# Patient Record
Sex: Female | Born: 1960 | Race: Black or African American | Hispanic: No | Marital: Single | Smoking: Former smoker
Health system: Southern US, Community
[De-identification: ages and names within clinical notes are randomized; demographics above are authoritative.]

## PROBLEM LIST (undated history)

## (undated) DIAGNOSIS — N1330 Unspecified hydronephrosis: Secondary | ICD-10-CM

## (undated) DIAGNOSIS — T1490XA Injury, unspecified, initial encounter: Secondary | ICD-10-CM

## (undated) DIAGNOSIS — I10 Essential (primary) hypertension: Secondary | ICD-10-CM

## (undated) DIAGNOSIS — N182 Chronic kidney disease, stage 2 (mild): Secondary | ICD-10-CM

## (undated) DIAGNOSIS — N179 Acute kidney failure, unspecified: Secondary | ICD-10-CM

## (undated) DIAGNOSIS — E785 Hyperlipidemia, unspecified: Secondary | ICD-10-CM

## (undated) DIAGNOSIS — E119 Type 2 diabetes mellitus without complications: Secondary | ICD-10-CM

## (undated) DIAGNOSIS — S81801A Unspecified open wound, right lower leg, initial encounter: Secondary | ICD-10-CM

## (undated) DIAGNOSIS — N183 Chronic kidney disease, stage 3 unspecified: Secondary | ICD-10-CM

## (undated) HISTORY — DX: Acute kidney failure, unspecified: N17.9

## (undated) HISTORY — DX: Type 2 diabetes mellitus without complications: E11.9

## (undated) HISTORY — DX: Essential (primary) hypertension: I10

## (undated) HISTORY — DX: Chronic kidney disease, stage 2 (mild): N18.2

## (undated) HISTORY — DX: Hyperlipidemia, unspecified: E78.5

## (undated) HISTORY — PX: TUBAL LIGATION: SHX77

---

## 1992-06-01 DIAGNOSIS — E1110 Type 2 diabetes mellitus with ketoacidosis without coma: Secondary | ICD-10-CM | POA: Insufficient documentation

## 1997-06-25 ENCOUNTER — Emergency Department (HOSPITAL_COMMUNITY): Admission: EM | Admit: 1997-06-25 | Discharge: 1997-06-25 | Payer: Self-pay | Admitting: Emergency Medicine

## 1997-07-25 ENCOUNTER — Encounter: Admission: RE | Admit: 1997-07-25 | Discharge: 1997-07-25 | Payer: Self-pay | Admitting: Hematology and Oncology

## 1997-07-27 ENCOUNTER — Encounter: Admission: RE | Admit: 1997-07-27 | Discharge: 1997-07-27 | Payer: Self-pay | Admitting: Obstetrics

## 1997-07-27 ENCOUNTER — Other Ambulatory Visit: Admission: RE | Admit: 1997-07-27 | Discharge: 1997-07-27 | Payer: Self-pay | Admitting: Obstetrics

## 1997-09-25 ENCOUNTER — Encounter: Admission: RE | Admit: 1997-09-25 | Discharge: 1997-09-25 | Payer: Self-pay | Admitting: Internal Medicine

## 1997-10-17 ENCOUNTER — Encounter: Admission: RE | Admit: 1997-10-17 | Discharge: 1997-10-17 | Payer: Self-pay | Admitting: Hematology and Oncology

## 1997-10-30 ENCOUNTER — Encounter: Admission: RE | Admit: 1997-10-30 | Discharge: 1997-10-30 | Payer: Self-pay | Admitting: Internal Medicine

## 1997-11-08 ENCOUNTER — Encounter: Admission: RE | Admit: 1997-11-08 | Discharge: 1997-11-08 | Payer: Self-pay | Admitting: Internal Medicine

## 1997-12-08 ENCOUNTER — Encounter: Admission: RE | Admit: 1997-12-08 | Discharge: 1997-12-08 | Payer: Self-pay | Admitting: Internal Medicine

## 1997-12-08 ENCOUNTER — Encounter: Payer: Self-pay | Admitting: Internal Medicine

## 1998-01-17 ENCOUNTER — Ambulatory Visit (HOSPITAL_COMMUNITY): Admission: RE | Admit: 1998-01-17 | Discharge: 1998-01-17 | Payer: Self-pay | Admitting: Internal Medicine

## 1998-01-17 ENCOUNTER — Encounter: Admission: RE | Admit: 1998-01-17 | Discharge: 1998-01-17 | Payer: Self-pay | Admitting: Internal Medicine

## 1998-02-14 ENCOUNTER — Encounter: Admission: RE | Admit: 1998-02-14 | Discharge: 1998-02-14 | Payer: Self-pay | Admitting: *Deleted

## 1998-03-02 ENCOUNTER — Encounter: Admission: RE | Admit: 1998-03-02 | Discharge: 1998-03-02 | Payer: Self-pay | Admitting: Internal Medicine

## 1998-04-02 ENCOUNTER — Encounter: Admission: RE | Admit: 1998-04-02 | Discharge: 1998-04-02 | Payer: Self-pay | Admitting: Internal Medicine

## 1998-05-04 ENCOUNTER — Encounter: Admission: RE | Admit: 1998-05-04 | Discharge: 1998-05-04 | Payer: Self-pay | Admitting: Internal Medicine

## 1998-06-21 ENCOUNTER — Encounter: Admission: RE | Admit: 1998-06-21 | Discharge: 1998-06-21 | Payer: Self-pay | Admitting: Hematology and Oncology

## 1998-07-26 ENCOUNTER — Other Ambulatory Visit: Admission: RE | Admit: 1998-07-26 | Discharge: 1998-07-26 | Payer: Self-pay | Admitting: *Deleted

## 1998-07-26 ENCOUNTER — Encounter: Admission: RE | Admit: 1998-07-26 | Discharge: 1998-07-26 | Payer: Self-pay | Admitting: Internal Medicine

## 1998-09-13 ENCOUNTER — Encounter: Admission: RE | Admit: 1998-09-13 | Discharge: 1998-09-13 | Payer: Self-pay | Admitting: Hematology and Oncology

## 1998-10-19 ENCOUNTER — Encounter: Admission: RE | Admit: 1998-10-19 | Discharge: 1998-10-19 | Payer: Self-pay | Admitting: Internal Medicine

## 1998-11-13 ENCOUNTER — Encounter: Admission: RE | Admit: 1998-11-13 | Discharge: 1998-11-13 | Payer: Self-pay | Admitting: Hematology and Oncology

## 1998-11-14 ENCOUNTER — Ambulatory Visit (HOSPITAL_COMMUNITY): Admission: RE | Admit: 1998-11-14 | Discharge: 1998-11-14 | Payer: Self-pay | Admitting: *Deleted

## 1998-12-18 ENCOUNTER — Encounter: Admission: RE | Admit: 1998-12-18 | Discharge: 1998-12-18 | Payer: Self-pay | Admitting: Hematology and Oncology

## 1998-12-21 ENCOUNTER — Encounter: Admission: RE | Admit: 1998-12-21 | Discharge: 1998-12-21 | Payer: Self-pay | Admitting: Internal Medicine

## 1999-02-26 ENCOUNTER — Encounter: Admission: RE | Admit: 1999-02-26 | Discharge: 1999-02-26 | Payer: Self-pay | Admitting: Internal Medicine

## 1999-04-01 ENCOUNTER — Encounter: Admission: RE | Admit: 1999-04-01 | Discharge: 1999-04-01 | Payer: Self-pay | Admitting: Internal Medicine

## 1999-04-19 ENCOUNTER — Encounter: Admission: RE | Admit: 1999-04-19 | Discharge: 1999-04-19 | Payer: Self-pay | Admitting: Internal Medicine

## 1999-05-26 ENCOUNTER — Emergency Department (HOSPITAL_COMMUNITY): Admission: EM | Admit: 1999-05-26 | Discharge: 1999-05-26 | Payer: Self-pay | Admitting: Emergency Medicine

## 1999-06-18 ENCOUNTER — Encounter: Admission: RE | Admit: 1999-06-18 | Discharge: 1999-06-18 | Payer: Self-pay | Admitting: Internal Medicine

## 1999-08-01 ENCOUNTER — Encounter: Admission: RE | Admit: 1999-08-01 | Discharge: 1999-08-01 | Payer: Self-pay | Admitting: Obstetrics

## 1999-11-14 ENCOUNTER — Encounter: Admission: RE | Admit: 1999-11-14 | Discharge: 1999-11-14 | Payer: Self-pay | Admitting: Hematology and Oncology

## 1999-11-20 ENCOUNTER — Ambulatory Visit (HOSPITAL_COMMUNITY): Admission: RE | Admit: 1999-11-20 | Discharge: 1999-11-20 | Payer: Self-pay | Admitting: Obstetrics

## 2000-01-23 ENCOUNTER — Encounter: Admission: RE | Admit: 2000-01-23 | Discharge: 2000-01-23 | Payer: Self-pay | Admitting: Hematology and Oncology

## 2000-01-23 ENCOUNTER — Ambulatory Visit (HOSPITAL_COMMUNITY): Admission: RE | Admit: 2000-01-23 | Discharge: 2000-01-23 | Payer: Self-pay | Admitting: Hematology and Oncology

## 2000-01-27 ENCOUNTER — Encounter: Admission: RE | Admit: 2000-01-27 | Discharge: 2000-01-27 | Payer: Self-pay | Admitting: Hematology and Oncology

## 2000-02-20 ENCOUNTER — Encounter: Admission: RE | Admit: 2000-02-20 | Discharge: 2000-02-20 | Payer: Self-pay | Admitting: Hematology and Oncology

## 2000-04-30 ENCOUNTER — Encounter: Admission: RE | Admit: 2000-04-30 | Discharge: 2000-04-30 | Payer: Self-pay

## 2000-05-06 ENCOUNTER — Encounter: Admission: RE | Admit: 2000-05-06 | Discharge: 2000-08-04 | Payer: Self-pay | Admitting: Internal Medicine

## 2000-05-29 ENCOUNTER — Encounter: Admission: RE | Admit: 2000-05-29 | Discharge: 2000-05-29 | Payer: Self-pay

## 2000-06-11 ENCOUNTER — Encounter: Admission: RE | Admit: 2000-06-11 | Discharge: 2000-06-11 | Payer: Self-pay | Admitting: Obstetrics

## 2000-06-23 ENCOUNTER — Emergency Department (HOSPITAL_COMMUNITY): Admission: EM | Admit: 2000-06-23 | Discharge: 2000-06-23 | Payer: Self-pay | Admitting: Emergency Medicine

## 2000-07-07 ENCOUNTER — Encounter: Admission: RE | Admit: 2000-07-07 | Discharge: 2000-07-07 | Payer: Self-pay | Admitting: Hematology and Oncology

## 2000-07-30 ENCOUNTER — Encounter: Admission: RE | Admit: 2000-07-30 | Discharge: 2000-07-30 | Payer: Self-pay | Admitting: Hematology and Oncology

## 2000-08-27 ENCOUNTER — Encounter: Admission: RE | Admit: 2000-08-27 | Discharge: 2000-08-27 | Payer: Self-pay | Admitting: Internal Medicine

## 2000-09-21 ENCOUNTER — Encounter: Admission: RE | Admit: 2000-09-21 | Discharge: 2000-09-21 | Payer: Self-pay | Admitting: Internal Medicine

## 2000-10-21 ENCOUNTER — Encounter: Admission: RE | Admit: 2000-10-21 | Discharge: 2000-10-21 | Payer: Self-pay | Admitting: Internal Medicine

## 2001-01-01 ENCOUNTER — Encounter: Admission: RE | Admit: 2001-01-01 | Discharge: 2001-01-01 | Payer: Self-pay | Admitting: Internal Medicine

## 2001-01-19 ENCOUNTER — Ambulatory Visit (HOSPITAL_COMMUNITY): Admission: RE | Admit: 2001-01-19 | Discharge: 2001-01-19 | Payer: Self-pay | Admitting: Internal Medicine

## 2001-04-12 ENCOUNTER — Encounter: Admission: RE | Admit: 2001-04-12 | Discharge: 2001-04-12 | Payer: Self-pay

## 2001-07-12 ENCOUNTER — Encounter: Admission: RE | Admit: 2001-07-12 | Discharge: 2001-07-12 | Payer: Self-pay | Admitting: Internal Medicine

## 2001-07-27 ENCOUNTER — Encounter: Admission: RE | Admit: 2001-07-27 | Discharge: 2001-10-25 | Payer: Self-pay | Admitting: Internal Medicine

## 2001-10-04 ENCOUNTER — Encounter: Admission: RE | Admit: 2001-10-04 | Discharge: 2001-10-04 | Payer: Self-pay | Admitting: Internal Medicine

## 2002-01-03 ENCOUNTER — Encounter: Admission: RE | Admit: 2002-01-03 | Discharge: 2002-01-03 | Payer: Self-pay | Admitting: Internal Medicine

## 2002-01-18 ENCOUNTER — Ambulatory Visit (HOSPITAL_COMMUNITY): Admission: RE | Admit: 2002-01-18 | Discharge: 2002-01-18 | Payer: Self-pay | Admitting: Internal Medicine

## 2002-01-18 ENCOUNTER — Encounter: Admission: RE | Admit: 2002-01-18 | Discharge: 2002-01-18 | Payer: Self-pay | Admitting: Internal Medicine

## 2002-03-14 ENCOUNTER — Encounter: Admission: RE | Admit: 2002-03-14 | Discharge: 2002-03-14 | Payer: Self-pay | Admitting: Internal Medicine

## 2002-04-25 ENCOUNTER — Other Ambulatory Visit: Admission: RE | Admit: 2002-04-25 | Discharge: 2002-04-25 | Payer: Self-pay | Admitting: Internal Medicine

## 2002-04-25 ENCOUNTER — Encounter: Admission: RE | Admit: 2002-04-25 | Discharge: 2002-04-25 | Payer: Self-pay | Admitting: Internal Medicine

## 2002-08-09 ENCOUNTER — Encounter: Admission: RE | Admit: 2002-08-09 | Discharge: 2002-08-09 | Payer: Self-pay | Admitting: Internal Medicine

## 2002-10-04 ENCOUNTER — Encounter: Admission: RE | Admit: 2002-10-04 | Discharge: 2002-10-04 | Payer: Self-pay | Admitting: Internal Medicine

## 2002-12-06 ENCOUNTER — Encounter: Admission: RE | Admit: 2002-12-06 | Discharge: 2002-12-06 | Payer: Self-pay | Admitting: Internal Medicine

## 2003-02-21 ENCOUNTER — Ambulatory Visit (HOSPITAL_COMMUNITY): Admission: RE | Admit: 2003-02-21 | Discharge: 2003-02-21 | Payer: Self-pay | Admitting: Internal Medicine

## 2003-04-03 ENCOUNTER — Encounter: Admission: RE | Admit: 2003-04-03 | Discharge: 2003-04-03 | Payer: Self-pay | Admitting: Internal Medicine

## 2003-06-20 ENCOUNTER — Encounter: Admission: RE | Admit: 2003-06-20 | Discharge: 2003-06-20 | Payer: Self-pay | Admitting: Internal Medicine

## 2003-07-03 ENCOUNTER — Encounter: Admission: RE | Admit: 2003-07-03 | Discharge: 2003-07-03 | Payer: Self-pay | Admitting: Internal Medicine

## 2003-10-02 ENCOUNTER — Encounter: Admission: RE | Admit: 2003-10-02 | Discharge: 2003-10-02 | Payer: Self-pay | Admitting: Internal Medicine

## 2004-02-16 ENCOUNTER — Ambulatory Visit: Payer: Self-pay

## 2004-02-27 ENCOUNTER — Ambulatory Visit (HOSPITAL_COMMUNITY): Admission: RE | Admit: 2004-02-27 | Discharge: 2004-02-27 | Payer: Self-pay | Admitting: Internal Medicine

## 2004-05-06 ENCOUNTER — Ambulatory Visit: Payer: Self-pay | Admitting: Internal Medicine

## 2004-05-21 ENCOUNTER — Ambulatory Visit: Payer: Self-pay | Admitting: Internal Medicine

## 2004-06-04 ENCOUNTER — Ambulatory Visit: Payer: Self-pay | Admitting: Internal Medicine

## 2004-07-16 ENCOUNTER — Ambulatory Visit: Payer: Self-pay | Admitting: Internal Medicine

## 2004-08-30 ENCOUNTER — Ambulatory Visit: Payer: Self-pay | Admitting: Internal Medicine

## 2004-10-31 ENCOUNTER — Ambulatory Visit: Payer: Self-pay | Admitting: Internal Medicine

## 2004-11-14 ENCOUNTER — Ambulatory Visit (HOSPITAL_COMMUNITY): Admission: RE | Admit: 2004-11-14 | Discharge: 2004-11-14 | Payer: Self-pay | Admitting: Internal Medicine

## 2004-12-19 ENCOUNTER — Ambulatory Visit: Payer: Self-pay | Admitting: Hospitalist

## 2004-12-31 ENCOUNTER — Ambulatory Visit: Payer: Self-pay | Admitting: Cardiology

## 2005-01-17 ENCOUNTER — Ambulatory Visit (HOSPITAL_COMMUNITY): Admission: RE | Admit: 2005-01-17 | Discharge: 2005-01-17 | Payer: Self-pay | Admitting: Cardiology

## 2005-01-17 ENCOUNTER — Ambulatory Visit: Payer: Self-pay | Admitting: Cardiology

## 2005-01-30 ENCOUNTER — Ambulatory Visit: Payer: Self-pay | Admitting: Internal Medicine

## 2005-02-13 ENCOUNTER — Ambulatory Visit: Payer: Self-pay | Admitting: Internal Medicine

## 2005-04-01 ENCOUNTER — Ambulatory Visit (HOSPITAL_COMMUNITY): Admission: RE | Admit: 2005-04-01 | Discharge: 2005-04-01 | Payer: Self-pay | Admitting: Internal Medicine

## 2005-06-05 ENCOUNTER — Ambulatory Visit: Payer: Self-pay | Admitting: Internal Medicine

## 2005-09-01 ENCOUNTER — Ambulatory Visit: Payer: Self-pay | Admitting: Internal Medicine

## 2005-09-03 LAB — CONVERTED CEMR LAB

## 2005-11-11 ENCOUNTER — Ambulatory Visit: Payer: Self-pay | Admitting: Hospitalist

## 2005-11-21 ENCOUNTER — Ambulatory Visit: Payer: Self-pay | Admitting: Hospitalist

## 2005-12-02 ENCOUNTER — Ambulatory Visit: Payer: Self-pay | Admitting: Hospitalist

## 2005-12-18 ENCOUNTER — Ambulatory Visit: Payer: Self-pay | Admitting: Internal Medicine

## 2006-01-06 ENCOUNTER — Ambulatory Visit: Payer: Self-pay | Admitting: Internal Medicine

## 2006-01-13 ENCOUNTER — Encounter (INDEPENDENT_AMBULATORY_CARE_PROVIDER_SITE_OTHER): Payer: Self-pay | Admitting: Internal Medicine

## 2006-01-13 ENCOUNTER — Ambulatory Visit: Payer: Self-pay | Admitting: Internal Medicine

## 2006-01-13 LAB — CONVERTED CEMR LAB
BUN: 12 mg/dL (ref 6–23)
CO2: 23 meq/L (ref 19–32)
Calcium: 9.2 mg/dL (ref 8.4–10.5)
Chloride: 104 meq/L (ref 96–112)
Creatinine, Ser: 0.82 mg/dL (ref 0.40–1.20)
Glucose, Bld: 81 mg/dL (ref 70–99)
Potassium: 3.8 meq/L (ref 3.5–5.3)
Sodium: 141 meq/L (ref 135–145)

## 2006-02-23 ENCOUNTER — Ambulatory Visit: Payer: Self-pay | Admitting: Internal Medicine

## 2006-04-07 ENCOUNTER — Ambulatory Visit (HOSPITAL_COMMUNITY): Admission: RE | Admit: 2006-04-07 | Discharge: 2006-04-07 | Payer: Self-pay | Admitting: Internal Medicine

## 2006-04-21 ENCOUNTER — Emergency Department (HOSPITAL_COMMUNITY): Admission: EM | Admit: 2006-04-21 | Discharge: 2006-04-21 | Payer: Self-pay | Admitting: Emergency Medicine

## 2006-06-02 ENCOUNTER — Encounter (INDEPENDENT_AMBULATORY_CARE_PROVIDER_SITE_OTHER): Payer: Self-pay | Admitting: Internal Medicine

## 2006-06-02 ENCOUNTER — Ambulatory Visit: Payer: Self-pay | Admitting: Internal Medicine

## 2006-06-02 DIAGNOSIS — E785 Hyperlipidemia, unspecified: Secondary | ICD-10-CM

## 2006-06-02 DIAGNOSIS — E119 Type 2 diabetes mellitus without complications: Secondary | ICD-10-CM

## 2006-06-02 DIAGNOSIS — I1A0 Resistant hypertension: Secondary | ICD-10-CM | POA: Insufficient documentation

## 2006-06-02 DIAGNOSIS — I10 Essential (primary) hypertension: Secondary | ICD-10-CM | POA: Insufficient documentation

## 2006-06-02 DIAGNOSIS — E669 Obesity, unspecified: Secondary | ICD-10-CM | POA: Insufficient documentation

## 2006-06-02 HISTORY — DX: Hyperlipidemia, unspecified: E78.5

## 2006-06-02 HISTORY — DX: Type 2 diabetes mellitus without complications: E11.9

## 2006-06-02 HISTORY — DX: Essential (primary) hypertension: I10

## 2006-06-02 LAB — CONVERTED CEMR LAB
BUN: 14 mg/dL (ref 6–23)
Blood Glucose, Fingerstick: 139
CO2: 22 meq/L (ref 19–32)
Calcium: 9.3 mg/dL (ref 8.4–10.5)
Chloride: 104 meq/L (ref 96–112)
Creatinine, Ser: 0.79 mg/dL (ref 0.40–1.20)
Glucose, Bld: 103 mg/dL — ABNORMAL HIGH (ref 70–99)
Hgb A1c MFr Bld: 7.5 %
Potassium: 4.3 meq/L (ref 3.5–5.3)
Sodium: 139 meq/L (ref 135–145)

## 2006-06-16 ENCOUNTER — Ambulatory Visit: Payer: Self-pay | Admitting: *Deleted

## 2006-07-01 ENCOUNTER — Encounter (INDEPENDENT_AMBULATORY_CARE_PROVIDER_SITE_OTHER): Payer: Self-pay | Admitting: Internal Medicine

## 2006-07-01 ENCOUNTER — Ambulatory Visit: Payer: Self-pay | Admitting: Internal Medicine

## 2006-07-03 ENCOUNTER — Telehealth (INDEPENDENT_AMBULATORY_CARE_PROVIDER_SITE_OTHER): Payer: Self-pay | Admitting: *Deleted

## 2006-07-07 ENCOUNTER — Ambulatory Visit: Payer: Self-pay | Admitting: Hospitalist

## 2006-07-15 ENCOUNTER — Telehealth (INDEPENDENT_AMBULATORY_CARE_PROVIDER_SITE_OTHER): Payer: Self-pay | Admitting: Pharmacy Technician

## 2006-07-23 ENCOUNTER — Encounter (INDEPENDENT_AMBULATORY_CARE_PROVIDER_SITE_OTHER): Payer: Self-pay | Admitting: Internal Medicine

## 2006-08-07 ENCOUNTER — Telehealth (INDEPENDENT_AMBULATORY_CARE_PROVIDER_SITE_OTHER): Payer: Self-pay | Admitting: Hospitalist

## 2006-08-12 ENCOUNTER — Ambulatory Visit: Payer: Self-pay | Admitting: Internal Medicine

## 2006-08-12 ENCOUNTER — Telehealth (INDEPENDENT_AMBULATORY_CARE_PROVIDER_SITE_OTHER): Payer: Self-pay | Admitting: Pharmacy Technician

## 2006-08-12 ENCOUNTER — Encounter (INDEPENDENT_AMBULATORY_CARE_PROVIDER_SITE_OTHER): Payer: Self-pay | Admitting: Internal Medicine

## 2006-08-12 LAB — CONVERTED CEMR LAB: Blood Glucose, Fingerstick: 197

## 2006-09-29 ENCOUNTER — Ambulatory Visit: Payer: Self-pay | Admitting: *Deleted

## 2006-09-29 ENCOUNTER — Encounter (INDEPENDENT_AMBULATORY_CARE_PROVIDER_SITE_OTHER): Payer: Self-pay | Admitting: Internal Medicine

## 2006-09-29 LAB — CONVERTED CEMR LAB
Blood Glucose, AC Bkfst: 79 mg/dL
Hgb A1c MFr Bld: 9.1 %

## 2006-09-30 LAB — CONVERTED CEMR LAB
ALT: 16 units/L (ref 0–35)
AST: 17 units/L (ref 0–37)
Albumin: 4.3 g/dL (ref 3.5–5.2)
Alkaline Phosphatase: 91 units/L (ref 39–117)
BUN: 14 mg/dL (ref 6–23)
CO2: 23 meq/L (ref 19–32)
Calcium: 9.5 mg/dL (ref 8.4–10.5)
Chloride: 106 meq/L (ref 96–112)
Cholesterol: 138 mg/dL (ref 0–200)
Cortisol, Plasma: 22.1 ug/dL
Creatinine, Ser: 0.82 mg/dL (ref 0.40–1.20)
Glucose, Bld: 70 mg/dL (ref 70–99)
HDL: 49 mg/dL (ref >39)
LDL Cholesterol: 71 mg/dL (ref 0–99)
Potassium: 4.4 meq/L (ref 3.5–5.3)
Sodium: 141 meq/L (ref 135–145)
Total Bilirubin: 0.5 mg/dL (ref 0.3–1.2)
Total CHOL/HDL Ratio: 2.8
Total Protein: 7.1 g/dL (ref 6.0–8.3)
Triglycerides: 92 mg/dL (ref <150)
VLDL: 18 mg/dL (ref 0–40)

## 2006-10-20 ENCOUNTER — Telehealth (INDEPENDENT_AMBULATORY_CARE_PROVIDER_SITE_OTHER): Payer: Self-pay | Admitting: Pharmacy Technician

## 2006-10-29 ENCOUNTER — Telehealth (INDEPENDENT_AMBULATORY_CARE_PROVIDER_SITE_OTHER): Payer: Self-pay | Admitting: Pharmacy Technician

## 2006-11-03 ENCOUNTER — Telehealth (INDEPENDENT_AMBULATORY_CARE_PROVIDER_SITE_OTHER): Payer: Self-pay | Admitting: *Deleted

## 2006-11-03 ENCOUNTER — Ambulatory Visit: Payer: Self-pay | Admitting: Internal Medicine

## 2006-11-03 LAB — CONVERTED CEMR LAB: Blood Glucose, Home Monitor: 3 mg/dL

## 2006-11-23 ENCOUNTER — Telehealth (INDEPENDENT_AMBULATORY_CARE_PROVIDER_SITE_OTHER): Payer: Self-pay | Admitting: Pharmacy Technician

## 2006-12-17 ENCOUNTER — Ambulatory Visit: Payer: Self-pay | Admitting: Internal Medicine

## 2006-12-17 ENCOUNTER — Telehealth (INDEPENDENT_AMBULATORY_CARE_PROVIDER_SITE_OTHER): Payer: Self-pay | Admitting: Hospitalist

## 2006-12-17 ENCOUNTER — Encounter (INDEPENDENT_AMBULATORY_CARE_PROVIDER_SITE_OTHER): Payer: Self-pay | Admitting: Internal Medicine

## 2006-12-17 LAB — CONVERTED CEMR LAB
Blood Glucose, AC Bkfst: 96 mg/dL
Hgb A1c MFr Bld: 8.8 %

## 2006-12-18 LAB — CONVERTED CEMR LAB: Cortisol, Plasma: 24.4 ug/dL

## 2006-12-22 ENCOUNTER — Ambulatory Visit: Payer: Self-pay | Admitting: Hospitalist

## 2006-12-28 ENCOUNTER — Telehealth (INDEPENDENT_AMBULATORY_CARE_PROVIDER_SITE_OTHER): Payer: Self-pay | Admitting: Internal Medicine

## 2007-01-19 ENCOUNTER — Ambulatory Visit: Payer: Self-pay | Admitting: Internal Medicine

## 2007-02-17 ENCOUNTER — Telehealth (INDEPENDENT_AMBULATORY_CARE_PROVIDER_SITE_OTHER): Payer: Self-pay | Admitting: Internal Medicine

## 2007-03-23 ENCOUNTER — Ambulatory Visit: Payer: Self-pay | Admitting: Infectious Disease

## 2007-03-23 ENCOUNTER — Encounter (INDEPENDENT_AMBULATORY_CARE_PROVIDER_SITE_OTHER): Payer: Self-pay | Admitting: Internal Medicine

## 2007-03-23 LAB — CONVERTED CEMR LAB
Blood Glucose, Fingerstick: 168
Hgb A1c MFr Bld: 8.1 %

## 2007-03-25 ENCOUNTER — Telehealth (INDEPENDENT_AMBULATORY_CARE_PROVIDER_SITE_OTHER): Payer: Self-pay | Admitting: *Deleted

## 2007-04-12 ENCOUNTER — Telehealth (INDEPENDENT_AMBULATORY_CARE_PROVIDER_SITE_OTHER): Payer: Self-pay | Admitting: Internal Medicine

## 2007-04-13 ENCOUNTER — Ambulatory Visit (HOSPITAL_COMMUNITY): Admission: RE | Admit: 2007-04-13 | Discharge: 2007-04-13 | Payer: Self-pay | Admitting: Internal Medicine

## 2007-06-01 ENCOUNTER — Encounter (INDEPENDENT_AMBULATORY_CARE_PROVIDER_SITE_OTHER): Payer: Self-pay | Admitting: Internal Medicine

## 2007-06-03 ENCOUNTER — Telehealth (INDEPENDENT_AMBULATORY_CARE_PROVIDER_SITE_OTHER): Payer: Self-pay | Admitting: Internal Medicine

## 2007-06-30 ENCOUNTER — Telehealth (INDEPENDENT_AMBULATORY_CARE_PROVIDER_SITE_OTHER): Payer: Self-pay | Admitting: Internal Medicine

## 2007-07-28 ENCOUNTER — Telehealth (INDEPENDENT_AMBULATORY_CARE_PROVIDER_SITE_OTHER): Payer: Self-pay | Admitting: *Deleted

## 2007-08-03 ENCOUNTER — Ambulatory Visit: Payer: Self-pay | Admitting: Internal Medicine

## 2007-08-03 ENCOUNTER — Encounter (INDEPENDENT_AMBULATORY_CARE_PROVIDER_SITE_OTHER): Payer: Self-pay | Admitting: Internal Medicine

## 2007-08-03 LAB — CONVERTED CEMR LAB: Hgb A1c MFr Bld: 8.8 %

## 2007-09-13 ENCOUNTER — Telehealth (INDEPENDENT_AMBULATORY_CARE_PROVIDER_SITE_OTHER): Payer: Self-pay | Admitting: *Deleted

## 2007-09-21 ENCOUNTER — Encounter (INDEPENDENT_AMBULATORY_CARE_PROVIDER_SITE_OTHER): Payer: Self-pay | Admitting: Internal Medicine

## 2007-10-22 ENCOUNTER — Telehealth (INDEPENDENT_AMBULATORY_CARE_PROVIDER_SITE_OTHER): Payer: Self-pay | Admitting: Internal Medicine

## 2007-10-26 ENCOUNTER — Telehealth (INDEPENDENT_AMBULATORY_CARE_PROVIDER_SITE_OTHER): Payer: Self-pay | Admitting: Pharmacy Technician

## 2007-11-05 ENCOUNTER — Ambulatory Visit: Payer: Self-pay | Admitting: Internal Medicine

## 2007-11-05 ENCOUNTER — Encounter (INDEPENDENT_AMBULATORY_CARE_PROVIDER_SITE_OTHER): Payer: Self-pay | Admitting: Internal Medicine

## 2007-11-05 LAB — CONVERTED CEMR LAB
ALT: 14 units/L (ref 0–35)
AST: 15 units/L (ref 0–37)
Albumin: 3.9 g/dL (ref 3.5–5.2)
Alkaline Phosphatase: 75 units/L (ref 39–117)
BUN: 20 mg/dL (ref 6–23)
Basophils Absolute: 0 10*3/uL (ref 0.0–0.1)
Basophils Relative: 1 % (ref 0–1)
Blood Glucose, Fingerstick: 176
CO2: 22 meq/L (ref 19–32)
Calcium: 9 mg/dL (ref 8.4–10.5)
Chloride: 102 meq/L (ref 96–112)
Cholesterol: 127 mg/dL (ref 0–200)
Creatinine, Ser: 0.92 mg/dL (ref 0.40–1.20)
Eosinophils Absolute: 0.5 10*3/uL (ref 0.0–0.7)
Eosinophils Relative: 6 % — ABNORMAL HIGH (ref 0–5)
Glucose, Bld: 162 mg/dL — ABNORMAL HIGH (ref 70–99)
HCT: 37.5 % (ref 36.0–46.0)
HDL: 42 mg/dL (ref >39)
Hemoglobin: 12.8 g/dL (ref 12.0–15.0)
Hgb A1c MFr Bld: 10.2 %
LDL Cholesterol: 61 mg/dL (ref 0–99)
Lymphocytes Relative: 26 % (ref 12–46)
Lymphs Abs: 2.3 10*3/uL (ref 0.7–4.0)
MCHC: 34.1 g/dL (ref 30.0–36.0)
MCV: 87.4 fL (ref 78.0–100.0)
Monocytes Absolute: 0.6 10*3/uL (ref 0.1–1.0)
Monocytes Relative: 7 % (ref 3–12)
Neutro Abs: 5.4 10*3/uL (ref 1.7–7.7)
Neutrophils Relative %: 61 % (ref 43–77)
Platelets: 336 10*3/uL (ref 150–400)
Potassium: 4.3 meq/L (ref 3.5–5.3)
RBC: 4.29 M/uL (ref 3.87–5.11)
RDW: 13.2 % (ref 11.5–15.5)
Sodium: 138 meq/L (ref 135–145)
TSH: 1.077 microintl units/mL (ref 0.350–4.50)
Total Bilirubin: 0.6 mg/dL (ref 0.3–1.2)
Total CHOL/HDL Ratio: 3
Total Protein: 6.3 g/dL (ref 6.0–8.3)
Triglycerides: 120 mg/dL (ref <150)
VLDL: 24 mg/dL (ref 0–40)
WBC: 8.8 10*3/uL (ref 4.0–10.5)

## 2007-12-15 ENCOUNTER — Telehealth (INDEPENDENT_AMBULATORY_CARE_PROVIDER_SITE_OTHER): Payer: Self-pay | Admitting: *Deleted

## 2007-12-22 ENCOUNTER — Encounter (INDEPENDENT_AMBULATORY_CARE_PROVIDER_SITE_OTHER): Payer: Self-pay | Admitting: Internal Medicine

## 2007-12-22 ENCOUNTER — Ambulatory Visit: Payer: Self-pay | Admitting: Infectious Disease

## 2007-12-22 DIAGNOSIS — N926 Irregular menstruation, unspecified: Secondary | ICD-10-CM | POA: Insufficient documentation

## 2007-12-22 LAB — CONVERTED CEMR LAB
Beta hcg, urine, semiquantitative: NEGATIVE
Blood Glucose, Fingerstick: 136
Blood Glucose, Home Monitor: 2 mg/dL
Hgb A1c MFr Bld: 9.1 %

## 2007-12-23 LAB — CONVERTED CEMR LAB
ALT: 19 units/L (ref 0–35)
AST: 24 units/L (ref 0–37)
Albumin: 4 g/dL (ref 3.5–5.2)
Alkaline Phosphatase: 73 units/L (ref 39–117)
BUN: 12 mg/dL (ref 6–23)
Basophils Absolute: 0 10*3/uL (ref 0.0–0.1)
Basophils Relative: 0 % (ref 0–1)
CO2: 23 meq/L (ref 19–32)
Calcium: 9.5 mg/dL (ref 8.4–10.5)
Chloride: 103 meq/L (ref 96–112)
Cortisol, Plasma: 18.1 ug/dL
Creatinine, Ser: 0.78 mg/dL (ref 0.40–1.20)
Eosinophils Absolute: 0.6 10*3/uL (ref 0.0–0.7)
Eosinophils Relative: 7 % — ABNORMAL HIGH (ref 0–5)
FSH: 7.3 milliintl units/mL
Ferritin: 21 ng/mL (ref 10–291)
Glucose, Bld: 141 mg/dL — ABNORMAL HIGH (ref 70–99)
HCT: 40.3 % (ref 36.0–46.0)
Hemoglobin: 13.5 g/dL (ref 12.0–15.0)
LH: 3.2 milliintl units/mL
Lymphocytes Relative: 23 % (ref 12–46)
Lymphs Abs: 2.1 10*3/uL (ref 0.7–4.0)
MCHC: 33.5 g/dL (ref 30.0–36.0)
MCV: 89 fL (ref 78.0–100.0)
Monocytes Absolute: 0.6 10*3/uL (ref 0.1–1.0)
Monocytes Relative: 7 % (ref 3–12)
Neutro Abs: 5.8 10*3/uL (ref 1.7–7.7)
Neutrophils Relative %: 64 % (ref 43–77)
Nitrite: POSITIVE — AB
Platelets: 302 10*3/uL (ref 150–400)
Potassium: 4.1 meq/L (ref 3.5–5.3)
Protein, ur: 30 mg/dL — AB
RBC: 4.53 M/uL (ref 3.87–5.11)
RDW: 13.2 % (ref 11.5–15.5)
Sodium: 138 meq/L (ref 135–145)
Specific Gravity, Urine: 1.025 (ref 1.005–1.03)
TSH: 1.015 microintl units/mL (ref 0.350–4.50)
Total Bilirubin: 0.6 mg/dL (ref 0.3–1.2)
Total Protein: 6.5 g/dL (ref 6.0–8.3)
Urine Glucose: NEGATIVE mg/dL
Urobilinogen, UA: 1 (ref 0.0–1.0)
WBC: 9.1 10*3/uL (ref 4.0–10.5)
pH: 6 (ref 5.0–8.0)

## 2007-12-29 ENCOUNTER — Telehealth (INDEPENDENT_AMBULATORY_CARE_PROVIDER_SITE_OTHER): Payer: Self-pay | Admitting: Internal Medicine

## 2008-01-03 ENCOUNTER — Ambulatory Visit (HOSPITAL_COMMUNITY): Admission: RE | Admit: 2008-01-03 | Discharge: 2008-01-03 | Payer: Self-pay | Admitting: Internal Medicine

## 2008-01-05 ENCOUNTER — Ambulatory Visit: Payer: Self-pay | Admitting: Internal Medicine

## 2008-01-05 ENCOUNTER — Encounter (INDEPENDENT_AMBULATORY_CARE_PROVIDER_SITE_OTHER): Payer: Self-pay | Admitting: Internal Medicine

## 2008-01-05 ENCOUNTER — Telehealth (INDEPENDENT_AMBULATORY_CARE_PROVIDER_SITE_OTHER): Payer: Self-pay | Admitting: Internal Medicine

## 2008-01-05 LAB — CONVERTED CEMR LAB: Hgb A1c MFr Bld: 8.9 %

## 2008-01-07 LAB — CONVERTED CEMR LAB
Amphetamine Screen, Ur: NEGATIVE
Barbiturate Quant, Ur: NEGATIVE
Benzodiazepines.: NEGATIVE
Bilirubin Urine: NEGATIVE
Cocaine Metabolites: NEGATIVE
Creatinine, Urine: 75.3 mg/dL
Creatinine,U: 75.9 mg/dL
Hemoglobin, Urine: NEGATIVE
Marijuana Metabolite: NEGATIVE
Methadone: NEGATIVE
Microalb Creat Ratio: 203.2 mg/g — ABNORMAL HIGH (ref 0.0–30.0)
Microalb, Ur: 15.3 mg/dL — ABNORMAL HIGH (ref 0.00–1.89)
Nitrite: NEGATIVE
Opiates: NEGATIVE
Phencyclidine (PCP): NEGATIVE
Propoxyphene: NEGATIVE
Protein, ur: NEGATIVE mg/dL
Specific Gravity, Urine: 1.024 (ref 1.005–1.03)
Urine Glucose: >1000 mg/dL — AB
Urobilinogen, UA: 1 (ref 0.0–1.0)
pH: 6.5 (ref 5.0–8.0)

## 2008-01-14 ENCOUNTER — Telehealth (INDEPENDENT_AMBULATORY_CARE_PROVIDER_SITE_OTHER): Payer: Self-pay | Admitting: *Deleted

## 2008-01-19 ENCOUNTER — Ambulatory Visit: Payer: Self-pay | Admitting: Vascular Surgery

## 2008-01-19 ENCOUNTER — Encounter (INDEPENDENT_AMBULATORY_CARE_PROVIDER_SITE_OTHER): Payer: Self-pay | Admitting: Internal Medicine

## 2008-02-02 LAB — CONVERTED CEMR LAB
Aldosterone, Serum: 6
PRA: 1.5

## 2008-02-07 ENCOUNTER — Encounter (INDEPENDENT_AMBULATORY_CARE_PROVIDER_SITE_OTHER): Payer: Self-pay | Admitting: Internal Medicine

## 2008-02-08 ENCOUNTER — Encounter (INDEPENDENT_AMBULATORY_CARE_PROVIDER_SITE_OTHER): Payer: Self-pay | Admitting: Internal Medicine

## 2008-02-08 ENCOUNTER — Ambulatory Visit: Payer: Self-pay | Admitting: Internal Medicine

## 2008-03-02 ENCOUNTER — Telehealth (INDEPENDENT_AMBULATORY_CARE_PROVIDER_SITE_OTHER): Payer: Self-pay | Admitting: Pharmacy Technician

## 2008-03-15 ENCOUNTER — Telehealth (INDEPENDENT_AMBULATORY_CARE_PROVIDER_SITE_OTHER): Payer: Self-pay | Admitting: Pharmacy Technician

## 2008-04-04 ENCOUNTER — Encounter (INDEPENDENT_AMBULATORY_CARE_PROVIDER_SITE_OTHER): Payer: Self-pay | Admitting: Internal Medicine

## 2008-04-04 ENCOUNTER — Ambulatory Visit: Payer: Self-pay | Admitting: Infectious Disease

## 2008-04-04 LAB — CONVERTED CEMR LAB
Blood Glucose, AC Bkfst: 119 mg/dL
Hgb A1c MFr Bld: 8.6 %

## 2008-04-18 ENCOUNTER — Ambulatory Visit (HOSPITAL_COMMUNITY): Admission: RE | Admit: 2008-04-18 | Discharge: 2008-04-18 | Payer: Self-pay | Admitting: Internal Medicine

## 2008-05-02 ENCOUNTER — Encounter (INDEPENDENT_AMBULATORY_CARE_PROVIDER_SITE_OTHER): Payer: Self-pay | Admitting: Internal Medicine

## 2008-05-02 ENCOUNTER — Ambulatory Visit: Payer: Self-pay | Admitting: Internal Medicine

## 2008-05-02 ENCOUNTER — Telehealth (INDEPENDENT_AMBULATORY_CARE_PROVIDER_SITE_OTHER): Payer: Self-pay | Admitting: Internal Medicine

## 2008-05-02 ENCOUNTER — Telehealth (INDEPENDENT_AMBULATORY_CARE_PROVIDER_SITE_OTHER): Payer: Self-pay | Admitting: *Deleted

## 2008-05-02 LAB — CONVERTED CEMR LAB

## 2008-05-16 ENCOUNTER — Encounter (INDEPENDENT_AMBULATORY_CARE_PROVIDER_SITE_OTHER): Payer: Self-pay | Admitting: Internal Medicine

## 2008-05-16 ENCOUNTER — Ambulatory Visit: Payer: Self-pay | Admitting: Internal Medicine

## 2008-05-17 LAB — CONVERTED CEMR LAB
Candida species: NEGATIVE
Gardnerella vaginalis: POSITIVE — AB
Trichomonal Vaginitis: NEGATIVE

## 2008-05-29 ENCOUNTER — Telehealth (INDEPENDENT_AMBULATORY_CARE_PROVIDER_SITE_OTHER): Payer: Self-pay | Admitting: *Deleted

## 2008-05-29 ENCOUNTER — Telehealth (INDEPENDENT_AMBULATORY_CARE_PROVIDER_SITE_OTHER): Payer: Self-pay | Admitting: Pharmacy Technician

## 2008-06-06 ENCOUNTER — Encounter (INDEPENDENT_AMBULATORY_CARE_PROVIDER_SITE_OTHER): Payer: Self-pay | Admitting: Internal Medicine

## 2008-06-08 ENCOUNTER — Telehealth (INDEPENDENT_AMBULATORY_CARE_PROVIDER_SITE_OTHER): Payer: Self-pay | Admitting: *Deleted

## 2008-06-21 ENCOUNTER — Telehealth (INDEPENDENT_AMBULATORY_CARE_PROVIDER_SITE_OTHER): Payer: Self-pay | Admitting: Internal Medicine

## 2008-07-10 ENCOUNTER — Telehealth (INDEPENDENT_AMBULATORY_CARE_PROVIDER_SITE_OTHER): Payer: Self-pay | Admitting: Internal Medicine

## 2008-07-20 ENCOUNTER — Ambulatory Visit: Payer: Self-pay | Admitting: Internal Medicine

## 2008-07-20 ENCOUNTER — Encounter (INDEPENDENT_AMBULATORY_CARE_PROVIDER_SITE_OTHER): Payer: Self-pay | Admitting: Internal Medicine

## 2008-07-20 LAB — CONVERTED CEMR LAB
ALT: 24 units/L (ref 0–35)
AST: 23 units/L (ref 0–37)
Albumin: 4 g/dL (ref 3.5–5.2)
Alkaline Phosphatase: 65 units/L (ref 39–117)
BUN: 10 mg/dL (ref 6–23)
Blood Glucose, AC Bkfst: 110 mg/dL
Blood Glucose, Fingerstick: 106
CO2: 21 meq/L (ref 19–32)
Calcium: 9 mg/dL (ref 8.4–10.5)
Chloride: 106 meq/L (ref 96–112)
Cholesterol: 120 mg/dL (ref 0–200)
Creatinine, Ser: 0.87 mg/dL (ref 0.40–1.20)
GFR calc Af Amer: >60 mL/min (ref >60)
GFR calc non Af Amer: >60 mL/min (ref >60)
Glucose, Bld: 79 mg/dL (ref 70–99)
HCT: 37.7 % (ref 36.0–46.0)
HDL: 60 mg/dL (ref >39)
Hemoglobin: 12.6 g/dL (ref 12.0–15.0)
Hgb A1c MFr Bld: 7.9 %
LDL Cholesterol: 51 mg/dL (ref 0–99)
MCHC: 33.4 g/dL (ref 30.0–36.0)
MCV: 89.8 fL (ref 78.0–100.0)
Platelets: 257 10*3/uL (ref 150–400)
Potassium: 4.5 meq/L (ref 3.5–5.3)
RBC: 4.2 M/uL (ref 3.87–5.11)
RDW: 13.5 % (ref 11.5–15.5)
Sodium: 140 meq/L (ref 135–145)
Total Bilirubin: 0.4 mg/dL (ref 0.3–1.2)
Total CHOL/HDL Ratio: 2
Total Protein: 6.7 g/dL (ref 6.0–8.3)
Triglycerides: 47 mg/dL (ref <150)
VLDL: 9 mg/dL (ref 0–40)
WBC: 6.5 10*3/uL (ref 4.0–10.5)

## 2008-08-18 ENCOUNTER — Telehealth (INDEPENDENT_AMBULATORY_CARE_PROVIDER_SITE_OTHER): Payer: Self-pay | Admitting: *Deleted

## 2008-09-19 ENCOUNTER — Telehealth (INDEPENDENT_AMBULATORY_CARE_PROVIDER_SITE_OTHER): Payer: Self-pay | Admitting: Internal Medicine

## 2008-09-25 ENCOUNTER — Telehealth (INDEPENDENT_AMBULATORY_CARE_PROVIDER_SITE_OTHER): Payer: Self-pay | Admitting: *Deleted

## 2008-09-26 ENCOUNTER — Telehealth (INDEPENDENT_AMBULATORY_CARE_PROVIDER_SITE_OTHER): Payer: Self-pay | Admitting: Internal Medicine

## 2008-11-01 ENCOUNTER — Telehealth (INDEPENDENT_AMBULATORY_CARE_PROVIDER_SITE_OTHER): Payer: Self-pay | Admitting: Internal Medicine

## 2008-12-06 ENCOUNTER — Ambulatory Visit: Payer: Self-pay | Admitting: Internal Medicine

## 2008-12-06 ENCOUNTER — Encounter (INDEPENDENT_AMBULATORY_CARE_PROVIDER_SITE_OTHER): Payer: Self-pay | Admitting: Internal Medicine

## 2008-12-06 LAB — CONVERTED CEMR LAB
Blood Glucose, Fingerstick: 128
Hgb A1c MFr Bld: 8.1 %

## 2009-01-02 ENCOUNTER — Telehealth (INDEPENDENT_AMBULATORY_CARE_PROVIDER_SITE_OTHER): Payer: Self-pay | Admitting: *Deleted

## 2009-01-15 ENCOUNTER — Telehealth (INDEPENDENT_AMBULATORY_CARE_PROVIDER_SITE_OTHER): Payer: Self-pay | Admitting: *Deleted

## 2009-01-30 ENCOUNTER — Encounter (INDEPENDENT_AMBULATORY_CARE_PROVIDER_SITE_OTHER): Payer: Self-pay | Admitting: Internal Medicine

## 2009-02-06 ENCOUNTER — Telehealth (INDEPENDENT_AMBULATORY_CARE_PROVIDER_SITE_OTHER): Payer: Self-pay | Admitting: *Deleted

## 2009-02-16 ENCOUNTER — Telehealth (INDEPENDENT_AMBULATORY_CARE_PROVIDER_SITE_OTHER): Payer: Self-pay | Admitting: Internal Medicine

## 2009-03-06 ENCOUNTER — Ambulatory Visit: Payer: Self-pay | Admitting: Internal Medicine

## 2009-03-06 ENCOUNTER — Encounter (INDEPENDENT_AMBULATORY_CARE_PROVIDER_SITE_OTHER): Payer: Self-pay | Admitting: Internal Medicine

## 2009-03-06 LAB — CONVERTED CEMR LAB
Blood Glucose, AC Bkfst: 93 mg/dL
Hgb A1c MFr Bld: 8.1 %

## 2009-03-08 LAB — CONVERTED CEMR LAB: Microalb Creat Ratio: 65.1 mg/g — ABNORMAL HIGH (ref 0.0–30.0)

## 2009-04-03 ENCOUNTER — Telehealth: Payer: Self-pay | Admitting: *Deleted

## 2009-04-05 ENCOUNTER — Telehealth (INDEPENDENT_AMBULATORY_CARE_PROVIDER_SITE_OTHER): Payer: Self-pay | Admitting: Internal Medicine

## 2009-04-24 ENCOUNTER — Ambulatory Visit (HOSPITAL_COMMUNITY): Admission: RE | Admit: 2009-04-24 | Discharge: 2009-04-24 | Payer: Self-pay | Admitting: Internal Medicine

## 2009-04-24 LAB — HM MAMMOGRAPHY: HM Mammogram: NEGATIVE

## 2009-06-11 ENCOUNTER — Ambulatory Visit: Payer: Self-pay | Admitting: Infectious Diseases

## 2009-07-03 ENCOUNTER — Ambulatory Visit: Payer: Self-pay | Admitting: Internal Medicine

## 2009-07-10 ENCOUNTER — Ambulatory Visit: Payer: Self-pay | Admitting: Internal Medicine

## 2009-08-14 ENCOUNTER — Ambulatory Visit: Payer: Self-pay | Admitting: Internal Medicine

## 2009-08-14 LAB — CONVERTED CEMR LAB
ALT: 17 units/L (ref 0–35)
Albumin: 4 g/dL (ref 3.5–5.2)
CO2: 20 meq/L (ref 19–32)
Chloride: 105 meq/L (ref 96–112)
Cholesterol: 116 mg/dL (ref 0–200)
Glucose, Bld: 115 mg/dL — ABNORMAL HIGH (ref 70–99)
Potassium: 4.5 meq/L (ref 3.5–5.3)
Sodium: 139 meq/L (ref 135–145)
Total Protein: 6.2 g/dL (ref 6.0–8.3)
Triglycerides: 107 mg/dL (ref <150)

## 2009-08-28 ENCOUNTER — Ambulatory Visit: Payer: Self-pay | Admitting: Internal Medicine

## 2009-09-04 ENCOUNTER — Ambulatory Visit: Payer: Self-pay | Admitting: Obstetrics and Gynecology

## 2009-09-04 ENCOUNTER — Inpatient Hospital Stay (HOSPITAL_COMMUNITY): Admission: AD | Admit: 2009-09-04 | Discharge: 2009-09-04 | Payer: Self-pay | Admitting: Obstetrics & Gynecology

## 2009-09-18 ENCOUNTER — Encounter: Payer: Self-pay | Admitting: Internal Medicine

## 2009-10-04 ENCOUNTER — Other Ambulatory Visit: Admission: RE | Admit: 2009-10-04 | Discharge: 2009-10-04 | Payer: Self-pay | Admitting: Obstetrics and Gynecology

## 2009-10-04 ENCOUNTER — Ambulatory Visit: Payer: Self-pay | Admitting: Obstetrics and Gynecology

## 2009-10-08 ENCOUNTER — Telehealth: Payer: Self-pay | Admitting: Internal Medicine

## 2009-11-02 ENCOUNTER — Ambulatory Visit: Payer: Self-pay | Admitting: Obstetrics & Gynecology

## 2009-11-12 ENCOUNTER — Telehealth: Payer: Self-pay | Admitting: Internal Medicine

## 2009-11-14 ENCOUNTER — Telehealth (INDEPENDENT_AMBULATORY_CARE_PROVIDER_SITE_OTHER): Payer: Self-pay | Admitting: *Deleted

## 2009-11-29 ENCOUNTER — Telehealth: Payer: Self-pay | Admitting: Internal Medicine

## 2009-12-03 ENCOUNTER — Ambulatory Visit: Payer: Self-pay | Admitting: Internal Medicine

## 2009-12-03 LAB — CONVERTED CEMR LAB
Blood Glucose, Fingerstick: 150
Hgb A1c MFr Bld: 7.6 %

## 2009-12-03 LAB — HM PAP SMEAR

## 2009-12-03 LAB — HM DIABETES FOOT EXAM

## 2009-12-05 LAB — CONVERTED CEMR LAB: Pap Smear: NEGATIVE

## 2010-02-05 ENCOUNTER — Ambulatory Visit: Payer: Self-pay | Admitting: Internal Medicine

## 2010-02-05 DIAGNOSIS — K921 Melena: Secondary | ICD-10-CM | POA: Insufficient documentation

## 2010-02-05 LAB — CONVERTED CEMR LAB
Basophils Absolute: 0 10*3/uL (ref 0.0–0.1)
Blood Glucose, AC Bkfst: 129 mg/dL
Calcium: 9.6 mg/dL (ref 8.4–10.5)
Eosinophils Absolute: 0.5 10*3/uL (ref 0.0–0.7)
Eosinophils Relative: 4 % (ref 0–5)
Glucose, Bld: 137 mg/dL — ABNORMAL HIGH (ref 70–99)
Lymphocytes Relative: 21 % (ref 12–46)
Lymphs Abs: 2.2 10*3/uL (ref 0.7–4.0)
MCV: 89.2 fL (ref >=78.0)
Neutrophils Relative %: 68 % (ref 43–77)
Platelets: 300 10*3/uL (ref 150–400)
Potassium: 4.3 meq/L (ref 3.5–5.3)
RDW: 13.2 % (ref 11.5–15.5)
Sodium: 138 meq/L (ref 135–145)
WBC: 10.3 10*3/uL (ref 4.0–10.5)

## 2010-02-12 ENCOUNTER — Ambulatory Visit: Payer: Self-pay | Admitting: Internal Medicine

## 2010-02-12 ENCOUNTER — Encounter: Payer: Self-pay | Admitting: Internal Medicine

## 2010-03-12 ENCOUNTER — Telehealth (INDEPENDENT_AMBULATORY_CARE_PROVIDER_SITE_OTHER): Payer: Self-pay | Admitting: *Deleted

## 2010-03-14 ENCOUNTER — Telehealth: Payer: Self-pay | Admitting: *Deleted

## 2010-03-18 ENCOUNTER — Ambulatory Visit: Payer: Self-pay | Admitting: Internal Medicine

## 2010-04-04 ENCOUNTER — Other Ambulatory Visit (HOSPITAL_COMMUNITY): Payer: Self-pay | Admitting: Internal Medicine

## 2010-04-04 DIAGNOSIS — Z1231 Encounter for screening mammogram for malignant neoplasm of breast: Secondary | ICD-10-CM

## 2010-04-04 DIAGNOSIS — Z0000 Encounter for general adult medical examination without abnormal findings: Secondary | ICD-10-CM

## 2010-04-18 NOTE — Assessment & Plan Note (Signed)
Summary: CHECKUP/SB.   Vital Signs:  Patient profile:   50 year old female Height:      68 inches (172.72 cm) Weight:      260.03 pounds (118.20 kg) BMI:     39.68 Temp:     97.4 degrees F (36.33 degrees C) oral Pulse rate:   85 / minute BP sitting:   183 / 102  (right arm)  Vitals Entered By: Angelina Ok RN (June 11, 2009 9:59 AM) Is Patient Diabetic? Yes Did you bring your meter with you today? Yes Pain Assessment Patient in pain? no      Nutritional Status BMI of > 30 = obese CBG Result 273  Have you ever been in a relationship where you felt threatened, hurt or afraid?No   Does patient need assistance? Functional Status Self care Ambulation Normal Comments Check up.  Needs refills.   Primary Care Provider:  Valetta Close MD   History of Present Illness: 40 yof with pmh outlined in this chart here for DM check up.  Reports no complaints.    1) blood pressure  Has her home cuff.  Most readings are 140-160, a couple lower readings. reading in clinic is significantly higher, but she says that it is always high in the clinic.  No HA, vision change, chest pain, shortness of breath or edema.  2) DM: has been having some lower levels.  Mainly in the mornings, sometimes in the evening if she has missed lunch.  Lows usually just in the 70's occasionally in the 50's.  Depression History:      The patient denies a depressed mood most of the day and a diminished interest in her usual daily activities.         Preventive Screening-Counseling & Management  Alcohol-Tobacco     Alcohol drinks/day: 0     Smoking Status: quit     Year Quit: 1984     Passive Smoke Exposure: no  Current Medications (verified): 1)  Novolin 70/30 70-30 % Susp (Insulin Isophane & Regular) .... Please Take 30 Units in The Morning With Breakfast, and 20 Units At Night With Dinner. It Needs To Be Taken With Food 2)  Glucophage 1000 Mg Tabs (Metformin Hcl) .... Take 1 Tablet By Mouth Two Times A  Day 3)  Lipitor 20 Mg  Tabs (Atorvastatin Calcium) .... Take 1 Tablet By Mouth Once A Day 4)  Lisinopril-Hydrochlorothiazide 20-12.5 Mg Tabs (Lisinopril-Hydrochlorothiazide) .... Take 2 Tabs By Mouth Once Daily 5)  Hydralazine Hcl 25 Mg Tabs (Hydralazine Hcl) .... Take 1 Tablet By Mouth Two Times A Day 6)  Aspirin 81 Mg Tbec (Aspirin) .... Take 1 Tablet By Mouth Once A Day 7)  Labetalol Hcl 200 Mg  Tabs (Labetalol Hcl) .... Take 2 Tabs By Mouth Two Times A Day 8)  Freestyle Lite   Strp (Glucose Blood) .... To Test Blood Sugar Twice Daily 9)  Freestyle Lancets   Misc (Lancets) .... To Test Blood Sugar Twice Daily 10)  Catapres 0.3 Mg Tabs (Clonidine Hcl) .... Take 1 Tablet By Mouth Two Times A Day 11)  Centrum  Tabs (Multiple Vitamins-Minerals)  Allergies (verified): No Known Drug Allergies  Review of Systems       per hpi  Physical Exam  General:  alert and overweight-appearing.  alert.   Head:  normocephalic and atraumatic.   Eyes:  vision grossly intact, pupils equal, pupils round, and pupils reactive to light.   Mouth:  good dentition and pharynx pink and  moist.   Lungs:  normal respiratory effort and normal breath sounds.   Heart:  normal rate, regular rhythm, no murmur, and no gallop.   Abdomen:  soft, non-tender, and normal bowel sounds.   Extremities:  no edema Neurologic:  alert & oriented X3, cranial nerves II-XII intact, strength normal in all extremities, and sensation intact to light touch.     Impression & Recommendations:  Problem # 1:  HYPERTENSION (ICD-401.9) She has readings at home mostly 140-150 on her home meter.  In [facility]  is very high, has always had white coat HTN.  On recheck with lights dimmed, quiet room is still elevated with SBP of >150.  I will increase her hydralazine today to see if this helps get her to goal of <140.  Her updated medication list for this problem includes:    Lisinopril-hydrochlorothiazide 20-12.5 Mg Tabs  (Lisinopril-hydrochlorothiazide) .Marland Kitchen... Take 2 tabs by mouth once daily    Hydralazine Hcl 50 Mg Tabs (Hydralazine hcl) .Marland Kitchen... Take one tablet two times a day for blood pressure.    Labetalol Hcl 200 Mg Tabs (Labetalol hcl) .Marland Kitchen... Take 2 tabs by mouth two times a day    Catapres 0.3 Mg Tabs (Clonidine hcl) .Marland Kitchen... Take 1 tablet by mouth two times a day  BP today: 183/102 Prior BP: 158/94 (03/06/2009)  Labs Reviewed: K+: 4.5 (07/20/2008) Creat: : 0.87 (07/20/2008)   Chol: 120 (07/20/2008)   HDL: 60 (07/20/2008)   LDL: 51 (07/20/2008)   TG: 47 (07/20/2008)  Problem # 2:  DIABETES MELLITUS, TYPE II (ICD-250.00) eye exam tomorrow already scheduled. does not have neuropathy or high risk feet. A1c still 8.0, but cbg's on meter down load look much better than that.   Can not think of a time that she may be spiking. Last visit with DR was 3 months ago... will schedule another appointment. Maybe DR can help [place] figure out why a1C is not going down. I reassured her that readings in the 70's are normal, but below this is worrysome.   Her updated medication list for this problem includes:    Novolin 70/30 70-30 % Susp (Insulin isophane & regular) .Marland Kitchen... Please take 30 units in the morning with breakfast, and 20 units at night with dinner. it needs to be taken with food    Glucophage 1000 Mg Tabs (Metformin hcl) .Marland Kitchen... Take 1 tablet by mouth two times a day    Lisinopril-hydrochlorothiazide 20-12.5 Mg Tabs (Lisinopril-hydrochlorothiazide) .Marland Kitchen... Take 2 tabs by mouth once daily    Aspirin 81 Mg Tbec (Aspirin) .Marland Kitchen... Take 1 tablet by mouth once a day  Orders: T- Capillary Blood Glucose ([address]) T-Hgb A1C (in-house) ([id number]) Ophthalmology Referral (Ophthalmology)  Labs Reviewed: Creat: 0.87 (07/20/2008)     Last Eye Exam: No diabetic retinopathy.   Visual acuity OD (best corrected):     20/20 Visual acuity OS (best corrected):     20/20 Intraocular pressure OD:     12 Intraocular pressure OS:     12   (06/06/2008) Reviewed HgBA1c results: 8.0 (06/11/2009)  8.1 (03/06/2009)  Problem # 3:  HYPERLIPIDEMIA (ICD-272.4) great at last check.  not fasting today, will check next time.  Her updated medication list for this problem includes:    Lipitor 20 Mg Tabs (Atorvastatin calcium) .Marland Kitchen... Take 1 tablet by mouth once a day  Labs Reviewed: SGOT: 23 (07/20/2008)   SGPT: 24 (07/20/2008)   HDL:60 (07/20/2008), 42 (11/05/2007)  LDL:51 (07/20/2008), 61 (11/05/2007)  Chol:120 (07/20/2008), 127 (11/05/2007)  Trig:47 (07/20/2008),  120 (11/05/2007)  Problem # 4:  Preventive Health Care (ICD-V70.0) went to the dentist, will go back in 6 mo has had mam and pap. goes to eye doc tomorrow. no fam hx of colon ca will get colon screen at 52.  Complete Medication List: 1)  Novolin 70/30 70-30 % Susp (Insulin isophane & regular) .... Please take 30 units in the morning with breakfast, and 20 units at night with dinner. it needs to be taken with food 2)  Glucophage 1000 Mg Tabs (Metformin hcl) .... Take 1 tablet by mouth two times a day 3)  Lipitor 20 Mg Tabs (Atorvastatin calcium) .... Take 1 tablet by mouth once a day 4)  Lisinopril-hydrochlorothiazide 20-12.5 Mg Tabs (Lisinopril-hydrochlorothiazide) .... Take 2 tabs by mouth once daily 5)  Hydralazine Hcl 50 Mg Tabs (Hydralazine hcl) .... Take one tablet two times a day for blood pressure. 6)  Aspirin 81 Mg Tbec (Aspirin) .... Take 1 tablet by mouth once a day 7)  Labetalol Hcl 200 Mg Tabs (Labetalol hcl) .... Take 2 tabs by mouth two times a day 8)  Freestyle Lite Strp (Glucose blood) .... To test blood sugar twice daily 9)  Freestyle Lancets Misc (Lancets) .... To test blood sugar twice daily 10)  Catapres 0.3 Mg Tabs (Clonidine hcl) .... Take 1 tablet by mouth two times a day 11)  Centrum Tabs (Multiple vitamins-minerals)  Patient Instructions: 1)  Please schedule a follow-up appointment in 2 weeks. 2)  Please schedule an appointment with Jamison Neighbor. Prescriptions: HYDRALAZINE HCL 50 MG TABS (HYDRALAZINE HCL) Take one tablet two times a day for blood pressure.  #64 x 1   Entered and Authorized by:   Elby Showers MD   Signed by:   Elby Showers MD on 06/11/2009   Method used:   Electronically to        [facility] (retail)       [address]       [place], [place]  [address]       Ph: [phone]       Fax: [phone]   RxID:   [id number]    Vital Signs:  Patient profile:   50 year old female Height:      68 inches (172.72 cm) Weight:      260.03 pounds (118.20 kg) BMI:     39.68 Temp:     97.4 degrees F (36.33 degrees C) oral Pulse rate:   85 / minute BP sitting:   183 / 102  (right arm)  Vitals Entered By: Angelina Ok RN (June 11, 2009 9:59 AM)   Prevention & Chronic Care Immunizations   Influenza vaccine: Fluvax Non-MCR  (12/06/2008)    Tetanus booster: Not documented    Pneumococcal vaccine: Not documented  Other Screening   Pap smear: NEGATIVE FOR INTRAEPITHELIAL LESIONS OR MALIGNANCY.  (05/16/2008)   Pap smear due: 12/07/2011    Mammogram: ASSESSMENT: Negative - BI-RADS 1^MM DIGITAL SCREENING  (04/24/2009)   Mammogram due: 04/18/2009   Smoking status: quit  (06/11/2009)  Diabetes Mellitus   HgbA1C: 8.0  (06/11/2009)   Hemoglobin A1C due: 07/03/2008    Eye exam: No diabetic retinopathy.   Visual acuity OD (best corrected):     20/20 Visual acuity OS (best corrected):     20/20 Intraocular pressure OD:     12 Intraocular pressure OS:     12   (06/06/2008)   Diabetic eye exam  action/deferral: Ophthalmology referral  (06/11/2009)   Eye exam due: 06/2009    Foot exam: yes  (03/06/2009)   High risk foot: Not documented   Foot care education: Not documented    Urine microalbumin/creatinine ratio: 65.1  (03/06/2009)    Diabetes flowsheet reviewed?: Yes   Progress toward A1C goal: Unchanged  Lipids   Total Cholesterol: 120  (07/20/2008)   LDL:  51  (07/20/2008)   LDL Direct: Not documented   HDL: 60  (07/20/2008)   Triglycerides: 47  (07/20/2008)    SGOT (AST): 23  (07/20/2008)   SGPT (ALT): 24  (07/20/2008)   Alkaline phosphatase: 65  (07/20/2008)   Total bilirubin: 0.4  (07/20/2008)    Lipid flowsheet reviewed?: Yes   Progress toward LDL goal: Unchanged  Hypertension   Last Blood Pressure: 183 / 102  (06/11/2009)   Serum creatinine: 0.87  (07/20/2008)   Serum potassium 4.5  (07/20/2008)    Hypertension flowsheet reviewed?: Yes   Progress toward BP goal: Deteriorated  Self-Management Support :    Patient will work on the following items until the next clinic visit to reach self-care goals:     Medications and monitoring: take my medicines every day, check my blood pressure, bring all of my medications to every visit, examine my feet every day  (06/11/2009)     Eating: drink diet soda or water instead of juice or soda, eat more vegetables, eat foods that are low in salt, eat baked foods instead of fried foods, eat fruit for snacks and desserts, limit or avoid alcohol  (06/11/2009)     Activity: take a 30 minute walk every day, take the stairs instead of the elevator, park at the far end of the parking lot  (06/11/2009)    Diabetes self-management support: Copy of home glucose meter record, CBG self-monitoring log, Written self-care plan  (06/11/2009)   Diabetes care plan printed   Last diabetes self-management training by diabetes educator: 05/02/2008   Last medical nutrition therapy: 12/22/2007    Hypertension self-management support: BP self-monitoring log, Written self-care plan, Education handout  (06/11/2009)   Hypertension self-care plan printed.   Hypertension education handout printed    Lipid self-management support: Lipid monitoring log, Written self-care plan  (06/11/2009)   Lipid self-care plan printed.   Nursing Instructions: Refer for screening diabetic eye exam (see order)      Laboratory  Results   Blood Tests   Date/Time Received: June 11, 2009 10:23 AM Date/Time Reported: Alric Quan  June 11, 2009 10:23 AM   HGBA1C: 8.0%   (Normal Range: Non-Diabetic - 3-6%   Control Diabetic - 6-8%) CBG Random:: 273mg /dL

## 2010-04-18 NOTE — Assessment & Plan Note (Signed)
Summary: DM TRAINING/VS  Is Patient Diabetic? Yes Did you bring your meter with you today? No Comments patient reports her CBg was 68 this am, did not test today in office MEDICAL NUTRITION THERAPY  Assessment:Patient reports requent episodes of hypoglycemia treated with juice often. Desires better cotrolled blood sugars. Discussed options to achiee this including carb counting focusing on consistency, addiing Novolog correction to current doses and possible being able to decrease current doses, basal bolus insulin therapy. Patient chose to focus on carb consistency. She currently eats three meals nad three snacks fairly high on fat.  Wt:259# Medications:Noolog Mix 70/30 30 units before breakfast and 20 units before dinner, metformin Blood sugars:reports CBGs are 59-76 fasting in am and 150s when she comes home from work 24 hour recall: 8 am- 8 oz juice nad 1 pack plain oatmeal, snack-10 am-11 am wheat and cheese crackers, lunch-1-2 pm -salad and captains wafers or grilled chicken samdwich for lunch, 5 pm snack- fruit and or juice if low or/and peanutbutter crackers, dinner 8 pm- starch, vegetables, broiled meat, pm snack- fruit, peanutbutter crackers Exercise:work Labs:a1c- 8.1%  Diagnosis:  NI 5.8.4 inconsisten carb intake as related to fluctuating blood sugars as evidenced by her report. NB 1.1 Food and nutrtionrelated knowledge deficit as related to not understadnign importance of insulin and carbohydrate relationship and matching to cntrol blood sugars as eidenced by her taking in carbs at difrertn times form when she checks CBGs and take insulin.  Intervention:  1- education about carb counting- focusing on consistency 2- meal plan provided=    2-3 serings carb for breakfast,          1-2 serings carb for am snack,        2 serings carbs for lunch,        1 sering carb for pm snack,          2-3 serings carbs for dinner       1-2 serings carb for hs snack 3-Education on how to  record carbs and amounts of ffods and label reading for carb 4- patient to call if she has hypoglycemia  Monitoring: CBGs and food records and patient abiltiy to use information to self management her diabetes Evaluation: A1C  Follow-up: 2- 4 weeks    Allergies: No Known Drug Allergies   Complete Medication List: 1)  Novolin 70/30 70-30 % Susp (Insulin isophane & regular) .... Please take 30 units in the morning with breakfast, and 20 units at night with dinner. it needs to be taken with food 2)  Glucophage 1000 Mg Tabs (Metformin hcl) .... Take 1 tablet by mouth two times a day 3)  Lipitor 20 Mg Tabs (Atorvastatin calcium) .... Take 1 tablet by mouth once a day 4)  Lisinopril-hydrochlorothiazide 20-12.5 Mg Tabs (Lisinopril-hydrochlorothiazide) .... Take 2 tabs by mouth once daily 5)  Hydralazine Hcl 50 Mg Tabs (Hydralazine hcl) .... Take one tablet two times a day for blood pressure. 6)  Aspirin 81 Mg Tbec (Aspirin) .... Take 1 tablet by mouth once a day 7)  Labetalol Hcl 200 Mg Tabs (Labetalol hcl) .... Take 2 tabs by mouth two times a day 8)  Freestyle Lite Strp (Glucose blood) .... To test blood sugar twice daily 9)  Freestyle Lancets Misc (Lancets) .... To test blood sugar twice daily 10)  Catapres 0.3 Mg Tabs (Clonidine hcl) .... Take 1 tablet by mouth two times a day 11)  Centrum Tabs (Multiple vitamins-minerals)  Other Orders: MNT/Reassessment and Intervention, Individual, 15 Minutes (  [id number]) 

## 2010-04-18 NOTE — Progress Notes (Signed)
Summary: med change/gp  Phone Note Refill Request Message from:  Fax from Pharmacy on April 05, 2009 10:02 AM  Refills Requested: Medication #1:  LISINOPRIL-HYDROCHLOROTHIAZIDE 20-12.5 MG TABS Take 2 tabs by mouth once daily [facility] pharmacy can provide Accuretic 20/12.5mg  daily at no charge if you would consider changing ACE-I combo.   Method Requested: Fax to Local Pharmacy Initial call taken by: Chinita Pester RN,  April 05, 2009 10:02 AM  Follow-up for Phone Call        lets stick with lisinopril/hctz because they will run out of accuretic next month and then ask me to change it back.  it is working fine as is.  thank you.     Appended Document: med change/gp [facility] pharmacy made awared.

## 2010-04-18 NOTE — Progress Notes (Signed)
Summary: Refill/gh  Phone Note Refill Request   Refills Requested: Medication #1:  QUINAPRIL-HYDROCHLOROTHIAZIDE 20-12.5 MG TABS Take 2 tablet by mouth once a day   Dosage confirmed as above?Dosage Confirmed   Brand Name Necessary? No   Supply Requested: 1 year Pharmacy can provide Accuretic 20/12.5 mg 2 daily at no charge if you would consider a change in Ace-I combo.   Method Requested: Fax to Local Pharmacy Initial call taken by: Angelina Ok RN,  November 29, 2009 3:45 PM  Follow-up for Phone Call        Refill approved- I faxed the the above mentioned medication prescription to the [facility]  Thanks Follow-up by: Lars Mage MD,  December 01, 2009 11:23 AM    New/Updated Medications: QUINAPRIL-HYDROCHLOROTHIAZIDE 20-12.5 MG TABS (QUINAPRIL-HYDROCHLOROTHIAZIDE) Take 2 tablet by mouth once a day Prescriptions: QUINAPRIL-HYDROCHLOROTHIAZIDE 20-12.5 MG TABS (QUINAPRIL-HYDROCHLOROTHIAZIDE) Take 2 tablet by mouth once a day  #60 x 11   Entered and Authorized by:   Lars Mage MD   Signed by:   Lars Mage MD on 12/01/2009   Method used:   Faxed to ...       [facility] (retail)       [address]       [place], [place]  [address]       Ph: [phone]       Fax: [phone]   RxID:   [id number]

## 2010-04-18 NOTE — Progress Notes (Signed)
Summary: refill  Phone Note Call from Patient   Reason for Call: Refill Medication Details for Reason: norvase and insulin Summary of Call: Phone call from Ashley Long that she needs to have her medications called in for Insulin and Norvase. I will send this to the refill nurse. Initial call taken by: Filomena Jungling NT II,  March 12, 2010 10:52 AM  Follow-up for Phone Call        rx completed electronically. Follow-up by: Lars Mage MD,  March 13, 2010 11:24 AM    Prescriptions: AMLODIPINE BESYLATE 10 MG TABS (AMLODIPINE BESYLATE) at bedtime  #30 x 11   Entered and Authorized by:   Lars Mage MD   Signed by:   Lars Mage MD on 03/13/2010   Method used:   Electronically to        [facility] (retail)       [address]       [place], [place]  [address]       Ph: [phone]       Fax: [phone]   RxID:   [id number] NOVOLIN 70/30 70-30 % SUSP (INSULIN ISOPHANE & REGULAR) Please take 30 units in the morning with breakfast, and 20 units at night with dinner. It needs to be taken with food  #1 vial x 11   Entered and Authorized by:   Lars Mage MD   Signed by:   Lars Mage MD on 03/13/2010   Method used:   [facility]       [address]       [place], [place]  [address]       Ph: [phone]       Fax: [phone]   RxID:   [id number]

## 2010-04-18 NOTE — Assessment & Plan Note (Signed)
Summary: DM TEACHING/CH   Vital Signs:  Patient profile:   50 year old female Weight:      266.2 pounds BMI:     40.62  Allergies: No Known Drug Allergies   Complete Medication List: 1)  Novolin 70/30 70-30 % Susp (Insulin isophane & regular) .... Please take 30 units in the morning with breakfast, and 20 units at night with dinner. it needs to be taken with food 2)  Glucophage 1000 Mg Tabs (Metformin hcl) .... Take 1 tablet by mouth two times a day 3)  Lipitor 20 Mg Tabs (Atorvastatin calcium) .... Take 1 tablet by mouth once a day 4)  Quinapril-hydrochlorothiazide 20-12.5 Mg Tabs (Quinapril-hydrochlorothiazide) .... Take 2 tablet by mouth once a day 5)  Aspirin 81 Mg Tbec (Aspirin) .... Take 1 tablet by mouth once a day 6)  Labetalol Hcl 200 Mg Tabs (Labetalol hcl) .... Take 2 tabs by mouth two times a day 7)  Freestyle Lite Strp (Glucose blood) .... To test blood sugar twice daily 8)  Freestyle Lancets Misc (Lancets) .... To test blood sugar twice daily 9)  Catapres 0.3 Mg Tabs (Clonidine hcl) .... Take 1 tablet by mouth two times a day 10)  Centrum Tabs (Multiple vitamins-minerals) 11)  Amlodipine Besylate 10 Mg Tabs (Amlodipine besylate) .... At bedtime  Other Orders: MNT/Reassessment and Intervention, Individual, 15 Minutes [phone])  Patient Instructions: 1)  try to keep carbs consistent at breakfast and lunch 2)  Breakfasts meals goal is 45-50 grams carb 3)  plain oatmeal=19 grams carb & banana=25 = 44 grams Total 4)  maple & brown sugar oatmeal= 33 & 1 cup milk=12= 45 grams total 5)  1 c Frosted flakes = 36 grams carb & 1 cup milk = 12 = 47 grams total 6)  1 cup cheerios= 20 grams carb & 1 cup milk= 12 & 1/2 banana= 12 = total 44 grams total  7)  Banana= 25 & Nabs=23 grams = total 48 grams 8)  Change to Humalog Mix 75/25 will problably help lower after meal sugars. 9)  TRy to get 2-3 days back to back where you check 4 times a day- before breakfast 10)  before lunch 11)   before supper 12)  before bed 13)  Please take your medicines as prescribed. 14)  Please schedule a follow-up appointmentwith PCP(Dr.Garg) in 1 month. 15)  Please schedule a follow up appointment with Jamison Neighbor in the next week. 16)  Please call the clinic if you notiice frank blood or your symptoms get worse.  MEDICAL NUTRITION THERAPY Start time:9:15 AM End time:10:00 AM Assessment: Patient and physicina desire lower A1C, blood sugars. Patient can obtain lilly insulkins through patient assistacne program that she is currently enrolled in.  UE:[id number], patient not concerned Medications:Humulin 70/3- 30 units at 8 AM & 20 units @ 8 PM Blood sugars: one nocturnal low in past month, a few lows midday from goin g long stretches of time wiht little to no carb intake 24 hour recall: cereal & milk- sometimes wiht banana, oatmeal or banana and nabs, lunch varies from light snack, to nothing to small meal Exercise:patient reports being active but no formal activity plan or routine Labs: A1C 8.5%  Diagnosis:  NI-5.8.4 -Inconsistent carb intake as related to high A1C as evidenced by patient report and variable blood sugars.  Intervention:  1- See patient instructions 2- Education on carb consistency, review of label reading 3- Coordination of care with physician regarding insulin types/regimen  Monitoring: verbalizes knowledge  of carb consistent meals, insulin types Evaluation: A1C, post prandial blood sugars, more consistent blood sugars  Follow-up: 1 month with blood sugar profile if not going to change to new insulin, if changing to analog insulin- then patient to follow up after she collects 2-3 days blood sugar profile on new insulin regimen  Orders Added: 1)  MNT/Reassessment and Intervention, Individual, 15 Minutes [[id number]]

## 2010-04-18 NOTE — Progress Notes (Signed)
Summary: refill/gg  Phone Note Refill Request  on April 05, 2009 4:11 PM  Refills Requested: Medication #1:  NOVOLIN 70/30 70-30 % SUSP Please take 30 units in the morning with breakfast   Last Refilled: 03/19/2009  Method Requested: Fax to Local Pharmacy Initial call taken by: Merrie Roof RN,  April 05, 2009 4:12 PM  Follow-up for Phone Call        signed. please call in. thank you.    Prescriptions: NOVOLIN 70/30 70-30 % SUSP (INSULIN ISOPHANE & REGULAR) Please take 30 units in the morning with breakfast, and 20 units at night with dinner. It needs to be taken with food  #4 vials x 6   Entered and Authorized by:   Elby Showers MD   Signed by:   Elby Showers MD on 04/06/2009   Method used:   Telephoned to ...       [facility] (retail)       [address]       [place], [place]  [address]       Ph: [phone]       Fax: [phone]   RxID:   [id number]

## 2010-04-18 NOTE — Progress Notes (Signed)
Summary: new med Rx's/gp  Phone Note Refill Request Message from:  Patient on October 08, 2009 12:29 PM  Refills Requested: Medication #1:  NOVOLIN 70/30 70-30 % SUSP Please take 30 units in the morning with breakfast   Dosage confirmed as above?Dosage Confirmed   Brand Name Necessary? No   Supply Requested: 1 year  Medication #2:  LIPITOR 20 MG  TABS Take 1 tablet by mouth once a day   Dosage confirmed as above?Dosage Confirmed   Brand Name Necessary? No   Supply Requested: 1 year  Medication #3:  GLUCOPHAGE 1000 MG TABS Take 1 tablet by mouth two times a day   Dosage confirmed as above?Dosage Confirmed   Brand Name Necessary? No   Supply Requested: 1 year  Medication #4:  LISINOPRIL-HYDROCHLOROTHIAZIDE 20-12.5 MG TABS Take 2 tabs by mouth once daily   Dosage confirmed as above?Dosage Confirmed   Brand Name Necessary? No   Supply Requested: 1 year   MEDICATION #5  Catapres 0.3mg  - Take 1 tablet by mouth 2 times a day  Pt. needs new Rxs. fax to [facility] pharmacy  [phone].   Method Requested: Fax to Local Pharmacy Initial call taken by: Chinita Pester RN,  October 08, 2009 12:29 PM  Follow-up for Phone Call        Refill approved-nurse to complete Follow-up by: Lars Mage MD,  October 09, 2009 3:47 PM    Prescriptions: CATAPRES 0.3 MG TABS (CLONIDINE HCL) Take 1 tablet by mouth two times a day  #62 x 11   Entered and Authorized by:   Lars Mage MD   Signed by:   Lars Mage MD on 10/09/2009   Method used:   Electronically to        [facility] (retail)       [address]       [place], [place]  [address]       Ph: [phone]       Fax: [phone]   RxID:   [id number] LISINOPRIL-HYDROCHLOROTHIAZIDE 20-12.5 MG TABS (LISINOPRIL-HYDROCHLOROTHIAZIDE) Take 2 tabs by mouth once daily  #180 x 3   Entered and Authorized by:   Lars Mage MD   Signed by:   Lars Mage MD on 10/09/2009   Method used:   Electronically to        [facility] (retail)       [address]       [place], [place]  [address]       Ph: [phone]       Fax: [phone]   RxID:   [id number] LIPITOR 20 MG  TABS (ATORVASTATIN CALCIUM) Take 1 tablet by mouth once a day  #90 x 4   Entered and Authorized by:   Lars Mage MD   Signed by:   Lars Mage MD on 10/09/2009   Method used:   Electronically to        [facility] (retail)       [address]       [place], [place]  [address]       Ph: [phone]       Fax: [phone]   RxID:   [id number] GLUCOPHAGE 1000 MG TABS (METFORMIN HCL) Take 1 tablet by mouth two times a day  #180 x 3   Entered and  Authorized by:   Lars Mage MD   Signed by:   Lars Mage MD on 10/09/2009   Method used:   Electronically to        [facility] (retail)       [address]       [place], [place]  [address]       Ph: [phone]       Fax: [phone]   RxID:   [id number] NOVOLIN 70/30 70-30 % SUSP (INSULIN ISOPHANE & REGULAR) Please take 30 units in the morning with breakfast, and 20 units at night with dinner. It needs to be taken with food  #1 vial x 11   Entered and Authorized by:   Lars Mage MD   Signed by:   Lars Mage MD on 10/09/2009   Method used:   Electronically to        [facility] (retail)       [address]       [place], [place]  [address]       Ph: [phone]       Fax: [phone]   RxID:   [id number] CATAPRES 0.3 MG TABS (CLONIDINE HCL) Take 1 tablet by mouth two times a day  #62 x 11   Entered and Authorized by:   Lars Mage MD   Signed by:   Lars Mage MD on 10/09/2009   Method used:   Telephoned to ...       [facility] (retail)             , [place]         Ph: [phone]       Fax: [phone]   RxID:   [id number] LISINOPRIL-HYDROCHLOROTHIAZIDE 20-12.5 MG TABS (LISINOPRIL-HYDROCHLOROTHIAZIDE) Take 2 tabs by mouth once daily  #180 x  3   Entered and Authorized by:   Lars Mage MD   Signed by:   Lars Mage MD on 10/09/2009   Method used:   Telephoned to ...       [facility] (retail)             , [place]         Ph: [phone]       Fax: [phone]   RxID:   [id number] LIPITOR 20 MG  TABS (ATORVASTATIN CALCIUM) Take 1 tablet by mouth once a day  #90 x 4   Entered and Authorized by:   Lars Mage MD   Signed by:   Lars Mage MD on 10/09/2009   Method used:   Telephoned to ...       [facility] (retail)             , [place]         Ph: [phone]       Fax: [phone]   RxID:   [id number] GLUCOPHAGE 1000 MG TABS (METFORMIN HCL) Take 1 tablet by mouth two times a day  #180 x 3   Entered and Authorized by:   Lars Mage MD   Signed by:   Lars Mage MD on 10/09/2009   Method used:   Telephoned to ...       [facility] (retail)             , [place]         Ph: [phone]       Fax: [phone]   RxID:  [id number] NOVOLIN 70/30 70-30 % SUSP (INSULIN ISOPHANE & REGULAR) Please take 30 units in the morning with breakfast, and 20 units at night with dinner. It needs to be taken with food  #1 vial x 11   Entered and Authorized by:   Lars Mage MD   Signed by:   Lars Mage MD on 10/09/2009   Method used:   Telephoned to ...       [facility] (retail)             , [place]         Ph: [phone]       Fax: [phone]   RxID:   [id number]   Appended Document: new med Rx's/gp meds faxed to [facility]

## 2010-04-18 NOTE — Assessment & Plan Note (Signed)
Summary: FU VISIT/DS   Vital Signs:  Patient profile:   50 year old female Height:      68 inches (172.72 cm) Weight:      262.9 pounds (119.50 kg) BMI:     40.12 Temp:     97.9 degrees F (36.61 degrees C) oral Pulse rate:   87 / minute BP sitting:   144 / 90  (right arm) Cuff size:   large  Vitals Entered By: Cynda Familia Duncan Dull) (December 03, 2009 2:53 PM) CC: routine f/u Is Patient Diabetic? Yes Did you bring your meter with you today? Yes Pain Assessment Patient in pain? no      Nutritional Status BMI of > 30 = obese CBG Result 150  Have you ever been in a relationship where you felt threatened, hurt or afraid?No   Does patient need assistance? Functional Status Self care Ambulation Normal   Primary Care Provider:  Lars Mage MD  CC:  routine f/u.  History of Present Illness: Ashley Long is a 50 year old Female with PMH/problems as outlined in the EMR, who presents to the [facility] for follow up on her blood pressure. She has had high blood pressure in the clinic always, she has a home monitor which she has brought to the clinic today. I repeated her BP manually 144/90, and her electronic monitor also gave the reading of 163/98 10 minutes ago.   She will need a flu shot, pneumovax shot and tetanus shot today. She also needs a pap smear today.  She needs refills and denies any new complaints today.   Preventive Screening-Counseling & Management  Alcohol-Tobacco     Alcohol drinks/day: 0     Smoking Status: quit     Year Quit: 1984     Passive Smoke Exposure: no  Problems Prior to Update: 1)  Irregular Menses  (ICD-626.4) 2)  Obesity  (ICD-278.00) 3)  Hypertension  (ICD-401.9) 4)  Hyperlipidemia  (ICD-272.4) 5)  Diabetes Mellitus, Type II  (ICD-250.00) 6)  Cellulitis and Abscess of Unspecified Digit  (ICD-681.9) 7)  Boils, Recurrent  (ICD-680.9) 8)  Preventive Health Care  (ICD-V70.0)  Medications Prior to Update: 1)  Novolin 70/30 70-30 % Susp (Insulin  Isophane & Regular) .... Please Take 30 Units in The Morning With Breakfast, and 20 Units At Night With Dinner. It Needs To Be Taken With Food 2)  Glucophage 1000 Mg Tabs (Metformin Hcl) .... Take 1 Tablet By Mouth Two Times A Day 3)  Lipitor 20 Mg  Tabs (Atorvastatin Calcium) .... Take 1 Tablet By Mouth Once A Day 4)  Quinapril-Hydrochlorothiazide 20-12.5 Mg Tabs (Quinapril-Hydrochlorothiazide) .... Take 2 Tablet By Mouth Once A Day 5)  Hydralazine Hcl 50 Mg Tabs (Hydralazine Hcl) .... Take One Tablet Two Times A Day For Blood Pressure. 6)  Aspirin 81 Mg Tbec (Aspirin) .... Take 1 Tablet By Mouth Once A Day 7)  Labetalol Hcl 200 Mg  Tabs (Labetalol Hcl) .... Take 2 Tabs By Mouth Two Times A Day 8)  Freestyle Lite   Strp (Glucose Blood) .... To Test Blood Sugar Twice Daily 9)  Freestyle Lancets   Misc (Lancets) .... To Test Blood Sugar Twice Daily 10)  Catapres 0.3 Mg Tabs (Clonidine Hcl) .... Take 1 Tablet By Mouth Two Times A Day 11)  Centrum  Tabs (Multiple Vitamins-Minerals)  Current Medications (verified): 1)  Novolin 70/30 70-30 % Susp (Insulin Isophane & Regular) .... Please Take 30 Units in The Morning With Breakfast, and 20 Units At  Night With [employer]. It Needs To Be Taken With Food 2)  Glucophage 1000 Mg Tabs (Metformin Hcl) .... Take 1 Tablet By Mouth Two Times A Day 3)  Lipitor 20 Mg  Tabs (Atorvastatin Calcium) .... Take 1 Tablet By Mouth Once A Day 4)  Quinapril-Hydrochlorothiazide 20-12.5 Mg Tabs (Quinapril-Hydrochlorothiazide) .... Take 2 Tablet By Mouth Once A Day 5)  Aspirin 81 Mg Tbec (Aspirin) .... Take 1 Tablet By Mouth Once A Day 6)  Labetalol Hcl 200 Mg  Tabs (Labetalol Hcl) .... Take 2 Tabs By Mouth Two Times A Day 7)  Freestyle Lite   Strp (Glucose Blood) .... To Test Blood Sugar Twice Daily 8)  Freestyle Lancets   Misc (Lancets) .... To Test Blood Sugar Twice Daily 9)  Catapres 0.3 Mg Tabs (Clonidine Hcl) .... Take 1 Tablet By Mouth Two Times A Day 10)  Centrum  Tabs  (Multiple Vitamins-Minerals) 11)  Amlodipine Besylate 10 Mg Tabs (Amlodipine Besylate) .... At Bedtime  Allergies (verified): No Known Drug Allergies  Past History:  Past Medical History: Last updated: 01/05/2008 "She has had her tubes tied" Her blood pressure is generally much higher when she initially sees me, and then improves over the course of the clinic, though since 09/09, it has remained elevated.  Family History: Last updated: 11/05/2007 HTN and DM runs in the family. No history of colon cancer  Social History: Last updated: 03/06/2009 Single Lives at home with daughter She believes with fair certainty that she is in a monogomous relationship with her boyfriend.   works [employer]  Risk Factors: Alcohol Use: 0 (12/03/2009) Exercise: yes (08/28/2009)  Risk Factors: Smoking Status: quit (12/03/2009) Passive Smoke Exposure: no (12/03/2009)  Family History: Reviewed history from 11/05/2007 and no changes required. HTN and DM runs in the family. No history of colon cancer  Social History: Reviewed history from 03/06/2009 and no changes required. Single Lives at home with daughter She believes with fair certainty that she is in a monogomous relationship with her boyfriend.   works [employer]  Review of Systems      See HPI  Physical Exam  Additional Exam:  Gen: AOx3, in no acute distress Eyes: PERRL, EOMI ENT:MMM, No erythema noted in posterior pharynx Neck: No JVD, No LAP Chest: CTAB with  good respiratory effort CVS: regular rhythmic rate, NO M/R/G, S1 S2 normal Abdo: soft,ND, BS+x4, Non tender and No hepatosplenomegaly EXT: No odema noted Neuro: Non focal, gait is normal Skin: no rashes noted.  Pelvic exam: general pelvic exam was c/w normal appearing mucosa without nay discharge or odour, Pap smear was taken and sent to the cytology lab.  Diabetes Management Exam:    Foot Exam (with socks and/or shoes not  present):       Sensory-Monofilament:          Left foot: abnormal   Impression & Recommendations:  Problem # 1:  HYPERTENSION (ICD-401.9) Assessment Unchanged Patient is on Hydralazine two times a day which i will discontinue and start her on norvasc. She is already on 5 BP meds as mentioned below. Dr Wells Guiles note describes that all secondary causes of HTN were ruled out. Patient is complaint with her meds. May go up on labetalol upto max 2400mg  per day. Will follow her back and make appropriate changes.  The following medications were removed from the medication list:    Hydralazine Hcl 50 Mg Tabs (Hydralazine hcl) .Marland Kitchen... Take one tablet two times a day for blood  pressure. Her updated medication list for this problem includes:    Quinapril-hydrochlorothiazide 20-12.5 Mg Tabs (Quinapril-hydrochlorothiazide) .Marland Kitchen... Take 2 tablet by mouth once a day    Labetalol Hcl 200 Mg Tabs (Labetalol hcl) .Marland Kitchen... Take 2 tabs by mouth two times a day    Catapres 0.3 Mg Tabs (Clonidine hcl) .Marland Kitchen... Take 1 tablet by mouth two times a day    Amlodipine Besylate 10 Mg Tabs (Amlodipine besylate) .Marland Kitchen... At bedtime  BP today: 144/90 Prior BP: 182/100 (08/28/2009)  Labs Reviewed: K+: 4.5 (08/14/2009) Creat: : 1.08 (08/14/2009)   Chol: 116 (08/14/2009)   HDL: 43 (08/14/2009)   LDL: 52 (08/14/2009)   TG: 107 (08/14/2009)  Problem # 2:  HYPERLIPIDEMIA (ICD-272.4) At goal. Her updated medication list for this problem includes:    Lipitor 20 Mg Tabs (Atorvastatin calcium) .Marland Kitchen... Take 1 tablet by mouth once a day  Labs Reviewed: SGOT: 16 (08/14/2009)   SGPT: 17 (08/14/2009)   HDL:43 (08/14/2009), 60 (07/20/2008)  LDL:52 (08/14/2009), 51 (07/20/2008)  Chol:116 (08/14/2009), 120 (07/20/2008)  Trig:107 (08/14/2009), 47 (07/20/2008)  Problem # 3:  PREVENTIVE HEALTH CARE (ICD-V70.0) Assessment: Comment Only pap smear done today. Flu shot today. Tetanus shot today.  Problem # 4:  DIABETES MELLITUS, TYPE II  (ICD-250.00) Well controlled with Hba1c 7.5 today. Foot exam today. Eye exam recent.  Her updated medication list for this problem includes:    Novolin 70/30 70-30 % Susp (Insulin isophane & regular) .Marland Kitchen... Please take 30 units in the morning with breakfast, and 20 units at night with dinner. it needs to be taken with food    Glucophage 1000 Mg Tabs (Metformin hcl) .Marland Kitchen... Take 1 tablet by mouth two times a day    Quinapril-hydrochlorothiazide 20-12.5 Mg Tabs (Quinapril-hydrochlorothiazide) .Marland Kitchen... Take 2 tablet by mouth once a day    Aspirin 81 Mg Tbec (Aspirin) .Marland Kitchen... Take 1 tablet by mouth once a day  Orders: T-Hgb A1C (in-house) ([address]UE) T- Capillary Blood Glucose (45[address])  Labs Reviewed: Creat: 1.08 (08/14/2009)     Last Eye Exam: No diabetic retinopathy.    (09/18/2009) Reviewed HgBA1c results: 7.6 (12/03/2009)  8.0 (06/11/2009)  Complete Medication List: 1)  Novolin 70/30 70-30 % Susp (Insulin isophane & regular) .... Please take 30 units in the morning with breakfast, and 20 units at night with dinner. it needs to be taken with food 2)  Glucophage 1000 Mg Tabs (Metformin hcl) .... Take 1 tablet by mouth two times a day 3)  Lipitor 20 Mg Tabs (Atorvastatin calcium) .... Take 1 tablet by mouth once a day 4)  Quinapril-hydrochlorothiazide 20-12.5 Mg Tabs (Quinapril-hydrochlorothiazide) .... Take 2 tablet by mouth once a day 5)  Aspirin 81 Mg Tbec (Aspirin) .... Take 1 tablet by mouth once a day 6)  Labetalol Hcl 200 Mg Tabs (Labetalol hcl) .... Take 2 tabs by mouth two times a day 7)  Freestyle Lite Strp (Glucose blood) .... To test blood sugar twice daily 8)  Freestyle Lancets Misc (Lancets) .... To test blood sugar twice daily 9)  Catapres 0.3 Mg Tabs (Clonidine hcl) .... Take 1 tablet by mouth two times a day 10)  Centrum Tabs (Multiple vitamins-minerals) 11)  Amlodipine Besylate 10 Mg Tabs (Amlodipine besylate) .... At bedtime  Other Orders: T-Pap Smear, Thin Prep  (81191) Admin 1st Vaccine (47829) Flu Vaccine 61yrs + ([address]) Tdap => 74yrs IM (08657) Admin of Any Addtl Vaccine (84696)  Patient Instructions: 1)  Please schedule a follow-up appointment in 6 months. 2)  Please schedule a  follow-up appointment as needed. 3)  It is important that you exercise regularly at least 20 minutes 5 times a week. If you develop chest pain, have severe difficulty breathing, or feel very tired , stop exercising immediately and seek medical attention. 4)  You need to lose weight. Consider a lower calorie diet and regular exercise.  5)  Check your Blood Pressure regularly. If it is above: 180/100 you should make an appointment. Prescriptions: AMLODIPINE BESYLATE 10 MG TABS (AMLODIPINE BESYLATE) at bedtime  #30 x 11   Entered and Authorized by:   Lars Mage MD   Signed by:   Lars Mage MD on 12/03/2009   Method used:   Faxed to ...       [facility] (retail)             , [place]         Ph: [phone]       Fax: [phone]   RxID:   [id number] AMLODIPINE BESYLATE 10 MG TABS (AMLODIPINE BESYLATE) at bedtime  #30 x 11   Entered and Authorized by:   Lars Mage MD   Signed by:   Lars Mage MD on 12/03/2009   Method used:   Printed then faxed to ...       [facility] (retail)             , [place]         Ph: [phone]       Fax: [phone]   RxID:   [id number]  Process Orders Check Orders Results:     [facility]: ABN not required for this insurance Tests Sent for requisitioning (December 05, 2009 12:16 PM):     12/03/2009: [facility] -- T-Pap Smear, Thin Prep [[id number]] (signed)     Prevention & Chronic Care Immunizations   Influenza vaccine: Fluvax 3+  (12/03/2009)   Influenza vaccine deferral: Deferred  (12/03/2009)   Influenza vaccine due: 11/15/2009    Tetanus booster: 12/03/2009: Tdap    Pneumococcal vaccine: Historical  (01/23/2000)  Other Screening   Pap smear: NEGATIVE FOR INTRAEPITHELIAL LESIONS OR  MALIGNANCY.  (05/16/2008)   Pap smear action/deferral: Ordered  (12/03/2009)   Pap smear due: 12/07/2011    Mammogram: ASSESSMENT: Negative - BI-RADS 1^MM DIGITAL SCREENING  (04/24/2009)   Mammogram due: 04/18/2009   Smoking status: quit  (12/03/2009)  Diabetes Mellitus   HgbA1C: 7.6  (12/03/2009)   Hemoglobin A1C due: 07/03/2008    Eye exam: No diabetic retinopathy.     (09/18/2009)   Diabetic eye exam action/deferral: Ophthalmology referral  (06/11/2009)   Eye exam due: 09/2010    Foot exam: yes  (12/03/2009)   Foot exam action/deferral: Do today   High risk foot: Yes  (12/03/2009)   Foot care education: Done  (12/03/2009)    Urine microalbumin/creatinine ratio: 65.1  (03/06/2009)    Diabetes flowsheet reviewed?: Yes   Progress toward A1C goal: Improved  Lipids   Total Cholesterol: 116  (08/14/2009)   LDL: 52  (08/14/2009)   LDL Direct: Not documented   HDL: 43  (08/14/2009)   Triglycerides: 107  (08/14/2009)    SGOT (AST): 16  (08/14/2009)   SGPT (ALT): 17  (08/14/2009)   Alkaline phosphatase: 66  (08/14/2009)   Total bilirubin: 0.6  (08/14/2009)    Lipid flowsheet reviewed?: Yes   Progress toward LDL goal: At goal  Hypertension   Last Blood Pressure: 144 / 90  (12/03/2009)   Serum creatinine: 1.08  (08/14/2009)  Serum potassium 4.5  (08/14/2009)    Hypertension flowsheet reviewed?: No   Progress toward BP goal: Improved  Self-Management Support :   Personal Goals (by the next clinic visit) :     Personal A1C goal: 7  (07/03/2009)     Personal blood pressure goal: 130/80  (07/03/2009)     Personal LDL goal: 100  (07/03/2009)    Patient will work on the following items until the next clinic visit to reach self-care goals:     Medications and monitoring: take my medicines every day  (12/03/2009)     Eating: eat foods that are low in salt, eat baked foods instead of fried foods  (12/03/2009)     Activity: join a walking program  (12/03/2009)    Diabetes  self-management support: Written self-care plan  (12/03/2009)   Diabetes care plan printed   Last diabetes self-management training by diabetes educator: 05/02/2008   Last medical nutrition therapy: 12/22/2007    Hypertension self-management support: Written self-care plan  (12/03/2009)   Hypertension self-care plan printed.    Lipid self-management support: Written self-care plan  (12/03/2009)   Lipid self-care plan printed.   Nursing Instructions: Give tetanus booster today Diabetic foot exam today Give Pneumovax today Pap smear today    Diabetic Foot Exam Foot Inspection Is there a history of a foot ulcer?              No Is there a foot ulcer now?              No Can the patient see the bottom of their feet?          Yes Are the shoes appropriate in style and fit?          Yes Is there swelling or an abnormal foot shape?          No Are the toenails thick?                Yes Are the toenails ingrown?              No Is there heavy callous build-up?              No  Diabetic Foot Care Education Patient educated on appropriate care of diabetic feet.   High Risk Feet? Yes   10-g (5.07) Semmes-Weinstein Monofilament Test Performed by: Lynn Ito          Right Foot          Left Foot Site 1         normal         normal Site 4         normal         abnormal Site 5         normal         abnormal Site 6         abnormal           Impression                abnormal  Laboratory Results   Blood Tests   Date/Time Received: December 03, 2009 3:08 PM Date/Time Reported: Alric Quan  December 03, 2009 3:08 PM   HGBA1C: 7.6%   (Normal Range: Non-Diabetic - 3-6%   Control Diabetic - 6-8%) CBG Random:: 150mg /dL    Flu Vaccine Consent Questions     Do you have a history of severe allergic reactions to this vaccine? no  Any prior history of allergic reactions to egg and/or gelatin? no    Do you have a sensitivity to the preservative Thimersol? no    Do you  have a past history of Guillan-Barre Syndrome? no    Do you currently have an acute febrile illness? no    Have you ever had a severe reaction to latex? no    Vaccine information given and explained to patient? yes    Are you currently pregnant? no    Lot Number:[id number]   Exp Date:09/14/2010   Manufacturer: [employer]    Site Given  rightt Deltoid IM.Marland KitchenCynda Familia [facility])  December 03, 2009 4:53 PM  .opcflu   Immunization History:  Pneumovax Immunization History:    Pneumovax:  historical (01/23/2000)  Immunizations Administered:  Tetanus Vaccine:    Vaccine Type: Tdap    Site: left deltoid    Mfr: GlaxoSmithKline    Dose: 0.5 ml    Route: IM    Given by: Cynda Familia ([facility])    Exp. Date: 12/06/2011    Lot #: [id number]    VIS given: 02/02/08 version given December 03, 2009.

## 2010-04-18 NOTE — Progress Notes (Signed)
Summary: Refill/gh  Phone Note Refill Request Message from:  Patient on March 14, 2010 11:16 AM  Refills Requested: Medication #1:  HUmulin 70/30 insulin 30 units in the am and 20 units at night [phone].  Phone for the pharmacy.  Pt said that the pharmacy only carries the Humulin.  Needs new presription sent to this pharmacy.   Method Requested: Fax to Local Pharmacy Initial call taken by: Angelina Ok RN,  March 14, 2010 11:16 AM  Follow-up for Phone Call        Refill approved-nurse to complete Follow-up by: Julaine Fusi  DO,  March 14, 2010 1:50 PM  Additional Follow-up for Phone Call Additional follow up Details #1::        Rx faxed to pharmacy Additional Follow-up by: Angelina Ok RN,  March 14, 2010 2:46 PM    New/Updated Medications: HUMULIN 70/30 70-30 % SUSP (INSULIN ISOPHANE & REGULAR) Inject 30 units in AM and 20 units in PM with food Prescriptions: HUMULIN 70/30 70-30 % SUSP (INSULIN ISOPHANE & REGULAR) Inject 30 units in AM and 20 units in PM with food  #1 month x 11   Entered and Authorized by:   Julaine Fusi  DO   Signed by:   Julaine Fusi  DO on 03/14/2010   Method used:   Telephoned to ...       [facility] (retail)             , [place]         Ph: [phone]       Fax: [phone]   RxID:   [id number]

## 2010-04-18 NOTE — Progress Notes (Signed)
Summary: Refill/gh  Phone Note Refill Request Message from:  Fax from Pharmacy on November 12, 2009 10:42 AM  Refills Requested: Medication #1:  LISINOPRIL-HYDROCHLOROTHIAZIDE 20-12.5 MG TABS Take 2 tabs by mouth once daily   Dosage confirmed as above?Dosage Confirmed   Brand Name Necessary? No   Supply Requested: 1 year   Last Refilled: 10/15/2009 Pharmacy can provide Accupril 40 mg daily at no charge.  Would you consider a change in meds.   Method Requested: Fax to Local Pharmacy Initial call taken by: Angelina Ok RN,  November 12, 2009 10:43 AM  Follow-up for Phone Call        Refill approved-nurse to complete Follow-up by: Lars Mage MD,  November 13, 2009 11:17 AM    Prescriptions: LISINOPRIL-HYDROCHLOROTHIAZIDE 20-12.5 MG TABS (LISINOPRIL-HYDROCHLOROTHIAZIDE) Take 2 tabs by mouth once daily  #180 x 3   Entered and Authorized by:   Lars Mage MD   Signed by:   Lars Mage MD on 11/13/2009   Method used:   Faxed to ...       [facility] (retail)       [address] [place], [place]  [address]       Ph: [phone]       Fax: [phone]   RxID:   [id number]

## 2010-04-18 NOTE — Progress Notes (Signed)
Summary: diabetes support/dmr  Phone Note Outgoing Call   Call placed by: Jamison Neighbor RD,CDE,  November 14, 2009 8:55 AM Summary of Call: aked by nurse to return patient call with question about transitioning to new medicine. Unable to reach her at either number this morning. Will try again later.  Follow-up for Phone Call        called and spoke to patient at her work: she got what she thinks is Humulin 70/30 from [facility] adn wants to know when an dhow to start it in place of previous insulin tytpe.  still has and is currenlty using 2 vials of what she got from [facility] (she thinks it is Novolog Mix 70/30) takes her current insulin doses at 8 am and 8 pm about 30 minutes before eating.  since she was not sure at all what type insulin she had at home. She said she'd check and leave a message for me tomorrow so we can be sure her medication record is accurate.  she was advised to switch after using the rest of what she had gotten form [facility] unit for unit and that depending on what she had- she may need to take it a bit further or closer to her mealtime depending on what she had and what she will be transitioning to: Humulin and Niovolon 70/30- inject 30 minutes before meals Humalog Mix 75/25 and Novlog Mix 70/30 inject 10-15 minutes before meals Follow-up by: Jamison Neighbor RD,CDE,  November 14, 2009 12:25 PM

## 2010-04-18 NOTE — Assessment & Plan Note (Signed)
Summary: EST-2 MONTH RECHECK/CH   Vital Signs:  Patient profile:   50 year old female Height:      68 inches (172.72 cm) Weight:      255.9 pounds (116.32 kg) BMI:     39.05 Temp:     98.3 degrees F (36.83 degrees C) oral Pulse rate:   78 / minute BP sitting:   182 / 100  (right arm)  Vitals Entered By: Chinita Pester RN (August 28, 2009 1:52 PM)  Serial Vital Signs/Assessments:  Time      Position  BP       Pulse  Resp  Temp     By                     150/90                         Kaled Allende MD  CC: F/U visit. Med. refills. Is Patient Diabetic? Yes Did you bring your meter with you today? Yes Pain Assessment Patient in pain? no      Nutritional Status BMI of > 30 = obese CBG Result 104  Have you ever been in a relationship where you felt threatened, hurt or afraid?No   Does patient need assistance? Functional Status Self care Ambulation Normal   Primary Care Provider:  Elby Showers MD  CC:  F/U visit. Med. refills..  History of Present Illness: Ashley Long is a 50 year old Female with PMH/problems as outlined in the EMR, who presents to the [facility] for follow up on her blood pressure. She has had high blood pressure in the clinic always, she has a home monitor which she has brought to the clinic today. I repeated her BP manually 150/90, and her electronic monitor also gave the same reading. But on repeat exam the electronic monitor was inconsistent.   She needs refills and denies any new complaints today.   Depression History:      The patient denies a depressed mood most of the day and a diminished interest in her usual daily activities.         Preventive Screening-Counseling & Management  Alcohol-Tobacco     Alcohol drinks/day: 0     Smoking Status: quit     Year Quit: 1984     Passive Smoke Exposure: no  Caffeine-Diet-Exercise     Does Patient Exercise: yes     Type of exercise:    WALKING / ROM     Times/week:    2  Current Medications  (verified): 1)  Novolin 70/30 70-30 % Susp (Insulin Isophane & Regular) .... Please Take 30 Units in The Morning With Breakfast, and 20 Units At Night With Dinner. It Needs To Be Taken With Food 2)  Glucophage 1000 Mg Tabs (Metformin Hcl) .... Take 1 Tablet By Mouth Two Times A Day 3)  Lipitor 20 Mg  Tabs (Atorvastatin Calcium) .... Take 1 Tablet By Mouth Once A Day 4)  Lisinopril-Hydrochlorothiazide 20-12.5 Mg Tabs (Lisinopril-Hydrochlorothiazide) .... Take 2 Tabs By Mouth Once Daily 5)  Hydralazine Hcl 50 Mg Tabs (Hydralazine Hcl) .... Take One Tablet Two Times A Day For Blood Pressure. 6)  Aspirin 81 Mg Tbec (Aspirin) .... Take 1 Tablet By Mouth Once A Day 7)  Labetalol Hcl 200 Mg  Tabs (Labetalol Hcl) .... Take 2 Tabs By Mouth Two Times A Day 8)  Freestyle Lite   Strp (Glucose Blood) .... To Test Blood Sugar  Twice Daily 9)  Freestyle Lancets   Misc (Lancets) .... To Test Blood Sugar Twice Daily 10)  Catapres 0.3 Mg Tabs (Clonidine Hcl) .... Take 1 Tablet By Mouth Two Times A Day 11)  Centrum  Tabs (Multiple Vitamins-Minerals)  Allergies (verified): No Known Drug Allergies  Past History:  Past Medical History: Last updated: 01/05/2008 "She has had her tubes tied" Her blood pressure is generally much higher when she initially sees me, and then improves over the course of the clinic, though since 09/09, it has remained elevated.  Family History: Last updated: 11/05/2007 HTN and DM runs in the family. No history of colon cancer  Social History: Last updated: 03/06/2009 Single Lives at home with daughter She believes with fair certainty that she is in a monogomous relationship with her boyfriend.   works [employer]  Risk Factors: Alcohol Use: 0 (08/28/2009) Exercise: yes (08/28/2009)  Risk Factors: Smoking Status: quit (08/28/2009) Passive Smoke Exposure: no (08/28/2009)  Review of Systems      See HPI  Physical Exam  General:  alert and well-developed.    Lungs:  normal respiratory effort and normal breath sounds.   Heart:  normal rate and regular rhythm.   Abdomen:  soft, non-tender, and normal bowel sounds.   Pulses:  normal peripheral pulses  Extremities:  no cyanosis, clubbing or edema  Neurologic:  non focal.  Psych:  Oriented X3 and normally interactive.     Impression & Recommendations:  Problem # 1:  HYPERTENSION (ICD-401.9) Blood pressure above goal in the clinic but her home readings appear to be 120s to 130s systole. I am going to ask her to check her blood pressure at home and let us know if readings are above 130 consistently. Continue with the current regimen for now.   Her updated medication list for this problem includes:    Lisinopril-hydrochlorothiazide 20-12.5 Mg Tabs (Lisinopril-hydrochlorothiazide) .Marland Kitchen... Take 2 tabs by mouth once daily    Hydralazine Hcl 50 Mg Tabs (Hydralazine hcl) .Marland Kitchen... Take one tablet two times a day for blood pressure.    Labetalol Hcl 200 Mg Tabs (Labetalol hcl) .Marland Kitchen... Take 2 tabs by mouth two times a day    Catapres 0.3 Mg Tabs (Clonidine hcl) .Marland Kitchen... Take 1 tablet by mouth two times a day  Problem # 2:  DIABETES MELLITUS, TYPE II (ICD-250.00) A1c: 8.0 (06/11/2009 9:56:13 AM)  MICROALB/CR: 65.1 (03/06/2009 5:59:00 PM) LDL: 52 (08/14/2009 8:16:00 PM) EYE: No diabetic retinopathy.   Visual acuity OD (best corrected):     20/20 Visual acuity OS (best corrected):     20/20 Intraocular pressure OD:     12 Intraocular pressure OS:     12  (06/06/2008 1:45:21 PM) FOOT: yes (03/06/2009 10:05:10 AM) FOOT RISK:   WEIGHT: 39.05 (08/28/2009 1:30:40 PM)   CBG reading on the meter are in the 100s. Will need repeat A1c on next visit.   Her updated medication list for this problem includes:    Novolin 70/30 70-30 % Susp (Insulin isophane & regular) .Marland Kitchen... Please take 30 units in the morning with breakfast, and 20 units at night with dinner. it needs to be taken with food    Glucophage 1000 Mg Tabs  (Metformin hcl) .Marland Kitchen... Take 1 tablet by mouth two times a day    Lisinopril-hydrochlorothiazide 20-12.5 Mg Tabs (Lisinopril-hydrochlorothiazide) .Marland Kitchen... Take 2 tabs by mouth once daily    Aspirin 81 Mg Tbec (Aspirin) .Marland Kitchen... Take 1 tablet by mouth once a day  Orders: Capillary  Blood Glucose/CBG ([address])  Problem # 3:  HYPERLIPIDEMIA (ICD-272.4) Chol: 116 (08/14/2009 8:16:00 PM)HDL:  43 (08/14/2009 8:16:00 PM)LDL:  52 (08/14/2009 8:16:00 PM)Tri:   AST:  16 (08/14/2009 8:16:00 PM) ALT:  17 (08/14/2009 8:16:00 PM)T. Bili:  0.6 (08/14/2009 8:16:00 PM) AP:  66 (08/14/2009 8:16:00 PM)  LDL at goal.  Her updated medication list for this problem includes:    Lipitor 20 Mg Tabs (Atorvastatin calcium) .Marland Kitchen... Take 1 tablet by mouth once a day  Complete Medication List: 1)  Novolin 70/30 70-30 % Susp (Insulin isophane & regular) .... Please take 30 units in the morning with breakfast, and 20 units at night with dinner. it needs to be taken with food 2)  Glucophage 1000 Mg Tabs (Metformin hcl) .... Take 1 tablet by mouth two times a day 3)  Lipitor 20 Mg Tabs (Atorvastatin calcium) .... Take 1 tablet by mouth once a day 4)  Lisinopril-hydrochlorothiazide 20-12.5 Mg Tabs (Lisinopril-hydrochlorothiazide) .... Take 2 tabs by mouth once daily 5)  Hydralazine Hcl 50 Mg Tabs (Hydralazine hcl) .... Take one tablet two times a day for blood pressure. 6)  Aspirin 81 Mg Tbec (Aspirin) .... Take 1 tablet by mouth once a day 7)  Labetalol Hcl 200 Mg Tabs (Labetalol hcl) .... Take 2 tabs by mouth two times a day 8)  Freestyle Lite Strp (Glucose blood) .... To test blood sugar twice daily 9)  Freestyle Lancets Misc (Lancets) .... To test blood sugar twice daily 10)  Catapres 0.3 Mg Tabs (Clonidine hcl) .... Take 1 tablet by mouth two times a day 11)  Centrum Tabs (Multiple vitamins-minerals)  Patient Instructions: 1)  Please schedule a follow-up appointment in 1 month.  Prescriptions: LABETALOL HCL 200 MG  TABS  (LABETALOL HCL) take 2 tabs by mouth two times a day  #120 x 11   Entered and Authorized by:   Zara Council MD   Signed by:   Zara Council MD on 08/28/2009   Method used:   Faxed to ...       [facility] (retail)       [address]       [place], [place]  [address]       Ph: [phone]       Fax: [phone]   RxID:   [id number] CATAPRES 0.3 MG TABS (CLONIDINE HCL) Take 1 tablet by mouth two times a day  #62 x 11   Entered and Authorized by:   Zara Council MD   Signed by:   Zara Council MD on 08/28/2009   Method used:   Faxed to ...       [facility] (retail)       [address]       [place], [place]  [address]       Ph: [phone]       Fax: [phone]   RxID:   [id number] NOVOLIN 70/30 70-30 % SUSP (INSULIN ISOPHANE & REGULAR) Please take 30 units in the morning with breakfast, and 20 units at night with dinner. It needs to be taken with food  #1 vial x 11   Entered and Authorized by:   Zara Council MD   Signed by:   Zara Council MD on 08/28/2009   Method used:   Faxed to ...       [facility] (retail)       [address]  [place], [place]  [address]       Ph: [phone]       Fax: [phone]   RxID:   [id number] LISINOPRIL-HYDROCHLOROTHIAZIDE 20-12.5 MG TABS (LISINOPRIL-HYDROCHLOROTHIAZIDE) Take 2 tabs by mouth once daily  #180 x 3   Entered and Authorized by:   Zara Council MD   Signed by:   Zara Council MD on 08/28/2009   Method used:   Electronically to        [facility] (retail)       [address]       [place], [place]  [address]       Ph: [phone]       Fax: [phone]   RxID:   [id number] LABETALOL HCL 200 MG  TABS (LABETALOL HCL) take 2 tabs by mouth two times a day  #120 x 11   Entered and Authorized by:   Zara Council MD   Signed by:   Zara Council MD on 08/28/2009    Method used:   Electronically to        [facility] (retail)       [address]       [place], [place]  [address]       Ph: [phone]       Fax: [phone]   RxID:   [id number] HYDRALAZINE HCL 50 MG TABS (HYDRALAZINE HCL) Take one tablet two times a day for blood pressure.  #180 x 3   Entered and Authorized by:   Zara Council MD   Signed by:   Zara Council MD on 08/28/2009   Method used:   Electronically to        [facility] (retail)       [address]       [place], [place]  [address]       Ph: [phone]       Fax: [phone]   RxID:   [id number] GLUCOPHAGE 1000 MG TABS (METFORMIN HCL) Take 1 tablet by mouth two times a day  #180 x 3   Entered and Authorized by:   Zara Council MD   Signed by:   Zara Council MD on 08/28/2009   Method used:   Electronically to        [facility] (retail)       [address]       [place], [place]  [address]       Ph: [phone]       Fax: [phone]   RxID:   [id number]   Prevention & Chronic Care Immunizations   Influenza vaccine: Fluvax Non-MCR  (12/06/2008)   Influenza vaccine due: 11/15/2009    Tetanus booster: Not documented    Pneumococcal vaccine: Not documented  Other Screening   Pap smear: NEGATIVE FOR INTRAEPITHELIAL LESIONS OR MALIGNANCY.  (05/16/2008)   Pap smear due: 12/07/2011    Mammogram: ASSESSMENT: Negative - BI-RADS 1^MM DIGITAL SCREENING  (04/24/2009)   Mammogram due: 04/18/2009   Smoking status: quit  (08/28/2009)  Diabetes Mellitus   HgbA1C: 8.0  (06/11/2009)   Hemoglobin A1C due: 07/03/2008    Eye exam: No diabetic retinopathy.   Visual acuity OD (best corrected):     20/20 Visual acuity OS (best corrected):     20/20 Intraocular pressure  OD:     12 Intraocular pressure OS:     12   (06/06/2008)   Diabetic eye exam action/deferral: Ophthalmology referral  (06/11/2009)   Eye exam  due: 06/2009    Foot exam: yes  (03/06/2009)   High risk foot: Not documented   Foot care education: Not documented    Urine microalbumin/creatinine ratio: 65.1  (03/06/2009)    Diabetes flowsheet reviewed?: Yes   Progress toward A1C goal: Unchanged  Lipids   Total Cholesterol: 116  (08/14/2009)   LDL: 52  (08/14/2009)   LDL Direct: Not documented   HDL: 43  (08/14/2009)   Triglycerides: 107  (08/14/2009)    SGOT (AST): 16  (08/14/2009)   SGPT (ALT): 17  (08/14/2009)   Alkaline phosphatase: 66  (08/14/2009)   Total bilirubin: 0.6  (08/14/2009)    Lipid flowsheet reviewed?: Yes   Progress toward LDL goal: At goal  Hypertension   Last Blood Pressure: 182 / 100  (08/28/2009)   Serum creatinine: 1.08  (08/14/2009)   Serum potassium 4.5  (08/14/2009)    Hypertension flowsheet reviewed?: Yes   Progress toward BP goal: Deteriorated  Self-Management Support :   Personal Goals (by the next clinic visit) :     Personal A1C goal: 7  (07/03/2009)     Personal blood pressure goal: 130/80  (07/03/2009)     Personal LDL goal: 100  (07/03/2009)    Patient will work on the following items until the next clinic visit to reach self-care goals:     Medications and monitoring: examine my feet every day  (08/28/2009)     Eating: drink diet soda or water instead of juice or soda, eat more vegetables, use fresh or frozen vegetables, eat foods that are low in salt, eat baked foods instead of fried foods, eat fruit for snacks and desserts  (08/28/2009)     Activity: take a 30 minute walk every day  (08/28/2009)    Diabetes self-management support: Copy of home glucose meter record, Written self-care plan  (08/28/2009)   Diabetes care plan printed   Last diabetes self-management training by diabetes educator: 05/02/2008   Last medical nutrition therapy: 12/22/2007    Hypertension self-management support: Written self-care plan  (08/28/2009)   Hypertension self-care plan printed.    Lipid  self-management support: Written self-care plan  (08/28/2009)   Lipid self-care plan printed.

## 2010-04-18 NOTE — Assessment & Plan Note (Signed)
Summary: BLOOD IN STOOL/SB.   Vital Signs:  Patient profile:   50 year old female Height:      68 inches Weight:      264.0 pounds BMI:     40.29 Temp:     97.1 degrees F oral Pulse rate:   94 / minute BP sitting:   110 / 70  (right arm)  Vitals Entered By: Filomena Jungling NT II (February 05, 2010 8:43 AM) CC: BLOOD IN STOOL X 2 Is Patient Diabetic? Yes Did you bring your meter with you today? Yes Nutritional Status BMI of > 30 = obese  Have you ever been in a relationship where you felt threatened, hurt or afraid?No   Does patient need assistance? Functional Status Self care Ambulation Normal   Diabetic Foot Exam    10-g (5.07) Semmes-Weinstein Monofilament Test           Right Foot          Left Foot Visual Inspection     normal         normal   Primary Care Gleen Ripberger:  Lars Mage MD  CC:  BLOOD IN STOOL X 2.  History of Present Illness: 50 y/o woman with PMH significant for HTN, Type 2 DM, comes to the clinic as she noticed blood in her stools on two occassions during this month.  She describes it as fresh blood, that she noticed on her tissue when she was wiping. She denies black stools/ abdominal pain / weight loss/ N/ V/ alteration in her bowel movements. Also denies family h/o polyps/ IBD or colon cancer.  Her blood sugars have been running fine as per the patient. This morning her FBS was 89. She denies any hypoglycemic episodes.  Preventive Screening-Counseling & Management  Alcohol-Tobacco     Alcohol drinks/day: 0     Smoking Status: quit     Year Quit: 1984     Passive Smoke Exposure: no  Caffeine-Diet-Exercise     Does Patient Exercise: yes     Type of exercise:    WALKING / ROM     Times/week:    2  Current Medications (verified): 1)  Novolin 70/30 70-30 % Susp (Insulin Isophane & Regular) .... Please Take 30 Units in The Morning With Breakfast, and 20 Units At Night With Dinner. It Needs To Be Taken With Food 2)  Glucophage 1000 Mg Tabs  (Metformin Hcl) .... Take 1 Tablet By Mouth Two Times A Day 3)  Lipitor 20 Mg  Tabs (Atorvastatin Calcium) .... Take 1 Tablet By Mouth Once A Day 4)  Quinapril-Hydrochlorothiazide 20-12.5 Mg Tabs (Quinapril-Hydrochlorothiazide) .... Take 2 Tablet By Mouth Once A Day 5)  Aspirin 81 Mg Tbec (Aspirin) .... Take 1 Tablet By Mouth Once A Day 6)  Labetalol Hcl 200 Mg  Tabs (Labetalol Hcl) .... Take 2 Tabs By Mouth Two Times A Day 7)  Freestyle Lite   Strp (Glucose Blood) .... To Test Blood Sugar Twice Daily 8)  Freestyle Lancets   Misc (Lancets) .... To Test Blood Sugar Twice Daily 9)  Catapres 0.3 Mg Tabs (Clonidine Hcl) .... Take 1 Tablet By Mouth Two Times A Day 10)  Centrum  Tabs (Multiple Vitamins-Minerals) 11)  Amlodipine Besylate 10 Mg Tabs (Amlodipine Besylate) .... At Bedtime  Allergies (verified): No Known Drug Allergies  Review of Systems      See HPI  The patient denies anorexia, fever, hoarseness, chest pain, syncope, dyspnea on exertion, peripheral edema, and prolonged cough.  Physical Exam  General:  alert, well-developed, and well-nourished.  alert, well-developed, and well-nourished.   Head:  normocephalic and atraumatic.  normocephalic and atraumatic.   Eyes:  vision grossly intact, pupils equal, pupils round, and pupils reactive to light.  vision grossly intact, pupils equal, pupils round, and pupils reactive to light.   Mouth:  pharynx pink and moist.  pharynx pink and moist.   Neck:  supple, full ROM, and no masses.  supple, full ROM, and no masses.   Lungs:  normal respiratory effort, no intercostal retractions, no accessory muscle use, normal breath sounds, no dullness, no fremitus, no crackles, and no wheezes.  normal respiratory effort, no intercostal retractions, no accessory muscle use, normal breath sounds, no dullness, no fremitus, no crackles, and no wheezes.   Heart:  normal rate, regular rhythm, no murmur, no gallop, no rub, and no JVD.  normal rate, regular  rhythm, no murmur, no gallop, no rub, and no JVD.   Abdomen:  soft, non-tender, normal bowel sounds, no distention, no masses, no guarding, no rigidity, and no rebound tenderness.  soft, non-tender, normal bowel sounds, no distention, no masses, no guarding, no rigidity, and no rebound tenderness.   Rectal:  anal tags present,no external abnormalities, normal sphincter tone, no tenderness, and no fissures.   Pulses:  2+pulses b/l. Extremities:  no cyanosis, clubbing or edema. Neurologic:  alert & oriented X3, cranial nerves II-XII intact, strength normal in all extremities, sensation intact to light touch, gait normal, and DTRs symmetrical and normal.     Impression & Recommendations:  Problem # 1:  BLOOD IN STOOL (ICD-578.1) Assessment New She recently noticed blood in her stools on 2 occassions, that was described as bright red fresh blood, very small in amount on her tissue while wiping. She denies pruritis, burning, abdominal pain, N/V/D. Her rectal exam showed anal tags, no ext hemorhoid , ulcer or fissure. This is most likely from hemorrhoids. DD include anal fissure vs rectal ulcer vs diverticular bleed vs IBD vs polyps vs colon cancer. Colon cancer is very less ilkely in the absence of any sysstemic symptoms like weight loss, loss of appetite but will check her CBC to make sure she is not anemic.  Orders: T-CBC w/Diff ([id number])  Problem # 2:  DIABETES MELLITUS, TYPE II (ICD-250.00) Assessment: Deteriorated Her glucometer showed some  high readings but at the same time there were some low readings like 67 and 72. Her A1C is deteriorated from 7.6(9/11) to 8.5 today(02/05/10). But in the setting of low blood sugar readings,  will not make any changes in her diabetic regimen today. She has been scheduled an appointment with Jamison Neighbor in 1 week and was also adviced to see her primary care physician in 1 month. Her updated medication list for this problem includes:    Novolin 70/30 70-30  % Susp (Insulin isophane & regular) .Marland Kitchen... Please take 30 units in the morning with breakfast, and 20 units at night with dinner. it needs to be taken with food    Glucophage 1000 Mg Tabs (Metformin hcl) .Marland Kitchen... Take 1 tablet by mouth two times a day    Quinapril-hydrochlorothiazide 20-12.5 Mg Tabs (Quinapril-hydrochlorothiazide) .Marland Kitchen... Take 2 tablet by mouth once a day    Aspirin 81 Mg Tbec (Aspirin) .Marland Kitchen... Take 1 tablet by mouth once a day  Orders: T- Capillary Blood Glucose (82948) T-Hgb A1C (in-house) ([id number])  Problem # 3:  HYPERTENSION (ICD-401.9) Assessment: Comment Only Though when she presented initially to the clinic her  BP was high , (160/80) but was 120/80 on rechecking in 10 mins. Continue curent regimen for now.Will check BMET today. Her updated medication list for this problem includes:    Quinapril-hydrochlorothiazide 20-12.5 Mg Tabs (Quinapril-hydrochlorothiazide) .Marland Kitchen... Take 2 tablet by mouth once a day    Labetalol Hcl 200 Mg Tabs (Labetalol hcl) .Marland Kitchen... Take 2 tabs by mouth two times a day    Catapres 0.3 Mg Tabs (Clonidine hcl) .Marland Kitchen... Take 1 tablet by mouth two times a day    Amlodipine Besylate 10 Mg Tabs (Amlodipine besylate) .Marland Kitchen... At bedtime  Orders: T-Basic Metabolic Panel [id number])  Complete Medication List: 1)  Novolin 70/30 70-30 % Susp (Insulin isophane & regular) .... Please take 30 units in the morning with breakfast, and 20 units at night with dinner. it needs to be taken with food 2)  Glucophage 1000 Mg Tabs (Metformin hcl) .... Take 1 tablet by mouth two times a day 3)  Lipitor 20 Mg Tabs (Atorvastatin calcium) .... Take 1 tablet by mouth once a day 4)  Quinapril-hydrochlorothiazide 20-12.5 Mg Tabs (Quinapril-hydrochlorothiazide) .... Take 2 tablet by mouth once a day 5)  Aspirin 81 Mg Tbec (Aspirin) .... Take 1 tablet by mouth once a day 6)  Labetalol Hcl 200 Mg Tabs (Labetalol hcl) .... Take 2 tabs by mouth two times a day 7)  Freestyle Lite Strp (Glucose  blood) .... To test blood sugar twice daily 8)  Freestyle Lancets Misc (Lancets) .... To test blood sugar twice daily 9)  Catapres 0.3 Mg Tabs (Clonidine hcl) .... Take 1 tablet by mouth two times a day 10)  Centrum Tabs (Multiple vitamins-minerals) 11)  Amlodipine Besylate 10 Mg Tabs (Amlodipine besylate) .... At bedtime  Patient Instructions: 1)  Please take your medicines as prescribed. 2)  Please schedule a follow-up appointmentwith PCP(Dr.Garg) in 1 month. 3)  Please schedule a follow up appointment with Jamison Neighbor in the next week. 4)  Please call the clinic if you notiice frank blood or your symptoms get worse.   Orders Added: 1)  T- Capillary Blood Glucose [82948] 2)  T-Hgb A1C (in-house) [[id number]] 3)  T-Basic Metabolic Panel [[id number]] 4)  T-CBC w/Diff [[id number]]    Prevention & Chronic Care Immunizations   Influenza vaccine: Fluvax 3+  (12/03/2009)   Influenza vaccine deferral: Deferred  (12/03/2009)   Influenza vaccine due: 11/15/2009    Tetanus booster: 12/03/2009: Tdap    Pneumococcal vaccine: Historical  (01/23/2000)  Other Screening   Pap smear: NEGATIVE FOR INTRAEPITHELIAL LESIONS OR MALIGNANCY.  (12/03/2009)   Pap smear action/deferral: Ordered  (12/03/2009)   Pap smear due: 12/07/2011    Mammogram: ASSESSMENT: Negative - BI-RADS 1^MM DIGITAL SCREENING  (04/24/2009)   Mammogram due: 04/18/2009   Smoking status: quit  (02/05/2010)  Diabetes Mellitus   HgbA1C: 8.5  (02/05/2010)   Hemoglobin A1C due: 07/03/2008    Eye exam: No diabetic retinopathy.     (09/18/2009)   Diabetic eye exam action/deferral: Ophthalmology referral  (06/11/2009)   Eye exam due: 09/2010    Foot exam: yes  (12/03/2009)   Foot exam action/deferral: Do today   High risk foot: Yes  (12/03/2009)   Foot care education: Done  (12/03/2009)    Urine microalbumin/creatinine ratio: 65.1  (03/06/2009)    Diabetes flowsheet reviewed?: Yes   Progress toward A1C goal:  Deteriorated  Lipids   Total Cholesterol: 116  (08/14/2009)   LDL: 52  (08/14/2009)   LDL Direct: Not documented   HDL: 43  (08/14/2009)  Triglycerides: 107  (08/14/2009)    SGOT (AST): 16  (08/14/2009)   SGPT (ALT): 17  (08/14/2009)   Alkaline phosphatase: 66  (08/14/2009)   Total bilirubin: 0.6  (08/14/2009)  Hypertension   Last Blood Pressure: 110 / 70  (02/05/2010)   Serum creatinine: 1.08  (08/14/2009)   Serum potassium 4.5  (08/14/2009)  Self-Management Support :   Personal Goals (by the next clinic visit) :     Personal A1C goal: 7  (07/03/2009)     Personal blood pressure goal: 130/80  (07/03/2009)     Personal LDL goal: 100  (07/03/2009)    Patient will work on the following items until the next clinic visit to reach self-care goals:     Medications and monitoring: take my medicines every day, check my blood sugar, examine my feet every day  (02/05/2010)     Eating: eat more vegetables, eat foods that are low in salt, eat baked foods instead of fried foods, eat fruit for snacks and desserts  (02/05/2010)     Activity: join a walking program  (02/05/2010)    Diabetes self-management support: Education handout  (02/05/2010)   Diabetes education handout printed   Last diabetes self-management training by diabetes educator: 05/02/2008   Last medical nutrition therapy: 12/22/2007    Hypertension self-management support: Education handout  (02/05/2010)   Hypertension education handout printed    Lipid self-management support: Education handout  (02/05/2010)     Lipid education handout printed   Nursing Instructions: Diabetic foot exam today    Process Orders Check Orders Results:     [facility]: ABN not required for this insurance Tests Sent for requisitioning (February 06, 2010 8:25 AM):     02/05/2010: [facility] -- T-Basic Metabolic Panel [id number] (signed)     02/05/2010: [facility] -- [facility] w/Diff  [[id number]] (signed)    Laboratory Results   Blood Tests   Date/Time Received: February 05, 2010 8:59 AM  Date/Time Reported: Burke Keels  February 05, 2010 8:59 AM   HGBA1C: 8.5%   (Normal Range: Non-Diabetic - 3-6%   Control Diabetic - 6-8%) CBG Fasting:: 129mg /dL

## 2010-04-18 NOTE — Assessment & Plan Note (Signed)
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

## 2010-04-18 NOTE — Progress Notes (Signed)
Summary: Dental clinic - no antibiotics needed  Phone Note From Other Clinic   Caller: Dental clinic Summary of Call: Angie from Dental clinic called to ck if pt needs antibiotic for cleaning teeth sch 04/04/09. Talked with Dr Clent Ridges - no antibiotics are needed for cleaning teeth.

## 2010-04-18 NOTE — Assessment & Plan Note (Signed)
Summary: RECK BP/EST/VS   Vital Signs:  Patient profile:   50 year old female Height:      68 inches (172.72 cm) Weight:      259.0 pounds (117.73 kg) BMI:     39.52 Temp:     97.6 degrees F oral Pulse rate:   90 / minute BP sitting:   151 / 93  (right arm) Cuff size:   large  Vitals Entered By: Chinita Pester RN (July 03, 2009 9:19 AM) CC: BP re-check Is Patient Diabetic? Yes Did you bring your meter with you today? No Pain Assessment Patient in pain? no      Nutritional Status BMI of > 30 = obese CBG Result 175  Have you ever been in a relationship where you felt threatened, hurt or afraid?No   Does patient need assistance? Functional Status Self care Ambulation Normal   Primary Care Provider:  Valetta Close MD  CC:  BP re-check.  History of Present Illness: 49 yof with pmh outlined in chart here for BP recheck after increasing hydralazine.  She has no complaints and is in a hurry to leave.  She has not had any adverse effects to the increased dose of hydralazine, has not noticed any change at all.  Current Medications (verified): 1)  Novolin 70/30 70-30 % Susp (Insulin Isophane & Regular) .... Please Take 30 Units in The Morning With Breakfast, and 20 Units At Night With Dinner. It Needs To Be Taken With Food 2)  Glucophage 1000 Mg Tabs (Metformin Hcl) .... Take 1 Tablet By Mouth Two Times A Day 3)  Lipitor 20 Mg  Tabs (Atorvastatin Calcium) .... Take 1 Tablet By Mouth Once A Day 4)  Lisinopril-Hydrochlorothiazide 20-12.5 Mg Tabs (Lisinopril-Hydrochlorothiazide) .... Take 2 Tabs By Mouth Once Daily 5)  Hydralazine Hcl 50 Mg Tabs (Hydralazine Hcl) .... Take One Tablet Two Times A Day For Blood Pressure. 6)  Aspirin 81 Mg Tbec (Aspirin) .... Take 1 Tablet By Mouth Once A Day 7)  Labetalol Hcl 200 Mg  Tabs (Labetalol Hcl) .... Take 2 Tabs By Mouth Two Times A Day 8)  Freestyle Lite   Strp (Glucose Blood) .... To Test Blood Sugar Twice Daily 9)  Freestyle Lancets    Misc (Lancets) .... To Test Blood Sugar Twice Daily 10)  Catapres 0.3 Mg Tabs (Clonidine Hcl) .... Take 1 Tablet By Mouth Two Times A Day 11)  Centrum  Tabs (Multiple Vitamins-Minerals)  Allergies (verified): No Known Drug Allergies  Review of Systems       per hpi  Physical Exam  General:  alert and overweight-appearing.   Psych:  Oriented X3, memory intact for recent and remote, normally interactive, good eye contact, and not anxious appearing.     Impression & Recommendations:  Problem # 1:  HYPERTENSION (ICD-401.9)  increased hydralazine, BP improved. still above goal, but I am OK with this for now.  She is going to try some lifestyle modifications to help decrease BP even more.  We discussed diet, weight loss and avoidance of salty foods.    Her updated medication list for this problem includes:    Lisinopril-hydrochlorothiazide 20-12.5 Mg Tabs (Lisinopril-hydrochlorothiazide) .Marland Kitchen... Take 2 tabs by mouth once daily    Hydralazine Hcl 50 Mg Tabs (Hydralazine hcl) .Marland Kitchen... Take one tablet two times a day for blood pressure.    Labetalol Hcl 200 Mg Tabs (Labetalol hcl) .Marland Kitchen... Take 2 tabs by mouth two times a day    Catapres 0.3 Mg Tabs (Clonidine  hcl) ..... Take 1 tablet by mouth two times a day  BP today: 151/93 Prior BP: 183/102 (06/11/2009)  Labs Reviewed: K+: 4.5 (07/20/2008) Creat: : 0.87 (07/20/2008)   Chol: 120 (07/20/2008)   HDL: 60 (07/20/2008)   LDL: 51 (07/20/2008)   TG: 47 (07/20/2008)  Problem # 2:  HYPERLIPIDEMIA (ICD-272.4)  will recheck lipids in June.  she will be fasting when she comes in for that appointment, I will put in the orders.  Her updated medication list for this problem includes:    Lipitor 20 Mg Tabs (Atorvastatin calcium) .Marland Kitchen... Take 1 tablet by mouth once a day  Labs Reviewed: SGOT: 23 (07/20/2008)   SGPT: 24 (07/20/2008)   HDL:60 (07/20/2008), 42 (11/05/2007)  LDL:51 (07/20/2008), 61 (11/05/2007)  Chol:120 (07/20/2008), 127 (11/05/2007)   Trig:47 (07/20/2008), 120 (11/05/2007)  Future Orders: T-Comprehensive Metabolic Panel ([id number]) ... 08/30/2009 T-Lipid Profile [id number]) ... 08/30/2009  Problem # 3:  OBESITY (ICD-278.00) discussed weight loss, exercise.  Problem # 4:  DIABETES MELLITUS, TYPE II (ICD-250.00) cbg today is 88, perfect. has an appointment with DR next week.  Her updated medication list for this problem includes:    Novolin 70/30 70-30 % Susp (Insulin isophane & regular) .Marland Kitchen... Please take 30 units in the morning with breakfast, and 20 units at night with dinner. it needs to be taken with food    Glucophage 1000 Mg Tabs (Metformin hcl) .Marland Kitchen... Take 1 tablet by mouth two times a day    Lisinopril-hydrochlorothiazide 20-12.5 Mg Tabs (Lisinopril-hydrochlorothiazide) .Marland Kitchen... Take 2 tabs by mouth once daily    Aspirin 81 Mg Tbec (Aspirin) .Marland Kitchen... Take 1 tablet by mouth once a day  Orders: Capillary Blood Glucose/CBG ([id number])  Complete Medication List: 1)  Novolin 70/30 70-30 % Susp (Insulin isophane & regular) .... Please take 30 units in the morning with breakfast, and 20 units at night with dinner. it needs to be taken with food 2)  Glucophage 1000 Mg Tabs (Metformin hcl) .... Take 1 tablet by mouth two times a day 3)  Lipitor 20 Mg Tabs (Atorvastatin calcium) .... Take 1 tablet by mouth once a day 4)  Lisinopril-hydrochlorothiazide 20-12.5 Mg Tabs (Lisinopril-hydrochlorothiazide) .... Take 2 tabs by mouth once daily 5)  Hydralazine Hcl 50 Mg Tabs (Hydralazine hcl) .... Take one tablet two times a day for blood pressure. 6)  Aspirin 81 Mg Tbec (Aspirin) .... Take 1 tablet by mouth once a day 7)  Labetalol Hcl 200 Mg Tabs (Labetalol hcl) .... Take 2 tabs by mouth two times a day 8)  Freestyle Lite Strp (Glucose blood) .... To test blood sugar twice daily 9)  Freestyle Lancets Misc (Lancets) .... To test blood sugar twice daily 10)  Catapres 0.3 Mg Tabs (Clonidine hcl) .... Take 1 tablet by mouth two times a  day 11)  Centrum Tabs (Multiple vitamins-minerals)  Patient Instructions: 1)  Please make a Diabetes appointment in June. 2)  Your blood pressure is improved today!  You should exercise 4-5 times a week, try walking for 30 minutes. 3)  Avoid salty foods to lower your blood pressure.  Prevention & Chronic Care Immunizations   Influenza vaccine: Fluvax Non-MCR  (12/06/2008)    Tetanus booster: Not documented    Pneumococcal vaccine: Not documented  Other Screening   Pap smear: NEGATIVE FOR INTRAEPITHELIAL LESIONS OR MALIGNANCY.  (05/16/2008)   Pap smear due: 12/07/2011    Mammogram: ASSESSMENT: Negative - BI-RADS 1^MM DIGITAL SCREENING  (04/24/2009)   Mammogram due: 04/18/2009   Smoking status:  quit  (06/11/2009)  Diabetes Mellitus   HgbA1C: 8.0  (06/11/2009)   Hemoglobin A1C due: 07/03/2008    Eye exam: No diabetic retinopathy.   Visual acuity OD (best corrected):     20/20 Visual acuity OS (best corrected):     20/20 Intraocular pressure OD:     12 Intraocular pressure OS:     12   (06/06/2008)   Diabetic eye exam action/deferral: Ophthalmology referral  (06/11/2009)   Eye exam due: 06/2009    Foot exam: yes  (03/06/2009)   High risk foot: Not documented   Foot care education: Not documented    Urine microalbumin/creatinine ratio: 65.1  (03/06/2009)    Diabetes flowsheet reviewed?: Yes   Progress toward A1C goal: Unchanged  Lipids   Total Cholesterol: 120  (07/20/2008)   LDL: 51  (07/20/2008)   LDL Direct: Not documented   HDL: 60  (07/20/2008)   Triglycerides: 47  (07/20/2008)    SGOT (AST): 23  (07/20/2008)   SGPT (ALT): 24  (07/20/2008) CMP ordered    Alkaline phosphatase: 65  (07/20/2008)   Total bilirubin: 0.4  (07/20/2008)    Lipid flowsheet reviewed?: Yes   Progress toward LDL goal: Unchanged  Hypertension   Last Blood Pressure: 151 / 93  (07/03/2009)   Serum creatinine: 0.87  (07/20/2008)   Serum potassium 4.5  (07/20/2008) CMP ordered      Hypertension flowsheet reviewed?: Yes   Progress toward BP goal: Improved  Self-Management Support :   Personal Goals (by the next clinic visit) :     Personal A1C goal: 7  (07/03/2009)     Personal blood pressure goal: 130/80  (07/03/2009)     Personal LDL goal: 100  (07/03/2009)    Patient will work on the following items until the next clinic visit to reach self-care goals:     Medications and monitoring: check my blood pressure, examine my feet every day  (07/03/2009)     Eating: eat more vegetables, use fresh or frozen vegetables, eat foods that are low in salt, eat baked foods instead of fried foods, eat fruit for snacks and desserts  (07/03/2009)     Activity: take a 30 minute walk every day, take the stairs instead of the elevator, park at the far end of the parking lot  (07/03/2009)    Diabetes self-management support: Resources for patients handout, Written self-care plan  (07/03/2009)   Diabetes care plan printed   Last diabetes self-management training by diabetes educator: 05/02/2008   Last medical nutrition therapy: 12/22/2007    Hypertension self-management support: Resources for patients handout, Written self-care plan  (07/03/2009)   Hypertension self-care plan printed.    Lipid self-management support: Resources for patients handout, Written self-care plan  (07/03/2009)   Lipid self-care plan printed.      Resource handout printed.  Process Orders Check Orders Results:     [facility]: ABN not required for this insurance Tests Sent for requisitioning (July 03, 2009 9:52 AM):     08/30/2009: [facility] -- T-Comprehensive Metabolic Panel [[id number]] (signed)     08/30/2009: [facility] -- T-Lipid Profile [id number] (signed)

## 2010-04-18 NOTE — Progress Notes (Signed)
Summary: new med. Rxs/gp  Phone Note Refill Request Message from:  Patient on October 08, 2009 12:34 PM  Refills Requested: Medication #1:  HYDRALAZINE HCL 50 MG TABS Take one tablet two times a day for blood pressure.   Dosage confirmed as above?Dosage Confirmed   Brand Name Necessary? No   Supply Requested: 1 year  Medication #2:  ASPIRIN 81 MG TBEC Take 1 tablet by mouth once a day   Dosage confirmed as above?Dosage Confirmed   Brand Name Necessary? No   Supply Requested: 1 year  Medication #3:  LABETALOL HCL 200 MG  TABS take 2 tabs by mouth two times a day   Dosage confirmed as above?Dosage Confirmed   Brand Name Necessary? No   Supply Requested: 1 year  Medication #4:  FREESTYLE LITE   STRP to test blood sugar twice daily   Dosage confirmed as above?Dosage Confirmed   Brand Name Necessary? No   Supply Requested: 1 year    MEDICATION #5  Freestyle Lancets   Method Requested: Fax to Local Pharmacy Initial call taken by: Chinita Pester RN,  October 08, 2009 12:34 PM  Follow-up for Phone Call        Refill approved-nurse to complete Follow-up by: Lars Mage MD,  October 09, 2009 3:54 PM    Prescriptions: LABETALOL HCL 200 MG  TABS (LABETALOL HCL) take 2 tabs by mouth two times a day  #120 x 11   Entered and Authorized by:   Lars Mage MD   Signed by:   Lars Mage MD on 10/09/2009   Method used:   Electronically to        [facility] (retail)       [address]       [place], [place]  [address]       Ph: [phone]       Fax: [phone]   RxID:   [id number] ASPIRIN 81 MG TBEC (ASPIRIN) Take 1 tablet by mouth once a day  #93 x 6   Entered and Authorized by:   Lars Mage MD   Signed by:   Lars Mage MD on 10/09/2009   Method used:   Electronically to        [facility] (retail)       [address]       [place], [place]  [address]       Ph: [phone]       Fax: [phone]   RxID:    [id number] HYDRALAZINE HCL 50 MG TABS (HYDRALAZINE HCL) Take one tablet two times a day for blood pressure.  #180 x 3   Entered and Authorized by:   Lars Mage MD   Signed by:   Lars Mage MD on 10/09/2009   Method used:   Electronically to        [facility] (retail)       [address]       [place], [place]  [address]       Ph: [phone]       Fax: [phone]   RxID:   [id number] FREESTYLE LITE   STRP (GLUCOSE BLOOD) to test blood sugar twice daily  #1 month supp x 11   Entered and Authorized by:   Lars Mage MD   Signed by:   Hayze Gazda Eben Burow  MD on 10/09/2009   Method used:   Electronically to        [facility] (retail)       [address]       [place], [place]  [address]       Ph: [phone]       Fax: [phone]   RxID:   [id number] FREESTYLE LANCETS   MISC (LANCETS) to test blood sugar twice daily  #1 month supp x 11   Entered and Authorized by:   Lars Mage MD   Signed by:   Lars Mage MD on 10/09/2009   Method used:   Electronically to        [facility] (retail)       [address]       [place], [place]  [address]       Ph: [phone]       Fax: [phone]   RxID:   [id number]

## 2010-04-18 NOTE — Assessment & Plan Note (Signed)
Summary: EST-CK/FU/MEDS/CFB                                                           Vital Signs:  Patient profile:   50 year old female Height:      68 inches (172.72 cm) Weight:      264.0 pounds (120.00 kg) BMI:     40.29 Temp:     97.7 degrees F (36.50 degrees C) oral Pulse rate:   86 / minute BP sitting:   138 / 90  (right arm) Cuff size:   large  Vitals Entered By: Cynda Familia Duncan Dull) (March 18, 2010 1:56 PM) CC: routine f/u, refill on insulin Is Patient Diabetic? Yes Did you bring your meter with you today? Yes Pain Assessment Patient in pain? no      Nutritional Status BMI of > 30 = obese  Have you ever been in a relationship where you felt threatened, hurt or afraid?No   Does patient need assistance? Functional Status Self care Ambulation Normal   Primary Care Provider:  Lars Mage MD  CC:  routine f/u and refill on insulin.  History of Present Illness: 50 y/o woman with PMH significant for HTN, Type 2 DM, comes to the clinic today for a regular follow up.  Her DM is well controlled with last Hba1c 8.5 which was a little high. She has seen Jamison Neighbor since and been keeping a closer monitoring of her blood sugars.   BP is high in clinic readings but she shows me her readings at home and they look well controlled. The rpeat BP is 138/90.  Asks me about manicure and pedicure, if they are ok to do though she is diabetic.  hasnt had any bleeding anymore, only 2 episodes as documented in Nov visit.  Got tetanus shot today.      Preventive Screening-Counseling & Management  Alcohol-Tobacco     Alcohol drinks/day: 0     Smoking Status: quit     Year Quit: 1984     Passive Smoke Exposure: no  Problems Prior to Update: 1)  Blood in Stool  (ICD-578.1) 2)  Irregular Menses  (ICD-626.4) 3)  Obesity  (ICD-278.00) 4)  Hypertension  (ICD-401.9) 5)  Hyperlipidemia  (ICD-272.4) 6)  Diabetes Mellitus, Type II  (ICD-250.00) 7)  Cellulitis and Abscess  of Unspecified Digit  (ICD-681.9) 8)  Boils, Recurrent  (ICD-680.9) 9)  Preventive Health Care  (ICD-V70.0)  Medications Prior to Update: 1)  Humulin 70/30 70-30 % Susp (Insulin Isophane & Regular) .... Inject 30 Units in Am and 20 Units in Pm With Food 2)  Glucophage 1000 Mg Tabs (Metformin Hcl) .... Take 1 Tablet By Mouth Two Times A Day 3)  Lipitor 20 Mg  Tabs (Atorvastatin Calcium) .... Take 1 Tablet By Mouth Once A Day 4)  Quinapril-Hydrochlorothiazide 20-12.5 Mg Tabs (Quinapril-Hydrochlorothiazide) .... Take 2 Tablet By Mouth Once A Day 5)  Aspirin 81 Mg Tbec (Aspirin) .... Take 1 Tablet By Mouth Once A Day 6)  Labetalol Hcl 200 Mg  Tabs (Labetalol Hcl) .... Take 2 Tabs By Mouth Two Times A Day 7)  Freestyle Lite   Strp (Glucose Blood) .... To Test Blood Sugar Twice Daily 8)  Freestyle Lancets   Misc (Lancets) .... To Test Blood Sugar Twice Daily 9)  Catapres 0.3 Mg Tabs (Clonidine Hcl) .... Take 1 Tablet By Mouth Two Times A Day 10)  Centrum  Tabs (Multiple Vitamins-Minerals) 11)  Amlodipine Besylate 10 Mg Tabs (Amlodipine Besylate) .... At Bedtime  Current Medications (verified): 1)  Humulin 70/30 70-30 % Susp (Insulin Isophane & Regular) .... Inject 30 Units in Am and 20 Units in Pm With Food 2)  Glucophage 1000 Mg Tabs (Metformin Hcl) .... Take 1 Tablet By Mouth Two Times A Day 3)  Lipitor 20 Mg  Tabs (Atorvastatin Calcium) .... Take 1 Tablet By Mouth Once A Day 4)  Quinapril-Hydrochlorothiazide 20-12.5 Mg Tabs (Quinapril-Hydrochlorothiazide) .... Take 2 Tablet By Mouth Once A Day 5)  Aspirin 81 Mg Tbec (Aspirin) .... Take 1 Tablet By Mouth Once A Day 6)  Labetalol Hcl 200 Mg  Tabs (Labetalol Hcl) .... Take 2 Tabs By Mouth Two Times A Day 7)  Freestyle Lite   Strp (Glucose Blood) .... To Test Blood Sugar Twice Daily 8)  Freestyle Lancets   Misc (Lancets) .... To Test Blood Sugar Twice Daily 9)  Catapres 0.3 Mg Tabs (Clonidine Hcl) .... Take 1 Tablet By Mouth Two Times A Day 10)   Centrum  Tabs (Multiple Vitamins-Minerals) 11)  Amlodipine Besylate 10 Mg Tabs (Amlodipine Besylate) .... At Bedtime  Allergies (verified): No Known Drug Allergies  Past History:  Past Medical History: Last updated: 01/05/2008 "She has had her tubes tied" Her blood pressure is generally much higher when she initially sees me, and then improves over the course of the clinic, though since 09/09, it has remained elevated.  Family History: Last updated: 11/05/2007 HTN and DM runs in the family. No history of colon cancer  Social History: Last updated: 03/06/2009 Single Lives at home with daughter She believes with fair certainty that she is in a monogomous relationship with her boyfriend.   works [employer]  Risk Factors: Alcohol Use: 0 (03/18/2010) Exercise: yes (02/05/2010)  Risk Factors: Smoking Status: quit (03/18/2010) Passive Smoke Exposure: no (03/18/2010)  Family History: Reviewed history from 11/05/2007 and no changes required. HTN and DM runs in the family. No history of colon cancer  Social History: Reviewed history from 03/06/2009 and no changes required. Single Lives at home with daughter She believes with fair certainty that she is in a monogomous relationship with her boyfriend.   works [employer]  Review of Systems      See HPI  Physical Exam  Additional Exam:  Gen: AOx3, in no acute distress Eyes: PERRL, EOMI ENT:MMM, No erythema noted in posterior pharynx Neck: No JVD, No LAP Chest: CTAB with  good respiratory effort CVS: regular rhythmic rate, NO M/R/G, S1 S2 normal Abdo: soft,ND, BS+x4, Non tender and No hepatosplenomegaly EXT: No odema noted Neuro: Non focal, gait is normal Skin: no rashes noted.    Impression & Recommendations:  Problem # 1:  DIABETES MELLITUS, TYPE II (ICD-250.00) Assessment Improved Seen Jamison Neighbor since november and has been taking good care of her diet and has been compliant  with her meds. HBa1c in 2 months. Already on Ace'i. Optha exam recent. Follow up in 2 months.  Her updated medication list for this problem includes:    Humulin 70/30 70-30 % Susp (Insulin isophane & regular) ..... Inject 30 units in am and 20 units in pm with food    Glucophage 1000 Mg Tabs (Metformin hcl) .Marland Kitchen... Take 1 tablet by mouth two times a day    Quinapril-hydrochlorothiazide 20-12.5 Mg Tabs (Quinapril-hydrochlorothiazide) .[facility]  Take 2 tablet by mouth once a day    Aspirin 81 Mg Tbec (Aspirin) .Marland Kitchen... Take 1 tablet by mouth once a day  Labs Reviewed: Creat: 1.07 (02/05/2010)     Last Eye Exam: No diabetic retinopathy.    (09/18/2009) Reviewed HgBA1c results: 8.5 (02/05/2010)  7.6 (12/03/2009)  Problem # 2:  HYPERTENSION (ICD-401.9) Assessment: Unchanged At goal. Her updated medication list for this problem includes:    Quinapril-hydrochlorothiazide 20-12.5 Mg Tabs (Quinapril-hydrochlorothiazide) .Marland Kitchen... Take 2 tablet by mouth once a day    Labetalol Hcl 200 Mg Tabs (Labetalol hcl) .Marland Kitchen... Take 2 tabs by mouth two times a day    Catapres 0.3 Mg Tabs (Clonidine hcl) .Marland Kitchen... Take 1 tablet by mouth two times a day    Amlodipine Besylate 10 Mg Tabs (Amlodipine besylate) .Marland Kitchen... At bedtime  BP today: 138/90 Prior BP: 110/70 (02/05/2010)  Labs Reviewed: K+: 4.3 (02/05/2010) Creat: : 1.07 (02/05/2010)   Chol: 116 (08/14/2009)   HDL: 43 (08/14/2009)   LDL: 52 (08/14/2009)   TG: 107 (08/14/2009)  Problem # 3:  HYPERLIPIDEMIA (ICD-272.4) Assessment: Unchanged At goal Her updated medication list for this problem includes:    Lipitor 20 Mg Tabs (Atorvastatin calcium) .Marland Kitchen... Take 1 tablet by mouth once a day  Labs Reviewed: SGOT: 16 (08/14/2009)   SGPT: 17 (08/14/2009)   HDL:43 (08/14/2009), 60 (07/20/2008)  LDL:52 (08/14/2009), 51 (07/20/2008)  Chol:116 (08/14/2009), 120 (07/20/2008)  Trig:107 (08/14/2009), 47 (07/20/2008)  Problem # 4:  PREVENTIVE HEALTH CARE (ICD-V70.0) Assessment:  Comment Only Tetanus shot today.  Problem # 5:  BLOOD IN STOOL (ICD-578.1) Assessment: Improved No blood noted after those 2 episodes. No family h/o colon cancer. CBC normal at >12. Follow up as needed.  Complete Medication List: 1)  Humulin 70/30 70-30 % Susp (Insulin isophane & regular) .... Inject 30 units in am and 20 units in pm with food 2)  Glucophage 1000 Mg Tabs (Metformin hcl) .... Take 1 tablet by mouth two times a day 3)  Lipitor 20 Mg Tabs (Atorvastatin calcium) .... Take 1 tablet by mouth once a day 4)  Quinapril-hydrochlorothiazide 20-12.5 Mg Tabs (Quinapril-hydrochlorothiazide) .... Take 2 tablet by mouth once a day 5)  Aspirin 81 Mg Tbec (Aspirin) .... Take 1 tablet by mouth once a day 6)  Labetalol Hcl 200 Mg Tabs (Labetalol hcl) .... Take 2 tabs by mouth two times a day 7)  Freestyle Lite Strp (Glucose blood) .... To test blood sugar twice daily 8)  Freestyle Lancets Misc (Lancets) .... To test blood sugar twice daily 9)  Catapres 0.3 Mg Tabs (Clonidine hcl) .... Take 1 tablet by mouth two times a day 10)  Centrum Tabs (Multiple vitamins-minerals) 11)  Amlodipine Besylate 10 Mg Tabs (Amlodipine besylate) .... At bedtime  Patient Instructions: 1)  Please schedule a follow-up appointment in 6 months. 2)  It is important that you exercise regularly at least 20 minutes 5 times a week. If you develop chest pain, have severe difficulty breathing, or feel very tired , stop exercising immediately and seek medical attention. 3)  You need to lose weight. Consider a lower calorie diet and regular exercise.  4)  It is important that your Diabetic A1c level is checked every 3-6 months. 5)  Check your Blood Pressure regularly. If it is above:160/100 consistently you should make an appointment.   Orders Added: 1)  Est. Patient Level IV [52841]     Prevention & Chronic Care Immunizations   Influenza vaccine: Fluvax 3+  (12/03/2009)  Influenza vaccine deferral: Deferred   (12/03/2009)   Influenza vaccine due: 11/15/2009    Tetanus booster: 12/03/2009: Tdap    Pneumococcal vaccine: Historical  (01/23/2000)  Other Screening   Pap smear: NEGATIVE FOR INTRAEPITHELIAL LESIONS OR MALIGNANCY.  (12/03/2009)   Pap smear action/deferral: Ordered  (12/03/2009)   Pap smear due: 12/07/2011    Mammogram: ASSESSMENT: Negative - BI-RADS 1^MM DIGITAL SCREENING  (04/24/2009)   Mammogram due: 04/18/2009   Smoking status: quit  (03/18/2010)  Diabetes Mellitus   HgbA1C: 8.5  (02/05/2010)   Hemoglobin A1C due: 07/03/2008    Eye exam: No diabetic retinopathy.     (09/18/2009)   Diabetic eye exam action/deferral: Ophthalmology referral  (06/11/2009)   Eye exam due: 09/2010    Foot exam: yes  (12/03/2009)   Foot exam action/deferral: Deferred   High risk foot: Yes  (12/03/2009)   Foot care education: Done  (12/03/2009)    Urine microalbumin/creatinine ratio: 65.1  (03/06/2009)   Urine microalbumin action/deferral: Not indicated    Diabetes flowsheet reviewed?: Yes   Progress toward A1C goal: Unchanged  Lipids   Total Cholesterol: 116  (08/14/2009)   LDL: 52  (08/14/2009)   LDL Direct: Not documented   HDL: 43  (08/14/2009)   Triglycerides: 107  (08/14/2009)    SGOT (AST): 16  (08/14/2009)   SGPT (ALT): 17  (08/14/2009)   Alkaline phosphatase: 66  (08/14/2009)   Total bilirubin: 0.6  (08/14/2009)    Lipid flowsheet reviewed?: Yes   Progress toward LDL goal: At goal  Hypertension   Last Blood Pressure: 138 / 90  (03/18/2010)   Serum creatinine: 1.07  (02/05/2010)   Serum potassium 4.3  (02/05/2010)    Hypertension flowsheet reviewed?: Yes   Progress toward BP goal: At goal  Self-Management Support :   Personal Goals (by the next clinic visit) :     Personal A1C goal: 7  (07/03/2009)     Personal blood pressure goal: 130/80  (07/03/2009)     Personal LDL goal: 100  (07/03/2009)    Diabetes self-management support: Written self-care plan   (03/18/2010)   Diabetes care plan printed   Last diabetes self-management training by diabetes educator: 05/02/2008   Last medical nutrition therapy: 12/22/2007    Hypertension self-management support: Written self-care plan  (03/18/2010)   Hypertension self-care plan printed.    Lipid self-management support: Written self-care plan  (03/18/2010)   Lipid self-care plan printed.

## 2010-04-18 NOTE — Consult Note (Signed)
Summary: [facility]   Imported By: Margie Billet 09/24/2009 09:13:00  _____________________________________________________________________  External Attachment:    Type:   Image     Comment:   External Document  Appended Document: [facility]   Diabetic Eye Exam  Procedure date:  09/18/2009  Findings:      No diabetic retinopathy.     Procedures Next Due Date:    Diabetic Eye Exam: 09/2010   Diabetic Eye Exam  Procedure date:  09/18/2009  Findings:      No diabetic retinopathy.     Procedures Next Due Date:    Diabetic Eye Exam: 09/2010

## 2010-04-25 ENCOUNTER — Other Ambulatory Visit: Payer: Self-pay | Admitting: *Deleted

## 2010-04-29 MED ORDER — ATORVASTATIN CALCIUM 10 MG PO TABS: 10.0000 mg | ORAL_TABLET | Freq: Every day | ORAL | Status: DC

## 2010-04-30 ENCOUNTER — Ambulatory Visit (HOSPITAL_COMMUNITY): Admission: RE | Admit: 2010-04-30 | Payer: Self-pay | Source: Home / Self Care | Admitting: Internal Medicine

## 2010-04-30 ENCOUNTER — Ambulatory Visit (HOSPITAL_COMMUNITY): Payer: Self-pay

## 2010-05-07 ENCOUNTER — Other Ambulatory Visit: Payer: Self-pay | Admitting: *Deleted

## 2010-05-07 ENCOUNTER — Ambulatory Visit (HOSPITAL_COMMUNITY)
Admission: RE | Admit: 2010-05-07 | Discharge: 2010-05-07 | Disposition: A | Discharge status: Home / Self Care | Payer: Self-pay | Source: Ambulatory Visit | Attending: Internal Medicine | Admitting: Internal Medicine

## 2010-05-07 DIAGNOSIS — Z1231 Encounter for screening mammogram for malignant neoplasm of breast: Secondary | ICD-10-CM | POA: Insufficient documentation

## 2010-05-13 MED ORDER — ATORVASTATIN CALCIUM 20 MG PO TABS: 20.0000 mg | ORAL_TABLET | Freq: Every day | ORAL | Status: DC

## 2010-05-27 LAB — GLUCOSE, CAPILLARY: Glucose-Capillary: 128 mg/dL — ABNORMAL HIGH (ref 70–99)

## 2010-06-01 LAB — POCT PREGNANCY, URINE: Preg Test, Ur: NEGATIVE

## 2010-06-02 LAB — URINE MICROSCOPIC-ADD ON

## 2010-06-02 LAB — GC/CHLAMYDIA PROBE AMP, URINE
Chlamydia, Swab/Urine, PCR: NEGATIVE
GC Probe Amp, Urine: NEGATIVE

## 2010-06-02 LAB — URINALYSIS, ROUTINE W REFLEX MICROSCOPIC
Nitrite: NEGATIVE
Specific Gravity, Urine: >1.03 — ABNORMAL HIGH (ref 1.005–1.030)
Urobilinogen, UA: 0.2 mg/dL (ref 0.0–1.0)

## 2010-06-02 LAB — WET PREP, GENITAL: Trich, Wet Prep: NONE SEEN

## 2010-06-03 LAB — GLUCOSE, CAPILLARY: Glucose-Capillary: 104 mg/dL — ABNORMAL HIGH (ref 70–99)

## 2010-06-04 LAB — GLUCOSE, CAPILLARY

## 2010-06-07 ENCOUNTER — Other Ambulatory Visit: Payer: Self-pay | Admitting: *Deleted

## 2010-06-07 MED ORDER — QUINAPRIL-HYDROCHLOROTHIAZIDE 20-12.5 MG PO TABS: 2.0000 | ORAL_TABLET | Freq: Every day | ORAL | Status: DC

## 2010-06-07 NOTE — Telephone Encounter (Signed)
Faxed to [facility].

## 2010-06-07 NOTE — Telephone Encounter (Signed)
Medication is no longer available on formulary.  Therapeutic interchange to Accuretic 20/12.5mg 

## 2010-06-10 LAB — GLUCOSE, CAPILLARY: Glucose-Capillary: 273 mg/dL — ABNORMAL HIGH (ref 70–99)

## 2010-06-17 LAB — GLUCOSE, CAPILLARY: Glucose-Capillary: 93 mg/dL (ref 70–99)

## 2010-06-25 LAB — GLUCOSE, CAPILLARY: Glucose-Capillary: 110 mg/dL — ABNORMAL HIGH (ref 70–99)

## 2010-07-01 LAB — GLUCOSE, CAPILLARY: Glucose-Capillary: 119 mg/dL — ABNORMAL HIGH (ref 70–99)

## 2010-07-30 NOTE — Procedures (Signed)
RENAL ARTERY DUPLEX EVALUATION   INDICATION:  Hypertension.   HISTORY:  Diabetes:  Yes.  Cardiac:  No.  Hypertension:  Yes.  Smoking:  Previous.   RENAL ARTERY DUPLEX FINDINGS:  Aorta-Proximal:  112 cm/s  Aorta-Mid:  113 cm/s  Aorta-Distal:  78 cm/s  Celiac Artery Origin:  110 cm/s  SMA Origin:  202 cm/s                                    RIGHT               LEFT  Renal Artery Origin:             Not visualized      Not visualized  Renal Artery Proximal:           177 cm/s            54 cm/s  Renal Artery Mid:                127 cm/s            64 cm/s  Renal Artery Distal:             65 cm/s             66 cm/s  Hilar Acceleration Time (AT):  Renal-Aortic Ratio (RAR):        1.56                0.49  Kidney Size:                     11.1 cm             11.5 cm  End Diastolic Ratio (EDR):  Resistive Index (RI):            0.66                0.67   IMPRESSION:  1. Patent bilateral renal arteries with no significant increase in      Doppler velocities noted, based on limited visualization.  2. The bilateral resistive indices and kidney length measurements are      within normal limits.  3. Limited visualization noted due to overlying bowel gas patterns.       ___________________________________________  Janetta Hora Fields, MD   CH/MEDQ  D:  01/19/2008  T:  01/19/2008  Job:  [id number]

## 2010-07-30 NOTE — Assessment & Plan Note (Signed)
OFFICE VISIT   Ashley Long, Ashley Long  DOB:  1960-08-09                                       01/19/2008  ZOXWR#:[id number]   The patient is a 50 year old female referred by Dr. Noel Gerold for evaluation  for possible renal artery stenosis.  The patient is currently on 5  medications for blood pressure control.  She states that usually her  blood pressure runs in the 130s systolic, but occasionally has spikes.  She has no history of renal dysfunction.  She does have a history of  diabetes.  She has had hypertension for approximate 20 years.  She  previously underwent renal arteriography by Dr. Geralynn Rile in 2006  which showed no significant renal artery stenosis.   PAST SURGICAL HISTORY:  Bilateral tubal ligation in the past.   PAST MEDICAL HISTORY:  Otherwise unremarkable.   MEDICATIONS:  Labetalol 200 mg 2 tablets twice a day, metformin 1000 mg  twice daily, hydrochlorothiazide 25 mg once a day, benazepril 40 mg once  a day, clonidine 0.3 mg twice a day, hydralazine 25 mg every 8 hours,  Lipitor 20 mg once a day, Micardis 80 mg once a day, Byetta pen 10 mg  twice a day, Lantus 40 units every evening, Centrum once daily, aspirin  81 mg once a day.   She has no known drug allergies.   FAMILY HISTORY:  Mother had diabetes and hypertension.   SOCIAL HISTORY:  She is single.  She denies history smoking or alcohol  use.   REVIEW OF SYSTEMS:  She is 5 feet 8 inches, 235 pounds.  CARDIAC:  She has a history of murmur.  PULMONARY/GI/RENAL/VASCULAR/NEUROLOGIC/ORTHOPEDIC/PSYCHIATRIC/ENT/HEMATO  LOGIC REVIEW OF SYSTEMS:  Are otherwise negative.   PHYSICAL EXAM:  Blood pressure is 159/101 in the left arm, pulse is 87  and regular.  HEENT:  Unremarkable.  Neck:  Has 2+ carotid pulses  without a bruit.  Chest:  Clear to auscultation.  Cardiac Exam:  Regular  rate and rhythm without murmur.  Abdomen:  Soft, obese, nontender,  nondistended.  No masses.  Extremities:  She  has 2+ radial, femoral,  dorsalis pedis pulses bilaterally.  She has no significant lower  extremity edema.   She had a renal artery duplex exam today which showed both renal  arteries were patent.  She had no increased velocities in the left or  right renal artery.  The resistive index and kidney size bilaterally  were normal.   In summary, the patient had a normal renal artery duplex exam today.  She also had a renal arteriogram 3 years ago which showed no significant  renal artery stenosis.  I believe, in light of fact that she has a  normal duplex exam today and a previous arteriogram 3 years ago which  showed no significant stenosis, that most likely renal artery stenosis  is not the cause of her hypertension.  Unfortunately, I think the  treatment is going to be medical therapy alone.  Hopefully, with Dr.  Wells Guiles assistance, she will be able to maintain better control of her  blood pressure long-term.  She will follow up on an as-needed basis.   Ashley Long. Fields, MD  Electronically Signed   CEF/MEDQ  D:  01/20/2008  T:  01/20/2008  Job:  [id number]   cc:   Valetta Close, M.D.

## 2010-08-02 NOTE — Op Note (Signed)
Ashley Long, Ashley Long              ACCOUNT NO.:  [id number]   MEDICAL RECORD NO.:  [id number]          PATIENT TYPE:  AMB   LOCATION:  SDS                          FACILITY:  [facility]   PHYSICIAN:  Salvadore Farber, M.D. LHCDATE OF BIRTH:  08/15/1960   DATE OF PROCEDURE:  01/17/2005  DATE OF DISCHARGE:                                 OPERATIVE REPORT   PROCEDURE:  Selective bilateral renal angiography.   INDICATIONS FOR PROCEDURE:  Ashley Long is a 50 year old woman with 20 years  of hypertension which has been difficult to control.  This lead to an MRA  interpreted by Dr. Deanne Coffer to suggest moderate bilateral renal artery  stenosis.  She presents today for renal angiography to exclude severe renal  artery stenosis as a contributor to her hypertension.   PROCEDURE TECHNIQUE:  Informed consent was obtained.  Under 1% lidocaine  local anesthesia, a 5 French sheath was placed in the right common femoral  artery using the modified Seldinger technique.  A pigtail catheter was  advanced to the suprarenal abdominal aorta.  Abdominal aortography performed  by power injection.  This revealed two left renal arteries and a single  right renal artery.  A JR4 catheter was used to selectively cannulate each  of these.  Angiography was performed in turn by hand injection.  The patient  tolerated the procedure well and was transferred to the holding room in  stable condition.  The sheaths were removed there.   COMPLICATIONS:  None.   FINDINGS:  1.  Right renal artery:  Single vessel.  It is angiographically normal.  The      renal parenchyma appears normal.  2.  Left renal artery:  Two vessels.  The superior is dominant.  Both are      angiographically normal.  The renal parenchyma appears normal.   IMPRESSION/RECOMMENDATIONS:  Angiographically normal renal arteries.  This  is not the cause of her hypertension.  The patient is to follow up with Dr.  Renae Fickle for further medical therapy.      Salvadore Farber, M.D. [facility]  Electronically Signed     WED/MEDQ  D:  01/17/2005  T:  01/17/2005  Job:  [id number]   cc:   Ellie Lunch, M.D.  Fax: [phone]

## 2010-09-23 ENCOUNTER — Telehealth: Payer: Self-pay | Admitting: *Deleted

## 2010-09-23 DIAGNOSIS — E119 Type 2 diabetes mellitus without complications: Secondary | ICD-10-CM

## 2010-09-23 NOTE — Telephone Encounter (Signed)
Call from pt needs a referral for the Opthalmologogy.  Has an appointment scheduled for 09/23/2010 with Dr. Dione Booze.

## 2010-10-15 ENCOUNTER — Encounter: Payer: Self-pay | Admitting: Internal Medicine

## 2010-10-15 ENCOUNTER — Ambulatory Visit (INDEPENDENT_AMBULATORY_CARE_PROVIDER_SITE_OTHER): Payer: Self-pay | Admitting: Internal Medicine

## 2010-10-15 VITALS — BP 130/80 | HR 80 | Temp 98.2°F | Ht 68.0 in | Wt 259.3 lb

## 2010-10-15 DIAGNOSIS — E119 Type 2 diabetes mellitus without complications: Secondary | ICD-10-CM

## 2010-10-15 DIAGNOSIS — K921 Melena: Secondary | ICD-10-CM

## 2010-10-15 DIAGNOSIS — I10 Essential (primary) hypertension: Secondary | ICD-10-CM

## 2010-10-15 DIAGNOSIS — E669 Obesity, unspecified: Secondary | ICD-10-CM

## 2010-10-15 DIAGNOSIS — E785 Hyperlipidemia, unspecified: Secondary | ICD-10-CM

## 2010-10-15 LAB — CBC WITH DIFFERENTIAL/PLATELET
Basophils Absolute: 0 10*3/uL (ref 0.0–0.1)
Basophils Relative: 1 % (ref 0–1)
Eosinophils Absolute: 0.3 10*3/uL (ref 0.0–0.7)
Eosinophils Relative: 4 % (ref 0–5)
HCT: 39.8 % (ref 36.0–46.0)
Lymphocytes Relative: 27 % (ref 12–46)
MCHC: 32.9 g/dL (ref 30.0–36.0)
MCV: 88.6 fL (ref 78.0–100.0)
Monocytes Absolute: 0.5 10*3/uL (ref 0.1–1.0)
Platelets: 247 10*3/uL (ref 150–400)
RDW: 13.4 % (ref 11.5–15.5)
WBC: 7.9 10*3/uL (ref 4.0–10.5)

## 2010-10-15 LAB — COMPREHENSIVE METABOLIC PANEL
ALT: 14 U/L (ref 0–35)
CO2: 21 mEq/L (ref 19–32)
Calcium: 9.5 mg/dL (ref 8.4–10.5)
Chloride: 102 mEq/L (ref 96–112)
Glucose, Bld: 199 mg/dL — ABNORMAL HIGH (ref 70–99)
Sodium: 138 mEq/L (ref 135–145)
Total Bilirubin: 0.5 mg/dL (ref 0.3–1.2)
Total Protein: 6.8 g/dL (ref 6.0–8.3)

## 2010-10-15 LAB — TSH: TSH: 1.157 u[IU]/mL (ref 0.350–4.500)

## 2010-10-15 LAB — GLUCOSE, CAPILLARY: Glucose-Capillary: 206 mg/dL — ABNORMAL HIGH (ref 70–99)

## 2010-10-15 LAB — LIPID PANEL
HDL: 44 mg/dL (ref >39)
LDL Cholesterol: 57 mg/dL (ref 0–99)
Triglycerides: 113 mg/dL (ref <150)

## 2010-10-15 LAB — POCT GLYCOSYLATED HEMOGLOBIN (HGB A1C): Hemoglobin A1C: 8.4

## 2010-10-15 NOTE — Progress Notes (Signed)
  Subjective:    Patient ID: Ashley Long, female    DOB: 1960/12/26, 50 y.o.   MRN: [id number]  HPI Patient is a 50 year old female with past medical history as noted in the chart. This is a followup visit for diabetes and hypertension. Patient such A1c is 8.4 today it was 8.5 the last office visit. Patient is brought her meter today and the clicking and the readings it seems like the patient's daytime CBGs have been elevated. We discussed about splitting the morning meals and handle and taking it 2-3 hours apart. Patient has lost about 5 pounds since last office visit. Blood pressure is at goal. Patient is due for lipid panel. She did not notice any blood in her stools recently. No other complaints today.  Review of Systems  Constitutional: Negative for fever, activity change and appetite change.  HENT: Negative for sore throat.   Respiratory: Negative for cough and shortness of breath.   Cardiovascular: Negative for chest pain and leg swelling.  Gastrointestinal: Negative for nausea, abdominal pain, diarrhea, constipation and abdominal distention.  Genitourinary: Negative for frequency, hematuria and difficulty urinating.  Neurological: Negative for dizziness and headaches.  Psychiatric/Behavioral: Negative for suicidal ideas and behavioral problems.       Objective:   Physical Exam  Constitutional: She is oriented to person, place, and time. She appears well-developed and well-nourished.  HENT:  Head: Normocephalic and atraumatic.  Eyes: Conjunctivae and EOM are normal. Pupils are equal, round, and reactive to light. No scleral icterus.  Neck: Normal range of motion. Neck supple. No JVD present. No thyromegaly present.  Cardiovascular: Normal rate, regular rhythm, normal heart sounds and intact distal pulses.  Exam reveals no gallop and no friction rub.   No murmur heard. Pulmonary/Chest: Effort normal and breath sounds normal. No respiratory distress. She has no wheezes. She has no  rales.  Abdominal: Soft. Bowel sounds are normal. She exhibits no distension and no mass. There is no tenderness. There is no rebound and no guarding.  Musculoskeletal: Normal range of motion. She exhibits no edema and no tenderness.  Lymphadenopathy:    She has no cervical adenopathy.  Neurological: She is alert and oriented to person, place, and time.  Psychiatric: She has a normal mood and affect. Her behavior is normal.          Assessment & Plan:   No problem-specific assessment & plan notes found for this encounter.

## 2010-10-15 NOTE — Assessment & Plan Note (Signed)
Will check hemoglobin today though patient denies any recent episodes of blood in stools.

## 2010-10-15 NOTE — Assessment & Plan Note (Signed)
I will check a lipid panel today. Patient currently takes 20 mg of Lipitor.

## 2010-10-15 NOTE — Assessment & Plan Note (Signed)
Patient is well controlled at this time with HBa1c about the same what it was last year. With 30 units of insulin in the morning and 20 at night. I have asked her to keep working on her weight loss patient was 232 pounds in 2010 and the rest A1c was in the 7 range at that time. I showed the chart to her and motivated her to get back to that level of weight. Since her daytime CBGs are elevated I have asked her to split her meals and eat less more often rather than skip meals. Patient voices understanding with the plan. I would like to follow her up in 3 months and we have set a target weight loss of about 15 pounds by the time.

## 2010-10-15 NOTE — Progress Notes (Signed)
Addended by: Remus Blake on: 10/15/2010 11:51 AM   Modules accepted: Orders

## 2010-10-15 NOTE — Patient Instructions (Signed)
1500 Calorie Diabetic Diet The 1500 calorie diabetic diet limits calories to 1500 each day. Following this diet and making healthy meal choices can help improve overall health. It controls blood sugar levels and can also help lower blood pressure and cholesterol.  SERVING SIZES: Measuring foods and serving sizes helps to make sure you are getting the right amount of food. The list below tells how big or small some common serving sizes are.   1 ounce (oz) . . . . . . . . . . . . . . . . 4 stacked dice  3 oz . . . . . . . . . . . . . . . . . . . . . . Deck of cards   1 teaspoon (tsp) . . . . . . . . . . . . . Tip of little finger   1 tablespoon (Tbsp) . . . . . . . . . . Thumb   2 Tbsp . . . . . . . . . . . . . . . . . . . . . Golf ball   1/2 cup . . . . . . . . . . . . . . . . . . . . Half of a fist   1 cup . . . . . . . . . . . . . . . . . . . . . A fist   GUIDELINES FOR CHOOSING FOODS: The goal of this diet is to eat a variety of foods and limit calories to 1500 each day. This can be done by choosing foods that are low in calories and fat. The diet also suggests eating small amounts of food frequently. Doing this helps control your blood sugar levels, so they do not get too high or too low. Each meal or snack may include a protein food source to help you feel more satisfied. Try to eat about the same amount of food around the same time each day. This includes weekend days, travel days, and days off work. Space your meals about 4-5 hours apart, and add a snack between them, if you wish.  For example, a daily food plan could include breakfast, a morning snack, lunch, dinner, and an evening snack. Healthy meals and snacks have different types of foods, including whole grains, vegetables, fruits, lean meats, poultry, fish, and dairy products. As you plan your meals, select a variety of foods. Choose from the bread and starch, vegetable, fruit, dairy, and meat/protein groups. Examples of foods from  each group are listed below, with their suggested serving sizes. Use measuring cups and spoons to become familiar with what a healthy portion looks like. Bread and Starch  Each serving equals 15 grams of carbohydrate 1 slice bread  1/4 bagel  3/4 cup cold cereal (unsweetened)  1/2 cup hot cereal or mashed potatoes  1 small potato (size of a computer mouse) 1/3 cup cooked pasta or rice 1/2 English muffin 1 cup broth based soup 3 cups of popcorn 4-6 whole wheat crackers 1/2 cup cooked beans, peas, or corn Vegetables Each serving equals 5 grams of carbohydrate 1/2 cup cooked vegetables 1 cup raw vegetables 1/2 cup tomato or vegetable juice Fruit  Each serving equals 15 grams of carbohydrate  1 small apple or orange  1-1/4 cup watermelon or strawberries  1/2 cup applesauce (no sugar added)  2 Tbsp raisins  1/2 banana 1/2 cup canned fruit, packed in water or in its own   juice 1/2 cup unsweetened fruit juice  Dairy Each serving equals 12-15 grams of carbohydrate  1 cup fat free milk 6 oz artificially sweetened yogurt or plain yogurt 1 cup low fat buttermilk 1 cup soy milk 1 cup almond milk Meat/Protein 1 large egg 2-3 oz meat, poultry, or fish 1/4 cup low fat cottage cheese 1 Tbsp peanut butter 1 oz low fat cheese 1/4 cup tuna, packed in water 1/2 cup tofu Fat 1 tsp oil 1 tsp trans fat free margarine 1 tsp butter 1 tsp mayonnaise 2 Tbsp avocado 1 Tbsp salad dressing 1 Tbsp cream cheese 2 Tbsp sour cream SAMPLE 1500 CALORIE DIET PLAN: Breakfast:  1/2 whole wheat English muffin (1 carb choice)   1 tsp trans fat free margarine   1 scrambled egg   1 cup fat free milk (1 carb choice)   1 small orange (1 carb choice)  Lunch:  Chicken wrap   1 whole wheat tortilla, 8-inch (1-1/2 carb choices)   2 oz chicken breast, sliced   2 Tbsp low fat salad dressing, such as Italian   1/4 cup shredded lettuce   2 slices tomato   1/2 cup carrot sticks   1 small  apple (1 carb choice)  Afternoon Snack:  3 graham cracker squares (1 carb choice)   1 Tbsp peanut butter  Dinner:  2 oz lean pork chop, broiled   1 cup brown rice (3 carb choices)   1/2 cup steamed carrots   1/2 cup green beans   1 cup fat free milk (1 carb choice)   1 tsp trans fat free margarine  Evening Snack:  1/2 cup low fat cottage cheese   1 small peach or pear, sliced (or 1/2 cup canned in water) (1 carb choice)  Meal Plan You can use this worksheet to help you make a daily meal plan based on the 1500 calorie diabetic diet suggestions. If you are using this plan to help you control your blood glucose, you may interchange carbohydrate containing foods (dairy, starches, and fruits). Select a variety of fresh foods of varying colors and flavors. The total amount of carbohydrate in your meals or snacks is more important than making sure you include all of the food groups every time you eat. You can choose from approximately this many of the following foods to build your day's meals:  6 Starches.  3 Vegetables.   2 Fruits.  2 Dairy.   4-6 oz Meat/Protein.  Up to 3 Fats.   Your dietician can use this worksheet to help you decide how many servings and which types of foods are right for you. Breakfast Food Group and Servings Food Choice Starch _________________________________________________________ Dairy __________________________________________________________ Fruit ___________________________________________________________ Meat/Protein_____________________________________________________ Fat ____________________________________________________________ Lunch Food Group and Servings Food Choice  Starch _________________________________________________________ Meat/Protein ____________________________________________________ Vegetables ______________________________________________________ Fruit ___________________________________________________________ Dairy  __________________________________________________________ Fat ____________________________________________________________ Afternoon Snack Food Group and Servings Food Choice Dairy __________________________________________________________ Starch __________________________________________________________ Meat/Protein_____________________________________________________ Fruit ___________________________________________________________ Dinner Food Group and Servings Food Choice Starch _________________________________________________________ Meat/Protein ____________________________________________________ Dairy __________________________________________________________ Vegetable ______________________________________________________ Fruit ___________________________________________________________ Fat ____________________________________________________________ Evening Snack Food Group and Servings Food Choice Fruit ___________________________________________________________ Meat/Protein ____________________________________________________ Dairy __________________________________________________________ Starch __________________________________________________________ Daily Totals Starches _________________________ Vegetables _________________________ Fruits _____________________________ Dairy _____________________________ Meat/Protein________________________ Fats _______________________________  Document Released: 09/23/2004 Document Re-Released: 08/21/2009 [employer] Patient Information 2011 [employer]. 

## 2010-10-15 NOTE — Assessment & Plan Note (Signed)
Please see the assessment and plan note with the diabetes section.

## 2010-10-15 NOTE — Assessment & Plan Note (Signed)
Well controlled with blood pressure 130/80 today. I would check creatinine and potassium today.

## 2010-10-16 ENCOUNTER — Other Ambulatory Visit: Payer: Self-pay | Admitting: *Deleted

## 2010-10-17 ENCOUNTER — Other Ambulatory Visit: Payer: Self-pay | Admitting: Dietician

## 2010-10-17 DIAGNOSIS — E119 Type 2 diabetes mellitus without complications: Secondary | ICD-10-CM

## 2010-10-17 NOTE — Telephone Encounter (Signed)
Patient requested less expensive glucose testing meter and supplies. Suggested wavesense presto meter and strips from [employer]. Will phone in if you agree.

## 2010-10-18 MED ORDER — AMLODIPINE BESYLATE 10 MG PO TABS: 10.0000 mg | ORAL_TABLET | Freq: Every day | ORAL | Status: DC

## 2010-10-18 MED ORDER — LANCETS 30G MISC: 1.0000 | Freq: Two times a day (BID) | Status: AC

## 2010-10-18 MED ORDER — GLUCOSE BLOOD VI STRP: ORAL_STRIP | Status: DC

## 2010-10-18 MED ORDER — WAVESENSE PRESTO W/DEVICE KIT: 1.0000 | PACK | Freq: Two times a day (BID) | Status: AC

## 2010-10-18 MED ORDER — METFORMIN HCL 1000 MG PO TABS: 1000.0000 mg | ORAL_TABLET | Freq: Two times a day (BID) | ORAL | Status: DC

## 2010-10-18 MED ORDER — ATORVASTATIN CALCIUM 20 MG PO TABS: 20.0000 mg | ORAL_TABLET | Freq: Every day | ORAL | Status: DC

## 2010-10-21 NOTE — Telephone Encounter (Signed)
Lipitor Rx called in

## 2010-10-24 ENCOUNTER — Telehealth: Payer: Self-pay | Admitting: Dietician

## 2010-10-24 DIAGNOSIS — E119 Type 2 diabetes mellitus without complications: Secondary | ICD-10-CM

## 2010-10-24 NOTE — Telephone Encounter (Signed)
Called rxs for meter and supplies from 10/18/10 office visit  to [facility] department. Notified patient of same.

## 2010-11-27 ENCOUNTER — Other Ambulatory Visit: Payer: Self-pay | Admitting: *Deleted

## 2010-11-27 MED ORDER — LABETALOL HCL 200 MG PO TABS: 400.0000 mg | ORAL_TABLET | Freq: Two times a day (BID) | ORAL | Status: DC

## 2010-11-27 MED ORDER — CLONIDINE HCL 0.3 MG PO TABS: 0.3000 mg | ORAL_TABLET | Freq: Two times a day (BID) | ORAL | Status: DC

## 2010-11-27 NOTE — Telephone Encounter (Signed)
Last OV/labs was 10/15/10.

## 2010-12-17 ENCOUNTER — Encounter: Payer: Self-pay | Admitting: Internal Medicine

## 2010-12-17 ENCOUNTER — Ambulatory Visit (INDEPENDENT_AMBULATORY_CARE_PROVIDER_SITE_OTHER): Payer: Self-pay | Admitting: Internal Medicine

## 2010-12-17 VITALS — BP 140/80 | HR 80 | Temp 98.0°F | Ht 68.0 in | Wt 256.5 lb

## 2010-12-17 DIAGNOSIS — E119 Type 2 diabetes mellitus without complications: Secondary | ICD-10-CM

## 2010-12-17 DIAGNOSIS — I10 Essential (primary) hypertension: Secondary | ICD-10-CM

## 2010-12-17 DIAGNOSIS — Z23 Encounter for immunization: Secondary | ICD-10-CM

## 2010-12-17 DIAGNOSIS — E785 Hyperlipidemia, unspecified: Secondary | ICD-10-CM

## 2010-12-17 DIAGNOSIS — N63 Unspecified lump in unspecified breast: Secondary | ICD-10-CM

## 2010-12-17 MED ORDER — INSULIN NPH ISOPHANE & REGULAR (70-30) 100 UNIT/ML ~~LOC~~ SUSP: SUBCUTANEOUS | Status: DC

## 2010-12-17 MED ORDER — GLIPIZIDE 10 MG PO TABS: 10.0000 mg | ORAL_TABLET | Freq: Two times a day (BID) | ORAL | Status: DC

## 2010-12-17 NOTE — Assessment & Plan Note (Signed)
Call her back in one month for repeat blood pressure check up.Encouraged Her to lose weight.

## 2010-12-17 NOTE — Assessment & Plan Note (Signed)
LDL level is at goal

## 2010-12-17 NOTE — Progress Notes (Signed)
  Subjective:    Patient ID: Ashley Long, female    DOB: [birth year], 50 y.o.   MRN: [id number]  HPI  Patient is a 50 year old female with past medical history of diabetes, hypertension, hyperlipidemia and morbid obesity who comes in today for regular followup and also with complaint of lump in her right breast.  Patient states that she usually self examines herself before her menses. A few days ago when she was doing the same she she felt that that is the forearm lump on the medial side of her right breast. There is no discharge, tenderness or redness noted. No other complaints at this time.  Her blood pressure is slightly elevated.  Patient has lost about 5 pounds and I congratulated her on this achievement.  Her HbA1c is 8.5 today it was about 8.4 the last time.  Patient is due for a flu shot.  Review of Systems  Constitutional: Negative for fever, activity change and appetite change.       Breast lump on the right side  HENT: Negative for sore throat.   Respiratory: Negative for cough and shortness of breath.   Cardiovascular: Negative for chest pain and leg swelling.  Gastrointestinal: Negative for nausea, abdominal pain, diarrhea, constipation and abdominal distention.  Genitourinary: Negative for frequency, hematuria and difficulty urinating.  Neurological: Negative for dizziness and headaches.  Psychiatric/Behavioral: Negative for suicidal ideas and behavioral problems.       Objective:   Physical Exam  Constitutional: She is oriented to person, place, and time. She appears well-developed and well-nourished.  HENT:  Head: Normocephalic and atraumatic.  Eyes: Conjunctivae and EOM are normal. Pupils are equal, round, and reactive to light. No scleral icterus.  Neck: Normal range of motion. Neck supple. No JVD present. No thyromegaly present.  Cardiovascular: Normal rate, regular rhythm, normal heart sounds and intact distal pulses.  Exam reveals no gallop and no friction  rub.   No murmur heard. Pulmonary/Chest: Effort normal and breath sounds normal. No respiratory distress. She has no wheezes. She has no rales. Right breast exhibits mass.    Abdominal: Soft. Bowel sounds are normal. She exhibits no distension and no mass. There is no tenderness. There is no rebound and no guarding.  Musculoskeletal: Normal range of motion. She exhibits no edema and no tenderness.  Lymphadenopathy:    She has no cervical adenopathy.  Neurological: She is alert and oriented to person, place, and time.  Psychiatric: She has a normal mood and affect. Her behavior is normal.          Assessment & Plan:

## 2010-12-17 NOTE — Assessment & Plan Note (Signed)
We discussed in detail the self management off insulin. Patient was given instructions to increase her night dose of insulin if her morning CBG is more than 180. Likewise if her morning CBG is less than 70 she was asked to cut down her insulin dose at night by 10 points. I would increase her insulin by 5 points both at night and daytime based on her meter reading. I will also add glipizide 10 mg twice daily to her current regimen. Patient has lost about 5 pounds in the last are 2 keep looking on her weight loss. We'll call her back in one month with her meter readings.

## 2010-12-17 NOTE — Patient Instructions (Signed)
1800 Calorie Diabetic Diet The 1800 calorie diabetic diet is designed for eating up to 1800 calories each day. Following this diet and making healthy meal choices can help improve overall health. It controls blood sugar levels, and it can also help lower blood pressure and cholesterol. SERVING SIZES Measuring foods and serving sizes helps to make sure you are getting the right amount of food. The list below tells how big or small some common serving sizes are:  1 ounce (oz)...................................4 stacked dice.   3 oz................................................Deck of cards.   1 teaspoon (tsp).............................Tip of little finger.   1 tablespoon (Tbsp).......................Thumb.   2 Tbsp...........................................Golf ball.   1/2 cup..........................................Half of a fist.   1 cup.............................................A fist.  GUIDELINES FOR CHOOSING FOODS The goal of this diet is to eat a variety of foods and limit calories to 1800 each day. This can be done by choosing foods that are low in calories and fat. The diet also suggests eating small amounts of food frequently. Doing this helps control your blood sugar levels so they do not get too high or too low. Each meal or snack may include a protein food source to help you feel more satisfied. Try to eat about the same amount of food around the same time each day. This includes weekend days, travel days, and days off work. Space your meals about 4 to 5 hours apart, and add a snack between them, if you wish.  For example, a daily food plan could include breakfast, a morning snack, lunch, dinner, and an evening snack. Healthy meals and snacks have different types of foods, including whole grains, vegetables, fruits, lean meats, poultry, fish, and dairy products. As you plan your meals, select a variety of foods. Choose from the bread and starch, vegetable, fruit, dairy, and  meat/protein groups. Examples of foods from each group are listed below with their suggested serving sizes. Use measuring cups and spoons to become familiar with what a healthy portion looks like. Bread and Starch Each serving equals 15 grams of carbohydrates. 1 slice bread. 1/4 bagel. 3/4 cup cold cereal (unsweetened). 1/2 cup hot cereal or mashed potatoes. 1 small potato (size of a computer mouse). 1/3 cup cooked pasta or rice. 1/2 English muffin. 1 cup broth-based soup. 3 cups of popcorn. 4 to 6 whole-wheat crackers. 1/2 cup cooked beans, peas, or corn. Vegetable Each serving equals 5 grams of carbohydrates. 1/2 cup cooked vegetables. 1 cup raw vegetables. 1/2 cup tomato or vegetable juice. Fruit Each serving equals 15 grams of carbohydrates. 1 small apple or orange. 1-1/4 cup watermelon or strawberries . 1/2 cup applesauce (no sugar added). 2 Tbsp raisins. 1/2 banana. 1/2 cup canned fruit, packed in water or in its own juice. 1/2 cup unsweetened fruit juice. Dairy Each serving equals 12 to 15 grams of carbohydrates. 1 cup fat-free milk. 6 oz artificially sweetened yogurt or plain yogurt. 1 cup low-fat buttermilk. 1 cup soy milk. 1 cup almond milk. Meat/Protein 1 large egg. 2 to 3 oz meat, poultry, or fish. 1/4 cup low-fat cottage cheese. 1 Tbsp peanut butter. 1 oz low-fat cheese. 1/4 cup tuna, packed in water. 1/2 cup tofu. Fat 1 tsp oil. 1 tsp trans-fat-free margarine. 1 tsp butter. 1 tsp mayonnaise. 2 Tbsp avocado. 1 Tbsp salad dressing. 1 Tbsp cream cheese. 2 Tbsp sour cream. SAMPLE 1800 CALORIE DIET PLAN Breakfast:  3/4 cup unsweetened cereal (1 carb choice).   1 cup fat-free milk (1 carb choice).   1 slice whole-wheat toast (1 carb choice).     1/2 small banana (1 carb choice).   1 scrambled egg.   1 tsp trans-fat-free margarine.  Lunch:  Tuna sandwich.   2 slices whole-wheat bread (2 carb choices).   1/2 cup canned tuna in water,  drained.   1 Tbsp reduced fat mayonnaise.   1 stalk celery, chopped.   2 slices tomato.   1 lettuce leaf.   1 cup carrot sticks.   24 to 30 seedless grapes (2 carb choices).   6 oz light yogurt (1 carb choice).  Afternoon Snack:  3 graham cracker squares (1 carb choice).   1 cup fat-free milk (1 carb choice).   1 Tbsp peanut butter.  Dinner:  3 oz salmon, broiled with 1 tsp oil.   1 cup mashed potatoes (2 carb choices) with 1 tsp trans-fat-free margarine.   1 cup fresh or frozen green beans.   1 cup steamed asparagus.   1 cup fat-free milk (1 carb choice).  Evening Snack:  3 cups of air-popped popcorn (1 carb choice).   2 Tbsp Parmesan cheese.  Meal Plan You can use this worksheet to help you make a daily meal plan based on the 1800 calorie diabetic diet suggestions. If you are using this plan to help you control your blood glucose, you may interchange carbohydrate-containing foods (dairy, starches, and fruits). Select a variety of fresh foods of varying colors and flavors. The total amount of carbohydrate in your meals or snacks is more important than making sure you include all of the food groups every time you eat. Choose from the approximate amount of the following foods to build your day's meals:  8 Starches.  4 Vegetables.   3 Fruits.  2 Dairy.   6 to 7 oz Meat/Protein.  Up to 4 Fats.   Your dietician can use this worksheet to help you decide how many servings and which types of foods are right for you. Breakfast Food Group and Servings Food Choice Starches ________________________________________________________ Dairy __________________________________________________________ Fruit ___________________________________________________________ Meat/Protein_____________________________________________________ Fat ____________________________________________________________ Lunch Food Group and Servings Food Choice Starch  _________________________________________________________ Meat/Protein ____________________________________________________ Vegetables ______________________________________________________ Fruit ___________________________________________________________ Dairy __________________________________________________________ Fat ____________________________________________________________ Afternoon Snack Food Group and Servings Food Choice Starch __________________________________________________________ Meat/Protein_____________________________________________________ Fruit ___________________________________________________________ Dairy __________________________________________________________ Dinner Food Group and Servings Food Choice Starches ________________________________________________________ Meat/Protein ____________________________________________________ Dairy __________________________________________________________ Vegetable ______________________________________________________ Fruit ___________________________________________________________ Fat ____________________________________________________________ Evening Snack Food Group and Servings Food Choice Fruit ___________________________________________________________ Meat/Protein ____________________________________________________ Dairy __________________________________________________________ Starch __________________________________________________________ Daily Totals Starches _________________________ Vegetables _________________________ Fruits _____________________________ Dairy _____________________________ Meat/Protein________________________ Fats _______________________________  Document Released: 09/23/2004 Document Re-Released: 08/21/2009 [employer] Patient Information 2011 [employer]. 

## 2010-12-30 ENCOUNTER — Telehealth: Payer: Self-pay | Admitting: Dietician

## 2010-12-30 DIAGNOSIS — E119 Type 2 diabetes mellitus without complications: Secondary | ICD-10-CM

## 2010-12-30 NOTE — Progress Notes (Signed)
Addended by: Maura Crandall on: 12/30/2010 08:44 AM   Modules accepted: Orders

## 2010-12-30 NOTE — Telephone Encounter (Signed)
Given that Ashley Long's sugars were high and Hba1c were way out of range, her insulin was increased and glucotrol was added. I agree with the above changes. If patient has early mornuing CBG below 70 then her insulin should be decreased by 5 points. No change if CBG between 70-130.

## 2010-12-30 NOTE — Telephone Encounter (Addendum)
Has been having lows that are scaring her since last visit here and insulin doses increased from 30 units before breakfast and  20 units before dinner to 35 and 25 units respectively and glucotrol pill 10 mg twice a day added. This weekend had a blood sugar of 47 @ 245 am, 63 that evening, went back to 30 units and 20 units.  Has been eating more at meals and snacks, and still has lows, checking sugar more often, last night 101 at 3 AM- ate peanut butter crackers and drank juice and was 124 fasting this am. Asks that strips amounts be  increased.   Told her to decrease Pm dose to 18 units tonight unless she hears back from [place] to do differerently. Will  discuss with Dr. Eben Burow as he has clinic today. Will call in strips to [facility] pharmacy if approved.

## 2011-01-01 MED ORDER — GLUCOSE BLOOD VI STRP: ORAL_STRIP | Status: DC

## 2011-01-01 NOTE — Telephone Encounter (Signed)
Left patient message regarding further instructions per Dr. Eben Burow.

## 2011-01-08 ENCOUNTER — Other Ambulatory Visit: Payer: Self-pay | Admitting: Dietician

## 2011-01-08 ENCOUNTER — Telehealth: Payer: Self-pay | Admitting: Dietician

## 2011-01-08 DIAGNOSIS — E119 Type 2 diabetes mellitus without complications: Secondary | ICD-10-CM

## 2011-01-08 NOTE — Telephone Encounter (Signed)
Patient called about getting more test strips, she wants to increase her self monitoring. Also shared she has not gone down the 5 units, only the 1 unit in PM dose to 19 units and feels this is satisfactory, although She  has had one episode of 64 in the middle of the night since decreasing the Pm dose to 19 units. Encouraged patient to decrease Pm insulin by 1 more unit to 18 units in pm to eliminate nocturnal hypoglycemia and keep her sugar closer to 100-150 during sleep and 30 units in am.

## 2011-01-12 MED ORDER — GLUCOSE BLOOD VI STRP: ORAL_STRIP | Status: DC

## 2011-01-14 ENCOUNTER — Ambulatory Visit
Admission: RE | Admit: 2011-01-14 | Discharge: 2011-01-14 | Disposition: A | Discharge status: Home / Self Care | Payer: No Typology Code available for payment source | Source: Ambulatory Visit | Attending: Internal Medicine | Admitting: Internal Medicine

## 2011-01-14 ENCOUNTER — Ambulatory Visit: Admission: RE | Admit: 2011-01-14 | Payer: Self-pay | Source: Ambulatory Visit

## 2011-01-14 DIAGNOSIS — N63 Unspecified lump in unspecified breast: Secondary | ICD-10-CM

## 2011-01-21 ENCOUNTER — Other Ambulatory Visit: Payer: Self-pay | Admitting: Internal Medicine

## 2011-01-21 ENCOUNTER — Encounter: Payer: Self-pay | Admitting: Internal Medicine

## 2011-01-21 ENCOUNTER — Ambulatory Visit (INDEPENDENT_AMBULATORY_CARE_PROVIDER_SITE_OTHER): Payer: No Typology Code available for payment source | Admitting: Internal Medicine

## 2011-01-21 VITALS — BP 162/98 | HR 80 | Temp 97.4°F | Resp 20 | Ht 68.0 in | Wt 261.0 lb

## 2011-01-21 DIAGNOSIS — I10 Essential (primary) hypertension: Secondary | ICD-10-CM

## 2011-01-21 DIAGNOSIS — E119 Type 2 diabetes mellitus without complications: Secondary | ICD-10-CM

## 2011-01-21 LAB — GLUCOSE, CAPILLARY: Glucose-Capillary: 208 mg/dL — ABNORMAL HIGH (ref 70–99)

## 2011-01-21 MED ORDER — INSULIN NPH ISOPHANE & REGULAR (70-30) 100 UNIT/ML ~~LOC~~ SUSP: SUBCUTANEOUS | Status: DC

## 2011-01-21 NOTE — Progress Notes (Signed)
  Subjective:    Patient ID: Ashley Long, female    DOB: 03/27/1960, 50 y.o.   MRN: [id number]  HPI  Ashley Long is a 50 year old female patient of mine who is here today for a diabetes checkup.  After reviewing her chart it seems like patient's CBGs have been in the 50s and 60s in the early mornings. Also her afternoon and late afternoon CBGs have been 60s and 70s.   The patient is trying to lose weight but has actually gained 5 pounds since last office visit.   Review of Systems  Constitutional: Negative for fever, activity change and appetite change.  HENT: Negative for sore throat.   Respiratory: Negative for cough and shortness of breath.   Cardiovascular: Negative for chest pain and leg swelling.  Gastrointestinal: Negative for nausea, abdominal pain, diarrhea, constipation and abdominal distention.  Genitourinary: Negative for frequency, hematuria and difficulty urinating.  Neurological: Negative for dizziness and headaches.  Psychiatric/Behavioral: Negative for suicidal ideas and behavioral problems.       Objective:   Physical Exam  Constitutional: She is oriented to person, place, and time. She appears well-developed and well-nourished.  HENT:  Head: Normocephalic and atraumatic.  Eyes: Conjunctivae and EOM are normal. Pupils are equal, round, and reactive to light. No scleral icterus.  Neck: Normal range of motion. Neck supple. No JVD present. No thyromegaly present.  Cardiovascular: Normal rate, regular rhythm, normal heart sounds and intact distal pulses.  Exam reveals no gallop and no friction rub.   No murmur heard. Pulmonary/Chest: Effort normal and breath sounds normal. No respiratory distress. She has no wheezes. She has no rales.  Abdominal: Soft. Bowel sounds are normal. She exhibits no distension and no mass. There is no tenderness. There is no rebound and no guarding.  Musculoskeletal: Normal range of motion. She exhibits no edema and no tenderness.    Lymphadenopathy:    She has no cervical adenopathy.  Neurological: She is alert and oriented to person, place, and time.  Psychiatric: She has a normal mood and affect. Her behavior is normal.          Assessment & Plan:

## 2011-01-21 NOTE — Patient Instructions (Signed)
1800 Calorie Diabetic Diet The 1800 calorie diabetic diet is designed for eating up to 1800 calories each day. Following this diet and making healthy meal choices can help improve overall health. It controls blood glucose (sugar) levels, and it can also help lower blood pressure and cholesterol. SERVING SIZES Measuring foods and serving sizes helps to make sure you are getting the right amount of food. The list below tells how big or small some common serving sizes are:  1 oz.........4 stacked dice.   3 oz.........Deck of cards.   1 tsp........Tip of little finger.   1 tbs........Thumb.   2 tbs........Golf ball.    cup.......Half of a fist.   1 cup........A fist.  GUIDELINES FOR CHOOSING FOODS The goal of this diet is to eat a variety of foods and limit calories to 1800 each day. This can be done by choosing foods that are low in calories and fat. The diet also suggests eating small amounts of food frequently. Doing this helps control your blood glucose levels so they do not get too high or too low. Each meal or snack may include a protein food source to help you feel more satisfied. Try to eat about the same amount of food around the same time each day. This includes weekend days, travel days, and days off work. Space your meals about 4 to 5 hours apart, and add a snack between them, if you wish.  For example, a daily food plan could include breakfast, a morning snack, lunch, dinner, and an evening snack. Healthy meals and snacks have different types of foods, including whole grains, vegetables, fruits, lean meats, poultry, fish, and dairy products. As you plan your meals, select a variety of foods. Choose from the bread and starch, vegetable, fruit, dairy, and meat/protein groups. Examples of foods from each group are listed below with their suggested serving sizes. Use measuring cups and spoons to become familiar with what a healthy portion looks like. Bread and Starch Each serving equals  15 grams of carbohydrates.  1 slice bread.    bagel.    cup cold cereal (unsweetened).    cup hot cereal or mashed potatoes.   1 small potato (size of a computer mouse).   ? cup cooked pasta or rice.    English muffin.   1 cup broth-based soup.   3 cups of popcorn.   4 to 6 whole-wheat crackers.    cup cooked beans, peas, or corn.  Vegetables Each serving equals 5 grams of carbohydrates.   cup cooked vegetables.   1 cup raw vegetables.    cup tomato or vegetable juice.  Fruit Each serving equals 15 grams of carbohydrates.  1 small apple or orange.   1  cup watermelon or strawberries.    cup applesauce (no sugar added).   2 tbs raisins.    banana.    cup canned fruit, packed in water or in its own juice.    cup unsweetened fruit juice.  Dairy Each serving equals 12 to 15 grams of carbohydrates.  1 cup fat-free milk.   6 oz artificially sweetened yogurt or plain yogurt.   1 cup low-fat buttermilk.   1 cup soy milk.   1 cup almond milk.  Meat/Protein  1 large egg.   2 to 3 oz meat, poultry, or fish.    cup low-fat cottage cheese.   1 tbs peanut butter.   1 oz low-fat cheese.    cup tuna, packed in water.      cup tofu.  Fat  1 tsp oil.   1 tsp trans-fat-free margarine.   1 tsp butter.   1 tsp mayonnaise.   2 tbs avocado.   1 tbs salad dressing.   1 tbs cream cheese.   2 tbs sour cream.  SAMPLE 1800 CALORIE DIET PLAN Breakfast   cup unsweetened cereal (1 carb serving).   1 cup fat-free milk (1 carb serving).   1 slice whole-wheat toast (1 carb serving).    small banana (1 carb serving).   1 scrambled egg.   1 tsp trans-fat-free margarine.  Lunch  Tuna sandwich.   2 slices whole-wheat bread (2 carb servings).    cup canned tuna in water, drained.   1 tbs reduced fat mayonnaise.   1 stalk celery, chopped.   2 slices tomato.   1 lettuce leaf.   1 cup carrot sticks.   24 to 30 seedless  grapes (2 carb servings).   6 oz light yogurt (1 carb serving).  Afternoon Snack  3 graham cracker squares (1 carb serving).   1 cup fat-free milk (1 carb serving).   1 tbs peanut butter.  Dinner  3 oz salmon, broiled with 1 tsp oil.   1 cup mashed potatoes (2 carb servings) with 1 tsp trans-fat-free margarine.   1 cup fresh or frozen green beans.   1 cup steamed asparagus.   1 cup fat-free milk (1 carb serving).  Evening Snack  3 cups of air-popped popcorn (1 carb serving).   2 tbs Parmesan cheese.  Meal Plan You can use this worksheet to help you make a daily meal plan based on the 1800 calorie diabetic diet suggestions. If you are using this plan to help you control your blood glucose, you may interchange carbohydrate-containing foods (dairy, starches, and fruits). Select a variety of fresh foods of varying colors and flavors. The total amount of carbohydrate in your meals or snacks is more important than making sure you include all of the food groups every time you eat. Choose from the approximate amount of the following foods to build your day's meals:  8 Starches.   4 Vegetables.   3 Fruits.   2 Dairy.   6 to 7 oz Meat/Protein.   Up to 4 Fats.  Your dietician can use this worksheet to help you decide how many servings and which types of foods are right for you. BREAKFAST Food Group and Servings / Food Choice Starches _______________________________________________________ Dairy __________________________________________________________ Fruit ___________________________________________________________ Meat/Protein ____________________________________________________ Fat ____________________________________________________________ LUNCH Food Group and Servings / Food Choice Starch _________________________________________________________ Meat/Protein ___________________________________________________ Vegetables  _____________________________________________________ Fruit __________________________________________________________ Dairy __________________________________________________________ Fat ____________________________________________________________ AFTERNOON SNACK Food Group and Servings / Food Choice Starch ________________________________________________________ Meat/Protein ___________________________________________________ Fruit __________________________________________________________ Dairy __________________________________________________________ DINNER Food Group and Servings / Food Choice Starches _______________________________________________________ Meat/Protein ___________________________________________________ Dairy __________________________________________________________ Vegetable ______________________________________________________ Fruit ___________________________________________________________ Fat ____________________________________________________________ EVENING SNACK Food Group and Servings / Food Choice Fruit __________________________________________________________ Meat/Protein ___________________________________________________ Dairy __________________________________________________________ Starch _________________________________________________________ DAILY TOTALS Starches _________________________ Vegetables _______________________ Fruits ____________________________ Dairy ____________________________ Meat/Protein_____________________ Fats _____________________________ Document Released: 09/23/2004 Document Revised: 11/13/2010 Document Reviewed: 01/18/2009 [employer] Patient Information 2012 [employer]. 

## 2011-01-21 NOTE — Assessment & Plan Note (Signed)
I would go down on patient's insulin dose. She has been taking 30 units in the morning and 17 units at night. I will go down to 26 units in the morning and 12 units at night. Followup in 1-2 months and encouraged her to lose weight.

## 2011-01-21 NOTE — Assessment & Plan Note (Signed)
Patient's BP is relatively on the higher side today. Patient is currently on 3 different BP meds. I will continue to monitor her BP and encouraged her to loose weight.

## 2011-02-14 ENCOUNTER — Ambulatory Visit (INDEPENDENT_AMBULATORY_CARE_PROVIDER_SITE_OTHER): Payer: No Typology Code available for payment source | Admitting: Internal Medicine

## 2011-02-14 DIAGNOSIS — E119 Type 2 diabetes mellitus without complications: Secondary | ICD-10-CM

## 2011-02-14 DIAGNOSIS — K921 Melena: Secondary | ICD-10-CM

## 2011-02-14 MED ORDER — HYDROCORTISONE ACETATE 25 MG RE SUPP: 25.0000 mg | Freq: Two times a day (BID) | RECTAL | Status: AC

## 2011-02-14 MED ORDER — PSYLLIUM 48.57 % PO POWD: ORAL | Status: AC

## 2011-02-14 NOTE — Progress Notes (Signed)
  Subjective:    Patient ID: Ashley Long, female    DOB: 04-16-1960, 50 y.o.   MRN: [id number]  HPI  [name] is 50 year old female patient of mine who is here today for rectal bleeding.  She noticed blood on the tissue paper after cleaning herself after finishing defecation. It was small in amount. She noticed the same this morning as well.    Review of Systems  Constitutional: Negative for fever, activity change and appetite change.  HENT: Negative for sore throat.   Respiratory: Negative for cough and shortness of breath.   Cardiovascular: Negative for chest pain and leg swelling.  Gastrointestinal: Negative for nausea, abdominal pain, diarrhea, constipation and abdominal distention.  Genitourinary: Negative for frequency, hematuria and difficulty urinating.  Neurological: Negative for dizziness and headaches.  Psychiatric/Behavioral: Negative for suicidal ideas and behavioral problems.       Objective:   Physical Exam  Constitutional: She is oriented to person, place, and time. She appears well-developed and well-nourished.  HENT:  Head: Normocephalic and atraumatic.  Eyes: Conjunctivae and EOM are normal. Pupils are equal, round, and reactive to light. No scleral icterus.  Neck: Normal range of motion. Neck supple. No JVD present. No thyromegaly present.  Cardiovascular: Normal rate, regular rhythm, normal heart sounds and intact distal pulses.  Exam reveals no gallop and no friction rub.   No murmur heard. Pulmonary/Chest: Effort normal and breath sounds normal. No respiratory distress. She has no wheezes. She has no rales.  Abdominal: Soft. Bowel sounds are normal. She exhibits no distension and no mass. There is no tenderness. There is no rebound and no guarding.  Musculoskeletal: Normal range of motion. She exhibits no edema and no tenderness.  Lymphadenopathy:    She has no cervical adenopathy.  Neurological: She is alert and oriented to person, place, and time.    Psychiatric: She has a normal mood and affect. Her behavior is normal.          Assessment & Plan:

## 2011-02-14 NOTE — Assessment & Plan Note (Addendum)
Patient has painless bleeding after she is is done with defecation. This is classical for external hemorrhoids. I have asked her to increase her dietary fiber intake, drink a lot of water and fluids, exercise and not to hold her stools when she has an urge to defecate. Prescribed her Anusol if she has pain and the bleeding does not stop in next 2-3 days.

## 2011-02-14 NOTE — Assessment & Plan Note (Signed)
Reviewed. She has lost 1.5 pounds since last office visit.

## 2011-02-14 NOTE — Patient Instructions (Signed)

## 2011-02-26 ENCOUNTER — Other Ambulatory Visit: Payer: Self-pay | Admitting: *Deleted

## 2011-02-27 MED ORDER — LABETALOL HCL 200 MG PO TABS: 400.0000 mg | ORAL_TABLET | Freq: Two times a day (BID) | ORAL | Status: DC

## 2011-04-02 ENCOUNTER — Telehealth: Payer: Self-pay | Admitting: *Deleted

## 2011-04-02 NOTE — Telephone Encounter (Signed)
RTC to pt said that she is seeing dark red blood only in her stools and when she wipes.  Does Have Hemorrhoids.  Said that she does sometimes strain to have the bowel movements.  No stomach pain or other discomforts.  Has had in the past.  Has used the suppositories.  Wants to know if she needs to come in sooner than her scheduled appointment in Feb. 2013.  Said she would come in if needed.  Angelina Ok, RN 04/02/2011 1:42 PM.

## 2011-04-02 NOTE — Telephone Encounter (Signed)
Call to pt message left with pt's daughter that the Clinics had called.  Pt will not need to come for an earlier appointment .  To drink more water and to eat high fiber foods.  Angelina Ok, RN 04/02/2011 4:49 PM.

## 2011-04-02 NOTE — Telephone Encounter (Signed)
As patient was just seen and treated for this in 11/12 and is now having the same thing I do not think she needs to come in sooner. I would again remind her to increase water intake and high fiber diet.

## 2011-04-21 ENCOUNTER — Other Ambulatory Visit: Payer: Self-pay | Admitting: Internal Medicine

## 2011-04-21 ENCOUNTER — Other Ambulatory Visit: Payer: Self-pay | Admitting: *Deleted

## 2011-04-21 DIAGNOSIS — Z1231 Encounter for screening mammogram for malignant neoplasm of breast: Secondary | ICD-10-CM

## 2011-04-21 MED ORDER — AMLODIPINE BESYLATE 10 MG PO TABS: 10.0000 mg | ORAL_TABLET | Freq: Every day | ORAL | Status: DC

## 2011-04-21 MED ORDER — CLONIDINE HCL 0.3 MG PO TABS: 0.3000 mg | ORAL_TABLET | Freq: Two times a day (BID) | ORAL | Status: DC

## 2011-04-21 MED ORDER — QUINAPRIL-HYDROCHLOROTHIAZIDE 20-12.5 MG PO TABS: 2.0000 | ORAL_TABLET | Freq: Every day | ORAL | Status: DC

## 2011-04-21 MED ORDER — GLIPIZIDE 10 MG PO TABS: 10.0000 mg | ORAL_TABLET | Freq: Two times a day (BID) | ORAL | Status: DC

## 2011-04-21 MED ORDER — ATORVASTATIN CALCIUM 20 MG PO TABS: 20.0000 mg | ORAL_TABLET | Freq: Every day | ORAL | Status: DC

## 2011-04-21 MED ORDER — LABETALOL HCL 200 MG PO TABS: 400.0000 mg | ORAL_TABLET | Freq: Two times a day (BID) | ORAL | Status: DC

## 2011-04-21 MED ORDER — METFORMIN HCL 1000 MG PO TABS: 1000.0000 mg | ORAL_TABLET | Freq: Two times a day (BID) | ORAL | Status: DC

## 2011-04-22 NOTE — Telephone Encounter (Signed)
Rx's called in - the 2 that were not sent electronicly

## 2011-04-28 ENCOUNTER — Encounter: Payer: Self-pay | Admitting: Internal Medicine

## 2011-04-28 ENCOUNTER — Ambulatory Visit (INDEPENDENT_AMBULATORY_CARE_PROVIDER_SITE_OTHER): Payer: Self-pay | Admitting: Internal Medicine

## 2011-04-28 VITALS — BP 152/88 | HR 85 | Ht 68.0 in | Wt 254.7 lb

## 2011-04-28 DIAGNOSIS — Z79899 Other long term (current) drug therapy: Secondary | ICD-10-CM

## 2011-04-28 DIAGNOSIS — Z299 Encounter for prophylactic measures, unspecified: Secondary | ICD-10-CM | POA: Insufficient documentation

## 2011-04-28 DIAGNOSIS — Z124 Encounter for screening for malignant neoplasm of cervix: Secondary | ICD-10-CM

## 2011-04-28 DIAGNOSIS — E669 Obesity, unspecified: Secondary | ICD-10-CM

## 2011-04-28 DIAGNOSIS — E119 Type 2 diabetes mellitus without complications: Secondary | ICD-10-CM

## 2011-04-28 DIAGNOSIS — I10 Essential (primary) hypertension: Secondary | ICD-10-CM

## 2011-04-28 DIAGNOSIS — K921 Melena: Secondary | ICD-10-CM

## 2011-04-28 LAB — POCT GLYCOSYLATED HEMOGLOBIN (HGB A1C): Hemoglobin A1C: 8.8

## 2011-04-28 LAB — GLUCOSE, CAPILLARY: Glucose-Capillary: 150 mg/dL — ABNORMAL HIGH (ref 70–99)

## 2011-04-28 MED ORDER — INSULIN NPH ISOPHANE & REGULAR (70-30) 100 UNIT/ML ~~LOC~~ SUSP: SUBCUTANEOUS | Status: DC

## 2011-04-28 NOTE — Patient Instructions (Signed)

## 2011-04-28 NOTE — Assessment & Plan Note (Signed)
Patient is currently taking Metamucil and was advised to use Colace every day if needed. Some of the conservative measures like sitting in a warm bath tub were discussed for acute inflammation.

## 2011-04-28 NOTE — Progress Notes (Signed)
  Subjective:    Patient ID: Ashley Long, female    DOB: 1960-07-13, 51 y.o.   MRN: [id number]  HPI  Ashley Long is a 51 year old female with past medical history most significant for obesity, diabetes and hypertension.  She's my own clinic patient is here today for regular followup.  Patient is due for a Pap smear and wants to get one today.  Her blood pressure is 152/88 in the clinic but on repeating it was 127/80. Patient mentioned that her blood pressure has been in 120 over 70s at home.  On reviewing patient's glucose meter there are some highs in the 200s during the late afternoon and dinner. Patient's morning CBGs have been more in the 150s range.   Patient is up-to-date on all her other health maintenance.  Patient wanted herself to be checked out for hemorrhoids. She has had constipation and uses Metamucil every day.  Review of Systems  Constitutional: Negative for fever, activity change and appetite change.  HENT: Negative for sore throat.   Respiratory: Negative for cough and shortness of breath.   Cardiovascular: Negative for chest pain and leg swelling.  Gastrointestinal: Negative for nausea, abdominal pain, diarrhea, constipation and abdominal distention.  Genitourinary: Negative for frequency, hematuria and difficulty urinating.  Neurological: Negative for dizziness and headaches.  Psychiatric/Behavioral: Negative for suicidal ideas and behavioral problems.       Objective:   Physical Exam  Constitutional: She is oriented to person, place, and time. She appears well-developed and well-nourished.  HENT:  Head: Normocephalic and atraumatic.  Eyes: Conjunctivae and EOM are normal. Pupils are equal, round, and reactive to light. No scleral icterus.  Neck: Normal range of motion. Neck supple. No JVD present. No thyromegaly present.  Cardiovascular: Normal rate, regular rhythm, normal heart sounds and intact distal pulses.  Exam reveals no gallop and no friction rub.    No murmur heard. Pulmonary/Chest: Effort normal and breath sounds normal. No respiratory distress. She has no wheezes. She has no rales.  Abdominal: Soft. Bowel sounds are normal. She exhibits no distension and no mass. There is no tenderness. There is no rebound and no guarding.  Genitourinary: Vagina normal. Guaiac negative stool. No vaginal discharge found.       Patient is pelvic exam was done which did not reveal any tenderness or discharge. Pap smear was done with minimal bleeding post sample.   Rectal exam was suggestive of external hemorrhoids. Hemoccult was negative. Anal sphincter was tense at the time of exam.  Musculoskeletal: Normal range of motion. She exhibits no edema and no tenderness.  Lymphadenopathy:    She has no cervical adenopathy.  Neurological: She is alert and oriented to person, place, and time.  Psychiatric: She has a normal mood and affect. Her behavior is normal.          Assessment & Plan:

## 2011-04-28 NOTE — Assessment & Plan Note (Signed)
Patient's HbA1c has gone up to 8.6. She has lost 5 pounds and I congratulated her on that. We will go up on her insulin by 2 units in the morning and by 4 units at nighttime based on her CBG's.

## 2011-04-28 NOTE — Assessment & Plan Note (Signed)
Discussed weight loss strategies. Congratulated her on losing 5 pounds.

## 2011-04-28 NOTE — Assessment & Plan Note (Signed)
No change in blood pressure medications at this time. Patient's creatinine and potassium are relatively recent and will need to be repeated in 3 months.

## 2011-05-07 ENCOUNTER — Telehealth: Payer: Self-pay | Admitting: Dietician

## 2011-05-07 NOTE — Telephone Encounter (Signed)
Discussed options with patient after discussing with [facility]: patient can purchase 100 strips for 12.00 from them or we think there is a new [facility] meter that takes strips that are < $20.00 for 100 strips.

## 2011-05-20 ENCOUNTER — Ambulatory Visit (HOSPITAL_COMMUNITY)
Admission: RE | Admit: 2011-05-20 | Discharge: 2011-05-20 | Disposition: A | Discharge status: Home / Self Care | Payer: Self-pay | Source: Ambulatory Visit | Attending: Family Medicine | Admitting: Family Medicine

## 2011-05-20 DIAGNOSIS — Z1231 Encounter for screening mammogram for malignant neoplasm of breast: Secondary | ICD-10-CM | POA: Insufficient documentation

## 2011-06-03 ENCOUNTER — Other Ambulatory Visit: Payer: Self-pay | Admitting: *Deleted

## 2011-06-03 MED ORDER — QUINAPRIL-HYDROCHLOROTHIAZIDE 20-12.5 MG PO TABS: 2.0000 | ORAL_TABLET | Freq: Every day | ORAL | Status: DC

## 2011-08-19 ENCOUNTER — Encounter: Payer: Self-pay | Admitting: Internal Medicine

## 2011-08-19 ENCOUNTER — Ambulatory Visit (INDEPENDENT_AMBULATORY_CARE_PROVIDER_SITE_OTHER): Payer: Self-pay | Admitting: Internal Medicine

## 2011-08-19 VITALS — BP 142/88 | HR 74 | Temp 98.6°F | Wt 261.9 lb

## 2011-08-19 DIAGNOSIS — N951 Menopausal and female climacteric states: Secondary | ICD-10-CM

## 2011-08-19 DIAGNOSIS — K921 Melena: Secondary | ICD-10-CM

## 2011-08-19 DIAGNOSIS — N926 Irregular menstruation, unspecified: Secondary | ICD-10-CM

## 2011-08-19 DIAGNOSIS — E119 Type 2 diabetes mellitus without complications: Secondary | ICD-10-CM

## 2011-08-19 DIAGNOSIS — Z79899 Other long term (current) drug therapy: Secondary | ICD-10-CM

## 2011-08-19 DIAGNOSIS — I10 Essential (primary) hypertension: Secondary | ICD-10-CM

## 2011-08-19 LAB — POCT GLYCOSYLATED HEMOGLOBIN (HGB A1C): Hemoglobin A1C: 8.7

## 2011-08-19 LAB — GLUCOSE, CAPILLARY: Glucose-Capillary: 246 mg/dL — ABNORMAL HIGH (ref 70–99)

## 2011-08-19 MED ORDER — ESTROGENS CONJUGATED 0.625 MG PO TABS: 0.6250 mg | ORAL_TABLET | Freq: Every day | ORAL | Status: DC

## 2011-08-19 MED ORDER — INSULIN NPH ISOPHANE & REGULAR (70-30) 100 UNIT/ML ~~LOC~~ SUSP: SUBCUTANEOUS | Status: DC

## 2011-08-19 NOTE — Assessment & Plan Note (Signed)
Perimenausal.

## 2011-08-19 NOTE — Assessment & Plan Note (Signed)
Change insulin to 32 units in morning and 15 at night. Encourage weight loss. Eye exam is on August 6th. Follow up in 3 months.

## 2011-08-19 NOTE — Patient Instructions (Signed)
Perimenopause  Perimenopause is the time when your body begins to move into the menopause (no menstrual period for 12 straight months). It is a natural process. Perimenopause can begin 2 to 8 years before the menopause and usually lasts for one year after the menopause. During this time, your ovaries may or may not produce an egg. The ovaries vary in their production of estrogen and progesterone hormones each month. This can cause irregular menstrual periods, difficulty in getting pregnant, vaginal bleeding between periods and uncomfortable symptoms.  CAUSES   Irregular production of the ovarian hormones, estrogen and progesterone, and not ovulating every month.   Other causes include:   Tumor of the pituitary gland in the brain.   Medical disease that affects the ovaries.   Radiation treatment.   Chemotherapy.   Unknown causes.   Heavy smoking and excessive alcohol intake can bring on perimenopause sooner.  SYMPTOMS     Hot flashes.   Night sweats.   Irregular menstrual periods.   Decrease sex drive.   Vaginal dryness.   Headaches.   Mood swings.   Depression.   Memory problems.   Irritability.   Tiredness.   Weight gain.   Trouble getting pregnant.   The beginning of losing bone cells (osteoporosis).   The beginning of hardening of the arteries (atherosclerosis).  DIAGNOSIS    Your caregiver will make a diagnosis by analyzing your age, menstrual history and your symptoms. They will do a physical exam noting any changes in your body, especially your female organs. Female hormone tests may or may not be helpful depending on the amount and when you produce the female hormones. However, other hormone tests may be helpful (ex. thyroid hormone) to rule out other problems.  TREATMENT    The decision to treat during the perimenopause should be made by you and your caregiver depending on how the symptoms are affecting you and your life style. There are various treatments available such as:    Treating individual symptoms with a specific medication for that symptom (ex. tranquilizer for depression).   Herbal medications that can help specific symptoms.   Counseling.   Group therapy.   No treatment.  HOME CARE INSTRUCTIONS     Before seeing your caregiver, make a list of your menstrual periods (when the occur, how heavy they are, how long between periods and how long they last), your symptoms and when they started.   Take the medication as recommended by your caregiver.   Sleep and rest.   Exercise.   Eat a diet that contains calcium (good for your bones) and soy (acts like estrogen hormone).   Do not smoke.   Avoid alcoholic beverages.   Taking vitamin E may help in certain cases.   Take calcium and vitamin D supplements to help prevent bone loss.   Group therapy is sometimes helpful.   Acupuncture may help in some cases.  SEEK MEDICAL CARE IF:     You have any of the above and want to know if it is perimenopause.   You want advice and treatment for any of your symptoms mentioned above.   You need a referral to a specialist (gynecologist, psychiatrist or psychologist).  SEEK IMMEDIATE MEDICAL CARE IF:     You have vaginal bleeding.   Your period lasts longer than 8 days.   You periods are recurring sooner than 21 days.   You have bleeding after intercourse.   You have severe depression.   You have pain   when you urinate.   You have severe headaches.   You develop vision problems.  Document Released: 04/10/2004 Document Revised: 02/20/2011 Document Reviewed: 12/30/2007  [employer] Patient Information 2012 [employer].

## 2011-08-19 NOTE — Progress Notes (Signed)
  Subjective:    Patient ID: Ashley Long, female    DOB: November 15, 1960, 51 y.o.   MRN: [id number]  HPI Patient is here today for regular follow up.  Her CBGs are running in >180's during the daytime. The average night CBG's are well controlled. Hba1c is 8.7.   BP is well controlled at this time.  Patient complaints of hot flashes during anytime of the day and it is severe enough to interfere with her day to day activities. She requests medication assistance with her hot flashes.  She also complains of abdominal cramps during her menstrual cycle.  No other complaints.     Review of Systems  Constitutional: Negative for fever, activity change and appetite change.  HENT: Negative for sore throat.   Respiratory: Negative for cough and shortness of breath.   Cardiovascular: Negative for chest pain and leg swelling.  Gastrointestinal: Negative for nausea, abdominal pain, diarrhea, constipation and abdominal distention.  Genitourinary: Negative for frequency, hematuria and difficulty urinating.  Neurological: Negative for dizziness and headaches.  Psychiatric/Behavioral: Negative for suicidal ideas and behavioral problems.       Objective:   Physical Exam  Constitutional: She is oriented to person, place, and time. She appears well-developed and well-nourished.  HENT:  Head: Normocephalic and atraumatic.  Eyes: Conjunctivae and EOM are normal. Pupils are equal, round, and reactive to light. No scleral icterus.  Neck: Normal range of motion. Neck supple. No JVD present. No thyromegaly present.  Cardiovascular: Normal rate, regular rhythm, normal heart sounds and intact distal pulses.  Exam reveals no gallop and no friction rub.   No murmur heard. Pulmonary/Chest: Effort normal and breath sounds normal. No respiratory distress. She has no wheezes. She has no rales.  Abdominal: Soft. Bowel sounds are normal. She exhibits no distension and no mass. There is no tenderness. There is no  rebound and no guarding.  Musculoskeletal: Normal range of motion. She exhibits no edema and no tenderness.  Lymphadenopathy:    She has no cervical adenopathy.  Neurological: She is alert and oriented to person, place, and time.  Psychiatric: She has a normal mood and affect. Her behavior is normal.          Assessment & Plan:

## 2011-08-19 NOTE — Assessment & Plan Note (Addendum)
Much better controlled at this time. No change in therapy. Repeat BP is 142/88.

## 2011-08-19 NOTE — Assessment & Plan Note (Signed)
Patient has been drinking metamucil at night time and she has not noticed any bleeding recently.

## 2011-08-19 NOTE — Assessment & Plan Note (Addendum)
I will start the patient on estrogen replacement therapy for hot flashes. The benefits and harms were discussed in detail including possibility of recurrence on stopping the therapy. This will be short term treatment to her through menopausal phase. Patient was prescribed premarin 0.625 mg daily. She was asked to take ibuprofen for abdominal cramps.

## 2011-10-29 ENCOUNTER — Other Ambulatory Visit: Payer: Self-pay | Admitting: Internal Medicine

## 2011-10-29 DIAGNOSIS — E119 Type 2 diabetes mellitus without complications: Secondary | ICD-10-CM

## 2011-11-06 NOTE — Addendum Note (Signed)
Addended by: Hassan Buckler on: 11/06/2011 09:16 AM   Modules accepted: Orders

## 2011-11-10 ENCOUNTER — Encounter: Payer: Self-pay | Admitting: Internal Medicine

## 2011-11-10 ENCOUNTER — Ambulatory Visit (INDEPENDENT_AMBULATORY_CARE_PROVIDER_SITE_OTHER): Payer: Self-pay | Admitting: Internal Medicine

## 2011-11-10 VITALS — BP 154/98 | HR 78 | Temp 98.7°F | Resp 20 | Ht 68.0 in | Wt 262.7 lb

## 2011-11-10 DIAGNOSIS — E119 Type 2 diabetes mellitus without complications: Secondary | ICD-10-CM

## 2011-11-10 DIAGNOSIS — Z79899 Other long term (current) drug therapy: Secondary | ICD-10-CM

## 2011-11-10 DIAGNOSIS — E785 Hyperlipidemia, unspecified: Secondary | ICD-10-CM

## 2011-11-10 DIAGNOSIS — I10 Essential (primary) hypertension: Secondary | ICD-10-CM

## 2011-11-10 LAB — LIPID PANEL
Cholesterol: 114 mg/dL (ref 0–200)
HDL: 43 mg/dL
LDL Cholesterol: 51 mg/dL (ref 0–99)
Total CHOL/HDL Ratio: 2.7 ratio
Triglycerides: 99 mg/dL
VLDL: 20 mg/dL (ref 0–40)

## 2011-11-10 LAB — GLUCOSE, CAPILLARY: Glucose-Capillary: 125 mg/dL — ABNORMAL HIGH (ref 70–99)

## 2011-11-10 LAB — POCT GLYCOSYLATED HEMOGLOBIN (HGB A1C): Hemoglobin A1C: 8.6

## 2011-11-10 MED ORDER — INSULIN NPH ISOPHANE & REGULAR (70-30) 100 UNIT/ML ~~LOC~~ SUSP: SUBCUTANEOUS | Status: DC

## 2011-11-10 NOTE — Assessment & Plan Note (Signed)
Lab Results  Component Value Date   HGBA1C 8.6 11/10/2011   HGBA1C 8.5 02/05/2010   CREATININE 0.94 10/15/2010   CREATININE 1.07 02/05/2010   MICROALBUR 11.46* 03/06/2009   MICRALBCREAT 65.1* 03/06/2009   CHOL 124 10/15/2010   HDL 44 10/15/2010   TRIG 113 10/15/2010    Last eye exam and foot exam:    Component Value Date/Time   HMDIABEYEEXA no diabetic retinopathy 09/18/2009   HMDIABFOOTEX done 12/03/2009  Last eye exam = 10/21/11  Assessment: Diabetes control: not controlled Progress toward goals: unchanged Barriers to meeting goals: financial need  Plan: Diabetes treatment: Continue glipizide 10 BID, metformin 1000 BID; increase 70/30 to 37u qAM and 20u qPM - patient understand what to do when she feels hypoglycemic and confirms it with CBG check Refer to: diabetes educator for medical nutrition therapy Instruction/counseling given: reminded to bring blood glucose meter & log to each visit, reminded to bring medications to each visit, discussed the need for weight loss and discussed diet

## 2011-11-10 NOTE — Patient Instructions (Signed)
-  Please increase your morning insulin to 37 units 15 min prior to breakfast and your evening insulin to 20 units 15 minutes prior to supper.  -Please be sure to meet with Ashley Long.  The biggest thing we can do is help with weight loss!  Please be sure to bring all of your medications with you to every visit.  Should you have any new or worsening symptoms, please be sure to call the clinic at [phone].

## 2011-11-10 NOTE — Progress Notes (Signed)
Subjective:   Patient ID: [name] female   DOB: 1960-08-27 51 y.o.   MRN: [id number]  HPI: [name] is a 51 y.o. woman with history of diabetes, hyperlipidemia and hypertension who presents today for routine followup. She has no complaints today.  1. Diabetes: Patient checks blood sugars 3-5 times a day. She is above target 64% of the time, within target 27% a time and below target 8% of the time. She notes 2 episodes of hypoglycemia, both which were symptomatic, and she resolved hypoglycemia with juice. She has more highs and lows. She is compliant with Glucotrol, metformin, and insulin 70/30.  She is trying to exercise  Her diet is as follows: She has cereal, all milk, juice for breakfast; salad, broiled fish, yogurt, fruit for lunch; salad or baked chicken for dinner. Occasionally her lunch is a little bit more sporadic because she is at work, and she will eat crackers. She has not with [name], our dietitian, in the past, but would like to meet with her again.   2. Hypertension: she reports compliance with her extensive antihypertensive regimen. She denies chest pain or shortness of breath or headache.   Past Medical History  Diagnosis Date  . DIABETES MELLITUS, TYPE II 06/02/2006    Last HBA1C 8.7.   . HYPERLIPIDEMIA 06/02/2006    Qualifier: Diagnosis of  By: Noel Gerold MD, Christiane Ha    . HYPERTENSION 06/02/2006    Filed Vitals:   01/21/11 1134  BP: 162/98  Pulse: 80  Temp: 97.4 F (36.3 C)  TempSrc: Oral  Resp: 20  Height: 5\' 8"  (1.727 m)  Weight: 261 lb (118.389 kg)       Current Outpatient Prescriptions  Medication Sig Dispense Refill  . amLODipine (NORVASC) 10 MG tablet Take 1 tablet (10 mg total) by mouth at bedtime.  30 tablet  11  . aspirin 81 MG EC tablet Take 81 mg by mouth daily.        Marland Kitchen atorvastatin (LIPITOR) 20 MG tablet Take 1 tablet (20 mg total) by mouth daily.  90 tablet  4  . Blood Glucose Monitoring Suppl (WAVESENSE PRESTO) W/DEVICE KIT 1 each by Does not  apply route 2 (two) times daily.  1 each  0  . cloNIDine (CATAPRES) 0.3 MG tablet Take 1 tablet (0.3 mg total) by mouth 2 (two) times daily.  180 tablet  1  . estrogens, conjugated, (PREMARIN) 0.625 MG tablet Take 1 tablet (0.625 mg total) by mouth daily. Take daily for 21 days then do not take for 7 days.  30 tablet  3  . glipiZIDE (GLUCOTROL) 10 MG tablet Take 1 tablet (10 mg total) by mouth 2 (two) times daily.  60 tablet  11  . glucose blood test strip Use to check blood sugar 3-4 times daily  200 each  12  . hydrocortisone (ANUSOL-HC) 25 MG suppository Place 1 suppository (25 mg total) rectally every 12 (twelve) hours.  4 suppository  1  . insulin NPH-insulin regular (NOVOLIN 70/30) (70-30) 100 UNIT/ML injection 32 units in the AM and 15 units in the PM 15 minutes prior to food  10 mL  prn  . labetalol (NORMODYNE) 200 MG tablet Take 2 tablets (400 mg total) by mouth 2 (two) times daily.  360 tablet  2  . Lancets 30G MISC 1 each by Does not apply route 2 (two) times daily.  100 each  12  . metFORMIN (GLUCOPHAGE) 1000 MG tablet Take 1 tablet (1,000 mg total)  by mouth 2 (two) times daily.  60 tablet  11  . Multiple Vitamins-Minerals (CENTRUM) tablet Take 1 tablet by mouth daily.        . quinapril-hydrochlorothiazide (ACCURETIC) 20-12.5 MG per tablet Take 2 tablets by mouth daily.  60 tablet  5   No family history on file. History   Social History  . Marital Status: Single    Spouse Name: N/A    Number of Children: N/A  . Years of Education: N/A   Social History Main Topics  . Smoking status: Former Smoker    Quit date: 10/15/1982  . Smokeless tobacco: None  . Alcohol Use: None  . Drug Use: None  . Sexually Active: None   Other Topics Concern  . None   Social History Narrative   Financial assistance approved for 100% discount at [facility] and has [facility] card per Gavin [place]11/29/11   Review of Systems: Constitutional: Denies fever, chills, diaphoresis, appetite change and fatigue.    HEENT: Denies photophobia, eye pain, redness, hearing loss, ear pain, congestion, sore throat, rhinorrhea, sneezing, mouth sores, trouble swallowing, neck pain, neck stiffness and tinnitus.   Respiratory: Denies SOB, DOE, cough, chest tightness,  and wheezing.   Cardiovascular: Denies chest pain, palpitations and leg swelling.  Gastrointestinal: Denies nausea, vomiting, abdominal pain, diarrhea, constipation, blood in stool and abdominal distention.  Genitourinary: Denies dysuria, urgency, frequency, hematuria, flank pain and difficulty urinating.  Musculoskeletal: Denies myalgias, back pain, joint swelling, arthralgias and gait problem.  Skin: Denies pallor, rash and wound.  Neurological: Denies dizziness, seizures, syncope, weakness, light-headedness, numbness and headaches.  Psychiatric/Behavioral: Denies suicidal ideation, mood changes, confusion, nervousness, sleep disturbance and agitation  Objective:  Physical Exam: Filed Vitals:   11/10/11 1357  BP: 154/98  Pulse: 78  Temp: 98.7 F (37.1 C)  TempSrc: Oral  Resp: 20  Height: 5\' 8"  (1.727 m)  Weight: 262 lb 11.2 oz (119.16 kg)  SpO2: 100%   Constitutional: Vital signs reviewed.  Patient is an obese woman in no acute distress and cooperative with exam.  Mouth: no erythema or exudates, MMM, poor dentition  Eyes: PERRL, EOMI, conjunctivae normal, No scleral icterus.  Cardiovascular: RRR, S1 normal, S2 normal, no MRG, pulses symmetric and intact bilaterally Pulmonary/Chest: CTAB, no wheezes, rales, or rhonchi Abdominal: Soft. Non-tender, non-distended, bowel sounds are normal, no masses, organomegaly, or guarding present.  Musculoskeletal: No joint deformities, erythema, or stiffness, ROM full and no nontender Hematology: no cervical, inginal, or axillary adenopathy.  Neurological: A&O x3, Strength is normal and symmetric bilaterally, cranial nerve II-XII are grossly intact, no focal motor deficit, sensory intact to light touch  bilaterally.  Skin: Warm, dry and intact. No rash, cyanosis, or clubbing.  Psychiatric: Normal mood and affect. speech and behavior is normal. Judgment and thought content normal. Cognition and memory are normal.   Assessment & Plan:   Case and care discussed with Dr. Aundria Rud. Patient to return in 3 months for diabetes followup. Please see problem-oriented charting for further details.

## 2011-11-10 NOTE — Assessment & Plan Note (Signed)
Last LDL = 57 1 year prior. She is compliant with Lipitor 20 qHS.  Continue current mgmt.  Lipid profile today.

## 2011-11-10 NOTE — Assessment & Plan Note (Addendum)
Lab Results  Component Value Date   NA 138 10/15/2010   K 4.3 10/15/2010   CL 102 10/15/2010   CO2 21 10/15/2010   BUN 14 10/15/2010   CREATININE 0.94 10/15/2010   CREATININE 1.07 02/05/2010    BP Readings from Last 3 Encounters:  11/10/11 154/98  08/19/11 142/88  04/28/11 152/88    Assessment: Hypertension control:  mildly elevated  Progress toward goals:  deteriorated Barriers to meeting goals:  no barriers identified  Plan: Hypertension treatment:  continue current medications - patient has been controlled at last visit - will follow at next visit [facility] today

## 2011-11-11 ENCOUNTER — Telehealth: Payer: Self-pay | Admitting: *Deleted

## 2011-11-11 LAB — COMPREHENSIVE METABOLIC PANEL
ALT: 15 U/L (ref 0–35)
AST: 16 U/L (ref 0–37)
Calcium: 9.4 mg/dL (ref 8.4–10.5)
Chloride: 101 mEq/L (ref 96–112)
Creat: 0.89 mg/dL (ref 0.50–1.10)
Potassium: 3.8 mEq/L (ref 3.5–5.3)
Sodium: 137 mEq/L (ref 135–145)
Total Protein: 6.8 g/dL (ref 6.0–8.3)

## 2011-11-11 NOTE — Telephone Encounter (Signed)
Call from pt stating that she received paperwork in the mail form [employer].  She call the number [phone] to inquire about it and was told that she can no longer get Norvasc and Lipitor free from their program.  Generic medications are now available so [employer] dropped the Brand names from their pt assistance program.  They did state patient can contact them back and inquire about the [employer] program where pt would receive a discount card up to 36% off the cost of the med.  Pt states that she receives all her medications through a medication assistance program in the mail.  She thought all her meds came from the same company, but that is not the case. Pt will see if the can [facility] and get her meds there or though the MAP program.  She will contact me back and let me know.Kingsley Spittle Cassady8/27/201311:11 AM

## 2011-11-12 ENCOUNTER — Ambulatory Visit: Payer: Self-pay | Admitting: Internal Medicine

## 2011-11-12 ENCOUNTER — Encounter (HOSPITAL_COMMUNITY): Payer: Self-pay | Admitting: *Deleted

## 2011-11-12 ENCOUNTER — Inpatient Hospital Stay (HOSPITAL_COMMUNITY)
Admission: AD | Admit: 2011-11-12 | Discharge: 2011-11-12 | Disposition: A | Discharge status: Home / Self Care | Payer: Self-pay | Source: Ambulatory Visit | Attending: Obstetrics & Gynecology | Admitting: Obstetrics & Gynecology

## 2011-11-12 ENCOUNTER — Inpatient Hospital Stay (HOSPITAL_COMMUNITY): Payer: Self-pay

## 2011-11-12 DIAGNOSIS — N949 Unspecified condition associated with female genital organs and menstrual cycle: Secondary | ICD-10-CM | POA: Insufficient documentation

## 2011-11-12 DIAGNOSIS — N938 Other specified abnormal uterine and vaginal bleeding: Secondary | ICD-10-CM | POA: Insufficient documentation

## 2011-11-12 DIAGNOSIS — N926 Irregular menstruation, unspecified: Secondary | ICD-10-CM

## 2011-11-12 DIAGNOSIS — R109 Unspecified abdominal pain: Secondary | ICD-10-CM | POA: Insufficient documentation

## 2011-11-12 LAB — URINE MICROSCOPIC-ADD ON

## 2011-11-12 LAB — URINALYSIS, ROUTINE W REFLEX MICROSCOPIC
Glucose, UA: >1000 mg/dL — AB
Ketones, ur: NEGATIVE mg/dL
Leukocytes, UA: NEGATIVE
pH: 5.5 (ref 5.0–8.0)

## 2011-11-12 LAB — POCT PREGNANCY, URINE: Preg Test, Ur: NEGATIVE

## 2011-11-12 LAB — CBC
HCT: 38.4 % (ref 36.0–46.0)
MCHC: 33.6 g/dL (ref 30.0–36.0)
MCV: 87.1 fL (ref 78.0–100.0)
RDW: 12.9 % (ref 11.5–15.5)

## 2011-11-12 LAB — WET PREP, GENITAL
Clue Cells Wet Prep HPF POC: NONE SEEN
Trich, Wet Prep: NONE SEEN

## 2011-11-12 MED ORDER — MEGESTROL ACETATE 40 MG PO TABS: ORAL_TABLET | ORAL | Status: AC

## 2011-11-12 MED ORDER — SIMVASTATIN 40 MG PO TABS: 40.0000 mg | ORAL_TABLET | Freq: Every evening | ORAL | Status: DC

## 2011-11-12 MED ORDER — AMLODIPINE BESYLATE 10 MG PO TABS: 10.0000 mg | ORAL_TABLET | Freq: Every day | ORAL | Status: DC

## 2011-11-12 MED ORDER — KETOROLAC TROMETHAMINE 60 MG/2ML IM SOLN
60.0000 mg | Freq: Once | INTRAMUSCULAR | Status: AC
  Administered 2011-11-12: 60 mg via INTRAMUSCULAR
  Filled 2011-11-12: qty 2

## 2011-11-12 NOTE — Telephone Encounter (Addendum)
Pt unable to get her meds from the [facility].  The atorvastatin20 mg #30 will cost her $59.32 and the amlodipine 10 mg #30 will cost her $26.68 at [facility]. I informed her she could get the amlodipine for 3.99 at [facility] and MD could change the atorvastatin to a cheaper med.  Will forward information to Dr Eben Burow for review.Kingsley Spittle Cassady8/28/20132:43 PM

## 2011-11-12 NOTE — Addendum Note (Signed)
Addended by: Lars Mage on: 11/12/2011 02:54 PM   Modules accepted: Orders

## 2011-11-12 NOTE — MAU Provider Note (Signed)
History     CSN: [id number]  Arrival date and time: 11/12/11 1658   First Provider Initiated Contact with Patient 11/12/11 1746      Chief Complaint  Patient presents with  . Abdominal Pain   HPI Ashley Long 51 y.o. Comes to MAU with lower abdominal pain and vaginal bleeding since Monday evening.  Has not taken anything for pain.  Reports having 2 periods a month - has been bleeding for what would be a full heavy period every 2 weeks.  Hx of BTL.  OB History    Grav Para Term Preterm Abortions TAB SAB Ect Mult Living   1 1 1       1       Past Medical History  Diagnosis Date  . DIABETES MELLITUS, TYPE II 06/02/2006    Last HBA1C 8.7.   . HYPERLIPIDEMIA 06/02/2006    Qualifier: Diagnosis of  By: Noel Gerold MD, Christiane Ha    . HYPERTENSION 06/02/2006    Filed Vitals:   01/21/11 1134  BP: 162/98  Pulse: 80  Temp: 97.4 F (36.3 C)  TempSrc: Oral  Resp: 20  Height: 5\' 8"  (1.727 m)  Weight: 261 lb (118.389 kg)       Past Surgical History  Procedure Date  . Tubal ligation     Family History  Problem Relation Age of Onset  . Other Neg Hx     History  Substance Use Topics  . Smoking status: Former Smoker    Quit date: 10/15/1982  . Smokeless tobacco: Not on file  . Alcohol Use: No    Allergies: No Known Allergies  Prescriptions prior to admission  Medication Sig Dispense Refill  . amLODipine (NORVASC) 10 MG tablet Take 1 tablet (10 mg total) by mouth at bedtime.  30 tablet  11  . aspirin 81 MG EC tablet Take 81 mg by mouth daily.        Marland Kitchen atorvastatin (LIPITOR) 20 MG tablet Take 1 tablet (20 mg total) by mouth daily.  90 tablet  4  . Blood Glucose Monitoring Suppl (WAVESENSE PRESTO) W/DEVICE KIT 1 each by Does not apply route 2 (two) times daily.  1 each  0  . cloNIDine (CATAPRES) 0.3 MG tablet Take 1 tablet (0.3 mg total) by mouth 2 (two) times daily.  180 tablet  1  . glipiZIDE (GLUCOTROL) 10 MG tablet Take 1 tablet (10 mg total) by mouth 2 (two) times daily.  60  tablet  11  . glucose blood test strip Use to check blood sugar 3-4 times daily  200 each  12  . hydrocortisone (ANUSOL-HC) 25 MG suppository Place 1 suppository (25 mg total) rectally every 12 (twelve) hours.  4 suppository  1  . insulin NPH-insulin regular (NOVOLIN 70/30) (70-30) 100 UNIT/ML injection 37 units in the AM and 20 units in the PM 15 minutes prior to food  10 mL  prn  . labetalol (NORMODYNE) 200 MG tablet Take 2 tablets (400 mg total) by mouth 2 (two) times daily.  360 tablet  2  . Lancets 30G MISC 1 each by Does not apply route 2 (two) times daily.  100 each  12  . metFORMIN (GLUCOPHAGE) 1000 MG tablet Take 1 tablet (1,000 mg total) by mouth 2 (two) times daily.  60 tablet  11  . Multiple Vitamins-Minerals (CENTRUM) tablet Take 1 tablet by mouth daily.        . quinapril-hydrochlorothiazide (ACCURETIC) 20-12.5 MG per tablet Take 2 tablets by mouth  daily.  60 tablet  5  . DISCONTD: estrogens, conjugated, (PREMARIN) 0.625 MG tablet Take 1 tablet (0.625 mg total) by mouth daily. Take daily for 21 days then do not take for 7 days.  30 tablet  3  . simvastatin (ZOCOR) 40 MG tablet Take 1 tablet (40 mg total) by mouth every evening.  30 tablet  11   Has never taken Premarin - did not get prescription filled Review of Systems  Constitutional: Negative for fever.  Gastrointestinal: Positive for abdominal pain. Negative for nausea, vomiting, diarrhea and constipation.  Genitourinary: Negative for dysuria.       Vaginal bleeding   Physical Exam   Blood pressure 157/97, pulse 88, temperature 98.3 F (36.8 C), temperature source Oral, resp. rate 18, height 5\' 8"  (1.727 m), weight 262 lb (118.842 kg), last menstrual period 10/20/2011.  Physical Exam  Nursing note and vitals reviewed. Constitutional: Ashley Long is oriented to person, place, and time. Ashley Long appears well-developed and well-nourished. No distress.       Obese  HENT:  Head: Normocephalic.  Eyes: EOM are normal.  Neck: Neck supple.   GI: Soft. There is tenderness. There is no rebound and no guarding.  Genitourinary:       Speculum exam: Vagina - Small amount of bloody discharge, no odor Cervix - No contact bleeding Bimanual exam: Cervix closed Uterus mildly tender, normal size Adnexa non tender, no masses bilaterally GC/Chlam, wet prep done Chaperone present for exam.  Musculoskeletal: Normal range of motion.  Neurological: Ashley Long is alert and oriented to person, place, and time.  Skin: Skin is warm and dry.  Psychiatric: Ashley Long has a normal mood and affect.    MAU Course  Procedures Reviewed plan of care with Dr. Emelda Fear Results for orders placed during the hospital encounter of 11/12/11 (from the past 24 hour(s))  URINALYSIS, ROUTINE W REFLEX MICROSCOPIC     Status: Abnormal   Collection Time   11/12/11  5:20 PM      Component Value Range   Color, Urine AMBER (*) YELLOW   APPearance CLOUDY (*) CLEAR   Specific Gravity, Urine >1.030 (*) 1.005 - 1.030   pH 5.5  5.0 - 8.0   Glucose, UA >1000 (*) NEGATIVE mg/dL   Hgb urine dipstick LARGE (*) NEGATIVE   Bilirubin Urine SMALL (*) NEGATIVE   Ketones, ur NEGATIVE  NEGATIVE mg/dL   Protein, ur 30 (*) NEGATIVE mg/dL   Urobilinogen, UA 0.2  0.0 - 1.0 mg/dL   Nitrite NEGATIVE  NEGATIVE   Leukocytes, UA NEGATIVE  NEGATIVE  URINE MICROSCOPIC-ADD ON     Status: Abnormal   Collection Time   11/12/11  5:20 PM      Component Value Range   Squamous Epithelial / LPF FEW (*) RARE   WBC, UA 3-6  <3 WBC/hpf   RBC / HPF TOO NUMEROUS TO COUNT  <3 RBC/hpf   Bacteria, UA MANY (*) RARE  POCT PREGNANCY, URINE     Status: Normal   Collection Time   11/12/11  5:26 PM      Component Value Range   Preg Test, Ur NEGATIVE  NEGATIVE  WET PREP, GENITAL     Status: Normal   Collection Time   11/12/11  5:51 PM      Component Value Range   Yeast Wet Prep HPF POC NONE SEEN  NONE SEEN   Trich, Wet Prep NONE SEEN  NONE SEEN   Clue Cells Wet Prep HPF POC NONE SEEN  NONE  SEEN   WBC, Wet  Prep HPF POC NONE SEEN  NONE SEEN  CBC     Status: Normal   Collection Time   11/12/11  6:10 PM      Component Value Range   WBC 8.9  4.0 - 10.5 K/uL   RBC 4.41  3.87 - 5.11 MIL/uL   Hemoglobin 12.9  12.0 - 15.0 g/dL   HCT 78.2  95.6 - 21.3 %   MCV 87.1  78.0 - 100.0 fL   MCH 29.3  26.0 - 34.0 pg   MCHC 33.6  30.0 - 36.0 g/dL   RDW 08.6  57.8 - 46.9 %   Platelets 260  150 - 400 K/uL   MDM Clinical Data: Irregular bleeding, irregular menses, lower  abdominal pain  TRANSABDOMINAL AND TRANSVAGINAL ULTRASOUND OF PELVIS  Technique: Both transabdominal and transvaginal ultrasound  examinations of the pelvis were performed. Transabdominal technique  was performed for global imaging of the pelvis including uterus,  ovaries, adnexal regions, and pelvic cul-de-sac.  It was necessary to proceed with endovaginal exam following the  transabdominal exam to visualize the endometrium.  Comparison: 09/04/2009  Findings:  Uterus: 10.1 cm length by 4.6 cm AP by 6.3 cm transverse. Normal  position and morphology without mass.  Endometrium: 6 mm thick, normal. No endometrial fluid.  Right ovary: 3.3 x 2.0 x 2.6 cm. Normal morphology without mass.  Left ovary: 3.0 x 1.4 x 2.3 cm. Normal morphology without mass.  Other findings: No free fluid or adnexal masses.  IMPRESSION:  Normal study.  No evidence of pelvic mass or other significant abnormality.    Assessment and Plan  Irregular bleeding  Plan Will send message to GYN clinic to schedule follow up appointment. There is no anemia. Rx Megace 40 mg PO BID (#60) 1 refill Continue all medications and appointments with your medical doctor. Had Toradol 60 mg IM for pain. REcommend Ibuprofen by the package directions for pain.  Zahari Fazzino 11/12/2011, 6:26 PM

## 2011-11-12 NOTE — MAU Note (Signed)
States that she is pre-menopausal; has been having irregular,heavy periods (2 periods a month) for past 3-6 months;

## 2011-11-13 NOTE — MAU Provider Note (Signed)
Attestation of Attending Supervision of Advanced Practitioner (CNM/NP): Evaluation and management procedures were performed by the Advanced Practitioner under my supervision and collaboration.  I have reviewed the Advanced Practitioner's note and chart, and I agree with the management and plan.  Sennie Borden, MD, FACOG Attending Obstetrician & Gynecologist Faculty Practice, [facility]  

## 2011-11-14 ENCOUNTER — Encounter: Payer: Self-pay | Admitting: Obstetrics & Gynecology

## 2011-11-19 ENCOUNTER — Other Ambulatory Visit: Payer: Self-pay | Admitting: *Deleted

## 2011-11-19 MED ORDER — CLONIDINE HCL 0.3 MG PO TABS: 0.3000 mg | ORAL_TABLET | Freq: Two times a day (BID) | ORAL | Status: DC

## 2011-11-19 MED ORDER — QUINAPRIL-HYDROCHLOROTHIAZIDE 20-12.5 MG PO TABS: 2.0000 | ORAL_TABLET | Freq: Every day | ORAL | Status: DC

## 2011-12-05 ENCOUNTER — Encounter: Payer: Self-pay | Admitting: Obstetrics & Gynecology

## 2011-12-16 ENCOUNTER — Ambulatory Visit (INDEPENDENT_AMBULATORY_CARE_PROVIDER_SITE_OTHER): Payer: Self-pay

## 2011-12-16 DIAGNOSIS — Z23 Encounter for immunization: Secondary | ICD-10-CM

## 2012-01-26 ENCOUNTER — Other Ambulatory Visit: Payer: Self-pay | Admitting: *Deleted

## 2012-01-26 MED ORDER — LABETALOL HCL 200 MG PO TABS: 400.0000 mg | ORAL_TABLET | Freq: Two times a day (BID) | ORAL | Status: DC

## 2012-02-09 ENCOUNTER — Encounter: Payer: Self-pay | Admitting: Internal Medicine

## 2012-02-09 ENCOUNTER — Ambulatory Visit (INDEPENDENT_AMBULATORY_CARE_PROVIDER_SITE_OTHER): Payer: Self-pay | Admitting: Internal Medicine

## 2012-02-09 VITALS — BP 148/82 | HR 97 | Temp 97.5°F | Wt 258.0 lb

## 2012-02-09 DIAGNOSIS — I10 Essential (primary) hypertension: Secondary | ICD-10-CM

## 2012-02-09 DIAGNOSIS — N939 Abnormal uterine and vaginal bleeding, unspecified: Secondary | ICD-10-CM

## 2012-02-09 DIAGNOSIS — E669 Obesity, unspecified: Secondary | ICD-10-CM

## 2012-02-09 DIAGNOSIS — Z299 Encounter for prophylactic measures, unspecified: Secondary | ICD-10-CM

## 2012-02-09 DIAGNOSIS — Z79899 Other long term (current) drug therapy: Secondary | ICD-10-CM

## 2012-02-09 DIAGNOSIS — N926 Irregular menstruation, unspecified: Secondary | ICD-10-CM

## 2012-02-09 DIAGNOSIS — E119 Type 2 diabetes mellitus without complications: Secondary | ICD-10-CM

## 2012-02-09 LAB — CBC
HCT: 41.3 % (ref 36.0–46.0)
Hemoglobin: 14.1 g/dL (ref 12.0–15.0)
MCH: 30 pg (ref 26.0–34.0)
MCHC: 34.1 g/dL (ref 30.0–36.0)
MCV: 87.9 fL (ref 78.0–100.0)
Platelets: 302 10*3/uL (ref 150–400)
RBC: 4.7 MIL/uL (ref 3.87–5.11)
RDW: 13.2 % (ref 11.5–15.5)
WBC: 9.1 10*3/uL (ref 4.0–10.5)

## 2012-02-09 LAB — POCT GLYCOSYLATED HEMOGLOBIN (HGB A1C): Hemoglobin A1C: 9.3

## 2012-02-09 LAB — GLUCOSE, CAPILLARY: Glucose-Capillary: 260 mg/dL — ABNORMAL HIGH (ref 70–99)

## 2012-02-09 MED ORDER — INSULIN NPH ISOPHANE & REGULAR (70-30) 100 UNIT/ML ~~LOC~~ SUSP: SUBCUTANEOUS | Status: DC

## 2012-02-09 MED ORDER — LABETALOL HCL 200 MG PO TABS: 600.0000 mg | ORAL_TABLET | Freq: Two times a day (BID) | ORAL | Status: DC

## 2012-02-09 NOTE — Progress Notes (Signed)
  Subjective:    Patient ID: Ashley Long, female    DOB: [birth year], 51 y.o.   MRN: [id number]  HPI  Patient comes in today for recurrent vaginal bleeding. She is perimenopausal and has been having irregular menstrual cycles for last several months. She was seen by gynecologist who started her on medications which helped to stop bleeding for a while. US abdomen and pelvis done 2 month ago did not reveal any masses at that time.   BP is 148/82 which is much better then what it has been in the past but still not at goal.  HbA1c is 9.3 today.    Review of Systems  Constitutional: Negative for fever, activity change and appetite change.  HENT: Negative for sore throat.   Respiratory: Negative for cough and shortness of breath.   Cardiovascular: Negative for chest pain and leg swelling.  Gastrointestinal: Negative for nausea, abdominal pain, diarrhea, constipation and abdominal distention.  Genitourinary: Positive for vaginal bleeding and menstrual problem. Negative for frequency, hematuria and difficulty urinating.  Neurological: Negative for dizziness and headaches.  Psychiatric/Behavioral: Negative for suicidal ideas and behavioral problems.       Objective:   Physical Exam  Constitutional: She is oriented to person, place, and time. She appears well-developed and well-nourished.  HENT:  Head: Normocephalic and atraumatic.  Eyes: Conjunctivae normal and EOM are normal. Pupils are equal, round, and reactive to light. No scleral icterus.  Neck: Normal range of motion. Neck supple. No JVD present. No thyromegaly present.  Cardiovascular: Normal rate, regular rhythm, normal heart sounds and intact distal pulses.  Exam reveals no gallop and no friction rub.   No murmur heard. Pulmonary/Chest: Effort normal and breath sounds normal. No respiratory distress. She has no wheezes. She has no rales.  Abdominal: Soft. Bowel sounds are normal. She exhibits no distension and no mass. There is  no tenderness. There is no rebound and no guarding.  Genitourinary:       Genital exam was done with Purnell Shoemaker in the room as assistant: Cervix visualized and there was some blood seen coming out of the cervical Os. Pap smear specimen was taken from cervical os and sent for pathology review. theer was some bleeding after pap smear sample. No masses, rash, friable mucosa visualized in the vaginal vault.  Musculoskeletal: Normal range of motion. She exhibits no edema and no tenderness.  Lymphadenopathy:    She has no cervical adenopathy.  Neurological: She is alert and oriented to person, place, and time.  Psychiatric: She has a normal mood and affect. Her behavior is normal.          Assessment & Plan:

## 2012-02-09 NOTE — Patient Instructions (Signed)
Calorie Counting Diet A calorie counting diet requires you to eat the number of calories that are right for you in a day. Calories are the measurement of how much energy you get from the food you eat. Eating the right amount of calories is important for staying at a healthy weight. If you eat too many calories, your body will store them as fat and you may gain weight. If you eat too few calories, you may lose weight. Counting the number of calories you eat during a day will help you know if you are eating the right amount. A Registered Dietitian can determine how many calories you need in a day. The amount of calories needed varies from person to person. If your goal is to lose weight, you will need to eat fewer calories. Losing weight can benefit you if you are overweight or have health problems such as heart disease, high blood pressure, or diabetes. If your goal is to gain weight, you will need to eat more calories. Gaining weight may be necessary if you have a certain health problem that causes your body to need more energy. TIPS Whether you are increasing or decreasing the number of calories you eat during a day, it may be hard to get used to changes in what you eat and drink. The following are tips to help you keep track of the number of calories you eat.  Measure foods at home with measuring cups. This helps you know the amount of food and number of calories you are eating.  Restaurants often serve food in amounts that are larger than 1 serving. While eating out, estimate how many servings of a food you are given. For example, a serving of cooked rice is  cup or about the size of half of a fist. Knowing serving sizes will help you be aware of how much food you are eating at restaurants.  Ask for smaller portion sizes or child-size portions at restaurants.  Plan to eat half of a meal at a restaurant. Take the rest home or share the other half with a friend.  Read the Nutrition Facts panel on  food labels for calorie content and serving size. You can find out how many servings are in a package, the size of a serving, and the number of calories each serving has.  For example, a package might contain 3 cookies. The Nutrition Facts panel on that package says that 1 serving is 1 cookie. Below that, it will say there are 3 servings in the container. The calories section of the Nutrition Facts label says there are 90 calories. This means there are 90 calories in 1 cookie (1 serving). If you eat 1 cookie you have eaten 90 calories. If you eat all 3 cookies, you have eaten 270 calories (3 servings x 90 calories = 270 calories). The list below tells you how big or small some common portion sizes are.  1 oz.........4 stacked dice.  3 oz.........Deck of cards.  1 tsp........Tip of little finger.  1 tbs........Thumb.  2 tbs........Golf ball.   cup.......Half of a fist.  1 cup........A fist. KEEP A FOOD LOG Write down every food item you eat, the amount you eat, and the number of calories in each food you eat during the day. At the end of the day, you can add up the total number of calories you have eaten. It may help to keep a list like the one below. Find out the calorie information by reading the   Nutrition Facts panel on food labels. Breakfast  Bran cereal (1 cup, 110 calories).  Fat-free milk ( cup, 45 calories). Snack  Apple (1 medium, 80 calories). Lunch  Spinach (1 cup, 20 calories).  Tomato ( medium, 20 calories).  Chicken breast strips (3 oz, 165 calories).  Shredded cheddar cheese ( cup, 110 calories).  Light Italian dressing (2 tbs, 60 calories).  Whole-wheat bread (1 slice, 80 calories).  Tub margarine (1 tsp, 35 calories).  Vegetable soup (1 cup, 160 calories). Dinner  Pork chop (3 oz, 190 calories).  Brown rice (1 cup, 215 calories).  Steamed broccoli ( cup, 20 calories).  Strawberries (1  cup, 65 calories).  Whipped cream (1 tbs, 50  calories). Daily Calorie Total: 1425 Document Released: 03/03/2005 Document Revised: 05/26/2011 Document Reviewed: 08/28/2006 [employer] Patient Information 2013 [employer].  

## 2012-02-10 NOTE — Assessment & Plan Note (Signed)
[facility] the bleeding resumed after cessation of therapy. The TV ultrasound was reviewed and no masses were seen. Physical exam today was did not reveal any masses or obvious abnormalities. There was blood coming out of cervical os which could be related to her menstrual cycles. I advised her to follow up with her gynecologist for continued support as this seems most likely related to her perimenopausal periods.  Patient understands the plan.

## 2012-02-10 NOTE — Assessment & Plan Note (Signed)
Pap smear was done today. 

## 2012-02-10 NOTE — Assessment & Plan Note (Signed)
I have increased her insulin to 40 units in am and 22 units at night time. Patient's Hba1c has increased since last visit.

## 2012-02-10 NOTE — Assessment & Plan Note (Signed)
Increase labetalol to 300 mg BID as after reviewing her home BP readings they were all in 140's systolics. Follow up in 2-3 months and continue to monitor BP readings.

## 2012-02-10 NOTE — Assessment & Plan Note (Signed)
Counseled on weight loss and given guidelines for calorie count.

## 2012-03-04 ENCOUNTER — Telehealth: Payer: Self-pay | Admitting: *Deleted

## 2012-03-04 ENCOUNTER — Other Ambulatory Visit: Payer: Self-pay | Admitting: Internal Medicine

## 2012-03-04 DIAGNOSIS — E119 Type 2 diabetes mellitus without complications: Secondary | ICD-10-CM

## 2012-03-04 MED ORDER — INSULIN NPH ISOPHANE & REGULAR (70-30) 100 UNIT/ML ~~LOC~~ SUSP: SUBCUTANEOUS | Status: DC

## 2012-03-04 NOTE — Telephone Encounter (Signed)
Pt called stating her insulin was increased at last visit to Novolin 70/30 -  40 units in AM and 22 units in PM Her Morning CBG runs about 112 but she is having lows in afternoon and at night.  Today after lunch cbg was 71 and last night cbg was 62. Would you consider decreasing AM insulin?  Pt # [id number]

## 2012-03-04 NOTE — Telephone Encounter (Signed)
Pt has been informed and will keep [place] posted.

## 2012-03-04 NOTE — Telephone Encounter (Signed)
Decrease insulin by 2 units to 38 units in AM. Keep night dose the same. See order in epic.

## 2012-04-07 ENCOUNTER — Telehealth: Payer: Self-pay | Admitting: *Deleted

## 2012-04-07 DIAGNOSIS — I10 Essential (primary) hypertension: Secondary | ICD-10-CM

## 2012-04-07 MED ORDER — LABETALOL HCL 200 MG PO TABS: 600.0000 mg | ORAL_TABLET | Freq: Two times a day (BID) | ORAL | Status: DC

## 2012-04-07 NOTE — Telephone Encounter (Deleted)
Pt called to inq

## 2012-04-07 NOTE — Telephone Encounter (Signed)
Pt called inquiring about a refill on her labetalol 200 mg tabs,  she takes 3 tabs AM and 3 tabs PM.  Rx never received by [facility], rx sent in electronically, will send to pcp for cosign.Kingsley Spittle Cassady1/22/20149:27 AM   Of note, [facility] will have to order this medication because of the large qty.Criss Alvine, Jakavion Bilodeau Cassady1/22/20149:28 AM

## 2012-04-12 MED ORDER — LABETALOL HCL 200 MG PO TABS: 600.0000 mg | ORAL_TABLET | Freq: Two times a day (BID) | ORAL | Status: DC

## 2012-04-20 NOTE — Telephone Encounter (Signed)
Thank you :)

## 2012-04-21 ENCOUNTER — Other Ambulatory Visit: Payer: Self-pay | Admitting: Internal Medicine

## 2012-04-21 DIAGNOSIS — Z1231 Encounter for screening mammogram for malignant neoplasm of breast: Secondary | ICD-10-CM

## 2012-04-26 ENCOUNTER — Ambulatory Visit (INDEPENDENT_AMBULATORY_CARE_PROVIDER_SITE_OTHER): Payer: No Typology Code available for payment source | Admitting: Internal Medicine

## 2012-04-26 VITALS — BP 130/78 | HR 85 | Temp 97.3°F | Wt 262.9 lb

## 2012-04-26 DIAGNOSIS — Z79899 Other long term (current) drug therapy: Secondary | ICD-10-CM

## 2012-04-26 DIAGNOSIS — E669 Obesity, unspecified: Secondary | ICD-10-CM

## 2012-04-26 DIAGNOSIS — I10 Essential (primary) hypertension: Secondary | ICD-10-CM

## 2012-04-26 DIAGNOSIS — E119 Type 2 diabetes mellitus without complications: Secondary | ICD-10-CM

## 2012-04-26 LAB — GLUCOSE, CAPILLARY: Glucose-Capillary: 217 mg/dL — ABNORMAL HIGH (ref 70–99)

## 2012-04-26 MED ORDER — METFORMIN HCL 1000 MG PO TABS: 1000.0000 mg | ORAL_TABLET | Freq: Two times a day (BID) | ORAL | Status: DC

## 2012-04-26 MED ORDER — LABETALOL HCL 200 MG PO TABS: 600.0000 mg | ORAL_TABLET | Freq: Two times a day (BID) | ORAL | Status: DC

## 2012-04-26 MED ORDER — QUINAPRIL-HYDROCHLOROTHIAZIDE 20-12.5 MG PO TABS: 2.0000 | ORAL_TABLET | Freq: Every day | ORAL | Status: DC

## 2012-04-26 MED ORDER — CLONIDINE HCL 0.3 MG PO TABS: 0.3000 mg | ORAL_TABLET | Freq: Two times a day (BID) | ORAL | Status: DC

## 2012-04-26 MED ORDER — INSULIN NPH ISOPHANE & REGULAR (70-30) 100 UNIT/ML ~~LOC~~ SUSP: SUBCUTANEOUS | Status: DC

## 2012-04-26 NOTE — Progress Notes (Signed)
  Subjective:    Patient ID: Ashley Long, female    DOB: January 03, 1961, 52 y.o.   MRN: [id number]  HPI  Patient is taking 35 units in morning and 20 in evening. Looking at [facility] logs she may benefit from increase by 2 points in PM. She lowered the recommended doses of insulin on her own as she was having hypoglycemia episodes. I could not see any such episodes on the CBG log sheet except once when after dinner cbg is 60.  Patient is going to jury duty next week and asking for early injection(7.30AM) which I said it should be fine as long as she takes early breakfast/snack along with the shot.  BP is well controlled.  She has no other complaints.    Review of Systems  Constitutional: Negative for fever, activity change and appetite change.  HENT: Negative for sore throat.   Respiratory: Negative for cough and shortness of breath.   Cardiovascular: Negative for chest pain and leg swelling.  Gastrointestinal: Negative for nausea, abdominal pain, diarrhea, constipation and abdominal distention.  Genitourinary: Negative for frequency, hematuria and difficulty urinating.  Neurological: Negative for dizziness and headaches.  Psychiatric/Behavioral: Negative for suicidal ideas and behavioral problems.       Objective:   Physical Exam  Constitutional: She is oriented to person, place, and time. She appears well-developed and well-nourished.  HENT:  Head: Normocephalic and atraumatic.  Eyes: Conjunctivae and EOM are normal. Pupils are equal, round, and reactive to light. No scleral icterus.  Neck: Normal range of motion. Neck supple. No JVD present. No thyromegaly present.  Cardiovascular: Normal rate, regular rhythm, normal heart sounds and intact distal pulses.  Exam reveals no gallop and no friction rub.   No murmur heard. Pulmonary/Chest: Effort normal and breath sounds normal. No respiratory distress. She has no wheezes. She has no rales.  Abdominal: Soft. Bowel sounds are normal. She  exhibits no distension and no mass. There is no tenderness. There is no rebound and no guarding.  Musculoskeletal: Normal range of motion. She exhibits no edema and no tenderness.  Lymphadenopathy:    She has no cervical adenopathy.  Neurological: She is alert and oriented to person, place, and time.  Psychiatric: She has a normal mood and affect. Her behavior is normal.          Assessment & Plan:

## 2012-04-26 NOTE — Assessment & Plan Note (Signed)
BP 130/78  Pulse 85  Temp(Src) 97.3 F (36.3 C) (Oral)  Wt 262 lb 14.4 oz (119.251 kg)  BMI 39.98 kg/m2  SpO2 99% Well controlled as above. Follow up in 3 months and refill medications.

## 2012-04-26 NOTE — Assessment & Plan Note (Signed)
Hba1c improved as compared to last time. Needs to readjust insulin dosage as discussed in HPI. Foot exam done today. Follow up in 3 months.

## 2012-04-26 NOTE — Assessment & Plan Note (Signed)
-   counseled on weight loss

## 2012-04-26 NOTE — Patient Instructions (Signed)
Please keep trying to lose weight. Aim for 1 pound a week and 4-5 pounds per month. Please note your changed insulin dosages today. Follow up in 3 months.

## 2012-05-25 ENCOUNTER — Ambulatory Visit (HOSPITAL_COMMUNITY)
Admission: RE | Admit: 2012-05-25 | Discharge: 2012-05-25 | Disposition: A | Discharge status: Home / Self Care | Payer: No Typology Code available for payment source | Source: Ambulatory Visit | Attending: Internal Medicine | Admitting: Internal Medicine

## 2012-05-25 DIAGNOSIS — Z1231 Encounter for screening mammogram for malignant neoplasm of breast: Secondary | ICD-10-CM

## 2012-06-29 ENCOUNTER — Other Ambulatory Visit: Payer: Self-pay | Admitting: *Deleted

## 2012-06-30 MED ORDER — GLIPIZIDE 10 MG PO TABS: 10.0000 mg | ORAL_TABLET | Freq: Two times a day (BID) | ORAL | Status: DC

## 2012-08-24 ENCOUNTER — Ambulatory Visit: Payer: No Typology Code available for payment source | Admitting: Internal Medicine

## 2012-08-24 ENCOUNTER — Ambulatory Visit: Payer: No Typology Code available for payment source

## 2012-09-23 ENCOUNTER — Other Ambulatory Visit: Payer: Self-pay

## 2012-10-29 ENCOUNTER — Ambulatory Visit (INDEPENDENT_AMBULATORY_CARE_PROVIDER_SITE_OTHER): Payer: No Typology Code available for payment source | Admitting: Internal Medicine

## 2012-10-29 ENCOUNTER — Encounter: Payer: Self-pay | Admitting: Internal Medicine

## 2012-10-29 VITALS — BP 150/90 | HR 82 | Ht 68.0 in | Wt 271.0 lb

## 2012-10-29 DIAGNOSIS — E119 Type 2 diabetes mellitus without complications: Secondary | ICD-10-CM

## 2012-10-29 DIAGNOSIS — I10 Essential (primary) hypertension: Secondary | ICD-10-CM

## 2012-10-29 DIAGNOSIS — E785 Hyperlipidemia, unspecified: Secondary | ICD-10-CM

## 2012-10-29 DIAGNOSIS — Z299 Encounter for prophylactic measures, unspecified: Secondary | ICD-10-CM

## 2012-10-29 LAB — GLUCOSE, CAPILLARY: Glucose-Capillary: 171 mg/dL — ABNORMAL HIGH (ref 70–99)

## 2012-10-29 MED ORDER — SIMVASTATIN 40 MG PO TABS: 40.0000 mg | ORAL_TABLET | Freq: Every evening | ORAL | Status: DC

## 2012-10-29 MED ORDER — AMLODIPINE BESYLATE 10 MG PO TABS: 10.0000 mg | ORAL_TABLET | Freq: Every day | ORAL | Status: DC

## 2012-10-29 MED ORDER — LABETALOL HCL 200 MG PO TABS: 600.0000 mg | ORAL_TABLET | Freq: Two times a day (BID) | ORAL | Status: DC

## 2012-10-29 NOTE — Patient Instructions (Addendum)
Please follow-up with Dr. Aundria Rud in 3-6 months.   Keep up the great work on your diabetes and blood pressure!  Keep bringing your meter and blood pressure cuff.  Remember to bring your medicines to the next visit.   We will set you up for a colonoscopy in October and have noted your preference for a Tuesday!  Diabetes Meal Planning Guide The diabetes meal planning guide is a tool to help you plan your meals and snacks. It is important for people with diabetes to manage their blood glucose (sugar) levels. Choosing the right foods and the right amounts throughout your day will help control your blood glucose. Eating right can even help you improve your blood pressure and reach or maintain a healthy weight. CARBOHYDRATE COUNTING MADE EASY When you eat carbohydrates, they turn to sugar. This raises your blood glucose level. Counting carbohydrates can help you control this level so you feel better. When you plan your meals by counting carbohydrates, you can have more flexibility in what you eat and balance your medicine with your food intake. Carbohydrate counting simply means adding up the total amount of carbohydrate grams in your meals and snacks. Try to eat about the same amount at each meal. Foods with carbohydrates are listed below. Each portion below is 1 carbohydrate serving or 15 grams of carbohydrates. Ask your dietician how many grams of carbohydrates you should eat at each meal or snack. Grains and Starches  1 slice bread.   English muffin or hotdog/hamburger bun.   cup cold cereal (unsweetened).   cup cooked pasta or rice.   cup starchy vegetables (corn, potatoes, peas, beans, winter squash).  1 tortilla (6 inches).   bagel.  1 waffle or pancake (size of a CD).   cup cooked cereal.  4 to 6 small crackers. *Whole grain is recommended. Fruit  1 cup fresh unsweetened berries, melon, papaya, pineapple.  1 small fresh fruit.   banana or mango.   cup fruit juice (4  oz unsweetened).   cup canned fruit in natural juice or water.  2 tbs dried fruit.  12 to 15 grapes or cherries. Milk and Yogurt  1 cup fat-free or 1% milk.  1 cup soy milk.  6 oz light yogurt with sugar-free sweetener.  6 oz low-fat soy yogurt.  6 oz plain yogurt. Vegetables  1 cup raw or  cup cooked is counted as 0 carbohydrates or a "free" food.  If you eat 3 or more servings at 1 meal, count them as 1 carbohydrate serving. Other Carbohydrates   oz chips or pretzels.   cup ice cream or frozen yogurt.   cup sherbet or sorbet.  2 inch square cake, no frosting.  1 tbs honey, sugar, jam, jelly, or syrup.  2 small cookies.  3 squares of graham crackers.  3 cups popcorn.  6 crackers.  1 cup broth-based soup.  Count 1 cup casserole or other mixed foods as 2 carbohydrate servings.  Foods with less than 20 calories in a serving may be counted as 0 carbohydrates or a "free" food. You may want to purchase a book or computer software that lists the carbohydrate gram counts of different foods. In addition, the nutrition facts panel on the labels of the foods you eat are a good source of this information. The label will tell you how big the serving size is and the total number of carbohydrate grams you will be eating per serving. Divide this number by 15 to obtain the  number of carbohydrate servings in a portion. Remember, 1 carbohydrate serving equals 15 grams of carbohydrate. SERVING SIZES Measuring foods and serving sizes helps you make sure you are getting the right amount of food. The list below tells how big or small some common serving sizes are.  1 oz.........4 stacked dice.  3 oz........Marland KitchenDeck of cards.  1 tsp.......Marland KitchenTip of little finger.  1 tbs......Marland KitchenMarland KitchenThumb.  2 tbs.......Marland KitchenGolf ball.   cup......Marland KitchenHalf of a fist.  1 cup.......Marland KitchenA fist. SAMPLE DIABETES MEAL PLAN Below is a sample meal plan that includes foods from the grain and starches, dairy,  vegetable, fruit, and meat groups. A dietician can individualize a meal plan to fit your calorie needs and tell you the number of servings needed from each food group. However, controlling the total amount of carbohydrates in your meal or snack is more important than making sure you include all of the food groups at every meal. You may interchange carbohydrate containing foods (dairy, starches, and fruits). The meal plan below is an example of a 2000 calorie diet using carbohydrate counting. This meal plan has 17 carbohydrate servings. Breakfast  1 cup oatmeal (2 carb servings).   cup light yogurt (1 carb serving).  1 cup blueberries (1 carb serving).   cup almonds. Snack  1 large apple (2 carb servings).  1 low-fat string cheese stick. Lunch  Chicken breast salad.  1 cup spinach.   cup chopped tomatoes.  2 oz chicken breast, sliced.  2 tbs low-fat [place] dressing.  12 whole-wheat crackers (2 carb servings).  12 to 15 grapes (1 carb serving).  1 cup low-fat milk (1 carb serving). Snack  1 cup carrots.   cup hummus (1 carb serving). Dinner  3 oz broiled salmon.  1 cup brown rice (3 carb servings). Snack  1  cups steamed broccoli (1 carb serving) drizzled with 1 tsp olive oil and lemon juice.  1 cup light pudding (2 carb servings). DIABETES MEAL PLANNING WORKSHEET Your dietician can use this worksheet to help you decide how many servings of foods and what types of foods are right for you.  BREAKFAST Food Group and Servings / Carb Servings Grain/Starches __________________________________ Dairy __________________________________________ Vegetable ______________________________________ Fruit ___________________________________________ Meat __________________________________________ Fat ____________________________________________ LUNCH Food Group and Servings / Carb Servings Grain/Starches ___________________________________ Dairy  ___________________________________________ Fruit ____________________________________________ Meat ___________________________________________ Fat _____________________________________________ Laural Golden Food Group and Servings / Carb Servings Grain/Starches ___________________________________ Dairy ___________________________________________ Fruit ____________________________________________ Meat ___________________________________________ Fat _____________________________________________ SNACKS Food Group and Servings / Carb Servings Grain/Starches ___________________________________ Dairy ___________________________________________ Vegetable _______________________________________ Fruit ____________________________________________ Meat ___________________________________________ Fat _____________________________________________ DAILY TOTALS Starches _________________________ Vegetable ________________________ Fruit ____________________________ Dairy ____________________________ Meat ____________________________ Fat ______________________________ Document Released: 11/28/2004 Document Revised: 05/26/2011 Document Reviewed: 10/09/2008 [employer] Patient Information 2014 [employer].

## 2012-10-29 NOTE — Progress Notes (Signed)
Patient ID: Ashley Long, female   DOB: 1960/11/25, 52 y.o.   MRN: [id number]   Subjective:   Patient ID: Ashley Long female   DOB: 1960/06/30 52 y.o.   MRN: [id number]  HPI: [name] is a 52 y.o. woman with history of DM2, HTN, HL who presents for routine check up.   With regard to her diabetes, pt's A1C is 7.5% today, down from 8.4% in 2/14; she was congratulated on this improvement!  Pt is compliant with her Lantus 22 units at night, 35 units in the morning as well as her metformin and glipizide.  She brought her glucometer into clinic today which showed well controlled blood sugars, averaging in 150s at breakfast and lunch, 170s at dinner.  She denies tremulousness, diaphoresis, confusion, blurred vision, polydipsia, polyuria.    In terms of her blood pressure, pt's BP in clinic today was 150/90.  She reports good compliance with her antihypertensives, including amlodipine, clonidine, labetolol, quinapril, HCTZ.  She checks her BPs at home on a portable cuff which she brought in today.  Her BP ranges from low 120s-low 150s systolic and was 782/95 this morning.   For health maintenance, pt states she saw ophthalmologist Dr. Dione Booze on 10/26/12, had a mammogram which was normal in 2/14.  She is due for screening colonoscopy.   Past Medical History  Diagnosis Date  . DIABETES MELLITUS, TYPE II 06/02/2006    Last HBA1C 8.7.   . HYPERLIPIDEMIA 06/02/2006    Qualifier: Diagnosis of  By: Long Gerold MD, Christiane Ha    . HYPERTENSION 06/02/2006    Filed Vitals:   01/21/11 1134  BP: 162/98  Pulse: 80  Temp: 97.4 F (36.3 C)  TempSrc: Oral  Resp: 20  Height: 5\' 8"  (1.727 m)  Weight: 261 lb (118.389 kg)      Current Outpatient Prescriptions  Medication Sig Dispense Refill  . amLODipine (NORVASC) 10 MG tablet Take 1 tablet (10 mg total) by mouth at bedtime.  30 tablet  11  . aspirin 81 MG EC tablet Take 81 mg by mouth daily.        . Blood Glucose Monitoring Suppl (WAVESENSE PRESTO) W/DEVICE  KIT 1 each by Does not apply route 2 (two) times daily.  1 each  0  . cloNIDine (CATAPRES) 0.3 MG tablet Take 1 tablet (0.3 mg total) by mouth 2 (two) times daily.  180 tablet  3  . glipiZIDE (GLUCOTROL) 10 MG tablet Take 1 tablet (10 mg total) by mouth 2 (two) times daily.  180 tablet  3  . insulin NPH-insulin regular (NOVOLIN 70/30) (70-30) 100 UNIT/ML injection 35 units in the AM and 22 units in the PM 15 minutes prior to food  10 mL  prn  . labetalol (NORMODYNE) 200 MG tablet Take 3 tablets (600 mg total) by mouth 2 (two) times daily.  540 tablet  2  . Lancets 30G MISC 1 each by Does not apply route 2 (two) times daily.  100 each  12  . metFORMIN (GLUCOPHAGE) 1000 MG tablet Take 1 tablet (1,000 mg total) by mouth 2 (two) times daily.  60 tablet  11  . Multiple Vitamins-Minerals (CENTRUM) tablet Take 1 tablet by mouth daily.        . quinapril-hydrochlorothiazide (ACCURETIC) 20-12.5 MG per tablet Take 2 tablets by mouth daily.  180 tablet  3  . simvastatin (ZOCOR) 40 MG tablet Take 1 tablet (40 mg total) by mouth every evening.  30 tablet  11  No current facility-administered medications for this visit.   Family History  Problem Relation Age of Onset  . Other Neg Hx    History   Social History  . Marital Status: Single    Spouse Name: N/A    Number of Children: N/A  . Years of Education: N/A   Social History Main Topics  . Smoking status: Former Smoker    Quit date: 10/15/1982  . Smokeless tobacco: None  . Alcohol Use: No  . Drug Use: No  . Sexual Activity: None   Other Topics Concern  . None   Social History Narrative   Financial assistance approved for 100% discount at [facility] and has [facility] card per [employer]   02/12/10         Review of Systems: Review of Systems  Constitutional: Negative for fever, chills and diaphoresis.  HENT: Negative for congestion.   Eyes: Negative for blurred vision.  Respiratory: Negative for cough and shortness of breath.     Cardiovascular: Negative for chest pain, palpitations and leg swelling.  Gastrointestinal: Negative for heartburn, nausea, vomiting, abdominal pain, diarrhea, constipation and blood in stool.  Genitourinary: Negative for dysuria.  Musculoskeletal: Negative for myalgias and falls.  Neurological: Negative for dizziness, loss of consciousness and headaches.  Endo/Heme/Allergies: Negative for polydipsia.    Objective:  Physical Exam: Filed Vitals:   10/29/12 1528  BP: 150/90  Pulse: 82  Height: 5\' 8"  (1.727 m)  Weight: 271 lb (122.925 kg)  SpO2: 100%   General: alert, cooperative, and in no apparent distress HEENT: vision grossly intact, oropharynx clear and non-erythematous  Neck: supple, no lymphadenopathy, JVD, or carotid bruits Lungs: clear to ascultation bilaterally, normal work of respiration, no wheezes, rales, ronchi Heart: regular rate and rhythm, no murmurs, gallops, or rubs Abdomen: soft, non-tender, non-distended, normal bowel sounds Extremities: chronic non-pitting bilateral lower extremity edema, no cyanosis or clubbing Neurologic: alert & oriented X3, cranial nerves II-XII intact, strength grossly intact, sensation intact to light touch  Assessment & Plan:  Patient discussed with Dr. Aundria Rud. Please see problem-based assessment and plan.

## 2012-10-29 NOTE — Progress Notes (Signed)
I saw and evaluated the patient.  I personally confirmed the key portions of the history and exam documented by Dr. Richie Vadala and I reviewed pertinent patient test results.  The assessment, diagnosis, and plan were formulated together and I agree with the documentation in the resident's note. 

## 2012-10-30 LAB — BASIC METABOLIC PANEL WITH GFR
BUN: 19 mg/dL (ref 6–23)
CO2: 25 mEq/L (ref 19–32)
Chloride: 104 mEq/L (ref 96–112)
GFR, Est African American: 69 mL/min
Glucose, Bld: 132 mg/dL — ABNORMAL HIGH (ref 70–99)
Potassium: 4.2 mEq/L (ref 3.5–5.3)
Sodium: 138 mEq/L (ref 135–145)

## 2012-10-31 NOTE — Assessment & Plan Note (Signed)
BP Readings from Last 3 Encounters:  10/29/12 150/90  04/26/12 130/78  02/09/12 148/82    Lab Results  Component Value Date   NA 138 10/29/2012   K 4.2 10/29/2012   CREATININE 1.08 10/29/2012    Assessment: Pt's BP controlled but on five agents.  Discussed medication compliance with all of these; will not change regimen for now but may be able to decrease clonidine dose in future.  Blood pressure control: controlled Progress toward BP goal:  at goal  Plan: Medications:  continue current medications- amlodipine, clonidine, labetolol, quinapril, HCTZ

## 2012-10-31 NOTE — Assessment & Plan Note (Signed)
Continue simvistatin. Pt to have FLP q5 years per Dr. Doreene Adas.

## 2012-10-31 NOTE — Assessment & Plan Note (Signed)
Lab Results  Component Value Date   HGBA1C 7.5 10/29/2012   HGBA1C 8.4 04/26/2012   HGBA1C 9.3 02/09/2012     Assessment: Pt congratulated on improvement in A1C! Diabetes control: good control (HgbA1C at goal) Progress toward A1C goal:  at goal  Plan: Medications:  continue current medications Home glucose monitoring: Frequency:  three times daily Timing:  before breakfast, before or after lunch and dinner Instruction/counseling given: reminded to bring medications to each visit Educational resources provided: brochure Self management tools provided: copy of home glucose meter download  Other plans: BMP today.  Pt will get urine microalbumin:creatinine ratio every two years, due summer 2015.

## 2012-10-31 NOTE — Assessment & Plan Note (Signed)
Pap smear in 11/13.  Mammogram in 2/14.  Ophthalmology exam in 8/14.  Will schedule screening colonoscopy in 10/14 as access may be easier at that time.

## 2012-11-08 ENCOUNTER — Other Ambulatory Visit: Payer: Self-pay | Admitting: *Deleted

## 2012-11-08 DIAGNOSIS — E119 Type 2 diabetes mellitus without complications: Secondary | ICD-10-CM

## 2012-11-09 MED ORDER — INSULIN NPH ISOPHANE & REGULAR (70-30) 100 UNIT/ML ~~LOC~~ SUSP: SUBCUTANEOUS | Status: DC

## 2012-11-09 NOTE — Telephone Encounter (Signed)
Rx faxed in.

## 2012-11-12 ENCOUNTER — Other Ambulatory Visit: Payer: Self-pay | Admitting: *Deleted

## 2012-11-12 ENCOUNTER — Other Ambulatory Visit: Payer: Self-pay | Admitting: Internal Medicine

## 2012-11-12 DIAGNOSIS — E119 Type 2 diabetes mellitus without complications: Secondary | ICD-10-CM

## 2012-11-12 MED ORDER — INSULIN NPH ISOPHANE & REGULAR (70-30) 100 UNIT/ML ~~LOC~~ SUSP: SUBCUTANEOUS | Status: DC

## 2012-11-12 NOTE — Telephone Encounter (Signed)
Called to [facility] 

## 2012-11-12 NOTE — Telephone Encounter (Signed)
Pt is out of meds and mail order will not arrive for another week.  Please refill for 1 vial and I will call into [facility] for pt.

## 2012-12-15 ENCOUNTER — Other Ambulatory Visit: Payer: Self-pay | Admitting: Internal Medicine

## 2012-12-15 DIAGNOSIS — Z299 Encounter for prophylactic measures, unspecified: Secondary | ICD-10-CM

## 2012-12-22 ENCOUNTER — Ambulatory Visit (INDEPENDENT_AMBULATORY_CARE_PROVIDER_SITE_OTHER): Payer: No Typology Code available for payment source | Admitting: *Deleted

## 2012-12-22 DIAGNOSIS — Z23 Encounter for immunization: Secondary | ICD-10-CM

## 2012-12-29 ENCOUNTER — Ambulatory Visit: Payer: No Typology Code available for payment source

## 2013-03-01 ENCOUNTER — Ambulatory Visit: Payer: Self-pay

## 2013-03-08 ENCOUNTER — Ambulatory Visit: Payer: No Typology Code available for payment source

## 2013-03-29 ENCOUNTER — Ambulatory Visit (HOSPITAL_COMMUNITY)
Admission: AD | Admit: 2013-03-29 | Discharge: 2013-03-29 | Disposition: A | Discharge status: Home / Self Care | Payer: No Typology Code available for payment source | Source: Ambulatory Visit | Attending: Gastroenterology | Admitting: Gastroenterology

## 2013-03-29 ENCOUNTER — Encounter (HOSPITAL_COMMUNITY): Payer: Self-pay | Admitting: Gastroenterology

## 2013-03-29 ENCOUNTER — Encounter (HOSPITAL_COMMUNITY): Admission: RE | Payer: Self-pay | Source: Ambulatory Visit

## 2013-03-29 ENCOUNTER — Encounter (HOSPITAL_COMMUNITY)
Admission: AD | Disposition: A | Discharge status: Home / Self Care | Payer: Self-pay | Source: Ambulatory Visit | Attending: Gastroenterology

## 2013-03-29 ENCOUNTER — Ambulatory Visit (HOSPITAL_COMMUNITY)
Admission: RE | Admit: 2013-03-29 | Payer: No Typology Code available for payment source | Source: Ambulatory Visit | Admitting: Gastroenterology

## 2013-03-29 DIAGNOSIS — D126 Benign neoplasm of colon, unspecified: Secondary | ICD-10-CM | POA: Insufficient documentation

## 2013-03-29 HISTORY — PX: COLONOSCOPY: SHX5424

## 2013-03-29 SURGERY — COLONOSCOPY
Anesthesia: Moderate Sedation

## 2013-03-29 MED ORDER — FENTANYL CITRATE 0.05 MG/ML IJ SOLN
INTRAMUSCULAR | Status: AC
  Filled 2013-03-29: qty 2

## 2013-03-29 MED ORDER — DIPHENHYDRAMINE HCL 50 MG/ML IJ SOLN
INTRAMUSCULAR | Status: AC
  Filled 2013-03-29: qty 1

## 2013-03-29 MED ORDER — MIDAZOLAM HCL 5 MG/ML IJ SOLN
INTRAMUSCULAR | Status: AC
  Filled 2013-03-29: qty 2

## 2013-03-29 MED ORDER — FENTANYL CITRATE 0.05 MG/ML IJ SOLN
INTRAMUSCULAR | Status: DC | PRN
  Administered 2013-03-29 (×3): 25 ug via INTRAVENOUS

## 2013-03-29 MED ORDER — SODIUM CHLORIDE 0.9 % IV SOLN: INTRAVENOUS | Status: DC

## 2013-03-29 MED ORDER — MIDAZOLAM HCL 5 MG/5ML IJ SOLN
INTRAMUSCULAR | Status: DC | PRN
  Administered 2013-03-29: 1 mg via INTRAVENOUS
  Administered 2013-03-29 (×3): 2 mg via INTRAVENOUS
  Administered 2013-03-29: 1 mg via INTRAVENOUS

## 2013-03-29 NOTE — Interval H&P Note (Signed)
History and Physical Interval Note:  03/29/2013 8:21 AM  Ashley Long  has presented today for surgery, with the diagnosis of blood in stool  The various methods of treatment have been discussed with the patient and family. After consideration of risks, benefits and other options for treatment, the patient has consented to  Procedure(s): COLONOSCOPY (N/A) as a surgical intervention .  The patient's history has been reviewed, patient examined, no change in status, stable for surgery.  I have reviewed the patient's chart and labs.  Questions were answered to the patient's satisfaction.     [place] C.

## 2013-03-29 NOTE — H&P (Signed)
  Date of Initial H&P: 03/25/13  History reviewed, patient examined, no change in status, stable for surgery. 

## 2013-03-29 NOTE — Discharge Instructions (Addendum)
Avoid all aspirin and ibuprofen products for the next 2 weeks. Tylenol as needed ok to take.  Moderate Sedation, Adult Moderate sedation is given to help you relax or even sleep through a procedure. You may remain sleepy, be clumsy, or have poor balance for several hours following this procedure. Arrange for a responsible adult, family member, or friend to take you home. A responsible adult should stay with you for at least 24 hours or until the medicines have worn off.  Do not participate in any activities where you could become injured for the next 24 hours, or until you feel normal again. Do not:  Drive.  Swim.  Ride a bicycle.  Operate heavy machinery.  Cook.  Use power tools.  Climb ladders.  Work at [employer].  Do not make important decisions or sign legal documents until you are improved.  Vomiting may occur if you eat too soon. When you can drink without vomiting, try water, juice, or soup. Try solid foods if you feel little or no nausea.  Only take over-the-counter or prescription medications for pain, discomfort, or fever as directed by your caregiver.If pain medications have been prescribed for you, ask your caregiver how soon it is safe to take them.  Make sure you and your family fully understands everything about the medication given to you. Make sure you understand what side effects may occur.  You should not drink alcohol, take sleeping pills, or medications that cause drowsiness for at least 24 hours.  If you smoke, do not smoke alone.  If you are feeling better, you may resume normal activities 24 hours after receiving sedation.  Keep all appointments as scheduled. Follow all instructions.  Ask questions if you do not understand. SEEK MEDICAL CARE IF:   Your skin is pale or bluish in color.  You continue to feel sick to your stomach (nauseous) or throw up (vomit).  Your pain is getting worse and not helped by medication.  You have bleeding or  swelling.  You are still sleepy or feeling clumsy after 24 hours. SEEK IMMEDIATE MEDICAL CARE IF:   You develop a rash.  You have difficulty breathing.  You develop any type of allergic problem.  You have a fever. Document Released: 11/26/2000 Document Revised: 05/26/2011 Document Reviewed: 11/08/2012 [facility] Patient Information 2014 [place].

## 2013-03-30 ENCOUNTER — Encounter (HOSPITAL_COMMUNITY): Payer: Self-pay | Admitting: Gastroenterology

## 2013-03-30 ENCOUNTER — Other Ambulatory Visit: Payer: Self-pay | Admitting: Internal Medicine

## 2013-03-30 ENCOUNTER — Telehealth: Payer: Self-pay | Admitting: *Deleted

## 2013-03-30 MED ORDER — CARVEDILOL 25 MG PO TABS: 25.0000 mg | ORAL_TABLET | Freq: Two times a day (BID) | ORAL | Status: DC

## 2013-03-30 NOTE — Progress Notes (Signed)
Notified by [facility] that labetalol is no longer available and should consider changing to carvedilol.  Spoke to pharmacy who recommends starting carvedilol at dose of 25 mg BID then follow-up in 2-3 weeks so patient will need [facility] appointment at that time.  Once carvedilol dosing solidified, can go back to 3 month prescriptions.

## 2013-03-30 NOTE — Op Note (Signed)
[facility] [place], [address]   COLONOSCOPY PROCEDURE REPORT  PATIENT: Ashley, Long  MR#: [id number] BIRTHDATE: 08/04/1960 , 52  yrs. old GENDER: Female ENDOSCOPIST: Wilford Corner, MD REFERRED EG:BTDVVOHY Eppie Gibson, M.D. PROCEDURE DATE:  03/29/2013 PROCEDURE:   Colonoscopy with snare polypectomy ASA CLASS:   Class III INDICATIONS:hematochezia. MEDICATIONS: Fentanyl 75 mcg IV and Versed 8 mg IV  DESCRIPTION OF PROCEDURE:   After the risks benefits and alternatives of the procedure were thoroughly explained, informed consent was obtained.  The Pentax Ped Colon [id number]  endoscope was introduced through the anus and advanced to the cecum, which was identified by both the appendix and ileocecal valve , limited by No adverse events experienced.   The quality of the prep was good. . The instrument was then slowly withdrawn as the colon was fully examined.     FINDINGS:  Rectal exam unremarkable.  Pediatric colonoscope inserted into the colon and advanced to the cecum, where the appendiceal orifice and ileocecal valve were identified. The terminal ileum was intubated and was normal in appearance. On insertion an 70mm sessile polyp was removed with snare cautery in the descending colon. On careful withdrawal of the colonoscope the polypectomy site was again noted in the descending colon without any bleeding seen. A 5 mm flat polyp was noted in the rectum and removed with a cold biopsy forceps. Retroflexion revealed medium-sized internal hemorrhoids. The mucosa in part of the distal rectum was nodular with normal-colored mucosa and was biopsied for histologic purposes.  COMPLICATIONS: None  IMPRESSION:     Colon polyps X 2 - see above for details Internal hemorrhoids (previous bleeding likely due to hemorrhoids) Nodular rectal mucosa - s/p biopsies  RECOMMENDATIONS: F/U on path    ______________________________ eSignedWilford Corner, MD 03/29/2013 10:17 AM   CC:

## 2013-03-30 NOTE — Telephone Encounter (Signed)
Fax from [facility] -  States Labetalol is no longer available; please consider changing to Carvedilol IR. Thanks

## 2013-03-30 NOTE — Telephone Encounter (Signed)
See Dr Stann Mainland order encounter. Labetalol was changed to Carvedilol 25mg  BID - pt was called and informed. Pt already has an appt w/Dr Stann Mainland 04/18/13.

## 2013-04-18 ENCOUNTER — Encounter: Payer: Self-pay | Admitting: Internal Medicine

## 2013-04-18 ENCOUNTER — Ambulatory Visit (INDEPENDENT_AMBULATORY_CARE_PROVIDER_SITE_OTHER): Payer: No Typology Code available for payment source | Admitting: Internal Medicine

## 2013-04-18 VITALS — BP 140/70 | HR 79 | Temp 98.7°F | Ht 68.0 in | Wt 261.3 lb

## 2013-04-18 DIAGNOSIS — Z299 Encounter for prophylactic measures, unspecified: Secondary | ICD-10-CM

## 2013-04-18 DIAGNOSIS — I10 Essential (primary) hypertension: Secondary | ICD-10-CM

## 2013-04-18 DIAGNOSIS — E119 Type 2 diabetes mellitus without complications: Secondary | ICD-10-CM

## 2013-04-18 DIAGNOSIS — E785 Hyperlipidemia, unspecified: Secondary | ICD-10-CM

## 2013-04-18 DIAGNOSIS — E669 Obesity, unspecified: Secondary | ICD-10-CM

## 2013-04-18 LAB — POCT GLYCOSYLATED HEMOGLOBIN (HGB A1C): HEMOGLOBIN A1C: 8.7

## 2013-04-18 LAB — GLUCOSE, CAPILLARY: GLUCOSE-CAPILLARY: 192 mg/dL — AB (ref 70–99)

## 2013-04-18 MED ORDER — INSULIN NPH ISOPHANE & REGULAR (70-30) 100 UNIT/ML ~~LOC~~ SUSP: SUBCUTANEOUS | Status: DC

## 2013-04-18 MED ORDER — INSULIN NPH ISOPHANE & REGULAR (70-30) 100 UNIT/ML ~~LOC~~ SUSP: 22.0000 [IU] | Freq: Two times a day (BID) | SUBCUTANEOUS | Status: DC

## 2013-04-18 NOTE — Assessment & Plan Note (Signed)
Continue simvastatin. 

## 2013-04-18 NOTE — Assessment & Plan Note (Signed)
Patient has lost 10 pounds in last 6 months, congratulated on this accomplishment.

## 2013-04-18 NOTE — Patient Instructions (Addendum)
Follow-up with Dr. Stann Mainland in 1 month and [facility] as soon as she has an appointment.   Keep up the good work taking all of your medicines as prescribed!  We are going to INCREASE your insulin today to 38 units in the morning (instead of 35). Please let us know if you begin experiencing episodes of low blood sugar (more information below).  We are also referring you to our diabetes coordinator who can give you more information about diabetic diet and exercise.   Diabetes, Eating Away From Home Sometimes, you might eat in a restaurant or have meals that are prepared by someone else. You can enjoy eating out. However, the portions in restaurants may be much larger than needed. Listed below are some ideas to help you choose foods that will keep your blood glucose (sugar) in better control.  TIPS FOR EATING OUT  Know your meal plan and how many carbohydrate servings you should have at each meal. You may wish to carry a copy of your meal plan in your purse or wallet. Learn the foods included in each food group.  Make a list of restaurants near you that offer healthy choices. Take a copy of the carry-out menus to see what they offer. Then, you can plan what you will order ahead of time.  Become familiar with serving sizes by practicing them at home using measuring cups and spoons. Once you learn to recognize portion sizes, you will be able to correctly estimate the amount of total carbohydrate you are allowed to eat at the restaurant. Ask for a takeout box if the portion is more than you should have. When your food comes, leave the amount you should have on the plate, and put the rest in the takeout box before you start eating.  Plan ahead if your mealtime will be different from usual. Check with your caregiver to find out how to time meals and medicine if you are taking insulin.  Avoid high-fat foods, such as fried foods, cream sauces, high-fat salad dressings, or any added butter or margarine.  Do  not be afraid to ask questions. Ask your server about the portion size, cooking methods, ingredients and if items can be substituted. Restaurants do not list all available items on the menu. You can ask for your main entree to be prepared using skim milk, oil instead of butter or margarine, and without gravy or sauces. Ask your waiter or waitress to serve salad dressings, gravy, sauces, margarine, and sour cream on the side. You can then add the amount your meal plan suggests.  Add more vegetables whenever possible.  Avoid items that are labeled "jumbo," "giant," "deluxe," or "supersized."  You may want to split an entre with someone and order an extra side salad.  Watch for hidden calories in foods like croutons, bacon, or cheese.  Ask your server to take away the bread basket or chips from your table.  Order a dinner salad as an appetizer. You can eat most foods served in a restaurant. Some foods are better choices than others. Breads and Starches  Recommended: All kinds of bread (wheat, rye, white, oatmeal, [place], [place], raisin), hard or soft dinner rolls, frankfurter or hamburger buns, small bagels, small corn or whole-wheat flour tortillas.  Avoid: Frosted or glazed breads, butter rolls, egg or cheese breads, croissants, sweet rolls, pastries, coffee cake, glazed or frosted doughnuts, muffins. Crackers  Recommended: Animal crackers, graham, rye, saltine, oyster, and matzoth crackers. Bread sticks, melba toast, rusks, pretzels,  popcorn (without fat), zwieback toast.  Avoid: High-fat snack crackers or chips. Buttered popcorn. Cereals  Recommended: Hot and cold cereals. Whole grains such as oatmeal or shredded wheat are good choices.  Avoid: Sugar-coated or granola type cereals. Potatoes/Pasta/Rice/Beans  Recommended: Order baked, boiled, or mashed potatoes, rice or noodles without added fat, whole beans. Order gravies, butter, margarine, or sauces on the side so you can control  the amount you add.  Avoid: Hash browns or fried potatoes. Potatoes, pasta, or rice prepared with cream or cheese sauce. Potato or pasta salads prepared with large amounts of dressing. Fried beans or fried rice. Vegetables  Recommended: Order steamed, baked, boiled, or stewed vegetables without sauces or extra fat. Ask that sauce be served on the side. If vegetables are not listed on the menu, ask what is available.  Avoid: Vegetables prepared with cream, butter, or cheese sauce. Fried vegetables. Salad Bars  Recommended: Many of the vegetables at a salad bar are considered "free." Use lemon juice, vinegar, or low-calorie salad dressing (fewer than 20 calories per serving) as "free" dressings for your salad. Look for salad bar ingredients that have no added fat or sugar such as tomatoes, lettuce, cucumbers, broccoli, carrots, onions, and mushrooms.  Avoid: Prepared salads with large amounts of dressing, such as coleslaw, caesar salad, macaroni salad, bean salad, or carrot salad. Fruit  Recommended: Eat fresh fruit or fresh fruit salad without added dressing. A salad bar often offers fresh fruit choices, but canned fruit at a restaurant is usually packed in sugar or syrup.  Avoid: Sweetened canned or frozen fruits, plain or sweetened fruit juice. Fruit salads with dressing, sour cream, or sugar added to them. Meat and Meat Substitutes  Recommended: Order broiled, baked, roasted, or grilled meat, poultry, or fish. Trim off all visible fat. Do not eat the skin of poultry. The size stated on the menu is the raw weight. Meat shrinks by  in cooking (for example, 4 oz raw equals 3 oz cooked meat).  Avoid: Deep-fat fried meat, poultry, or fish. Breaded meats. Eggs  Recommended: Order soft, hard-cooked, poached, or scrambled eggs. Omelets may be okay, depending on what ingredients are added. Egg substitutes are also a good choice.  Avoid: Fried eggs, eggs prepared with cream or cheese  sauce. Milk  Recommended: Order low-fat or fat-free milk according to your meal plan. Plain, nonfat yogurt or flavored yogurt with no sugar added may be used as a substitute for milk. Soy milk may also be used.  Avoid: Milk shakes or sweetened milk beverages. Soups and Combination Foods  Recommended: Clear broth or consomm are "free" foods and may be used as an appetizer. Broth-based soups with fat removed count as a starch serving and are preferred over cream soups. Soups made with beans or split peas may be eaten but count as a starch.  Avoid: Fatty soups, soup made with cream, cheese soup. Combination foods prepared with excessive amounts of fat or with cream or cheese sauces. Desserts and Sweets  Recommended: Ask for fresh fruit. Sponge or angel food cake without icing, ice milk, no sugar added ice cream, sherbet, or frozen yogurt may fit into your meal plan occasionally.  Avoid: Pastries, puddings, pies, cakes with icing, custard, gelatin desserts. Fats and Oils  Recommended: Choose healthy fats such as olive oil, canola oil, or tub margarine, reduced fat or fat-free sour cream, cream cheese, avocado, or nuts.  Avoid: Any fats in excess of your allowed portion. Deep-fried foods or any food with  a large amount of fat. Note: Ask for all fats to be served on the side, and limit your portion sizes according to your meal plan. Document Released: 03/03/2005 Document Revised: 05/26/2011 Document Reviewed: 09/21/2008 [facility] Patient Information 2014 [place], [place].  Low Blood Sugar Low blood sugar (hypoglycemia) means that the level of sugar in your blood is lower than it should be. Signs of low blood sugar include:  Getting sweaty.  Feeling hungry.  Feeling dizzy or weak.  Feeling sleepier than normal.  Feeling nervous.  Headaches.  Having a fast heartbeat. Low blood sugar can happen fast and can be an emergency. Your doctor can do tests to check your blood sugar level. You  can have low blood sugar and not have diabetes. HOME CARE  Check your blood sugar as told by your doctor. If it is less than 70 mg/dl or as told by your doctor, take 1 of the following:  3 to 4 glucose tablets.   cup clear juice.   cup soda pop, not diet.  1 cup milk.  5 to 6 hard candies.  Recheck blood sugar after 15 minutes. Repeat until it is at the right level.  Eat a snack if it is more than 1 hour until the next meal.  Only take medicine as told by your doctor.  Do not skip meals. Eat on time.  Do not drink alcohol except with meals.  Check your blood glucose before driving.  Check your blood glucose before and after exercise.  Always carry treatment with you, such as glucose pills.  Always wear a medical alert bracelet if you have diabetes. GET HELP RIGHT AWAY IF:   Your blood glucose goes below 70 mg/dl or as told by your doctor, and you:  Are confused.  Are not able to swallow.  Pass out (faint).  You cannot treat yourself. You may need someone to help you.  You have low blood sugar problems often.  You have problems from your medicines.  You are not feeling better after 3 to 4 days.  You have vision changes. MAKE SURE YOU:   Understand these instructions.  Will watch this condition.  Will get help right away if you are not doing well or get worse. Document Released: 05/28/2009 Document Revised: 05/26/2011 Document Reviewed: 05/28/2009 [facility] Patient Information 2014 [place], [place].

## 2013-04-18 NOTE — Assessment & Plan Note (Addendum)
Lab Results  Component Value Date   HGBA1C 8.7 04/18/2013   HGBA1C 7.5 10/29/2012   HGBA1C 8.4 04/26/2012     Assessment: Diabetes control:  uncontrolled Progress toward A1C goal:   deteriorated  Plan: Medications:  continue current medications but increase 70/30 insulin to 38 units in AM (continue 22 units in PM, metformin, glipizide) Home glucose monitoring: Frequency:  3 times daily Timing:  with meals Instruction/counseling given: reminded to bring blood glucose meter & log to each visit  Other plans: Patient to meet with [name] as soon as possible.

## 2013-04-18 NOTE — Assessment & Plan Note (Signed)
BP controlled at 140/70 today on 5 agents (amlodipine, clonidine, carvedilol, quinapril, HCTZ).  Continue current regimen.

## 2013-04-18 NOTE — Assessment & Plan Note (Signed)
Colonoscopy on 03/29/13- tubular adenoma in descending colon, hyperplastic polyp in rectum (both without high grade dysplasia or malignancy).  Repeat colonoscopy in 2020.

## 2013-04-18 NOTE — Progress Notes (Signed)
Patient ID: Ashley Long, female   DOB: 1960-06-25, 53 y.o.   MRN: [id number]   Subjective:   Patient ID: Ashley Long female   DOB: 04-16-1960 53 y.o.   MRN: [id number]  HPI: [name] is a 53 y.o. woman with history of DM2, HTN, HL who presents for routine follow-up.   Patient states she is doing well, no complaints.  She has been working on diet and exercise and has lost 10 pounds in past 6 months (documented).  Reports good medication compliance.  Unfortunately, her A1C is 8.7% today (up from 7.5% in 10/2012).  She has had a couple of episodes of hypoglycemia, knows how it feels so checks her blood sugar then drinks juice to feel better.  She brought her glucometer today which showed a single pre-breakfast episode of hypoglycemia to 52 and a single pre-dinner episode of hypoglycemia to 63.  She did have several episodes of hyperglycemia to low to high 300s (asymptomatic).  She is willing to meet with [name] to discuss diabetic diet.   BP 140/70 today, at goal.  Patient was recently switched from labetalol to carvedilol 25 mg BID due to insurance formulary change.  No problems with this medication.    Past Medical History  Diagnosis Date  . DIABETES MELLITUS, TYPE II 06/02/2006    Last HBA1C 8.7.   . HYPERLIPIDEMIA 06/02/2006    Qualifier: Diagnosis of  By: Patrice Paradise MD, Roderic Palau    . HYPERTENSION 06/02/2006    Filed Vitals:   01/21/11 1134  BP: 162/98  Pulse: 80  Temp: 97.4 F (36.3 C)  TempSrc: Oral  Resp: 20  Height: 5' 8" (1.727 m)  Weight: 261 lb (118.389 kg)      Current Outpatient Prescriptions  Medication Sig Dispense Refill  . amLODipine (NORVASC) 10 MG tablet Take 1 tablet (10 mg total) by mouth at bedtime.  30 tablet  11  . aspirin 81 MG EC tablet Take 81 mg by mouth daily.        . Blood Glucose Monitoring Suppl (WAVESENSE PRESTO) W/DEVICE KIT 1 each by Does not apply route 2 (two) times daily.  1 each  0  . carvedilol (COREG) 25 MG tablet Take 1 tablet (25 mg  total) by mouth 2 (two) times daily.  60 tablet  0  . cloNIDine (CATAPRES) 0.3 MG tablet Take 1 tablet (0.3 mg total) by mouth 2 (two) times daily.  180 tablet  3  . glipiZIDE (GLUCOTROL) 10 MG tablet Take 1 tablet (10 mg total) by mouth 2 (two) times daily.  180 tablet  3  . insulin NPH-regular Human (NOVOLIN 70/30) (70-30) 100 UNIT/ML injection 38 units in the AM and 22 units in the PM 15 minutes prior to food  20 mL  prn  . Lancets 30G MISC 1 each by Does not apply route 2 (two) times daily.  100 each  12  . metFORMIN (GLUCOPHAGE) 1000 MG tablet Take 1 tablet (1,000 mg total) by mouth 2 (two) times daily.  60 tablet  11  . Multiple Vitamins-Minerals (CENTRUM) tablet Take 1 tablet by mouth daily.        . quinapril-hydrochlorothiazide (ACCURETIC) 20-12.5 MG per tablet Take 2 tablets by mouth daily.  180 tablet  3  . simvastatin (ZOCOR) 40 MG tablet Take 1 tablet (40 mg total) by mouth every evening.  30 tablet  11   No current facility-administered medications for this visit.   Family History  Problem Relation Age  of Onset  . Other Neg Hx    History   Social History  . Marital Status: Single    Spouse Name: N/A    Number of Children: N/A  . Years of Education: N/A   Social History Main Topics  . Smoking status: Former Smoker    Quit date: 10/15/1982  . Smokeless tobacco: Not on file  . Alcohol Use: No  . Drug Use: No  . Sexual Activity: Not on file   Other Topics Concern  . Not on file   Social History Narrative   Financial assistance approved for 100% discount at [facility] and has [facility] card per Bonna Gains   02/12/10         Review of Systems: Review of Systems  Constitutional: Positive for weight loss. Negative for fever and chills.       10 pounds since 10/2012  Eyes: Negative for blurred vision.  Respiratory: Negative for cough and shortness of breath.   Cardiovascular: Negative for chest pain, palpitations and leg swelling.  Gastrointestinal: Negative for nausea,  vomiting, abdominal pain, diarrhea, constipation and blood in stool.  Genitourinary: Negative for dysuria.  Musculoskeletal: Negative for falls.  Neurological: Negative for dizziness, loss of consciousness, weakness and headaches.    Objective:  Physical Exam: Filed Vitals:   04/18/13 1015  BP: 140/70  Pulse: 79  Temp: 98.7 F (37.1 C)  TempSrc: Oral  Height: 5' 8" (1.727 m)  Weight: 261 lb 4.8 oz (118.525 kg)  SpO2: 98%   General: alert, cooperative, and in no apparent distress HEENT: NCAT, vision grossly intact, oropharynx clear and non-erythematous  Neck: supple, no lymphadenopathy Lungs: clear to ascultation bilaterally, normal work of respiration, no wheezes, rales, ronchi Heart: regular rate and rhythm, no murmurs, gallops, or rubs Abdomen: soft, non-tender, non-distended, normal bowel sounds Extremities: 2+ DP/PT pulses bilaterally, no cyanosis, clubbing, or edema Neurologic: alert & oriented X3, cranial nerves II-XII intact, strength grossly intact, sensation intact to light touch  Assessment & Plan:  Patient discussed with Dr. Murlean Caller.  Please see problem-based assessment and plan.

## 2013-04-20 NOTE — Progress Notes (Signed)
INTERNAL MEDICINE TEACHING ATTENDING ADDENDUM - Karrah Mangini, DO: I reviewed and discussed at the time of visit with the resident Dr. Rogers, the patient's medical history, physical examination, diagnosis and results of tests and treatment and I agree with the patient's care as documented.  

## 2013-04-25 ENCOUNTER — Other Ambulatory Visit: Payer: Self-pay | Admitting: Internal Medicine

## 2013-04-27 ENCOUNTER — Other Ambulatory Visit: Payer: Self-pay | Admitting: *Deleted

## 2013-04-27 MED ORDER — CARVEDILOL 25 MG PO TABS: 25.0000 mg | ORAL_TABLET | Freq: Two times a day (BID) | ORAL | Status: DC

## 2013-05-03 ENCOUNTER — Other Ambulatory Visit: Payer: Self-pay | Admitting: Internal Medicine

## 2013-05-03 ENCOUNTER — Encounter: Payer: No Typology Code available for payment source | Admitting: Dietician

## 2013-05-03 DIAGNOSIS — Z1231 Encounter for screening mammogram for malignant neoplasm of breast: Secondary | ICD-10-CM

## 2013-05-19 ENCOUNTER — Ambulatory Visit (INDEPENDENT_AMBULATORY_CARE_PROVIDER_SITE_OTHER): Payer: No Typology Code available for payment source | Admitting: Internal Medicine

## 2013-05-19 VITALS — BP 140/82 | HR 91 | Temp 98.2°F | Wt 265.8 lb

## 2013-05-19 DIAGNOSIS — E119 Type 2 diabetes mellitus without complications: Secondary | ICD-10-CM

## 2013-05-19 DIAGNOSIS — I10 Essential (primary) hypertension: Secondary | ICD-10-CM

## 2013-05-19 DIAGNOSIS — E785 Hyperlipidemia, unspecified: Secondary | ICD-10-CM

## 2013-05-19 LAB — LIPID PANEL
CHOLESTEROL: 133 mg/dL (ref 0–200)
HDL: 50 mg/dL (ref >39)
LDL Cholesterol: 59 mg/dL (ref 0–99)
Total CHOL/HDL Ratio: 2.7 Ratio
Triglycerides: 120 mg/dL (ref <150)
VLDL: 24 mg/dL (ref 0–40)

## 2013-05-19 MED ORDER — CLONIDINE HCL 0.3 MG PO TABS: 0.3000 mg | ORAL_TABLET | Freq: Two times a day (BID) | ORAL | Status: DC

## 2013-05-19 MED ORDER — METFORMIN HCL 1000 MG PO TABS: 1000.0000 mg | ORAL_TABLET | Freq: Two times a day (BID) | ORAL | Status: DC

## 2013-05-19 MED ORDER — QUINAPRIL-HYDROCHLOROTHIAZIDE 20-12.5 MG PO TABS: 2.0000 | ORAL_TABLET | Freq: Every day | ORAL | Status: DC

## 2013-05-19 NOTE — Assessment & Plan Note (Signed)
Instructed patient to decrease 70/30 insulin back to 35 units in AM, continue 22 units in PM along with oral agents.  She will continue to work on diet and exercise.  Reminded her to bring log in to next visit.  She will let us know if she continues to have low blood sugars.  Follow-up in 3 months.

## 2013-05-19 NOTE — Progress Notes (Signed)
Patient ID: Ashley Long, female   DOB: 1960/04/03, 53 y.o.   MRN: [id number]   Subjective:   Patient ID: Ashley Long female   DOB: [birth year] 53 y.o.   MRN: [id number]  HPI: [name] is a 53 y.o. woman with history of DM2 (A1C 8.7% in 04/2013), HTN, HL who presents for 1 month diabetes recheck.   Patient states since increasing her 70/30 insulin to 38 units in AM (from 35), she has been having low blood sugar in daytime.  Of note, she had one pre-dinner blood sugar of 49.  No symptoms of hypoglycemia, she would not have known blood sugar was low if she had not checked.  No recent change in diet or other medications.  No episodes or symptoms of hyperglycemia.    BP 140/82 today.  Patient brought her home BP cuff to clinic which has been showing higher readings, as high as 786L systolic, which has concerned her.  She checked her BP on home cuff in office just after above reading taken on clinic BP cuff, showed 173/92.  Explained to patient that her home cuff likely reading higher than her actual BP so she will need to get a new cuff, and she is willing to do so.    Patient would like to have her cholesterol checked today.   Otherwise, no complaints, negative ROS.   Past Medical History  Diagnosis Date  . DIABETES MELLITUS, TYPE II 06/02/2006    Last HBA1C 8.7.   . HYPERLIPIDEMIA 06/02/2006    Qualifier: Diagnosis of  By: Patrice Paradise MD, Roderic Palau    . HYPERTENSION 06/02/2006    Filed Vitals:   01/21/11 1134  BP: 162/98  Pulse: 80  Temp: 97.4 F (36.3 C)  TempSrc: Oral  Resp: 20  Height: '5\' 8"'  (1.727 m)  Weight: 261 lb (118.389 kg)      Current Outpatient Prescriptions  Medication Sig Dispense Refill  . amLODipine (NORVASC) 10 MG tablet Take 1 tablet (10 mg total) by mouth at bedtime.  30 tablet  11  . aspirin 81 MG EC tablet Take 81 mg by mouth daily.        . Blood Glucose Monitoring Suppl (WAVESENSE PRESTO) W/DEVICE KIT 1 each by Does not apply route 2 (two) times daily.  1 each   0  . carvedilol (COREG) 25 MG tablet Take 1 tablet (25 mg total) by mouth 2 (two) times daily.  60 tablet  2  . cloNIDine (CATAPRES) 0.3 MG tablet Take 1 tablet (0.3 mg total) by mouth 2 (two) times daily.  180 tablet  1  . glipiZIDE (GLUCOTROL) 10 MG tablet Take 1 tablet (10 mg total) by mouth 2 (two) times daily.  180 tablet  3  . insulin NPH-regular Human (NOVOLIN 70/30) (70-30) 100 UNIT/ML injection 38 units in the AM and 22 units in the PM 15 minutes prior to food  20 mL  prn  . Lancets 30G MISC 1 each by Does not apply route 2 (two) times daily.  100 each  12  . metFORMIN (GLUCOPHAGE) 1000 MG tablet Take 1 tablet (1,000 mg total) by mouth 2 (two) times daily.  180 tablet  1  . Multiple Vitamins-Minerals (CENTRUM) tablet Take 1 tablet by mouth daily.        . quinapril-hydrochlorothiazide (ACCURETIC) 20-12.5 MG per tablet Take 2 tablets by mouth daily.  180 tablet  1  . simvastatin (ZOCOR) 40 MG tablet Take 1 tablet (40 mg total) by mouth  every evening.  30 tablet  11   No current facility-administered medications for this visit.   Family History  Problem Relation Age of Onset  . Other Neg Hx    History   Social History  . Marital Status: Single    Spouse Name: N/A    Number of Children: N/A  . Years of Education: N/A   Social History Main Topics  . Smoking status: Former Smoker    Quit date: 10/15/1982  . Smokeless tobacco: Not on file  . Alcohol Use: No  . Drug Use: No  . Sexual Activity: Not on file   Other Topics Concern  . Not on file   Social History Narrative   Financial assistance approved for 100% discount at [facility] and has [facility] card per Bonna Gains   02/12/10         Review of Systems: Review of Systems  Constitutional: Negative for fever.  Eyes: Negative for blurred vision.  Respiratory: Negative for cough and shortness of breath.   Cardiovascular: Negative for chest pain.  Gastrointestinal: Negative for nausea, vomiting and abdominal pain.    Genitourinary: Negative for dysuria.  Musculoskeletal: Negative for falls.  Neurological: Negative for dizziness, loss of consciousness, weakness and headaches.    Objective:  Physical Exam: Filed Vitals:   05/19/13 1739  BP: 140/82  Pulse: 91  Temp: 98.2 F (36.8 C)  Weight: 265 lb 12.8 oz (120.566 kg)  SpO2: 99%   General: alert, cooperative, and in no apparent distress HEENT: NCAT, vision grossly intact, oropharynx clear and non-erythematous  Neck: supple, no lymphadenopathy Lungs: clear to ascultation bilaterally, normal work of respiration, no wheezes, rales, ronchi Heart: regular rate and rhythm, no murmurs, gallops, or rubs Abdomen: soft, non-tender, non-distended, normal bowel sounds Extremities: 2+ DP/PT pulses bilaterally, no cyanosis, clubbing, or edema Neurologic: alert & oriented X3, cranial nerves II-XII intact, strength grossly intact, sensation intact to light touch  Assessment & Plan:  Patient discussed with Dr. Ellwood Dense.  Please see problem-based assessment and plan.

## 2013-05-19 NOTE — Assessment & Plan Note (Signed)
Continue statin.  Lipid profile today per patient request.

## 2013-05-19 NOTE — Assessment & Plan Note (Signed)
BP 140/82 today, continue current regimen.  Patient will get new home BP cuff as her current one reading high (see HPI). Sent in refills of clonidine, quinapril-HCTZ.

## 2013-05-19 NOTE — Patient Instructions (Signed)
Follow-up with Dr. Stann Mainland in 3 months.   Keep up the good work taking your medicines as prescribed.  Please decrease your morning insulin to 35 units (instead of 38), continue 22 units at night.  Let us know if you experience any symptoms of low blood sugar (see below).  We sent in metformin refill today.   Your blood pressure looks good today.  It seems your blood pressure cuff is no longer giving accurate readings so you likely need a new one.  Continue taking your current medicines, we sent in refills of clonidine and Accuretic today.   We are checking your cholesterol today, I will let you know if the results are abnormal.   Low Blood Sugar Low blood sugar (hypoglycemia) means that the level of sugar in your blood is lower than it should be. Signs of low blood sugar include:  Getting sweaty.  Feeling hungry.  Feeling dizzy or weak.  Feeling sleepier than normal.  Feeling nervous.  Headaches.  Having a fast heartbeat. Low blood sugar can happen fast and can be an emergency. Your doctor can do tests to check your blood sugar level. You can have low blood sugar and not have diabetes. HOME CARE  Check your blood sugar as told by your doctor. If it is less than 70 mg/dl or as told by your doctor, take 1 of the following:  3 to 4 glucose tablets.   cup clear juice.   cup soda pop, not diet.  1 cup milk.  5 to 6 hard candies.  Recheck blood sugar after 15 minutes. Repeat until it is at the right level.  Eat a snack if it is more than 1 hour until the next meal.  Only take medicine as told by your doctor.  Do not skip meals. Eat on time.  Do not drink alcohol except with meals.  Check your blood glucose before driving.  Check your blood glucose before and after exercise.  Always carry treatment with you, such as glucose pills.  Always wear a medical alert bracelet if you have diabetes. GET HELP RIGHT AWAY IF:   Your blood glucose goes below 70 mg/dl or as told  by your doctor, and you:  Are confused.  Are not able to swallow.  Pass out (faint).  You cannot treat yourself. You may need someone to help you.  You have low blood sugar problems often.  You have problems from your medicines.  You are not feeling better after 3 to 4 days.  You have vision changes. MAKE SURE YOU:   Understand these instructions.  Will watch this condition.  Will get help right away if you are not doing well or get worse. Document Released: 05/28/2009 Document Revised: 05/26/2011 Document Reviewed: 05/28/2009 [facility] Patient Information 2014 [place], [place].

## 2013-05-25 NOTE — Progress Notes (Signed)
Case discussed with Lilia Pro at the time of the visit.  We reviewed the resident's history and exam and pertinent patient test results.  I agree with the assessment, diagnosis, and plan of care documented in the resident's note.

## 2013-05-31 ENCOUNTER — Ambulatory Visit (HOSPITAL_COMMUNITY)
Admission: RE | Admit: 2013-05-31 | Discharge: 2013-05-31 | Disposition: A | Discharge status: Home / Self Care | Payer: Self-pay | Source: Ambulatory Visit | Attending: Internal Medicine | Admitting: Internal Medicine

## 2013-05-31 DIAGNOSIS — Z1231 Encounter for screening mammogram for malignant neoplasm of breast: Secondary | ICD-10-CM

## 2013-06-01 ENCOUNTER — Other Ambulatory Visit: Payer: Self-pay | Admitting: Internal Medicine

## 2013-06-10 ENCOUNTER — Encounter: Payer: No Typology Code available for payment source | Admitting: Dietician

## 2013-06-15 ENCOUNTER — Other Ambulatory Visit: Payer: Self-pay | Admitting: *Deleted

## 2013-06-15 DIAGNOSIS — E119 Type 2 diabetes mellitus without complications: Secondary | ICD-10-CM

## 2013-06-20 ENCOUNTER — Other Ambulatory Visit: Payer: Self-pay | Admitting: *Deleted

## 2013-06-20 DIAGNOSIS — E119 Type 2 diabetes mellitus without complications: Secondary | ICD-10-CM

## 2013-06-20 MED ORDER — INSULIN NPH ISOPHANE & REGULAR (70-30) 100 UNIT/ML ~~LOC~~ SUSP: SUBCUTANEOUS | Status: DC

## 2013-06-20 MED ORDER — GLIPIZIDE 10 MG PO TABS: 10.0000 mg | ORAL_TABLET | Freq: Two times a day (BID) | ORAL | Status: DC

## 2013-07-11 NOTE — Addendum Note (Signed)
Addended by: Hulan Fray on: 07/11/2013 04:57 PM   Modules accepted: Orders

## 2013-07-25 ENCOUNTER — Other Ambulatory Visit: Payer: Self-pay | Admitting: *Deleted

## 2013-07-27 MED ORDER — CARVEDILOL 25 MG PO TABS: 25.0000 mg | ORAL_TABLET | Freq: Two times a day (BID) | ORAL | Status: DC

## 2013-09-06 ENCOUNTER — Other Ambulatory Visit: Payer: Self-pay | Admitting: *Deleted

## 2013-09-06 ENCOUNTER — Ambulatory Visit: Payer: No Typology Code available for payment source

## 2013-09-06 DIAGNOSIS — E119 Type 2 diabetes mellitus without complications: Secondary | ICD-10-CM

## 2013-09-06 MED ORDER — INSULIN NPH ISOPHANE & REGULAR (70-30) 100 UNIT/ML ~~LOC~~ SUSP: SUBCUTANEOUS | Status: DC

## 2013-09-20 ENCOUNTER — Other Ambulatory Visit: Payer: Self-pay | Admitting: *Deleted

## 2013-09-20 MED ORDER — CARVEDILOL 25 MG PO TABS: 25.0000 mg | ORAL_TABLET | Freq: Two times a day (BID) | ORAL | Status: DC

## 2013-11-16 ENCOUNTER — Other Ambulatory Visit: Payer: Self-pay | Admitting: *Deleted

## 2013-11-16 DIAGNOSIS — E119 Type 2 diabetes mellitus without complications: Secondary | ICD-10-CM

## 2013-11-16 MED ORDER — INSULIN NPH ISOPHANE & REGULAR (70-30) 100 UNIT/ML ~~LOC~~ SUSP: SUBCUTANEOUS | Status: DC

## 2013-11-16 MED ORDER — QUINAPRIL-HYDROCHLOROTHIAZIDE 20-12.5 MG PO TABS: 2.0000 | ORAL_TABLET | Freq: Every day | ORAL | Status: DC

## 2013-11-16 MED ORDER — CARVEDILOL 25 MG PO TABS: 25.0000 mg | ORAL_TABLET | Freq: Two times a day (BID) | ORAL | Status: DC

## 2013-11-16 MED ORDER — METFORMIN HCL 1000 MG PO TABS: 1000.0000 mg | ORAL_TABLET | Freq: Two times a day (BID) | ORAL | Status: DC

## 2013-11-22 ENCOUNTER — Other Ambulatory Visit: Payer: Self-pay | Admitting: *Deleted

## 2013-11-22 DIAGNOSIS — I10 Essential (primary) hypertension: Secondary | ICD-10-CM

## 2013-11-22 MED ORDER — CLONIDINE HCL 0.3 MG PO TABS: 0.3000 mg | ORAL_TABLET | Freq: Two times a day (BID) | ORAL | Status: DC

## 2013-11-23 ENCOUNTER — Ambulatory Visit (INDEPENDENT_AMBULATORY_CARE_PROVIDER_SITE_OTHER): Payer: No Typology Code available for payment source | Admitting: Internal Medicine

## 2013-11-23 VITALS — BP 178/110 | HR 72 | Temp 98.0°F | Ht 68.0 in | Wt 268.0 lb

## 2013-11-23 DIAGNOSIS — E1165 Type 2 diabetes mellitus with hyperglycemia: Secondary | ICD-10-CM

## 2013-11-23 DIAGNOSIS — IMO0001 Reserved for inherently not codable concepts without codable children: Secondary | ICD-10-CM

## 2013-11-23 DIAGNOSIS — Z23 Encounter for immunization: Secondary | ICD-10-CM

## 2013-11-23 DIAGNOSIS — E119 Type 2 diabetes mellitus without complications: Secondary | ICD-10-CM

## 2013-11-23 DIAGNOSIS — Z299 Encounter for prophylactic measures, unspecified: Secondary | ICD-10-CM

## 2013-11-23 DIAGNOSIS — E785 Hyperlipidemia, unspecified: Secondary | ICD-10-CM

## 2013-11-23 DIAGNOSIS — I10 Essential (primary) hypertension: Secondary | ICD-10-CM

## 2013-11-23 LAB — GLUCOSE, CAPILLARY: Glucose-Capillary: 132 mg/dL — ABNORMAL HIGH (ref 70–99)

## 2013-11-23 LAB — BASIC METABOLIC PANEL
BUN: 15 mg/dL (ref 6–23)
CHLORIDE: 103 meq/L (ref 96–112)
CO2: 25 meq/L (ref 19–32)
Calcium: 9.8 mg/dL (ref 8.4–10.5)
Creat: 0.83 mg/dL (ref 0.50–1.10)
Glucose, Bld: 127 mg/dL — ABNORMAL HIGH (ref 70–99)
POTASSIUM: 3.9 meq/L (ref 3.5–5.3)
SODIUM: 138 meq/L (ref 135–145)

## 2013-11-23 LAB — POCT GLYCOSYLATED HEMOGLOBIN (HGB A1C): Hemoglobin A1C: 8.7

## 2013-11-23 MED ORDER — METFORMIN HCL 1000 MG PO TABS: 1000.0000 mg | ORAL_TABLET | Freq: Two times a day (BID) | ORAL | Status: DC

## 2013-11-23 MED ORDER — CARVEDILOL 25 MG PO TABS: 25.0000 mg | ORAL_TABLET | Freq: Two times a day (BID) | ORAL | Status: DC

## 2013-11-23 MED ORDER — INSULIN NPH ISOPHANE & REGULAR (70-30) 100 UNIT/ML ~~LOC~~ SUSP: SUBCUTANEOUS | Status: DC

## 2013-11-23 MED ORDER — ASPIRIN 81 MG PO TBEC: 81.0000 mg | DELAYED_RELEASE_TABLET | Freq: Every day | ORAL | Status: DC

## 2013-11-23 MED ORDER — SIMVASTATIN 40 MG PO TABS: 40.0000 mg | ORAL_TABLET | Freq: Every evening | ORAL | Status: DC

## 2013-11-23 MED ORDER — GLIPIZIDE 10 MG PO TABS: 10.0000 mg | ORAL_TABLET | Freq: Two times a day (BID) | ORAL | Status: DC

## 2013-11-23 MED ORDER — CLONIDINE HCL 0.3 MG PO TABS: 0.3000 mg | ORAL_TABLET | Freq: Two times a day (BID) | ORAL | Status: DC

## 2013-11-23 MED ORDER — QUINAPRIL-HYDROCHLOROTHIAZIDE 20-12.5 MG PO TABS: 2.0000 | ORAL_TABLET | Freq: Every day | ORAL | Status: DC

## 2013-11-23 MED ORDER — AMLODIPINE BESYLATE 10 MG PO TABS: 10.0000 mg | ORAL_TABLET | Freq: Every day | ORAL | Status: DC

## 2013-11-23 NOTE — Assessment & Plan Note (Signed)
Lab Results  Component Value Date   HGBA1C 8.7 11/23/2013   HGBA1C 8.7 04/18/2013   HGBA1C 7.5 10/29/2012     Assessment: Diabetes control:  Uncontrolled Progress toward A1C goal:   Deteriorated Comments: Patient is on metformin 1000 mg BID, Glipizide 10 mg BID and insulin 70/30 35 units in the morning and 22 units at night. She reports 2-3 hypoglycemic events during the month, but they were asymptomatic. She is unable to correlate them with a specific time of day. They seem to occur randomly. It also appears that her blood glucose fluctuates drastically during the day without a clear pattern. This is likely due to diet.  Plan: Medications:  continue current medications Home glucose monitoring: Frequency:  3 times a day Timing:  In the morning before breakfast, before lunch, and before dinner Instruction/counseling given: reminded to get eye exam, reminded to bring blood glucose meter & log to each visit, reminded to bring medications to each visit, discussed the need for weight loss, discussed diet and provided printed educational material Educational resources provided:  Handout Self management tools provided:   Other plans: We will continue her current medications for now. Patient is very resistant to being switched to basal/bolus insulin 4 times a day. I explained to her the seriousness of her diabetes and the importance of control. We have agreed to improve her glucose levels over the next 3 months using diet control and exercise. I will recommend insulin 4 times a day at the next visit if her HgA1c is not improved more than 8.4. I will also send a referral to [name] for diabetes education.

## 2013-11-23 NOTE — Progress Notes (Signed)
Subjective:     Patient ID: Ashley Long, female   DOB: 07-Jun-1960, 53 y.o.   MRN: [id number]  HPI Ashley Long is a 53 y.o. woman with history of DM2 (A1C 8.7% in 04/2013), HTN, HLD who presents for routine follow up. Patient states she is doing well and has no complaints. Please see individual problems for more history.   Review of Systems General: Denies fever, chills, fatigue, change in appetite and diaphoresis.  Respiratory: Denies SOB, cough, DOE, chest tightness, and wheezing.   Cardiovascular: Denies chest pain and palpitations.  Gastrointestinal: Denies nausea, vomiting, abdominal pain, diarrhea, constipation, blood in stool and abdominal distention.  Genitourinary: Denies dysuria, urgency, frequency, hematuria, suprapubic pain and flank pain. Endocrine: Denies hot or cold intolerance, polyuria, and polydipsia. Musculoskeletal: Denies myalgias, back pain, joint swelling, arthralgias and gait problem.  Skin: Denies pallor, rash and wounds.  Neurological: Denies dizziness, headaches, weakness, lightheadedness, numbness, and syncope. Psychiatric/Behavioral: Denies mood changes, confusion, nervousness, sleep disturbance and agitation.     Objective:   Physical Exam Filed Vitals:   11/23/13 1339  BP: 178/110  Pulse: 72  Temp: 98 F (36.7 C)  TempSrc: Oral  Height: 5\' 8"  (1.727 m)  Weight: 268 lb (121.564 kg)  SpO2: 100%   General: Vital signs reviewed.  Patient is well-developed and well-nourished, in no acute distress and cooperative with exam.  Cardiovascular: RRR, S1 normal, S2 normal, no murmurs, gallops, or rubs. Pulmonary/Chest: Clear to auscultation bilaterally, no wheezes, rales, or rhonchi. Abdominal: Soft, non-tender, non-distended, BS +, no masses, organomegaly, or guarding present.  Musculoskeletal: No joint deformities, erythema, or stiffness, ROM full and nontender. Extremities: No lower extremity edema bilaterally,  pulses symmetric and intact bilaterally. No  cyanosis or clubbing. Skin: Warm, dry and intact. No rashes or erythema. Psychiatric: Normal mood and affect. speech and behavior is normal. Cognition and memory are normal.      Assessment:     Please see problem based assessment and plan.    Plan:

## 2013-11-23 NOTE — Assessment & Plan Note (Addendum)
Patient received flu shot today 11/23/13.  Patient received last pap smear in November 2013. She will be due for her next pap in November 2016.  Patient had a mammogram in March 2015 which was normal. She will need a repeat screening in March 2016.  Patient had a colonoscopy in January 2015 which showed 2 small polyps, 5 mm and 8 mm. The pathology showed no high grade dysplasia or malignancy.

## 2013-11-23 NOTE — Assessment & Plan Note (Signed)
Assessment: Last lipid profile on 05/19/13 showed total cholesterol of 133, TG 120, HDL 50, and LDL 59. Well controlled on simvastatin 40 mg daily.  Plan: Continue simvastatin 40 mg daily and repeat lipid profile in March 2016.

## 2013-11-23 NOTE — Assessment & Plan Note (Signed)
BP Readings from Last 3 Encounters:  11/23/13 178/110  05/19/13 140/82  04/18/13 140/70    Lab Results  Component Value Date   NA 138 10/29/2012   K 4.2 10/29/2012   CREATININE 1.08 10/29/2012    Assessment: Blood pressure control:  Uncontrolled Progress toward BP goal:   Deteriorated Comments: Patient reports blood pressures in the 140s-150s at home. Today her BP was 178/110 and 170/100 on repeat. Patient states her blood pressure is normally elevated in the clinic. She denies any headache.  Plan: Medications:  continue current medications Educational resources provided:   Self management tools provided:  Blood pressure log Other plans: We will continue current medications, but patient has been instructed to keep a careful log for review on her next visit. Hypertensive medications may need to be adjusted at this time if blood pressure continues to be elevated. We will also check a BMET today.

## 2013-11-23 NOTE — Patient Instructions (Signed)
General Instructions:   Please bring your medicines with you each time you come to clinic.  Medicines may include prescription medications, over-the-counter medications, herbal remedies, eye drops, vitamins, or other pills.  Diabetes: Please continue your current medications. Please work on eating a healthier diet with less carbs and sugar and more vegetables and protein. We will consider adding insulin 4 times a day in 3 months if HgbA1C is not improved to less than 8.4.   High Blood Pressure: Please keep a log of your blood pressures at home using the book I gave you. We may need to consider increasing your blood pressure medications if your blood pressures are too high.   Self Care Goals & Plans:  Self Care Goal 10/29/2012  Manage my medications take my medicines as prescribed; bring my medications to every visit  Monitor my health keep track of my blood glucose; check my feet daily; bring my glucose meter and log to each visit  Eat healthy foods eat foods that are low in salt  Be physically active take a walk every day    No flowsheet data found.   Care Management & Community Referrals:  Referral 10/29/2012  Referrals made for care management support none needed     Diabetes and Exercise Exercising regularly is important. It is not just about losing weight. It has many health benefits, such as:  Improving your overall fitness, flexibility, and endurance.  Increasing your bone density.  Helping with weight control.  Decreasing your body fat.  Increasing your muscle strength.  Reducing stress and tension.  Improving your overall health. People with diabetes who exercise gain additional benefits because exercise:  Reduces appetite.  Improves the body's use of blood sugar (glucose).  Helps lower or control blood glucose.  Decreases blood pressure.  Helps control blood lipids (such as cholesterol and triglycerides).  Improves the body's use of the hormone insulin  by:  Increasing the body's insulin sensitivity.  Reducing the body's insulin needs.  Decreases the risk for heart disease because exercising:  Lowers cholesterol and triglycerides levels.  Increases the levels of good cholesterol (such as high-density lipoproteins [HDL]) in the body.  Lowers blood glucose levels. YOUR ACTIVITY PLAN  Choose an activity that you enjoy and set realistic goals. Your health care provider or diabetes educator can help you make an activity plan that works for you. Exercise regularly as directed by your health care provider. This includes:  Performing resistance training twice a week such as push-ups, sit-ups, lifting weights, or using resistance bands.  Performing 150 minutes of cardio exercises each week such as walking, running, or playing sports.  Staying active and spending no more than 90 minutes at one time being inactive. Even short bursts of exercise are good for you. Three 10-minute sessions spread throughout the day are just as beneficial as a single 30-minute session. Some exercise ideas include:  Taking the dog for a walk.  Taking the stairs instead of the elevator.  Dancing to your favorite song.  Doing an exercise video.  Doing your favorite exercise with a friend. RECOMMENDATIONS FOR EXERCISING WITH TYPE 1 OR TYPE 2 DIABETES   Check your blood glucose before exercising. If blood glucose levels are greater than 240 mg/dL, check for urine ketones. Do not exercise if ketones are present.  Avoid injecting insulin into areas of the body that are going to be exercised. For example, avoid injecting insulin into:  The arms when playing tennis.  The legs when jogging.  Keep a record of:  Food intake before and after you exercise.  Expected peak times of insulin action.  Blood glucose levels before and after you exercise.  The type and amount of exercise you have done.  Review your records with your health care provider. Your health  care provider will help you to develop guidelines for adjusting food intake and insulin amounts before and after exercising.  If you take insulin or oral hypoglycemic agents, watch for signs and symptoms of hypoglycemia. They include:  Dizziness.  Shaking.  Sweating.  Chills.  Confusion.  Drink plenty of water while you exercise to prevent dehydration or heat stroke. Body water is lost during exercise and must be replaced.  Talk to your health care provider before starting an exercise program to make sure it is safe for you. Remember, almost any type of activity is better than none. Document Released: 05/24/2003 Document Revised: 07/18/2013 Document Reviewed: 08/10/2012 [facility] Patient Information 2015 [place], [place]. This information is not intended to replace advice given to you by your health care provider. Make sure you discuss any questions you have with your health care provider.

## 2013-11-24 ENCOUNTER — Encounter: Payer: Self-pay | Admitting: Licensed Clinical Social Worker

## 2013-11-24 NOTE — Progress Notes (Signed)
Ashley Long requesting to meet with CSW today to inquire about housing options.  Pt currently lives with her son and has recently been approved for disability and will have income.  Ashley Long is already on the [facility] wait list and wanted to explore options.  CSW utilized [facility] for available apartments.  Provided listing and the contact information to [employer] to Ashley Long.  Pt denies add'l needs at this time.

## 2013-11-28 NOTE — Addendum Note (Signed)
Addended by: Gilles Chiquito B on: 11/28/2013 03:03 PM   Modules accepted: Level of Service

## 2013-11-28 NOTE — Progress Notes (Signed)
Internal Medicine Clinic Attending Date of visit: 11/23/2013  I saw and evaluated the patient.  I personally confirmed the key portions of the history and exam documented by Dr. Marvel Plan and I reviewed pertinent patient test results.  The assessment, diagnosis, and plan were formulated together and I agree with the documentation in the resident's note.

## 2013-12-27 ENCOUNTER — Encounter: Payer: No Typology Code available for payment source | Admitting: Dietician

## 2014-01-16 ENCOUNTER — Encounter: Payer: Self-pay | Admitting: Internal Medicine

## 2014-01-21 ENCOUNTER — Other Ambulatory Visit: Payer: Self-pay | Admitting: Internal Medicine

## 2014-01-21 DIAGNOSIS — E785 Hyperlipidemia, unspecified: Secondary | ICD-10-CM

## 2014-01-24 ENCOUNTER — Ambulatory Visit: Payer: No Typology Code available for payment source

## 2014-01-31 ENCOUNTER — Ambulatory Visit: Payer: Self-pay

## 2014-02-01 NOTE — Addendum Note (Signed)
Addended by: Hulan Fray on: 02/01/2014 05:50 PM   Modules accepted: Orders

## 2014-02-03 ENCOUNTER — Other Ambulatory Visit: Payer: Self-pay | Admitting: *Deleted

## 2014-02-03 DIAGNOSIS — E119 Type 2 diabetes mellitus without complications: Secondary | ICD-10-CM

## 2014-02-03 MED ORDER — INSULIN NPH ISOPHANE & REGULAR (70-30) 100 UNIT/ML ~~LOC~~ SUSP: SUBCUTANEOUS | Status: DC

## 2014-02-03 NOTE — Telephone Encounter (Signed)
Pt only needs 1 vial sent to [facility]

## 2014-02-17 ENCOUNTER — Encounter: Payer: Self-pay | Admitting: *Deleted

## 2014-02-22 ENCOUNTER — Other Ambulatory Visit: Payer: Self-pay | Admitting: Internal Medicine

## 2014-02-22 DIAGNOSIS — E1110 Type 2 diabetes mellitus with ketoacidosis without coma: Secondary | ICD-10-CM

## 2014-02-28 ENCOUNTER — Ambulatory Visit: Payer: Self-pay | Admitting: Dietician

## 2014-03-07 ENCOUNTER — Ambulatory Visit: Payer: Self-pay | Admitting: Dietician

## 2014-03-14 ENCOUNTER — Ambulatory Visit: Payer: Self-pay | Admitting: Dietician

## 2014-03-14 ENCOUNTER — Encounter: Payer: Self-pay | Admitting: Dietician

## 2014-03-14 ENCOUNTER — Other Ambulatory Visit: Payer: Self-pay | Admitting: Dietician

## 2014-03-14 DIAGNOSIS — E1110 Type 2 diabetes mellitus with ketoacidosis without coma: Secondary | ICD-10-CM

## 2014-03-14 LAB — HM DIABETES EYE EXAM

## 2014-03-14 NOTE — Progress Notes (Signed)
Retinal images done and transmitted.  

## 2014-03-22 ENCOUNTER — Encounter: Payer: Self-pay | Admitting: Internal Medicine

## 2014-03-23 ENCOUNTER — Encounter: Payer: Self-pay | Admitting: *Deleted

## 2014-03-29 ENCOUNTER — Encounter: Payer: Self-pay | Admitting: Internal Medicine

## 2014-03-29 ENCOUNTER — Ambulatory Visit (INDEPENDENT_AMBULATORY_CARE_PROVIDER_SITE_OTHER): Payer: Self-pay | Admitting: Internal Medicine

## 2014-03-29 VITALS — BP 184/112 | HR 74 | Temp 97.8°F | Ht 68.0 in | Wt 270.5 lb

## 2014-03-29 DIAGNOSIS — I10 Essential (primary) hypertension: Secondary | ICD-10-CM

## 2014-03-29 DIAGNOSIS — E119 Type 2 diabetes mellitus without complications: Secondary | ICD-10-CM | POA: Insufficient documentation

## 2014-03-29 DIAGNOSIS — Z418 Encounter for other procedures for purposes other than remedying health state: Secondary | ICD-10-CM

## 2014-03-29 DIAGNOSIS — E1110 Type 2 diabetes mellitus with ketoacidosis without coma: Secondary | ICD-10-CM

## 2014-03-29 DIAGNOSIS — Z299 Encounter for prophylactic measures, unspecified: Secondary | ICD-10-CM

## 2014-03-29 DIAGNOSIS — E1310 Other specified diabetes mellitus with ketoacidosis without coma: Secondary | ICD-10-CM

## 2014-03-29 DIAGNOSIS — E1121 Type 2 diabetes mellitus with diabetic nephropathy: Secondary | ICD-10-CM | POA: Insufficient documentation

## 2014-03-29 LAB — POCT GLYCOSYLATED HEMOGLOBIN (HGB A1C): Hemoglobin A1C: 8.7

## 2014-03-29 LAB — GLUCOSE, CAPILLARY: Glucose-Capillary: 141 mg/dL — ABNORMAL HIGH (ref 70–99)

## 2014-03-29 MED ORDER — INSULIN NPH ISOPHANE & REGULAR (70-30) 100 UNIT/ML ~~LOC~~ SUSP: SUBCUTANEOUS | Status: DC

## 2014-03-29 NOTE — Assessment & Plan Note (Signed)
Lab Results  Component Value Date   HGBA1C 8.7 03/29/2014   HGBA1C 8.7 11/23/2013   HGBA1C 8.7 04/18/2013     Assessment: Diabetes control:  Uncontrolled Progress toward A1C goal:   Stable, unchanged Comments: Patient has been 8.7 for about one year. She has been compliant with her medications. Upon review of her glucose log, patient has been having occasional hypoglycemic episodes in the morning with hyperglycemic episodes at night, likely from over-compensation with juice and food. Patient has had infrequent hypoglycemic episodes in the afternoon as well.   Plan: Medications:  Continue 70/30 35 units in the morning and DECREASE evening dose to 18 units QHS. Continue metformin and glipizide as previously prescribed. Home glucose monitoring: Frequency:   QID Timing:  AC/HS Instruction/counseling given: reminded to bring blood glucose meter & log to each visit, discussed the need for weight loss and discussed diet Educational resources provided:   Self management tools provided: copy of home glucose meter download Other plans: I encouraged patient and educated her on not overcompensating for hypoglycemic episodes, watching her diet, and exercising. Patient would benefit from basal/bolus regimen, but currently she has the orange card and we are limited with this. Patient will check her blood glucose and follow up in 2 weeks for recheck.

## 2014-03-29 NOTE — Progress Notes (Signed)
   Subjective:    Patient ID: Ashley Long, female    DOB: Dec 01, 1960, 54 y.o.   MRN: [id number]  HPI Ashley Long is a 54 yo female with PMHx of HTN, T2DM, HLD, and obesity who presents for routine follow up. Please see problem based assessment and plan for more information.   Review of Systems General: Denies fever, chills, fatigue Respiratory: Denies SOB, cough   Cardiovascular: Denies chest pain Gastrointestinal: Denies nausea, vomiting, abdominal pain, diarrhea, constipation Endocrine: Denies hot or cold intolerance, polyuria, and polydipsia. Musculoskeletal: Denies myalgias Neurological: Denies dizziness, headaches, weakness, lightheadedness  Past Medical History  Diagnosis Date  . DIABETES MELLITUS, TYPE II 06/02/2006    Last HBA1C 8.7.   . HYPERLIPIDEMIA 06/02/2006    Qualifier: Diagnosis of  By: Patrice Paradise MD, Roderic Palau    . HYPERTENSION 06/02/2006    Filed Vitals:   01/21/11 1134  BP: 162/98  Pulse: 80  Temp: 97.4 F (36.3 C)  TempSrc: Oral  Resp: 20  Height: _0  (1.727 m)  Weight: 261 lb (118.389 kg)      Outpatient Encounter Prescriptions as of 03/29/2014  Medication Sig  . amLODipine (NORVASC) 10 MG tablet Take 1 tablet (10 mg total) by mouth at bedtime.  Marland Kitchen aspirin 81 MG EC tablet Take 1 tablet (81 mg total) by mouth daily.  . Blood Glucose Monitoring Suppl (WAVESENSE PRESTO) W/DEVICE KIT 1 each by Does not apply route 2 (two) times daily.  . carvedilol (COREG) 25 MG tablet Take 1 tablet (25 mg total) by mouth 2 (two) times daily.  . cloNIDine (CATAPRES) 0.3 MG tablet Take 1 tablet (0.3 mg total) by mouth 2 (two) times daily.  Marland Kitchen glipiZIDE (GLUCOTROL) 10 MG tablet Take 1 tablet (10 mg total) by mouth 2 (two) times daily.  . insulin NPH-regular Human (NOVOLIN 70/30) (70-30) 100 UNIT/ML injection 35 units in the AM and 18 units in the PM 15 minutes prior to food  . Lancets 30G MISC 1 each by Does not apply route 2 (two) times daily.  . metFORMIN (GLUCOPHAGE) 1000 MG tablet Take  1 tablet (1,000 mg total) by mouth 2 (two) times daily.  . Multiple Vitamins-Minerals (CENTRUM) tablet Take 1 tablet by mouth daily.    . quinapril-hydrochlorothiazide (ACCURETIC) 20-12.5 MG per tablet Take 2 tablets by mouth daily.  . simvastatin (ZOCOR) 40 MG tablet take 1 tablet by mouth once daily  . [DISCONTINUED] insulin NPH-regular Human (NOVOLIN 70/30) (70-30) 100 UNIT/ML injection 35 units in the AM and 22 units in the PM 15 minutes prior to food       Objective:   Physical Exam Filed Vitals:   03/29/14 1506 03/29/14 1620  BP: 182/112 184/112  Pulse: 74   Temp: 97.8 F (36.6 C)   TempSrc: Oral   Height: _1  (1.727 m)   Weight: 270 lb 8 oz (122.698 kg)   SpO2: 98%    General: Vital signs reviewed.  Patient is well-developed and well-nourished, in no acute distress and cooperative with exam.  Cardiovascular: RRR, S1 normal, S2 normal, no murmurs, gallops, or rubs. Pulmonary/Chest: Clear to auscultation bilaterally, no wheezes, rales, or rhonchi. Abdominal: Soft, non-tender, non-distended, BS + Extremities: No lower extremity edema bilaterally Psychiatric: Normal mood and affect. speech and behavior is normal. Cognition and memory are normal.      Assessment & Plan:   Please see problem based assessment and plan.

## 2014-03-29 NOTE — Assessment & Plan Note (Signed)
.   BP Readings from Last 3 Encounters:  03/29/14 184/112  11/23/13 178/110  05/19/13 140/82    Lab Results  Component Value Date   NA 138 11/23/2013   K 3.9 11/23/2013   CREATININE 0.83 11/23/2013    Assessment: Blood pressure control:  Uncontrolled Progress toward BP goal:   Deteriorate Comments: Patient stated last visit that her BP is well controlled at home. Patient brought in her BP monitor to show [place] her log. Her BP runs in the 120s-150s/80s-90s. She occasionally will have elevated BPs in the 160s, but these are infrequent. I do believe her BP is well controlled at home. It is unusual how hypertensive she is on clinic visits, but she states she has always been anxious about seeing the doctor. Glenda kindly rechecked her BP using her machine and then again with our machine to compare the values. The values were similar; therefore, her BP readings from home are accurate.   Plan: Medications:  continue current medications Self management tools provided:  Continue to check BP at home Other plans: Follow up in 2 weeks for recheck

## 2014-03-29 NOTE — Patient Instructions (Signed)
General Instructions:   Please bring your medicines with you each time you come to clinic.  Medicines may include prescription medications, over-the-counter medications, herbal remedies, eye drops, vitamins, or other pills.   Progress Toward Treatment Goals:  Treatment Goal 10/29/2012  Hemoglobin A1C at goal  Blood pressure at goal    Self Care Goals & Plans:  Self Care Goal 10/29/2012  Manage my medications take my medicines as prescribed; bring my medications to every visit  Monitor my health keep track of my blood glucose; check my feet daily; bring my glucose meter and log to each visit  Eat healthy foods eat foods that are low in salt  Be physically active take a walk every day    FOR YOUR DIABETES: -TAKE 35 UNITS IN THE MORNING AND 18 UNITS AT NIGHT TO PREVENT THE LOW BLOOD SUGARS -TRY NOT TO OVER-COMPENSATE FOR LOW BLOOD SUGAR BY CONSUMING [place] -CONTINUE TO TRY TO EAT HEALTHY AND EXERCISE BY WALKING -KEEP CHECKING YOUR BLOOD SUGAR AND RETURN IN 2 WEEKS FOR RECHECK  FOR YOUR BLOOD PRESSURE: -CONTINUE YOUR MEDICATIONS AS PRESCRIBED -KEEP A LOG OF YOUR BLOOD PRESSURE  Diabetes and Exercise Exercising regularly is important. It is not just about losing weight. It has many health benefits, such as:  Improving your overall fitness, flexibility, and endurance.  Increasing your bone density.  Helping with weight control.  Decreasing your body fat.  Increasing your muscle strength.  Reducing stress and tension.  Improving your overall health. People with diabetes who exercise gain additional benefits because exercise:  Reduces appetite.  Improves the body's use of blood sugar (glucose).  Helps lower or control blood glucose.  Decreases blood pressure.  Helps control blood lipids (such as cholesterol and triglycerides).  Improves the body's use of the hormone insulin by:  Increasing the body's insulin sensitivity.  Reducing the body's  insulin needs.  Decreases the risk for heart disease because exercising:  Lowers cholesterol and triglycerides levels.  Increases the levels of good cholesterol (such as high-density lipoproteins [HDL]) in the body.  Lowers blood glucose levels. YOUR ACTIVITY PLAN  Choose an activity that you enjoy and set realistic goals. Your health care provider or diabetes educator can help you make an activity plan that works for you. Exercise regularly as directed by your health care provider. This includes:  Performing resistance training twice a week such as push-ups, sit-ups, lifting weights, or using resistance bands.  Performing 150 minutes of cardio exercises each week such as walking, running, or playing sports.  Staying active and spending no more than 90 minutes at one time being inactive. Even short bursts of exercise are good for you. Three 10-minute sessions spread throughout the day are just as beneficial as a single 30-minute session. Some exercise ideas include:  Taking the dog for a walk.  Taking the stairs instead of the elevator.  Dancing to your favorite song.  Doing an exercise video.  Doing your favorite exercise with a friend. RECOMMENDATIONS FOR EXERCISING WITH TYPE 1 OR TYPE 2 DIABETES   Check your blood glucose before exercising. If blood glucose levels are greater than 240 mg/dL, check for urine ketones. Do not exercise if ketones are present.  Avoid injecting insulin into areas of the body that are going to be exercised. For example, avoid injecting insulin into:  The arms when playing tennis.  The legs when jogging.  Keep a record of:  Food intake before and after you exercise.  Expected  peak times of insulin action.  Blood glucose levels before and after you exercise.  The type and amount of exercise you have done.  Review your records with your health care provider. Your health care provider will help you to develop guidelines for adjusting food  intake and insulin amounts before and after exercising.  If you take insulin or oral hypoglycemic agents, watch for signs and symptoms of hypoglycemia. They include:  Dizziness.  Shaking.  Sweating.  Chills.  Confusion.  Drink plenty of water while you exercise to prevent dehydration or heat stroke. Body water is lost during exercise and must be replaced.  Talk to your health care provider before starting an exercise program to make sure it is safe for you. Remember, almost any type of activity is better than none. Document Released: 05/24/2003 Document Revised: 07/18/2013 Document Reviewed: 08/10/2012 [facility] Patient Information 2015 [place], [place]. This information is not intended to replace advice given to you by your health care provider. Make sure you discuss any questions you have with your health care provider.  -RETURN IN 2 WEEKS FOR RECHECK

## 2014-03-30 NOTE — Progress Notes (Signed)
INTERNAL MEDICINE TEACHING ATTENDING ADDENDUM - Berton Butrick, MD: I reviewed and discussed at the time of visit with the resident Dr. Richardson, the patient's medical history, physical examination, diagnosis and results of pertinent tests and treatment and I agree with the patient's care as documented.  

## 2014-04-17 ENCOUNTER — Telehealth: Payer: Self-pay | Admitting: Internal Medicine

## 2014-04-17 NOTE — Telephone Encounter (Signed)
Call to patient to confirm appointment for 04/18/14 at 10:45 am. Patient confirmed.

## 2014-04-18 ENCOUNTER — Encounter: Payer: Self-pay | Admitting: Internal Medicine

## 2014-04-18 ENCOUNTER — Ambulatory Visit (INDEPENDENT_AMBULATORY_CARE_PROVIDER_SITE_OTHER): Payer: Self-pay | Admitting: Internal Medicine

## 2014-04-18 VITALS — BP 154/82 | HR 76 | Temp 97.8°F | Ht 68.0 in | Wt 271.6 lb

## 2014-04-18 DIAGNOSIS — I10 Essential (primary) hypertension: Secondary | ICD-10-CM

## 2014-04-18 DIAGNOSIS — Z299 Encounter for prophylactic measures, unspecified: Secondary | ICD-10-CM

## 2014-04-18 DIAGNOSIS — E1310 Other specified diabetes mellitus with ketoacidosis without coma: Secondary | ICD-10-CM

## 2014-04-18 DIAGNOSIS — E785 Hyperlipidemia, unspecified: Secondary | ICD-10-CM

## 2014-04-18 DIAGNOSIS — Z794 Long term (current) use of insulin: Secondary | ICD-10-CM

## 2014-04-18 DIAGNOSIS — E119 Type 2 diabetes mellitus without complications: Secondary | ICD-10-CM

## 2014-04-18 MED ORDER — INSULIN NPH ISOPHANE & REGULAR (70-30) 100 UNIT/ML ~~LOC~~ SUSP: SUBCUTANEOUS | Status: DC

## 2014-04-18 NOTE — Assessment & Plan Note (Signed)
BP Readings from Last 3 Encounters:  04/18/14 154/82  03/29/14 184/112  11/23/13 178/110    Lab Results  Component Value Date   NA 138 11/23/2013   K 3.9 11/23/2013   CREATININE 0.83 11/23/2013    Assessment: Blood pressure control:  Controlled at home base on blood pressure logs Progress toward BP goal:   Improved, patient averages 120/80s at home  Plan: Medications:  continue current medications Educational resources provided: brochure Self management tools provided: home blood pressure logbook

## 2014-04-18 NOTE — Patient Instructions (Signed)
General Instructions:   Please bring your medicines with you each time you come to clinic.  Medicines may include prescription medications, over-the-counter medications, herbal remedies, eye drops, vitamins, or other pills.   TAKE INSULIN 35 UNITS IN THE MORNING AND 20 UNITS AT NIGHT. ADJUST AS NEEDED.  FOLLOW UP IN 3 MONTHS OR SOONER IF NEEDED!  Hypoglycemia Hypoglycemia occurs when the glucose in your blood is too low. Glucose is a type of sugar that is your body's main energy source. Hormones, such as insulin and glucagon, control the level of glucose in the blood. Insulin lowers blood glucose and glucagon increases blood glucose. Having too much insulin in your blood stream, or not eating enough food containing sugar, can result in hypoglycemia. Hypoglycemia can happen to people with or without diabetes. It can develop quickly and can be a medical emergency.  CAUSES   Missing or delaying meals.  Not eating enough carbohydrates at meals.  Taking too much diabetes medicine.  Not timing your oral diabetes medicine or insulin doses with meals, snacks, and exercise.  Nausea and vomiting.  Certain medicines.  Severe illnesses, such as hepatitis, kidney disorders, and certain eating disorders.  Increased activity or exercise without eating something extra or adjusting medicines.  Drinking too much alcohol.  A nerve disorder that affects body functions like your heart rate, blood pressure, and digestion (autonomic neuropathy).  A condition where the stomach muscles do not function properly (gastroparesis). Therefore, medicines and food may not absorb properly.  Rarely, a tumor of the pancreas can produce too much insulin. SYMPTOMS   Hunger.  Sweating (diaphoresis).  Change in body temperature.  Shakiness.  Headache.  Anxiety.  Lightheadedness.  Irritability.  Difficulty concentrating.  Dry mouth.  Tingling or numbness in the hands or feet.  Restless sleep or  sleep disturbances.  Altered speech and coordination.  Change in mental status.  Seizures or prolonged convulsions.  Combativeness.  Drowsiness (lethargic).  Weakness.  Increased heart rate or palpitations.  Confusion.  Pale, gray skin color.  Blurred or double vision.  Fainting. DIAGNOSIS  A physical exam and medical history will be performed. Your caregiver may make a diagnosis based on your symptoms. Blood tests and other lab tests may be performed to confirm a diagnosis. Once the diagnosis is made, your caregiver will see if your signs and symptoms go away once your blood glucose is raised.  TREATMENT  Usually, you can easily treat your hypoglycemia when you notice symptoms.  Check your blood glucose. If it is less than 70 mg/dl, take one of the following:   3-4 glucose tablets.    cup juice.    cup regular soda.   1 cup skim milk.   -1 tube of glucose gel.   5-6 hard candies.   Avoid high-fat drinks or food that may delay a rise in blood glucose levels.  Do not take more than the recommended amount of sugary foods, drinks, gel, or tablets. Doing so will cause your blood glucose to go too high.   Wait 10-15 minutes and recheck your blood glucose. If it is still less than 70 mg/dl or below your target range, repeat treatment.   Eat a snack if it is more than 1 hour until your next meal.  There may be a time when your blood glucose may go so low that you are unable to treat yourself at home when you start to notice symptoms. You may need someone to help you. You may even faint or  be unable to swallow. If you cannot treat yourself, someone will need to bring you to the hospital.  [place]  If you have diabetes, follow your diabetes management plan by:  Taking your medicines as directed.  Following your exercise plan.  Following your meal plan. Do not skip meals. Eat on time.  Testing your blood glucose regularly. Check your blood  glucose before and after exercise. If you exercise longer or different than usual, be sure to check blood glucose more frequently.  Wearing your medical alert jewelry that says you have diabetes.  Identify the cause of your hypoglycemia. Then, develop ways to prevent the recurrence of hypoglycemia.  Do not take a hot bath or shower right after an insulin shot.  Always carry treatment with you. Glucose tablets are the easiest to carry.  If you are going to drink alcohol, drink it only with meals.  Tell friends or family members ways to keep you safe during a seizure. This may include removing hard or sharp objects from the area or turning you on your side.  Maintain a healthy weight. SEEK MEDICAL CARE IF:   You are having problems keeping your blood glucose in your target range.  You are having frequent episodes of hypoglycemia.  You feel you might be having side effects from your medicines.  You are not sure why your blood glucose is dropping so low.  You notice a change in vision or a new problem with your vision. SEEK IMMEDIATE MEDICAL CARE IF:   Confusion develops.  A change in mental status occurs.  The inability to swallow develops.  Fainting occurs. Document Released: 03/03/2005 Document Revised: 03/08/2013 Document Reviewed: 06/30/2011 [facility] Patient Information 2015 [place], [place]. This information is not intended to replace advice given to you by your health care provider. Make sure you discuss any questions you have with your health care provider.

## 2014-04-18 NOTE — Progress Notes (Signed)
   Subjective:    Patient ID: Ashley Long, female    DOB: 1960/05/13, 54 y.o.   MRN: [id number]  HPI Ashley Long is a 54 yo female with PMHx of HTN, T2DM, and HLD who presents for follow up. Please see problem based assessment and plan for more information.  Review of Systems General: Denies fatigue, change in appetite and diaphoresis.  Respiratory: Denies SOB  Cardiovascular: Denies chest pain  Gastrointestinal: Denies diarrhea, constipation, blood in stool Neurological: Denies dizziness, headaches, weakness, lightheadedness    Past Medical History  Diagnosis Date  . DIABETES MELLITUS, TYPE II 06/02/2006    Last HBA1C 8.7.   . HYPERLIPIDEMIA 06/02/2006    Qualifier: Diagnosis of  By: Patrice Paradise MD, Roderic Palau    . HYPERTENSION 06/02/2006    Filed Vitals:   01/21/11 1134  BP: 162/98  Pulse: 80  Temp: 97.4 F (36.3 C)  TempSrc: Oral  Resp: 20  Height: _0  (1.727 m)  Weight: 261 lb (118.389 kg)      Outpatient Encounter Prescriptions as of 04/18/2014  Medication Sig  . amLODipine (NORVASC) 10 MG tablet Take 1 tablet (10 mg total) by mouth at bedtime.  Marland Kitchen aspirin 81 MG EC tablet Take 1 tablet (81 mg total) by mouth daily.  . Blood Glucose Monitoring Suppl (WAVESENSE PRESTO) W/DEVICE KIT 1 each by Does not apply route 2 (two) times daily.  . carvedilol (COREG) 25 MG tablet Take 1 tablet (25 mg total) by mouth 2 (two) times daily.  . cloNIDine (CATAPRES) 0.3 MG tablet Take 1 tablet (0.3 mg total) by mouth 2 (two) times daily.  Marland Kitchen glipiZIDE (GLUCOTROL) 10 MG tablet Take 1 tablet (10 mg total) by mouth 2 (two) times daily.  . insulin NPH-regular Human (NOVOLIN 70/30) (70-30) 100 UNIT/ML injection 35 units in the AM and 18 units in the PM 15 minutes prior to food  . Lancets 30G MISC 1 each by Does not apply route 2 (two) times daily.  . metFORMIN (GLUCOPHAGE) 1000 MG tablet Take 1 tablet (1,000 mg total) by mouth 2 (two) times daily.  . Multiple Vitamins-Minerals (CENTRUM) tablet Take 1 tablet by  mouth daily.    . quinapril-hydrochlorothiazide (ACCURETIC) 20-12.5 MG per tablet Take 2 tablets by mouth daily.  . simvastatin (ZOCOR) 40 MG tablet take 1 tablet by mouth once daily    Objective:   Physical Exam Filed Vitals:   04/18/14 1020  BP: 154/82  Pulse: 76  Temp: 97.8 F (36.6 C)  TempSrc: Oral  Height: _1  (1.727 m)  Weight: 271 lb 9.6 oz (123.197 kg)  SpO2: 100%   General: Vital signs reviewed.  Patient is well-developed and well-nourished, in no acute distress and cooperative with exam.  Cardiovascular: RRR Pulmonary/Chest: Clear to auscultation bilaterally Abdominal: Soft, non-tender, non-distended, BS + Extremities: No lower extremity edema bilaterally Skin: Warm, dry and intact. Normal foot exam with 2+ pedal pulses, normal sensation, no wounds.    Assessment & Plan:   Please see problem based assessment and plan.

## 2014-04-18 NOTE — Progress Notes (Signed)
INTERNAL MEDICINE TEACHING ATTENDING ADDENDUM - Aamira Bischoff, MD: I reviewed and discussed at the time of visit with the resident Dr. Richardson, the patient's medical history, physical examination, diagnosis and results of pertinent tests and treatment and I agree with the patient's care as documented.  

## 2014-04-18 NOTE — Assessment & Plan Note (Signed)
Mammogram: Due March 2016, will call if she does not hear from them this month  Colonoscopy: Last one was in 2015  Pap: Due November 2016  Flu: Received 2015  Dentist: May 2016 with Dr. Janice Coffin  Eye Exam: 04/20/14

## 2014-04-18 NOTE — Assessment & Plan Note (Signed)
Lab Results  Component Value Date   HGBA1C 8.7 03/29/2014   HGBA1C 8.7 11/23/2013   HGBA1C 8.7 04/18/2013     Assessment: Diabetes control:  Uncontrolled Progress toward A1C goal:   Stagnant Comments: On 03/29/14, patient continued to have morning hypoglyemic episodes. We decreased 70/30 insulin to 18 units at night. Patient did better with this dose, but noted more hyperglycemic episodes in the afternoon. Patient self-titrated herself back up to 35 units QAM and 22 units QPM. Patient had a hypoglycemic episode at 20 on 04/16/2014. She states she was asymptomatic, drank 2 cups of OJ and resulting CBG was in the 120s.   Plan: Medications:  Continue Meformin 1000 mg BID, continue glipizide 10 units BID, Continue 70/30 35 units QAM and DECREASE 70/30 to 20 units QPM. Patient can adjust by a unit or 2 depending on what her CBG is before dinner. Follow up in 3 months. Consider increasing AM insulin at that time.  Home glucose monitoring: Frequency:  QID Timing:  AC/HS Instruction/counseling given: reminded to get eye exam, reminded to bring blood glucose meter & log to each visit and discussed diet Educational resources provided: brochure Self management tools provided: copy of home glucose meter download Other plans: Provided info on hypoglycemia

## 2014-04-18 NOTE — Assessment & Plan Note (Signed)
Controlled. Continue simvastatin 40 mg daily.

## 2014-04-20 LAB — HM DIABETES EYE EXAM

## 2014-04-21 ENCOUNTER — Encounter: Payer: Self-pay | Admitting: *Deleted

## 2014-05-10 ENCOUNTER — Other Ambulatory Visit: Payer: Self-pay | Admitting: Internal Medicine

## 2014-05-10 DIAGNOSIS — Z1231 Encounter for screening mammogram for malignant neoplasm of breast: Secondary | ICD-10-CM

## 2014-06-06 ENCOUNTER — Ambulatory Visit (HOSPITAL_COMMUNITY)
Admission: RE | Admit: 2014-06-06 | Discharge: 2014-06-06 | Disposition: A | Discharge status: Home / Self Care | Payer: No Typology Code available for payment source | Source: Ambulatory Visit | Attending: Internal Medicine | Admitting: Internal Medicine

## 2014-06-06 DIAGNOSIS — Z1231 Encounter for screening mammogram for malignant neoplasm of breast: Secondary | ICD-10-CM | POA: Insufficient documentation

## 2014-07-18 ENCOUNTER — Telehealth: Payer: Self-pay | Admitting: Internal Medicine

## 2014-07-18 NOTE — Telephone Encounter (Signed)
Call to patient to confirm appointment for 07/19/14 at 2:15 lmtcb

## 2014-07-19 ENCOUNTER — Ambulatory Visit (INDEPENDENT_AMBULATORY_CARE_PROVIDER_SITE_OTHER): Payer: No Typology Code available for payment source | Admitting: Internal Medicine

## 2014-07-19 ENCOUNTER — Encounter: Payer: Self-pay | Admitting: Internal Medicine

## 2014-07-19 VITALS — BP 172/102 | HR 77 | Temp 98.3°F | Ht 68.0 in | Wt 271.6 lb

## 2014-07-19 DIAGNOSIS — I10 Essential (primary) hypertension: Secondary | ICD-10-CM

## 2014-07-19 DIAGNOSIS — E785 Hyperlipidemia, unspecified: Secondary | ICD-10-CM

## 2014-07-19 DIAGNOSIS — E1165 Type 2 diabetes mellitus with hyperglycemia: Secondary | ICD-10-CM

## 2014-07-19 DIAGNOSIS — Z794 Long term (current) use of insulin: Secondary | ICD-10-CM

## 2014-07-19 DIAGNOSIS — E119 Type 2 diabetes mellitus without complications: Secondary | ICD-10-CM

## 2014-07-19 DIAGNOSIS — Z299 Encounter for prophylactic measures, unspecified: Secondary | ICD-10-CM

## 2014-07-19 LAB — GLUCOSE, CAPILLARY: GLUCOSE-CAPILLARY: 148 mg/dL — AB (ref 70–99)

## 2014-07-19 LAB — POCT GLYCOSYLATED HEMOGLOBIN (HGB A1C): HEMOGLOBIN A1C: 8.7

## 2014-07-19 MED ORDER — QUINAPRIL-HYDROCHLOROTHIAZIDE 20-12.5 MG PO TABS: 2.0000 | ORAL_TABLET | Freq: Every day | ORAL | Status: DC

## 2014-07-19 MED ORDER — GLIPIZIDE 10 MG PO TABS: 10.0000 mg | ORAL_TABLET | Freq: Two times a day (BID) | ORAL | Status: DC

## 2014-07-19 MED ORDER — CARVEDILOL 25 MG PO TABS: 25.0000 mg | ORAL_TABLET | Freq: Two times a day (BID) | ORAL | Status: DC

## 2014-07-19 MED ORDER — SIMVASTATIN 40 MG PO TABS: 40.0000 mg | ORAL_TABLET | Freq: Every day | ORAL | Status: DC

## 2014-07-19 MED ORDER — ASPIRIN 81 MG PO TBEC: 81.0000 mg | DELAYED_RELEASE_TABLET | Freq: Every day | ORAL | Status: DC

## 2014-07-19 MED ORDER — CLONIDINE HCL 0.3 MG PO TABS: 0.3000 mg | ORAL_TABLET | Freq: Two times a day (BID) | ORAL | Status: DC

## 2014-07-19 MED ORDER — METFORMIN HCL 1000 MG PO TABS: 1000.0000 mg | ORAL_TABLET | Freq: Two times a day (BID) | ORAL | Status: DC

## 2014-07-19 MED ORDER — AMLODIPINE BESYLATE 10 MG PO TABS: 10.0000 mg | ORAL_TABLET | Freq: Every day | ORAL | Status: DC

## 2014-07-19 MED ORDER — INSULIN NPH ISOPHANE & REGULAR (70-30) 100 UNIT/ML ~~LOC~~ SUSP: SUBCUTANEOUS | Status: DC

## 2014-07-19 NOTE — Patient Instructions (Signed)
General Instructions:   Please bring your medicines with you each time you come to clinic.  Medicines may include prescription medications, over-the-counter medications, herbal remedies, eye drops, vitamins, or other pills.  FOR YOUR DIABETES: TAKE 38 UNITS IN THE MORNING TAKE 18 UNITS AT NIGHT RETURN IN ABOUT 2 WEEKS TO ASSESS GLUCOSE CONTROL CONTINUE ALL OTHER MEDICATIONS AS PRESCRIBED  Diabetes and Exercise Exercising regularly is important. It is not just about losing weight. It has many health benefits, such as:  Improving your overall fitness, flexibility, and endurance.  Increasing your bone density.  Helping with weight control.  Decreasing your body fat.  Increasing your muscle strength.  Reducing stress and tension.  Improving your overall health. People with diabetes who exercise gain additional benefits because exercise:  Reduces appetite.  Improves the body's use of blood sugar (glucose).  Helps lower or control blood glucose.  Decreases blood pressure.  Helps control blood lipids (such as cholesterol and triglycerides).  Improves the body's use of the hormone insulin by:  Increasing the body's insulin sensitivity.  Reducing the body's insulin needs.  Decreases the risk for heart disease because exercising:  Lowers cholesterol and triglycerides levels.  Increases the levels of good cholesterol (such as high-density lipoproteins [HDL]) in the body.  Lowers blood glucose levels. YOUR ACTIVITY PLAN  Choose an activity that you enjoy and set realistic goals. Your health care provider or diabetes educator can help you make an activity plan that works for you. Exercise regularly as directed by your health care provider. This includes:  Performing resistance training twice a week such as push-ups, sit-ups, lifting weights, or using resistance bands.  Performing 150 minutes of cardio exercises each week such as walking, running, or playing  sports.  Staying active and spending no more than 90 minutes at one time being inactive. Even short bursts of exercise are good for you. Three 10-minute sessions spread throughout the day are just as beneficial as a single 30-minute session. Some exercise ideas include:  Taking the dog for a walk.  Taking the stairs instead of the elevator.  Dancing to your favorite song.  Doing an exercise video.  Doing your favorite exercise with a friend. RECOMMENDATIONS FOR EXERCISING WITH TYPE 1 OR TYPE 2 DIABETES   Check your blood glucose before exercising. If blood glucose levels are greater than 240 mg/dL, check for urine ketones. Do not exercise if ketones are present.  Avoid injecting insulin into areas of the body that are going to be exercised. For example, avoid injecting insulin into:  The arms when playing tennis.  The legs when jogging.  Keep a record of:  Food intake before and after you exercise.  Expected peak times of insulin action.  Blood glucose levels before and after you exercise.  The type and amount of exercise you have done.  Review your records with your health care provider. Your health care provider will help you to develop guidelines for adjusting food intake and insulin amounts before and after exercising.  If you take insulin or oral hypoglycemic agents, watch for signs and symptoms of hypoglycemia. They include:  Dizziness.  Shaking.  Sweating.  Chills.  Confusion.  Drink plenty of water while you exercise to prevent dehydration or heat stroke. Body water is lost during exercise and must be replaced.  Talk to your health care provider before starting an exercise program to make sure it is safe for you. Remember, almost any type of activity is better than none. Document Released:  05/24/2003 Document Revised: 07/18/2013 Document Reviewed: 08/10/2012 [facility] Patient Information 2015 [place], [place]. This information is not intended to replace  advice given to you by your health care provider. Make sure you discuss any questions you have with your health care provider.

## 2014-07-19 NOTE — Assessment & Plan Note (Signed)
Compliant with simvastatin 40 mg daily. Denies any myalgias.

## 2014-07-19 NOTE — Assessment & Plan Note (Signed)
Mammogram normal on 06/06/14  Colonoscopy due 2025  Pap due November 2016  Flu due Fall 2016  Dentist appointment on 07/25/14  Eye Exam without retinopathy on 04/20/14

## 2014-07-19 NOTE — Assessment & Plan Note (Signed)
Lab Results  Component Value Date   HGBA1C 8.7 07/19/2014   HGBA1C 8.7 03/29/2014   HGBA1C 8.7 11/23/2013     Assessment: Diabetes control:  Uncontrolled Progress toward A1C goal:   Unchanged Comments: Patient continues to have hypoglycemic episodes in the middle of the night/early morning while on metformin 1000 mg BID, glipizide 10 mg BID, 70/30 35 QAM and 22 QPM. No retinopathy seen on eye exam on 04/20/14.   Plan: Medications:  Increase 70/30 to 38 units QAM, decrease 70/30 to 18 units QPM. Follow up in 2 weeks. Home glucose monitoring: Frequency:  TID-QID Timing:  AC/HS Instruction/counseling given: discussed the need for weight loss and discussed diet Other plans: Check urine microalbumin/creatinine ratio.

## 2014-07-19 NOTE — Progress Notes (Signed)
Subjective:    Patient ID: Ashley Long, female    DOB: December 19, 1960, 54 y.o.   MRN: [id number]  HPI Ms. Pappalardo is a 54 yo female with PMHx of HTN, T2DM, and HLD who presents today for follow up for her diabetes. Please see problem based assessment and plan for more information.  Review of Systems  General: Denies fever, chills, fatigue Respiratory: Denies SOB, cough, DOE Cardiovascular: Denies chest pain and palpitations  Gastrointestinal: Denies nausea, vomiting, abdominal pain, diarrhea, constipation Genitourinary: Denies dysuria, urgency, frequency, hematuria, suprapubic pain and flank pain. Endocrine: Admits to hypoglycemia with diaphoresis. Musculoskeletal: Denies myalgias, back pain Neurological: Denies dizziness, headaches, weakness, lightheadedness  Past Medical History  Diagnosis Date  . DIABETES MELLITUS, TYPE II 06/02/2006    Last HBA1C 8.7.   . HYPERLIPIDEMIA 06/02/2006    Qualifier: Diagnosis of  By: Patrice Paradise MD, Roderic Palau    . HYPERTENSION 06/02/2006    Filed Vitals:   01/21/11 1134  BP: 162/98  Pulse: 80  Temp: 97.4 F (36.3 C)  TempSrc: Oral  Resp: 20  Height: '5\' 8"'  (1.727 m)  Weight: 261 lb (118.389 kg)      Current Outpatient Prescriptions on File Prior to Visit  Medication Sig Dispense Refill  . amLODipine (NORVASC) 10 MG tablet Take 1 tablet (10 mg total) by mouth at bedtime. 30 tablet 11  . aspirin 81 MG EC tablet Take 1 tablet (81 mg total) by mouth daily. 30 tablet 3  . Blood Glucose Monitoring Suppl (WAVESENSE PRESTO) W/DEVICE KIT 1 each by Does not apply route 2 (two) times daily. 1 each 0  . carvedilol (COREG) 25 MG tablet Take 1 tablet (25 mg total) by mouth 2 (two) times daily. 60 tablet 3  . cloNIDine (CATAPRES) 0.3 MG tablet Take 1 tablet (0.3 mg total) by mouth 2 (two) times daily. 180 tablet 4  . glipiZIDE (GLUCOTROL) 10 MG tablet Take 1 tablet (10 mg total) by mouth 2 (two) times daily. 180 tablet 3  . insulin NPH-regular Human (NOVOLIN 70/30) (70-30)  100 UNIT/ML injection 35 units in the AM and 20 units in the PM 15 minutes prior to food 10 mL 11  . Lancets 30G MISC 1 each by Does not apply route 2 (two) times daily. 100 each 12  . metFORMIN (GLUCOPHAGE) 1000 MG tablet Take 1 tablet (1,000 mg total) by mouth 2 (two) times daily. 180 tablet 1  . Multiple Vitamins-Minerals (CENTRUM) tablet Take 1 tablet by mouth daily.      . quinapril-hydrochlorothiazide (ACCURETIC) 20-12.5 MG per tablet Take 2 tablets by mouth daily. 180 tablet 3  . simvastatin (ZOCOR) 40 MG tablet take 1 tablet by mouth once daily 90 tablet 3   No current facility-administered medications on file prior to visit.      Objective:   Physical Exam Filed Vitals:   07/19/14 1424  BP: 172/102  Pulse: 77  Temp: 98.3 F (36.8 C)  TempSrc: Oral  Height: '5\' 8"'  (1.727 m)  Weight: 271 lb 9.6 oz (123.197 kg)  SpO2: 99%   General: Vital signs reviewed.  Patient is well-developed and well-nourished, in no acute distress and cooperative with exam.  Head: Normocephalic and atraumatic. Eyes: EOMI, conjunctivae normal  Neck: Supple, trachea midline, no masses or thyromegaly Cardiovascular: RRR, S1 normal, S2 normal Pulmonary/Chest: Clear to auscultation bilaterally, no wheezes, rales, or rhonchi. Abdominal: Soft, non-tender, non-distended, BS + Extremities: No lower extremity edema bilaterally Neurological: A&O x3, moves all extremities Skin: Warm, dry and  intact. No rashes or erythema. Psychiatric: Normal mood and affect. speech and behavior is normal. Cognition and memory are normal.     Assessment & Plan:    Please see problem based assessment and plan.

## 2014-07-19 NOTE — Assessment & Plan Note (Addendum)
BP Readings from Last 3 Encounters:  07/19/14 172/102  04/18/14 154/82  03/29/14 184/112    Lab Results  Component Value Date   NA 138 11/23/2013   K 3.9 11/23/2013   CREATININE 0.83 11/23/2013    Assessment: Blood pressure control:  Patient always has uncontrolled HTN while in clinic, but very well controlled at home in the 110s-130s/80s range. We have calibrated her monitor with our own and they are similar; therefore, I believe her BP is well controlled at home. Progress toward BP goal:   Stable Comments: Controlled, but on 5 medications (carvedilol 25 mg BID, clonidine 0.3 mg BID, amlodipine 10 mg QD, and quinapril-HCTZ 40-25 mg QD. It does not appear patient has a had work up for secondary causes of HTN in the past. Has had HTN since 75s, no family history.  Plan: Medications:  continue current medications Other plans: Will begin work up for secondary causes of HTN with urine microalbumin/creatinine ratio, renin/aldosterone ratio, and cortisol level. Consider renal ultrasound based on results.

## 2014-07-20 LAB — MICROALBUMIN / CREATININE URINE RATIO
CREATININE, URINE: 89.4 mg/dL
Microalb Creat Ratio: 322.1 mg/g — ABNORMAL HIGH (ref 0.0–30.0)
Microalb, Ur: 28.8 mg/dL — ABNORMAL HIGH (ref <2.0)

## 2014-07-20 LAB — CORTISOL: Cortisol, Plasma: 18 ug/dL

## 2014-07-21 LAB — ALDOSTERONE + RENIN ACTIVITY W/ RATIO
ALDO / PRA Ratio: 0.9 (ref 0.0–30.0)
ALDOSTERONE: 1.6 ng/dL (ref 0.0–30.0)
PRA LC/MS/MS: 1.88 ng/mL/h

## 2014-07-21 NOTE — Progress Notes (Signed)
INTERNAL MEDICINE TEACHING ATTENDING ADDENDUM - Kendyl Festa, MD: I reviewed and discussed at the time of visit with the resident Dr. Richardson, the patient's medical history, physical examination, diagnosis and results of pertinent tests and treatment and I agree with the patient's care as documented.  

## 2014-08-07 ENCOUNTER — Telehealth: Payer: Self-pay | Admitting: Internal Medicine

## 2014-08-07 NOTE — Telephone Encounter (Signed)
Call to patient to confirm appointment for 08/08/14 at 10:30 lmtcb

## 2014-08-08 ENCOUNTER — Other Ambulatory Visit: Payer: Self-pay | Admitting: *Deleted

## 2014-08-08 ENCOUNTER — Ambulatory Visit: Payer: No Typology Code available for payment source

## 2014-08-08 DIAGNOSIS — I10 Essential (primary) hypertension: Secondary | ICD-10-CM

## 2014-08-08 DIAGNOSIS — E119 Type 2 diabetes mellitus without complications: Secondary | ICD-10-CM

## 2014-08-08 MED ORDER — GLIPIZIDE 10 MG PO TABS: 10.0000 mg | ORAL_TABLET | Freq: Two times a day (BID) | ORAL | Status: DC

## 2014-08-08 MED ORDER — INSULIN NPH ISOPHANE & REGULAR (70-30) 100 UNIT/ML ~~LOC~~ SUSP: SUBCUTANEOUS | Status: DC

## 2014-08-08 MED ORDER — CARVEDILOL 25 MG PO TABS: 25.0000 mg | ORAL_TABLET | Freq: Two times a day (BID) | ORAL | Status: DC

## 2014-08-08 MED ORDER — QUINAPRIL-HYDROCHLOROTHIAZIDE 20-12.5 MG PO TABS: 2.0000 | ORAL_TABLET | Freq: Every day | ORAL | Status: DC

## 2014-08-08 MED ORDER — CLONIDINE HCL 0.3 MG PO TABS: 0.3000 mg | ORAL_TABLET | Freq: Two times a day (BID) | ORAL | Status: DC

## 2014-08-08 MED ORDER — METFORMIN HCL 1000 MG PO TABS: 1000.0000 mg | ORAL_TABLET | Freq: Two times a day (BID) | ORAL | Status: DC

## 2014-08-08 NOTE — Telephone Encounter (Signed)
Pt med refills were sent to wrong pharmacy.  Pt goes to [place].  Please resend to mail order.

## 2014-08-16 ENCOUNTER — Ambulatory Visit (INDEPENDENT_AMBULATORY_CARE_PROVIDER_SITE_OTHER): Payer: No Typology Code available for payment source | Admitting: Internal Medicine

## 2014-08-16 ENCOUNTER — Encounter: Payer: Self-pay | Admitting: Internal Medicine

## 2014-08-16 VITALS — BP 182/98 | HR 75 | Temp 98.4°F | Ht 68.0 in | Wt 266.6 lb

## 2014-08-16 DIAGNOSIS — E1165 Type 2 diabetes mellitus with hyperglycemia: Secondary | ICD-10-CM

## 2014-08-16 DIAGNOSIS — E119 Type 2 diabetes mellitus without complications: Secondary | ICD-10-CM

## 2014-08-16 DIAGNOSIS — N63 Unspecified lump in unspecified breast: Secondary | ICD-10-CM

## 2014-08-16 DIAGNOSIS — I10 Essential (primary) hypertension: Secondary | ICD-10-CM

## 2014-08-16 DIAGNOSIS — Z794 Long term (current) use of insulin: Secondary | ICD-10-CM

## 2014-08-16 DIAGNOSIS — Z79899 Other long term (current) drug therapy: Secondary | ICD-10-CM

## 2014-08-16 LAB — GLUCOSE, CAPILLARY: Glucose-Capillary: 79 mg/dL (ref 65–99)

## 2014-08-16 NOTE — Progress Notes (Signed)
Subjective:    Patient ID: Ashley Long, female    DOB: [birth year], 54 y.o.   MRN: [id number]  HPI Ashley Long is a 54 yo F with PMHx of HTN, T2DM, and HLD who presents for follow up for T2DM and HTN. Please see problem based assessment and plan for more information.  Review of Systems General: Denies fever, chills, fatigue  Respiratory: Denies SOB, cough   Cardiovascular: Denies chest pain and palpitations.  Endocrine: Denies hot or cold intolerance, polyuria, and polydipsia. Skin: Admits to lump in right breast.   Neurological: Denies dizziness, headaches  Past Medical History  Diagnosis Date  . DIABETES MELLITUS, TYPE II 06/02/2006    Last HBA1C 8.7.   . HYPERLIPIDEMIA 06/02/2006    Qualifier: Diagnosis of  By: Patrice Paradise MD, Roderic Palau    . HYPERTENSION 06/02/2006    Filed Vitals:   01/21/11 1134  BP: 162/98  Pulse: 80  Temp: 97.4 F (36.3 C)  TempSrc: Oral  Resp: 20  Height: _0  (1.727 m)  Weight: 261 lb (118.389 kg)      Current Outpatient Prescriptions on File Prior to Visit  Medication Sig Dispense Refill  . amLODipine (NORVASC) 10 MG tablet Take 1 tablet (10 mg total) by mouth at bedtime. 30 tablet 11  . aspirin 81 MG EC tablet Take 1 tablet (81 mg total) by mouth daily. 30 tablet 3  . Blood Glucose Monitoring Suppl (WAVESENSE PRESTO) W/DEVICE KIT 1 each by Does not apply route 2 (two) times daily. 1 each 0  . carvedilol (COREG) 25 MG tablet Take 1 tablet (25 mg total) by mouth 2 (two) times daily. 60 tablet 11  . cloNIDine (CATAPRES) 0.3 MG tablet Take 1 tablet (0.3 mg total) by mouth 2 (two) times daily. 180 tablet 3  . glipiZIDE (GLUCOTROL) 10 MG tablet Take 1 tablet (10 mg total) by mouth 2 (two) times daily. 180 tablet 3  . insulin NPH-regular Human (NOVOLIN 70/30) (70-30) 100 UNIT/ML injection 38 units in the AM and 18 units in the PM 15 minutes prior to food 20 mL 4  . Lancets 30G MISC 1 each by Does not apply route 2 (two) times daily. 100 each 12  . metFORMIN  (GLUCOPHAGE) 1000 MG tablet Take 1 tablet (1,000 mg total) by mouth 2 (two) times daily. 180 tablet 3  . Multiple Vitamins-Minerals (CENTRUM) tablet Take 1 tablet by mouth daily.      . quinapril-hydrochlorothiazide (ACCURETIC) 20-12.5 MG per tablet Take 2 tablets by mouth daily. 180 tablet 3  . [DISCONTINUED] simvastatin (ZOCOR) 40 MG tablet Take 1 tablet (40 mg total) by mouth daily. 90 tablet 3   No current facility-administered medications on file prior to visit.      Objective:   Physical Exam Filed Vitals:   08/16/14 1556  BP: 182/98  Pulse: 75  Temp: 98.4 F (36.9 C)  TempSrc: Oral  Height: _1  (1.727 m)  Weight: 266 lb 9.6 oz (120.929 kg)  SpO2: 100%   General: Vital signs reviewed.  Patient is well-developed and well-nourished, in no acute distress and cooperative with exam.  Cardiovascular: RRR, S1 normal, S2 normal Pulmonary/Chest: Clear to auscultation bilaterally, no wheezes, rales, or rhonchi. Abdominal: Soft, non-tender, non-distended, BS + Extremities: No lower extremity edema bilaterally, pulses symmetric and intact bilaterally Skin: 1 cm x 1 cm mobile, non-tender nodule in the superior medial quadrant of right breast.    Assessment & Plan:   Please see problem oriented assessment and plan.

## 2014-08-16 NOTE — Patient Instructions (Signed)
General Instructions:   Please bring your medicines with you each time you come to clinic.  Medicines may include prescription medications, over-the-counter medications, herbal remedies, eye drops, vitamins, or other pills.   Continue taking medications as prescribed. You are doing a great job!  Please let us know if your sugars drop at night consistently.  We have placed a referral for a breast ultrasound and a kidney ultrasound.  Return in 3 months for follow up.   Breast Self-Awareness Practicing breast self-awareness may pick up problems early, prevent significant medical complications, and possibly save your life. By practicing breast self-awareness, you can become familiar with how your breasts look and feel and if your breasts are changing. This allows you to notice changes early. It can also offer you some reassurance that your breast health is good. One way to learn what is normal for your breasts and whether your breasts are changing is to do a breast self-exam. If you find a lump or something that was not present in the past, it is best to contact your caregiver right away. Other findings that should be evaluated by your caregiver include nipple discharge, especially if it is bloody; skin changes or reddening; areas where the skin seems to be pulled in (retracted); or new lumps and bumps. Breast pain is seldom associated with cancer (malignancy), but should also be evaluated by a caregiver. HOW TO PERFORM A BREAST SELF-EXAM The best time to examine your breasts is 5-7 days after your menstrual period is over. During menstruation, the breasts are lumpier, and it may be more difficult to pick up changes. If you do not menstruate, have reached menopause, or had your uterus removed (hysterectomy), you should examine your breasts at regular intervals, such as monthly. If you are breastfeeding, examine your breasts after a feeding or after using a breast pump. Breast implants do not decrease  the risk for lumps or tumors, so continue to perform breast self-exams as recommended. Talk to your caregiver about how to determine the difference between the implant and breast tissue. Also, talk about the amount of pressure you should use during the exam. Over time, you will become more familiar with the variations of your breasts and more comfortable with the exam. A breast self-exam requires you to remove all your clothes above the waist. 1. Look at your breasts and nipples. Stand in front of a mirror in a room with good lighting. With your hands on your hips, push your hands firmly downward. Look for a difference in shape, contour, and size from one breast to the other (asymmetry). Asymmetry includes puckers, dips, or bumps. Also, look for skin changes, such as reddened or scaly areas on the breasts. Look for nipple changes, such as discharge, dimpling, repositioning, or redness. 2. Carefully feel your breasts. This is best done either in the shower or tub while using soapy water or when flat on your back. Place the arm (on the side of the breast you are examining) above your head. Use the pads (not the fingertips) of your three middle fingers on your opposite hand to feel your breasts. Start in the underarm area and use  inch (2 cm) overlapping circles to feel your breast. Use 3 different levels of pressure (light, medium, and firm pressure) at each circle before moving to the next circle. The light pressure is needed to feel the tissue closest to the skin. The medium pressure will help to feel breast tissue a little deeper, while the firm pressure  is needed to feel the tissue close to the ribs. Continue the overlapping circles, moving downward over the breast until you feel your ribs below your breast. Then, move one finger-width towards the center of the body. Continue to use the  inch (2 cm) overlapping circles to feel your breast as you move slowly up toward the collar bone (clavicle) near the base  of the neck. Continue the up and down exam using all 3 pressures until you reach the middle of the chest. Do this with each breast, carefully feeling for lumps or changes. 3.  Keep a written record with breast changes or normal findings for each breast. By writing this information down, you do not need to depend only on memory for size, tenderness, or location. Write down where you are in your menstrual cycle, if you are still menstruating. Breast tissue can have some lumps or thick tissue. However, see your caregiver if you find anything that concerns you.  SEEK MEDICAL CARE IF:  You see a change in shape, contour, or size of your breasts or nipples.   You see skin changes, such as reddened or scaly areas on the breasts or nipples.   You have an unusual discharge from your nipples.   You feel a new lump or unusually thick areas.  Document Released: 03/03/2005 Document Revised: 02/18/2012 Document Reviewed: 06/18/2011 [facility] Patient Information 2015 [place], [place]. This information is not intended to replace advice given to you by your health care provider. Make sure you discuss any questions you have with your health care provider.

## 2014-08-16 NOTE — Assessment & Plan Note (Addendum)
BP Readings from Last 3 Encounters:  08/16/14 182/98  07/19/14 172/102  04/18/14 154/82    Lab Results  Component Value Date   NA 138 11/23/2013   K 3.9 11/23/2013   CREATININE 0.83 11/23/2013    Assessment: Blood pressure control:  Uncontrolled, but controlled at home Progress toward BP goal:   Deteriorated Comments: On carvedilol 25 mg BID, clonidine 0.3 mg daily, amlodipine 10 mg daily, quinapril-HCTZ 40-25 mg daily. Last visit, urine microalbumin/creatinine ratio was elevated, renin/aldosterone ratio was normal, cortisol level was normal. As previously noted, patient's BP is well controlled at home. I have checked her home BP monitor when she comes in and have calibrated with our own blood pressure monitor.   Plan: Medications: Continue current medications Other plans: Will proceed with renal ultrasound for further work up of secondary causes of hypertension given the number of medications the patient requires.

## 2014-08-17 NOTE — Assessment & Plan Note (Signed)
Patient presents with a 1 cm x 1 cm mobile, non-tender nodule in the superior medial quadrant of her right breast. Patient noticed the lump after she bumped her breast and had a resulting bruise. As the bruise improved, she noticed the lump. The lump is non-tender and has gotten smaller over the past month. She had a normal mammogram in March 2016. No personal or family history of breast cancer. Patient states this has happened to her before.  Plan: -Ultrasound of right breast -Diagnostic mammogram of right breast

## 2014-08-17 NOTE — Assessment & Plan Note (Signed)
Lab Results  Component Value Date   HGBA1C 8.7 07/19/2014   HGBA1C 8.7 03/29/2014   HGBA1C 8.7 11/23/2013     Assessment: Diabetes control:  Uncontrolled Progress toward A1C goal:    Stable Comments: Patient returns for evaluation of her nocturnal hypoglycemia. Last visit, 70/30 morning dose was increased to 38 units and nighttime dose was decreased to 18 units. Glipizide 10 mg BID and metformin 1000 mg BID were continued. Afternoon glucose has improved and nighttime hypoglycemic events have improved as well. Previously, patient had lows in the 40-50s. Lowest glucose this time was 69.   Plan: Medications:  continue current medications Home glucose monitoring: Frequency:  BID-TID Instruction/counseling given: reminded to bring blood glucose meter & log to each visit, discussed the need for weight loss and discussed diet Educational resources provided:   Self management tools provided: copy of home glucose meter download Other plans: If patient has repetitive lows in the 60s overnight, patient should call the office and decreased nighttime insulin to 17 units.

## 2014-08-22 ENCOUNTER — Ambulatory Visit
Admission: RE | Admit: 2014-08-22 | Discharge: 2014-08-22 | Disposition: A | Discharge status: Home / Self Care | Payer: No Typology Code available for payment source | Source: Ambulatory Visit | Attending: Internal Medicine | Admitting: Internal Medicine

## 2014-08-22 DIAGNOSIS — N63 Unspecified lump in unspecified breast: Secondary | ICD-10-CM

## 2014-08-22 NOTE — Progress Notes (Signed)
Internal Medicine Clinic Attending  Case discussed with Dr. Richardson at the time of the visit.  We reviewed the resident's history and exam and pertinent patient test results.  I agree with the assessment, diagnosis, and plan of care documented in the resident's note. 

## 2014-09-05 ENCOUNTER — Ambulatory Visit (HOSPITAL_COMMUNITY)
Admission: RE | Admit: 2014-09-05 | Discharge: 2014-09-05 | Disposition: A | Discharge status: Home / Self Care | Payer: No Typology Code available for payment source | Source: Ambulatory Visit | Attending: Internal Medicine | Admitting: Internal Medicine

## 2014-09-05 DIAGNOSIS — I10 Essential (primary) hypertension: Secondary | ICD-10-CM

## 2014-09-05 DIAGNOSIS — N1330 Unspecified hydronephrosis: Secondary | ICD-10-CM | POA: Insufficient documentation

## 2014-09-06 ENCOUNTER — Telehealth: Payer: Self-pay | Admitting: Internal Medicine

## 2014-09-06 NOTE — Telephone Encounter (Signed)
Called patient to inform her of her renal ultrasound results. Patient reports her blood pressure has been well controlled.

## 2014-11-02 ENCOUNTER — Telehealth: Payer: Self-pay | Admitting: *Deleted

## 2014-11-02 NOTE — Telephone Encounter (Signed)
[facility] no longer has accuretic available, they would like to know if you would change it to lisinopril/hctz? Please send a new script to [facility] if you change, thanks

## 2014-11-03 ENCOUNTER — Other Ambulatory Visit: Payer: Self-pay | Admitting: Internal Medicine

## 2014-11-03 MED ORDER — LISINOPRIL-HYDROCHLOROTHIAZIDE 20-12.5 MG PO TABS: 2.0000 | ORAL_TABLET | Freq: Every day | ORAL | Status: DC

## 2014-11-03 NOTE — Telephone Encounter (Signed)
I discontinued quinapril-HCTZ and changed sent in a new rx for lisinopril-HCTZ to [facility].

## 2014-11-10 ENCOUNTER — Other Ambulatory Visit: Payer: Self-pay

## 2014-11-10 NOTE — Telephone Encounter (Signed)
Patient is calling about medication [place], [place] C8/26/20162:16 PM

## 2014-11-13 ENCOUNTER — Other Ambulatory Visit: Payer: Self-pay | Admitting: *Deleted

## 2014-11-13 MED ORDER — GLUCOSE BLOOD VI STRP: ORAL_STRIP | Status: DC

## 2014-11-15 NOTE — Telephone Encounter (Signed)
Refill placed

## 2014-11-21 ENCOUNTER — Telehealth: Payer: Self-pay | Admitting: Dietician

## 2014-11-21 NOTE — Telephone Encounter (Signed)
CDE was asked by [facility] to call patient about her low blood sugars: Ashley Long says she has been trying to eat less, but it is not too much different than her usual intake. She has not changed her activity. She does not know exactly what day and time her blood sugars are from looking back through her meter because her clock is not set. She has decreased her before breakfast insulin to 36 units before breakfast and confirms she is taking 18 units before her dinner around 8 PM. However, she reports having afternoon and fasting hypoglycemia as follows:   Last week- after lunch 45, 57, 172,  62 at 3 pm,  Typical meal pattern is 3 meals a day, times 8AM, 12PM-2PM and 6 PM Takes insulin then eats about 8 am-Cereal and milk 12- takes something to work with YYQ:MGNO or candy  Takes 18 units at 8 pm; Then eats dinner- tuna sub homemade, chips water  No bedtime snack 5 am- 64 this am. 159 at 7 am.  Does endorse signs and symptoms of low blood sugar when these events occur: Fidgety, hot Checks blood sugar as best she can 3 x a day, but Only gets 50 test strips. [employer] only sells 50 at a time, we discussed making more trips vs getting 9$ strips at [employer].   Ashley Long agreed to the following plan and verbalized understanding to it: decreasing her before dinner insulin to 17 units per Dr. Nena Alexander note dated 08/16/14, continue with 36 units Before breakfast and try to eat every 3 hours( example 8AM, 11AM, 2 PM, 5 PM, 8 PM, 11 PM) whether she is hungry or not. Suggested a minimum amount of carb of 15-25 grams at each feeding. Patient agreed to call if she has nay blood sugars < 70 on this regimen.

## 2014-11-30 ENCOUNTER — Telehealth: Payer: Self-pay | Admitting: Dietician

## 2014-11-30 NOTE — Telephone Encounter (Signed)
Patient called to inform us that she had a low blood sugar of 50 mg/dl last night at 4:18 Am that awoke her from her sleep.  She had reduced her insulin last week to 36 units in the am before breakfast and 16 units before dinner. She feels she is eating plenty, breakfast, a snack about noon( peanut butter crackers), then lunch (bologna sandwich and water) 2-3 Pm then dinner about 730- 8 PM. ( 1/2 steak, mac and cheese and a small container of OJ because her sugar was 93 before dinner. She did not check her blood sugar before bed last night.   Patent says she reduced her am insulin to 34 this am, CDE advised her to reduce her Pm dose to 14 tonight as this was the insulin that caused the low blood sugar at 4am. Patient agreed to reduce her insulin doses to 34 and 14 unless she hears otherwise from [place]. CDE confirmed she has an appointment coming up (12-20-14) and told her I would route this note to Dr. Marvel Plan to be sure sje agrees with the new insulin doses.

## 2014-11-30 NOTE — Telephone Encounter (Signed)
I agree. Thank you for talking with her [name].   Ashley Long

## 2014-12-20 ENCOUNTER — Ambulatory Visit (INDEPENDENT_AMBULATORY_CARE_PROVIDER_SITE_OTHER): Payer: No Typology Code available for payment source | Admitting: Internal Medicine

## 2014-12-20 VITALS — BP 156/98 | HR 78 | Temp 97.9°F | Ht 68.0 in | Wt 268.6 lb

## 2014-12-20 DIAGNOSIS — Z23 Encounter for immunization: Secondary | ICD-10-CM

## 2014-12-20 DIAGNOSIS — Z794 Long term (current) use of insulin: Secondary | ICD-10-CM

## 2014-12-20 DIAGNOSIS — E119 Type 2 diabetes mellitus without complications: Secondary | ICD-10-CM

## 2014-12-20 DIAGNOSIS — Z299 Encounter for prophylactic measures, unspecified: Secondary | ICD-10-CM

## 2014-12-20 DIAGNOSIS — I10 Essential (primary) hypertension: Secondary | ICD-10-CM

## 2014-12-20 DIAGNOSIS — Z79899 Other long term (current) drug therapy: Secondary | ICD-10-CM

## 2014-12-20 DIAGNOSIS — N63 Unspecified lump in unspecified breast: Secondary | ICD-10-CM

## 2014-12-20 LAB — GLUCOSE, CAPILLARY: Glucose-Capillary: 141 mg/dL — ABNORMAL HIGH (ref 65–99)

## 2014-12-20 LAB — POCT GLYCOSYLATED HEMOGLOBIN (HGB A1C): Hemoglobin A1C: 7.8

## 2014-12-20 MED ORDER — VARICELLA-ZOSTER IMMUNE GLOB 125 UNIT/1.2ML IM SOLN: 1.0000 "application " | Freq: Once | INTRAMUSCULAR | Status: DC

## 2014-12-20 MED ORDER — INSULIN NPH ISOPHANE & REGULAR (70-30) 100 UNIT/ML ~~LOC~~ SUSP: SUBCUTANEOUS | Status: DC

## 2014-12-20 NOTE — Progress Notes (Signed)
Subjective:    Patient ID: Ashley Long, female    DOB: [birth year], 54 y.o.   MRN: [id number]  HPI Ashley Long is a 54 y.o. female with PMHx of T2DM, HTN who presents to the clinic for follow up for hypoglycemia. Please see A&P for the status of the patient's chronic medical problems.   Patient was previously taking Metformin 1000 mg BID, glipizide 10 BID, and 70/30 36 units in the morning and 18 units at night. She was having hypoglycemic events after lunch such as 45, 57, 172, and 62. Her 70/30 was then reduced to 36 and 17. She reports taking insulin the morning when she wakes up and eating cereal around 8 am, then taking something for lunch to eat, eating subs for dinner. She continued to have hypoglycemic events so her 70/30 was decreased to 34 and 14. She was encouraged to eat every 3 hours with carb counts of 15-25 at each meal. Discussed with pharmacy who recommend d/c glipizide, we may possibly add victoza in the future. Patient reports she is actually taking 70/30 35/15 and her meter reads a low of 61 in the morning on 10/4, and a low of 56 before dinner on 9/28. Otherwise, she has not had any other low readings since 9/22.   Patient states her BP is well controlled at home, she shows me her BP cuff and its electronic readings. In clinic she is 156/98. We compare it to her own cuff which reads 170/92. She does admit to 2 low pressures of 89/58 and 90/60 last night, but no associated symptoms of lightheadedness or dizziness.    Past Medical History  Diagnosis Date  . DIABETES MELLITUS, TYPE II 06/02/2006    Last HBA1C 8.7.   . HYPERLIPIDEMIA 06/02/2006    Qualifier: Diagnosis of  By: Patrice Paradise MD, Roderic Palau    . HYPERTENSION 06/02/2006    Filed Vitals:   01/21/11 1134  BP: 162/98  Pulse: 80  Temp: 97.4 F (36.3 C)  TempSrc: Oral  Resp: 20  Height: '5\' 8"'  (1.727 m)  Weight: 261 lb (118.389 kg)       Outpatient Encounter Prescriptions as of 12/20/2014  Medication Sig  . amLODipine  (NORVASC) 10 MG tablet Take 1 tablet (10 mg total) by mouth at bedtime.  Marland Kitchen aspirin 81 MG EC tablet Take 1 tablet (81 mg total) by mouth daily.  . Blood Glucose Monitoring Suppl (WAVESENSE PRESTO) W/DEVICE KIT 1 each by Does not apply route 2 (two) times daily.  . carvedilol (COREG) 25 MG tablet Take 1 tablet (25 mg total) by mouth 2 (two) times daily.  . cloNIDine (CATAPRES) 0.3 MG tablet Take 1 tablet (0.3 mg total) by mouth 2 (two) times daily.  Marland Kitchen glipiZIDE (GLUCOTROL) 10 MG tablet Take 1 tablet (10 mg total) by mouth 2 (two) times daily.  Marland Kitchen glucose blood (WAVESENSE PRESTO) test strip Use to test blood sugar 4 times daily. diag code E11.9. Insulin dependent  . insulin NPH-regular Human (NOVOLIN 70/30) (70-30) 100 UNIT/ML injection 38 units in the AM and 18 units in the PM 15 minutes prior to food (Patient taking differently: 34 units in the AM and 14 units in the PM 15 minutes prior to food)  . Lancets 30G MISC 1 each by Does not apply route 2 (two) times daily.  Marland Kitchen lisinopril-hydrochlorothiazide (PRINZIDE,ZESTORETIC) 20-12.5 MG per tablet Take 2 tablets by mouth daily.  . metFORMIN (GLUCOPHAGE) 1000 MG tablet Take 1 tablet (1,000 mg total) by mouth  2 (two) times daily.  . Multiple Vitamins-Minerals (CENTRUM) tablet Take 1 tablet by mouth daily.     No facility-administered encounter medications on file as of 12/20/2014.    Family History  Problem Relation Age of Onset  . Other Neg Hx     Social History   Social History  . Marital Status: Single    Spouse Name: N/A  . Number of Children: N/A  . 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 Smoking status: Former Smoker    Quit date: 10/15/1982  . Smokeless tobacco: Not on file  . Alcohol Use: No  . Drug Use: No  . Sexual Activity: Not on file   Other Topics Concern  . Not on file   Social History Narrative   Financial assistance approved for 100% discount at  [facility] and has [facility] card per Bonna Gains   02/12/10         Review of Systems General: Denies fever, chills, fatigue, change in appetite Respiratory: Denies SOB, cough, DOE, chest tightness Cardiovascular: Denies chest pain and palpitations.  Gastrointestinal: Denies nausea, vomiting, abdominal pain Neurological: Denies dizziness, headaches, weakness, lightheadedness    Objective:   Physical Exam Filed Vitals:   12/20/14 1317  BP: 156/98  Pulse: 78  Temp: 97.9 F (36.6 C)  TempSrc: Oral  Height: '5\' 8"'  (1.727 m)  Weight: 268 lb 9.6 oz (121.836 kg)  SpO2: 100%   General: Vital signs reviewed.  Patient is overweight, in no acute distress and cooperative with exam.   Cardiovascular: RRR, S1 normal, S2 normal, no murmurs, gallops, or rubs. Pulmonary/Chest: Clear to auscultation bilaterally, no wheezes, rales, or rhonchi. Abdominal: Soft, non-tender, non-distended, BS + Extremities: No lower extremity edema bilaterally, pulses symmetric and intact bilaterally. No cyanosis or clubbing.  Skin: Warm, dry and intact. No rashes or erythema. Psychiatric: Normal mood and affect. speech and behavior is normal. Cognition and memory are normal.      Assessment & Plan:   Please see problem based assessment and plan.

## 2014-12-20 NOTE — Assessment & Plan Note (Addendum)
BP Readings from Last 3 Encounters:  08/16/14 182/98  07/19/14 172/102  04/18/14 154/82    Lab Results  Component Value Date   NA 138 11/23/2013   K 3.9 11/23/2013   CREATININE 0.83 11/23/2013    Assessment: Blood pressure control:   Progress toward BP goal:     Comments: Controlled at home on clonidine 0.3 mg QD, Coreg 25 mg BID, amlodipine 10 mg daily, quinapril-HCTZ 40-25 mg daily. Pursued further work up of secondary causes of HTN, renal ultrasound showed mild  hydronephrosis of left kidney, but overall normal. Cortisol, renin/aldo normal. Patient states her BP is well controlled at home, she shows me her BP cuff and its electronic readings. In clinic she is 156/98. She does admit to  2 low pressures of 89/58 and 90/60 last night, but no associated symptoms of lightheadedness or dizziness.    Plan: Medications:  continue current medications  Continue to monitor closely.

## 2014-12-20 NOTE — Assessment & Plan Note (Addendum)
Mammogram: Repeat 05/2015 Colonoscopy: Due 03/2013 Pap Smear: Due 01/2015 Flu: Received  Eye Exam: Due 04/2015 Hepatitis C screening: Screened on 10/5 HIV Screening: Screened on 10/5

## 2014-12-20 NOTE — Patient Instructions (Signed)
TAKE 70/30 34 UNITS IN THE MORNING AND 14 UNITS AT NIGHT.  STOP TAKING GLIPIZIDE.  CONTINUE METFORMIN.  FOLLOW UP 2-WEEKS FOR SUGAR CHECK AND PAP SMEAR.

## 2014-12-20 NOTE — Assessment & Plan Note (Addendum)
Lab Results  Component Value Date   HGBA1C 8.7 07/19/2014   HGBA1C 8.7 03/29/2014   HGBA1C 8.7 11/23/2013     Assessment: Diabetes control:   Progress toward A1C goal:     Comments: Patient was previously taking Metformin 1000 mg BID, glipizide 10 BID, and 70/30 36 units in the morning and 18 units at night. She was having hypoglycemic events after lunch such as 45, 57, 172, and 62. Her  70/30 was then reduced to 36 and 17. She reports taking insulin the morning when she wakes up and eating cereal around 8 am, then taking something for lunch to eat, eating subs for dinner. She continued to have  hypoglycemic events so her 70/30 was decreased to 34 and 14. She was encouraged to eat every 3 hours with carb counts of 15-25 at each meal. Discussed with pharmacy who recommend d/c glipizide, we may possibly  add victoza in the future. Patient reports she is actually taking 70/30 35/15 and her meter reads a low of 61 in the morning on 10/4, and a low of 56 before dinner on 9/28. Otherwise, she has not had any other low readings  since 9/22.   Plan: Medications:  Discontinue glipizide. Continue metformin 1000 mg BID. Continue 70/30 at 34 QAM and 14 units QPM. Home glucose monitoring: Frequency:  TID Timing:  AC/HS Instruction/counseling given: reminded to bring blood glucose meter & log to each visit, reminded to bring medications to each visit and discussed diet Other plans: follow up 2 weeks

## 2014-12-20 NOTE — Assessment & Plan Note (Signed)
Mammogram showed ecchymosis with fat necrosis. No follow up recommended. Just repeat screening mammogram in March 2017.  Plan: -Mammogram March 2017

## 2014-12-20 NOTE — Progress Notes (Signed)
Medicine attending: Medical history, presenting problems, physical findings, and medications, reviewed with Dr Alexa Richardson and I concur with her evaluation and management plan. 

## 2014-12-21 LAB — HCV COMMENT:

## 2014-12-21 LAB — HEPATITIS C ANTIBODY (REFLEX): HCV Ab: <0.1 s/co ratio (ref 0.0–0.9)

## 2014-12-21 LAB — HIV ANTIBODY (ROUTINE TESTING W REFLEX): HIV Screen 4th Generation wRfx: NONREACTIVE

## 2015-01-17 ENCOUNTER — Ambulatory Visit: Payer: No Typology Code available for payment source

## 2015-01-17 ENCOUNTER — Ambulatory Visit (INDEPENDENT_AMBULATORY_CARE_PROVIDER_SITE_OTHER): Payer: Self-pay | Admitting: Internal Medicine

## 2015-01-17 ENCOUNTER — Encounter: Payer: Self-pay | Admitting: Internal Medicine

## 2015-01-17 VITALS — BP 142/80 | HR 75 | Temp 98.1°F | Ht 68.0 in | Wt 266.3 lb

## 2015-01-17 DIAGNOSIS — E119 Type 2 diabetes mellitus without complications: Secondary | ICD-10-CM

## 2015-01-17 DIAGNOSIS — Z299 Encounter for prophylactic measures, unspecified: Secondary | ICD-10-CM

## 2015-01-17 DIAGNOSIS — I10 Essential (primary) hypertension: Secondary | ICD-10-CM

## 2015-01-17 DIAGNOSIS — Z7984 Long term (current) use of oral hypoglycemic drugs: Secondary | ICD-10-CM

## 2015-01-17 DIAGNOSIS — E1165 Type 2 diabetes mellitus with hyperglycemia: Secondary | ICD-10-CM

## 2015-01-17 DIAGNOSIS — Z794 Long term (current) use of insulin: Secondary | ICD-10-CM

## 2015-01-17 NOTE — Assessment & Plan Note (Signed)
BP Readings from Last 3 Encounters:  01/17/15 142/80  12/20/14 156/98  08/16/14 182/98    Lab Results  Component Value Date   NA 138 11/23/2013   K 3.9 11/23/2013   CREATININE 0.83 11/23/2013    Assessment: Blood pressure control:  Controlled Progress toward BP goal:   At goal Comments: Compliant with medications  Plan: Medications:  continue current medications Educational resources provided: brochure, handout, video Self management tools provided:

## 2015-01-17 NOTE — Progress Notes (Signed)
Subjective:     Patient ID: Ronaldo Miyamoto, female   DOB: 1960-06-16, 54 y.o.   MRN: [id number]  HPI CARIAH SALATINO is a 54 y.o. female with PMHx of T2DM and HTN who presents to the clinic for follow up for T2DM. Please see A&P for the status of the patient's chronic medical problems.   When patient was last seen, she was having morning hypoglycemic events and occasional hypoglycemic events in the afternoon while on metformin, glipizide and 70/30 36 units QAM and 17 units QPM. We stopped the glipizide and decreased 70/30 to 34 units in the morning and 14 units at night. Patient did well on this, but noticed hyperglycemia so increased her 70/30 to 38 units in the morning and 18 units at night. She has had one true hypoglycemic reading in the morning at 64, and two in the 70s. Other readings are in the 100s with occasional am readings in the 200s.   Past Medical History  Diagnosis Date  . DIABETES MELLITUS, TYPE II 06/02/2006    Last HBA1C 8.7.   . HYPERLIPIDEMIA 06/02/2006    Qualifier: Diagnosis of  By: Patrice Paradise MD, Roderic Palau    . HYPERTENSION 06/02/2006    Filed Vitals:   01/21/11 1134  BP: 162/98  Pulse: 80  Temp: 97.4 F (36.3 C)  TempSrc: Oral  Resp: 20  Height: '5\' 8"'  (1.727 m)  Weight: 261 lb (118.389 kg)       Outpatient Encounter Prescriptions as of 01/17/2015  Medication Sig  . amLODipine (NORVASC) 10 MG tablet Take 1 tablet (10 mg total) by mouth at bedtime.  Marland Kitchen aspirin 81 MG EC tablet Take 1 tablet (81 mg total) by mouth daily.  . Blood Glucose Monitoring Suppl (WAVESENSE PRESTO) W/DEVICE KIT 1 each by Does not apply route 2 (two) times daily.  . carvedilol (COREG) 25 MG tablet Take 1 tablet (25 mg total) by mouth 2 (two) times daily.  . cloNIDine (CATAPRES) 0.3 MG tablet Take 1 tablet (0.3 mg total) by mouth 2 (two) times daily.  Marland Kitchen glucose blood (WAVESENSE PRESTO) test strip Use to test blood sugar 4 times daily. diag code E11.9. Insulin dependent  . insulin NPH-regular Human (NOVOLIN  70/30) (70-30) 100 UNIT/ML injection 34 units in the AM and 14 units in the PM 15 minutes prior to food  . Lancets 30G MISC 1 each by Does not apply route 2 (two) times daily.  Marland Kitchen lisinopril-hydrochlorothiazide (PRINZIDE,ZESTORETIC) 20-12.5 MG per tablet Take 2 tablets by mouth daily.  . metFORMIN (GLUCOPHAGE) 1000 MG tablet Take 1 tablet (1,000 mg total) by mouth 2 (two) times daily.  . Multiple Vitamins-Minerals (CENTRUM) tablet Take 1 tablet by mouth daily.    Durenda Hurt Immune Glob 125 UNIT/1.2ML SOLN Inject 1 application into the muscle once.   No facility-administered encounter medications on file as of 01/17/2015.    Family History  Problem Relation Age of Onset  . Other Neg Hx     Social History   Social History  . Marital Status: Single    Spouse Name: N/A  . Number of Children: N/A  . 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 Smoking status: Former Smoker    Quit date: 10/15/1982  . Smokeless tobacco: Not on file  . Alcohol Use: No  . Drug Use: No  . Sexual Activity: Not on file   Other Topics Concern  . Not on  file   Social History Narrative   Financial assistance approved for 100% discount at [facility] and has [facility] card per [employer]   02/12/10         Review of Systems General: Denies fatigue, change in appetite  Respiratory: Denies SOB, cough   Cardiovascular: Denies chest pain and palpitations.  Gastrointestinal: Denies nausea, vomiting, abdominal pain, diarrhea, constipation Endocrine: Denies polyuria, and polydipsia. Neurological: Denies dizziness, headaches, weakness, lightheadedness, numbness    Objective:   Physical Exam Filed Vitals:   01/17/15 1045  BP: 142/80  Pulse: 75  Temp: 98.1 F (36.7 C)  TempSrc: Oral  Height: '5\' 8"'  (1.727 m)  Weight: 266 lb 4.8 oz (120.793 kg)  SpO2: 100%   General: Vital signs reviewed.  Patient is obese, in no acute distress and  cooperative with exam.   Cardiovascular: RRR, S1 normal, S2 normal, no murmurs, gallops, or rubs. Pulmonary/Chest: Clear to auscultation bilaterally, no wheezes, rales, or rhonchi. Abdominal: Soft, non-tender, non-distended, BS + Pelvic exam: VULVA: normal appearing vulva with no masses, tenderness or lesions, VAGINA: normal appearing vagina with normal color and discharge, no lesions, CERVIX: normal appearing cervix without discharge or lesions, PAP: Pap smear done today, exam chaperoned by Hilda Blades, RN. Extremities: No lower extremity edema bilaterally, pulses symmetric and intact bilaterally. No cyanosis or clubbing.     Assessment:         Plan:     Please see problem based assessment and plan for more information.

## 2015-01-17 NOTE — Assessment & Plan Note (Signed)
Lab Results  Component Value Date   HGBA1C 7.8 12/20/2014   HGBA1C 8.7 07/19/2014   HGBA1C 8.7 03/29/2014     Assessment: Diabetes control:  Uncontrolled, but improving Progress toward A1C goal:   Improving Comments: When patient was last seen, she was having morning hypoglycemic events and occasional hypoglycemic events in the afternoon while on metformin, glipizide and 70/30 36 units QAM and 17 units QPM. We stopped the glipizide and decreased 70/30 to 34 units in the morning and 14 units at night. Patient did well on this, but noticed hyperglycemia so increased her 70/30 to 38 units in the morning and 18 units at night. She has had one true hypoglycemic reading in the morning at 64, and two in the 70s. Other readings are in the 100s with occasional am readings in the 200s.   Plan: Medications:  Continue metformin 1000 BID and 70/30 at 38 units in the morning, 16 units at night. Home glucose monitoring: Frequency:  qid Timing:  ac/hs Instruction/counseling given: discussed foot care and discussed diet Educational resources provided: brochure, handout Self management tools provided: copy of home glucose meter download Other plans: Follow up 3 months

## 2015-01-17 NOTE — Patient Instructions (Signed)
TAKE 70/30 INSULIN 38 UNITS IN THE MORNING AND 16 UNITS AT NIGHT.  DECREASE TWO UNITS AT NIGHT IF YOU ARE WAKING UP WITH SUGARS <70.   RETURN IN 3 MONTHS

## 2015-01-17 NOTE — Assessment & Plan Note (Signed)
Pelvic exam with pap smear completed 01/17/15.

## 2015-01-18 NOTE — Progress Notes (Signed)
Internal Medicine Clinic Attending  Case discussed with Dr. Richardson soon after the resident saw the patient.  We reviewed the resident's history and exam and pertinent patient test results.  I agree with the assessment, diagnosis, and plan of care documented in the resident's note. 

## 2015-01-19 LAB — CYTOLOGY - PAP

## 2015-02-07 ENCOUNTER — Ambulatory Visit: Payer: No Typology Code available for payment source

## 2015-02-13 ENCOUNTER — Telehealth: Payer: Self-pay

## 2015-02-15 ENCOUNTER — Other Ambulatory Visit: Payer: Self-pay

## 2015-02-15 DIAGNOSIS — E119 Type 2 diabetes mellitus without complications: Secondary | ICD-10-CM

## 2015-02-15 MED ORDER — INSULIN NPH ISOPHANE & REGULAR (70-30) 100 UNIT/ML ~~LOC~~ SUSP: SUBCUTANEOUS | Status: DC

## 2015-02-15 NOTE — Telephone Encounter (Signed)
Pt [facility] refills [facility] on her insulin, called to inform, had to leave message.

## 2015-02-15 NOTE — Telephone Encounter (Signed)
Will you please send new rx to [facility], pt no longer using the health [facility]pt.

## 2015-02-16 NOTE — Telephone Encounter (Signed)
rx phoned in, had to LVM for [facility] as recording states they are closed for a holiday?

## 2015-02-23 ENCOUNTER — Other Ambulatory Visit: Payer: Self-pay | Admitting: Internal Medicine

## 2015-02-23 NOTE — Telephone Encounter (Signed)
Pt requesting metformin to be filled.

## 2015-02-26 NOTE — Telephone Encounter (Signed)
Called [facility], this was processed Friday and is in the mail, pt informed

## 2015-02-28 ENCOUNTER — Ambulatory Visit: Payer: No Typology Code available for payment source

## 2015-04-25 ENCOUNTER — Encounter: Payer: Self-pay | Admitting: Internal Medicine

## 2015-04-25 ENCOUNTER — Ambulatory Visit (INDEPENDENT_AMBULATORY_CARE_PROVIDER_SITE_OTHER): Payer: No Typology Code available for payment source | Admitting: Internal Medicine

## 2015-04-25 VITALS — BP 164/98 | HR 72 | Temp 98.8°F | Ht 68.0 in | Wt 260.8 lb

## 2015-04-25 DIAGNOSIS — Z79899 Other long term (current) drug therapy: Secondary | ICD-10-CM

## 2015-04-25 DIAGNOSIS — E1165 Type 2 diabetes mellitus with hyperglycemia: Secondary | ICD-10-CM

## 2015-04-25 DIAGNOSIS — Z794 Long term (current) use of insulin: Secondary | ICD-10-CM

## 2015-04-25 DIAGNOSIS — E785 Hyperlipidemia, unspecified: Secondary | ICD-10-CM

## 2015-04-25 DIAGNOSIS — I10 Essential (primary) hypertension: Secondary | ICD-10-CM

## 2015-04-25 DIAGNOSIS — E119 Type 2 diabetes mellitus without complications: Secondary | ICD-10-CM

## 2015-04-25 LAB — GLUCOSE, CAPILLARY: GLUCOSE-CAPILLARY: 150 mg/dL — AB (ref 65–99)

## 2015-04-25 LAB — POCT GLYCOSYLATED HEMOGLOBIN (HGB A1C): Hemoglobin A1C: 9.1

## 2015-04-25 MED ORDER — SIMVASTATIN 40 MG PO TABS: 40.0000 mg | ORAL_TABLET | Freq: Every day | ORAL | Status: DC

## 2015-04-25 MED ORDER — INSULIN NPH ISOPHANE & REGULAR (70-30) 100 UNIT/ML ~~LOC~~ SUSP: SUBCUTANEOUS | Status: DC

## 2015-04-25 NOTE — Patient Instructions (Signed)
TAKE 70/30 INSULIN 38 UNITS IN THE MORNING AND 18 UNITS AT NIGHT.   FOLLOW UP IN 3 MONTHS.

## 2015-04-25 NOTE — Progress Notes (Signed)
Subjective:    Patient ID: Ashley Long, female    DOB: 1961/02/14, 55 y.o.   MRN: [id number]  HPI Ashley Long is a 55 y.o. female with PMHx of HTN, T2DM, HLD who presents to the clinic for follow up for HTN and T2DM. Please see A&P for the status of the patient's chronic medical problems.   Past Medical History  Diagnosis Date  . DIABETES MELLITUS, TYPE II 06/02/2006    Last HBA1C 8.7.   . HYPERLIPIDEMIA 06/02/2006    Qualifier: Diagnosis of  By: Patrice Paradise MD, Roderic Palau    . HYPERTENSION 06/02/2006    Filed Vitals:   01/21/11 1134  BP: 162/98  Pulse: 80  Temp: 97.4 F (36.3 C)  TempSrc: Oral  Resp: 20  Height: '5\' 8"'  (1.727 m)  Weight: 261 lb (118.389 kg)       Outpatient Encounter Prescriptions as of 04/25/2015  Medication Sig  . amLODipine (NORVASC) 10 MG tablet Take 1 tablet (10 mg total) by mouth at bedtime.  Marland Kitchen aspirin 81 MG EC tablet Take 1 tablet (81 mg total) by mouth daily.  . Blood Glucose Monitoring Suppl (WAVESENSE PRESTO) W/DEVICE KIT 1 each by Does not apply route 2 (two) times daily.  . carvedilol (COREG) 25 MG tablet Take 1 tablet (25 mg total) by mouth 2 (two) times daily.  . cloNIDine (CATAPRES) 0.3 MG tablet Take 1 tablet (0.3 mg total) by mouth 2 (two) times daily.  Marland Kitchen glucose blood (WAVESENSE PRESTO) test strip Use to test blood sugar 4 times daily. diag code E11.9. Insulin dependent  . insulin NPH-regular Human (NOVOLIN 70/30) (70-30) 100 UNIT/ML injection 38 units in the AM and 18 units in the PM 15 minutes prior to food  . Lancets 30G MISC 1 each by Does not apply route 2 (two) times daily.  Marland Kitchen lisinopril-hydrochlorothiazide (PRINZIDE,ZESTORETIC) 20-12.5 MG per tablet Take 2 tablets by mouth daily.  . metFORMIN (GLUCOPHAGE) 1000 MG tablet Take 1 tablet (1,000 mg total) by mouth 2 (two) times daily.  . Multiple Vitamins-Minerals (CENTRUM) tablet Take 1 tablet by mouth daily.    . simvastatin (ZOCOR) 40 MG tablet Take 1 tablet (40 mg total) by mouth daily.  Durenda Hurt Immune Glob 125 UNIT/1.2ML SOLN Inject 1 application into the muscle once.  . [DISCONTINUED] insulin NPH-regular Human (NOVOLIN 70/30) (70-30) 100 UNIT/ML injection 38 units in the AM and 16 units in the PM 15 minutes prior to food   No facility-administered encounter medications on file as of 04/25/2015.    Family History  Problem Relation Age of Onset  . Other Neg Hx     Social History   Social History  . Marital Status: Single    Spouse Name: N/A  . Number of Children: N/A  . 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 Smoking status: Former Smoker    Quit date: 10/15/1982  . Smokeless tobacco: Not on file  . Alcohol Use: No  . Drug Use: No  . Sexual Activity: Not on file   Other Topics Concern  . Not on file   Social History Narrative   Financial assistance approved for 100% discount at [facility] and has [facility] card per Ashley Long   02/12/10         Review of Systems General: Denies fatigue, change in appetite.  Respiratory: Denies SOB, DOE.   Cardiovascular: Denies chest pain and palpitations.  Gastrointestinal: Denies abdominal pain, blood in stool Endocrine: Denies polyuria, and polydipsia.  Neurological: Denies dizziness, headaches, weakness, lightheadedness     Objective:   Physical Exam Filed Vitals:   04/25/15 1510  BP: 164/98  Pulse: 72  Temp: 98.8 F (37.1 C)  TempSrc: Oral  Height: '5\' 8"'  (1.727 m)  Weight: 260 lb 12.8 oz (118.298 kg)  SpO2: 99%   General: Vital signs reviewed.  Patient is well-developed and well-nourished, in no acute distress and cooperative with exam.   Cardiovascular: RRR, S1 normal, S2 normal. Pulmonary/Chest: Clear to auscultation bilaterally, no wheezes, rales, or rhonchi. Abdominal: Soft, non-tender, non-distended, BS + Extremities: No lower extremity edema bilaterally,  pulses symmetric and intact bilaterally. No cyanosis or clubbing. Skin:  Warm, dry and intact. Psychiatric: Normal mood and affect.     Assessment & Plan:   Please see problem based assessment and plan.

## 2015-04-26 NOTE — Assessment & Plan Note (Signed)
Patient reports compliance with simvastatin 40 mg daily.  Plan: -Continue simvastatin 40 mg daily

## 2015-04-26 NOTE — Assessment & Plan Note (Signed)
BP Readings from Last 3 Encounters:  04/25/15 164/98  01/17/15 142/80  12/20/14 156/98    Lab Results  Component Value Date   NA 138 11/23/2013   K 3.9 11/23/2013   CREATININE 0.83 11/23/2013    Assessment: Blood pressure control:  Above goal in office but controlled at home Progress toward BP goal:   Deteriorated, but controlled at home Comments: Patient's BPs are controlled at home based on her home blood pressure cuff readings which has been calibrated with our own machine in the past.   Plan: Medications:  continue current medications Educational resources provided:   Self management tools provided:   Other plans: Continue amlodipine 10 mg daily, Coreg 25 mg BID, clonidine 0.3 mg BID, lisinopril-HCTZ 40-25 mg daily.

## 2015-04-26 NOTE — Assessment & Plan Note (Signed)
Lab Results  Component Value Date   HGBA1C 9.1 04/25/2015   HGBA1C 7.8 12/20/2014   HGBA1C 8.7 07/19/2014     Assessment: Diabetes control:  Uncontrolled Progress toward A1C goal:   Deteriorated Comments: Only taking 70/30 36 QAM and 16 QPM despite plans to increased to 38 units last visit.   Plan: Medications:  Increase 70/30 to 38 QAM and 18 QPM. Home glucose monitoring: Frequency:   Timing:   Instruction/counseling given: discussed the need for weight loss and discussed diet Educational resources provided:   Self management tools provided:   Other plans: Follow up in 3 months

## 2015-05-01 NOTE — Addendum Note (Signed)
Addended by: Gilles Chiquito B on: 05/01/2015 09:20 AM   Modules accepted: Level of Service

## 2015-05-01 NOTE — Progress Notes (Signed)
Internal Medicine Clinic Attending  Case discussed with Dr. Richardson soon after the resident saw the patient.  We reviewed the resident's history and exam and pertinent patient test results.  I agree with the assessment, diagnosis, and plan of care documented in the resident's note. 

## 2015-05-04 ENCOUNTER — Emergency Department (HOSPITAL_COMMUNITY): Payer: No Typology Code available for payment source

## 2015-05-04 ENCOUNTER — Emergency Department (HOSPITAL_COMMUNITY)
Admission: EM | Admit: 2015-05-04 | Discharge: 2015-05-04 | Disposition: A | Discharge status: Home / Self Care | Payer: No Typology Code available for payment source | Attending: Emergency Medicine | Admitting: Emergency Medicine

## 2015-05-04 ENCOUNTER — Encounter (HOSPITAL_COMMUNITY): Payer: Self-pay | Admitting: Emergency Medicine

## 2015-05-04 DIAGNOSIS — Z79899 Other long term (current) drug therapy: Secondary | ICD-10-CM | POA: Insufficient documentation

## 2015-05-04 DIAGNOSIS — Z3202 Encounter for pregnancy test, result negative: Secondary | ICD-10-CM | POA: Insufficient documentation

## 2015-05-04 DIAGNOSIS — I10 Essential (primary) hypertension: Secondary | ICD-10-CM | POA: Insufficient documentation

## 2015-05-04 DIAGNOSIS — E119 Type 2 diabetes mellitus without complications: Secondary | ICD-10-CM | POA: Insufficient documentation

## 2015-05-04 DIAGNOSIS — E785 Hyperlipidemia, unspecified: Secondary | ICD-10-CM | POA: Insufficient documentation

## 2015-05-04 DIAGNOSIS — Z7984 Long term (current) use of oral hypoglycemic drugs: Secondary | ICD-10-CM | POA: Insufficient documentation

## 2015-05-04 DIAGNOSIS — R112 Nausea with vomiting, unspecified: Secondary | ICD-10-CM

## 2015-05-04 DIAGNOSIS — R05 Cough: Secondary | ICD-10-CM | POA: Insufficient documentation

## 2015-05-04 DIAGNOSIS — Z794 Long term (current) use of insulin: Secondary | ICD-10-CM | POA: Insufficient documentation

## 2015-05-04 DIAGNOSIS — N390 Urinary tract infection, site not specified: Secondary | ICD-10-CM | POA: Insufficient documentation

## 2015-05-04 DIAGNOSIS — Z7982 Long term (current) use of aspirin: Secondary | ICD-10-CM | POA: Insufficient documentation

## 2015-05-04 DIAGNOSIS — IMO0001 Reserved for inherently not codable concepts without codable children: Secondary | ICD-10-CM

## 2015-05-04 DIAGNOSIS — Z87891 Personal history of nicotine dependence: Secondary | ICD-10-CM | POA: Insufficient documentation

## 2015-05-04 LAB — COMPREHENSIVE METABOLIC PANEL
ALK PHOS: 62 U/L (ref 38–126)
ALT: 41 U/L (ref 14–54)
ANION GAP: 11 (ref 5–15)
AST: 43 U/L — ABNORMAL HIGH (ref 15–41)
Albumin: 3.4 g/dL — ABNORMAL LOW (ref 3.5–5.0)
BILIRUBIN TOTAL: 0.6 mg/dL (ref 0.3–1.2)
BUN: 17 mg/dL (ref 6–20)
CALCIUM: 8.8 mg/dL — AB (ref 8.9–10.3)
CO2: 23 mmol/L (ref 22–32)
Chloride: 105 mmol/L (ref 101–111)
Creatinine, Ser: 1.04 mg/dL — ABNORMAL HIGH (ref 0.44–1.00)
GLUCOSE: 116 mg/dL — AB (ref 65–99)
Potassium: 3.7 mmol/L (ref 3.5–5.1)
Sodium: 139 mmol/L (ref 135–145)
TOTAL PROTEIN: 6.1 g/dL — AB (ref 6.5–8.1)

## 2015-05-04 LAB — CBC
HCT: 36.3 % (ref 36.0–46.0)
HEMOGLOBIN: 12.1 g/dL (ref 12.0–15.0)
MCH: 29 pg (ref 26.0–34.0)
MCHC: 33.3 g/dL (ref 30.0–36.0)
MCV: 87.1 fL (ref 78.0–100.0)
Platelets: 220 10*3/uL (ref 150–400)
RBC: 4.17 MIL/uL (ref 3.87–5.11)
RDW: 12.9 % (ref 11.5–15.5)
WBC: 4.5 10*3/uL (ref 4.0–10.5)

## 2015-05-04 LAB — URINE MICROSCOPIC-ADD ON

## 2015-05-04 LAB — URINALYSIS, ROUTINE W REFLEX MICROSCOPIC
Bilirubin Urine: NEGATIVE
Glucose, UA: NEGATIVE mg/dL
Ketones, ur: NEGATIVE mg/dL
NITRITE: POSITIVE — AB
PROTEIN: 100 mg/dL — AB
Specific Gravity, Urine: 1.019 (ref 1.005–1.030)
pH: 5 (ref 5.0–8.0)

## 2015-05-04 LAB — LIPASE, BLOOD: LIPASE: 28 U/L (ref 11–51)

## 2015-05-04 LAB — PREGNANCY, URINE: PREG TEST UR: NEGATIVE

## 2015-05-04 MED ORDER — ONDANSETRON 8 MG PO TBDP: 8.0000 mg | ORAL_TABLET | Freq: Three times a day (TID) | ORAL | Status: DC | PRN

## 2015-05-04 MED ORDER — SODIUM CHLORIDE 0.9 % IV BOLUS (SEPSIS)
1000.0000 mL | Freq: Once | INTRAVENOUS | Status: AC
  Administered 2015-05-04: 1000 mL via INTRAVENOUS

## 2015-05-04 MED ORDER — ONDANSETRON HCL 4 MG/2ML IJ SOLN
4.0000 mg | Freq: Once | INTRAMUSCULAR | Status: AC
  Administered 2015-05-04: 4 mg via INTRAVENOUS
  Filled 2015-05-04: qty 2

## 2015-05-04 MED ORDER — SODIUM CHLORIDE 0.9 % IV BOLUS (SEPSIS)
500.0000 mL | Freq: Once | INTRAVENOUS | Status: AC
Start: 2015-05-04 — End: 2015-05-04
  Administered 2015-05-04: 500 mL via INTRAVENOUS

## 2015-05-04 MED ORDER — LORAZEPAM 2 MG/ML IJ SOLN
0.5000 mg | Freq: Once | INTRAMUSCULAR | Status: AC
  Administered 2015-05-04: 0.5 mg via INTRAVENOUS
  Filled 2015-05-04: qty 1

## 2015-05-04 MED ORDER — CEPHALEXIN 500 MG PO CAPS: 500.0000 mg | ORAL_CAPSULE | Freq: Four times a day (QID) | ORAL | Status: DC

## 2015-05-04 MED ORDER — CEPHALEXIN 250 MG PO CAPS
500.0000 mg | ORAL_CAPSULE | Freq: Once | ORAL | Status: AC
  Administered 2015-05-04: 500 mg via ORAL
  Filled 2015-05-04: qty 2

## 2015-05-04 NOTE — ED Notes (Signed)
Pt has been feeling nauseous and vomiting for a week. Pt states she can barely keep down soup. Pt has been coughing, pt denies fever. Pt states her stomach feels uncomfortable.

## 2015-05-04 NOTE — Discharge Instructions (Signed)
It was our pleasure to provide your ER care today - we hope that you feel better.  Rest. Drink plenty of fluids.  You may take zofran as need for nausea.  The lab tests show a possible urinary infection - take antibiotic as prescribed.   Follow up with your doctor in the next few days if symptoms fail to improve/resolve.  Return to ER if worse, new symptoms, fevers, persistent vomiting, severe pain, other concern.  Your were given medications in the ER to improve your symptoms - no driving for the next 4 hours.      Nausea and Vomiting Nausea is a sick feeling that often comes before throwing up (vomiting). Vomiting is a reflex where stomach contents come out of your mouth. Vomiting can cause severe loss of body fluids (dehydration). Children and elderly adults can become dehydrated quickly, especially if they also have diarrhea. Nausea and vomiting are symptoms of a condition or disease. It is important to find the cause of your symptoms. CAUSES   Direct irritation of the stomach lining. This irritation can result from increased acid production (gastroesophageal reflux disease), infection, food poisoning, taking certain medicines (such as nonsteroidal anti-inflammatory drugs), alcohol use, or tobacco use.  Signals from the brain.These signals could be caused by a headache, heat exposure, an inner ear disturbance, increased pressure in the brain from injury, infection, a tumor, or a concussion, pain, emotional stimulus, or metabolic problems.  An obstruction in the gastrointestinal tract (bowel obstruction).  Illnesses such as diabetes, hepatitis, gallbladder problems, appendicitis, kidney problems, cancer, sepsis, atypical symptoms of a heart attack, or eating disorders.  Medical treatments such as chemotherapy and radiation.  Receiving medicine that makes you sleep (general anesthetic) during surgery. DIAGNOSIS Your caregiver may ask for tests to be done if the problems do not  improve after a few days. Tests may also be done if symptoms are severe or if the reason for the nausea and vomiting is not clear. Tests may include:  Urine tests.  Blood tests.  Stool tests.  Cultures (to look for evidence of infection).  X-rays or other imaging studies. Test results can help your caregiver make decisions about treatment or the need for additional tests. TREATMENT You need to stay well hydrated. Drink frequently but in small amounts.You may wish to drink water, sports drinks, clear broth, or eat frozen ice pops or gelatin dessert to help stay hydrated.When you eat, eating slowly may help prevent nausea.There are also some antinausea medicines that may help prevent nausea. HOME CARE INSTRUCTIONS   Take all medicine as directed by your caregiver.  If you do not have an appetite, do not force yourself to eat. However, you must continue to drink fluids.  If you have an appetite, eat a normal diet unless your caregiver tells you differently.  Eat a variety of complex carbohydrates (rice, wheat, potatoes, bread), lean meats, yogurt, fruits, and vegetables.  Avoid high-fat foods because they are more difficult to digest.  Drink enough water and fluids to keep your urine clear or pale yellow.  If you are dehydrated, ask your caregiver for specific rehydration instructions. Signs of dehydration may include:  Severe thirst.  Dry lips and mouth.  Dizziness.  Dark urine.  Decreasing urine frequency and amount.  Confusion.  Rapid breathing or pulse. SEEK IMMEDIATE MEDICAL CARE IF:   You have blood or brown flecks (like coffee grounds) in your vomit.  You have black or bloody stools.  You have a severe headache or  stiff neck.  You are confused.  You have severe abdominal pain.  You have chest pain or trouble breathing.  You do not urinate at least once every 8 hours.  You develop cold or clammy skin.  You continue to vomit for longer than 24 to 48  hours.  You have a fever. MAKE SURE YOU:   Understand these instructions.  Will watch your condition.  Will get help right away if you are not doing well or get worse.   This information is not intended to replace advice given to you by your health care provider. Make sure you discuss any questions you have with your health care provider.   Document Released: 03/03/2005 Document Revised: 05/26/2011 Document Reviewed: 07/31/2010 Elsevier Interactive Patient Education [employer].

## 2015-05-04 NOTE — ED Provider Notes (Signed)
CSN: [id number]     Arrival date & time 05/04/15  0725 History   First MD Initiated Contact with Patient 05/04/15 [phone]     Chief Complaint  Patient presents with  . Nausea     (Consider location/radiation/quality/duration/timing/severity/associated sxs/prior Treatment) The history is provided by the patient.  Patient c/o nv in the past week. Few episodes per day. Emesis not bloody or bilious. Having normal bms. No abd distension. Mild epigastric pain. No fevers or chills. +increasing coughing in past week. No sore throat or throat swelling. Denies headache. No known ill contacts. No recent change in meds or new meds.       Past Medical History  Diagnosis Date  . DIABETES MELLITUS, TYPE II 06/02/2006    Last HBA1C 8.7.   . HYPERLIPIDEMIA 06/02/2006    Qualifier: Diagnosis of  By: Patrice Paradise MD, Roderic Palau    . HYPERTENSION 06/02/2006    Filed Vitals:   01/21/11 1134  BP: 162/98  Pulse: 80  Temp: 97.4 F (36.3 C)  TempSrc: Oral  Resp: 20  Height: '5\' 8"'  (1.727 m)  Weight: 261 lb (118.389 kg)      Past Surgical History  Procedure Laterality Date  . Tubal ligation    . Colonoscopy N/A 03/29/2013    Procedure: COLONOSCOPY;  Surgeon: Lear Ng, MD;  Location: [facility]COPY;  Service: Endoscopy;  Laterality: N/A;   Family History  Problem Relation Age of Onset  . Other Neg Hx    Social History  Substance Use Topics  . Smoking status: Former Smoker    Quit date: 10/15/1982  . Smokeless tobacco: None  . Alcohol Use: No   OB History    Gravida Para Term Preterm AB TAB SAB Ectopic Multiple Living   '1 1 1       1     ' Review of Systems  Constitutional: Negative for fever and chills.  HENT: Negative for sore throat.   Eyes: Negative for redness.  Respiratory: Positive for cough. Negative for shortness of breath.   Cardiovascular: Negative for chest pain and leg swelling.  Gastrointestinal: Positive for nausea and vomiting. Negative for abdominal pain, diarrhea and constipation.   Genitourinary: Negative for dysuria and flank pain.  Musculoskeletal: Negative for back pain and neck pain.  Skin: Negative for rash.  Neurological: Negative for headaches.  Hematological: Does not bruise/bleed easily.  Psychiatric/Behavioral: Negative for confusion.      Allergies  Review of patient's allergies indicates no known allergies.  Home Medications   Prior to Admission medications   Medication Sig Start Date End Date Taking? Authorizing Provider  amLODipine (NORVASC) 10 MG tablet Take 1 tablet (10 mg total) by mouth at bedtime. 07/19/14   Alexa Sherral Hammers, MD  aspirin 81 MG EC tablet Take 1 tablet (81 mg total) by mouth daily. 07/19/14   Alexa Sherral Hammers, MD  Blood Glucose Monitoring Suppl (WAVESENSE PRESTO) W/DEVICE KIT 1 each by Does not apply route 2 (two) times daily. 10/17/10   Janell Quiet, MD  carvedilol (COREG) 25 MG tablet Take 1 tablet (25 mg total) by mouth 2 (two) times daily. 08/08/14   Oval Linsey, MD  cloNIDine (CATAPRES) 0.3 MG tablet Take 1 tablet (0.3 mg total) by mouth 2 (two) times daily. 08/08/14   Oval Linsey, MD  glucose blood (WAVESENSE PRESTO) test strip Use to test blood sugar 4 times daily. 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 10/17/10   Janell Quiet, MD  lisinopril-hydrochlorothiazide (PRINZIDE,ZESTORETIC) 20-12.5 MG per tablet Take 2 tablets by mouth daily. 11/03/14   Alexa Sherral Hammers, MD  metFORMIN (GLUCOPHAGE) 1000 MG tablet Take 1 tablet (1,000 mg total) by mouth 2 (two) times daily. 08/08/14   Oval Linsey, MD  Multiple Vitamins-Minerals (CENTRUM) tablet Take 1 tablet by mouth daily.      Historical Provider, MD  simvastatin (ZOCOR) 40 MG tablet Take 1 tablet (40 mg total) by  mouth daily. 04/25/15   Alexa Sherral Hammers, MD  Varicella-Zoster Immune Glob 125 UNIT/1.2ML SOLN Inject 1 application into the muscle once. 12/20/14   Alexa Sherral Hammers, MD   BP 153/90 mmHg  Pulse 74  Temp(Src) 98 F (36.7 C) (Oral)  Resp 16  SpO2 100%  LMP 04/29/2015 Physical Exam  Constitutional: She appears well-developed and well-nourished. No distress.  HENT:  Mouth/Throat: Oropharynx is clear and moist.  Eyes: Conjunctivae are normal. Pupils are equal, round, and reactive to light. No scleral icterus.  Neck: Normal range of motion. Neck supple. No tracheal deviation present.  No stiffness or rigidity  Cardiovascular: Normal rate, regular rhythm, normal heart sounds and intact distal pulses.  Exam reveals no gallop and no friction rub.   No murmur heard. Pulmonary/Chest: Effort normal and breath sounds normal. No respiratory distress.  Abdominal: Soft. Normal appearance and bowel sounds are normal. She exhibits no distension and no mass. There is no tenderness. There is no rebound and no guarding.  Genitourinary:  No cva tenderness  Musculoskeletal: She exhibits no edema.  Neurological: She is alert.  Skin: Skin is warm and dry. No rash noted. She is not diaphoretic.  Psychiatric: She has a normal mood and affect.  Nursing note and vitals reviewed.   ED Course  Procedures (including critical care time) Labs Review   Results for orders placed or performed during the hospital encounter of 05/04/15  Comprehensive metabolic panel  Result Value Ref Range   Sodium 139 135 - 145 mmol/L   Potassium 3.7 3.5 - 5.1 mmol/L   Chloride 105 101 - 111 mmol/L   CO2 23 22 - 32 mmol/L   Glucose, Bld 116 (H) 65 - 99 mg/dL   BUN 17 6 - 20 mg/dL   Creatinine, Ser 1.04 (H) 0.44 - 1.00 mg/dL   Calcium 8.8 (L) 8.9 - 10.3 mg/dL   Total Protein 6.1 (L) 6.5 - 8.1 g/dL   Albumin 3.4 (L) 3.5 - 5.0 g/dL   AST 43 (H) 15 - 41 U/L   ALT 41 14 - 54 U/L   Alkaline Phosphatase 62 38 - 126 U/L    Total Bilirubin 0.6 0.3 - 1.2 mg/dL   GFR calc non Af Amer >60 >60 mL/min   GFR calc Af Amer >60 >60 mL/min   Anion gap 11 5 - 15  CBC  Result Value Ref Range   WBC 4.5 4.0 - 10.5 K/uL   RBC 4.17 3.87 - 5.11 MIL/uL   Hemoglobin 12.1 12.0 - 15.0 g/dL   HCT 36.3 36.0 - 46.0 %   MCV 87.1 78.0 - 100.0 fL   MCH 29.0 26.0 - 34.0 pg   MCHC 33.3 30.0 - 36.0 g/dL   RDW 12.9 11.5 - 15.5 %  Platelets 220 150 - 400 K/uL  Lipase, blood  Result Value Ref Range   Lipase 28 11 - 51 U/L  Urinalysis, Routine w reflex microscopic (not at [facility])  Result Value Ref Range   Color, Urine YELLOW YELLOW   APPearance CLOUDY (A) CLEAR   Specific Gravity, Urine 1.019 1.005 - 1.030   pH 5.0 5.0 - 8.0   Glucose, UA NEGATIVE NEGATIVE mg/dL   Hgb urine dipstick LARGE (A) NEGATIVE   Bilirubin Urine NEGATIVE NEGATIVE   Ketones, ur NEGATIVE NEGATIVE mg/dL   Protein, ur 100 (A) NEGATIVE mg/dL   Nitrite POSITIVE (A) NEGATIVE   Leukocytes, UA SMALL (A) NEGATIVE  Pregnancy, urine  Result Value Ref Range   Preg Test, Ur NEGATIVE NEGATIVE  Urine microscopic-add on  Result Value Ref Range   Squamous Epithelial / LPF 0-5 (A) NONE SEEN   WBC, UA 6-30 0 - 5 WBC/hpf   RBC / HPF 6-30 0 - 5 RBC/hpf   Bacteria, UA MANY (A) NONE SEEN   Dg Chest 2 View  05/04/2015  CLINICAL DATA:  One week history of cough and vomiting. Current history of diabetes and hypertension. EXAM: CHEST  2 VIEW COMPARISON:  01/17/2005. FINDINGS: Cardiac silhouette upper normal in size, unchanged. Hilar and mediastinal contours unremarkable. Lungs clear. Bronchovascular markings normal. Pulmonary vascularity normal. No visible pleural effusions. No pneumothorax. Mild degenerative changes involving the lower thoracic spine. IMPRESSION: No acute cardiopulmonary disease. Electronically Signed   By: Evangeline Dakin M.D.   On: 05/04/2015 08:30      I have personally reviewed and evaluated these images and lab results as part of my medical  decision-making.    MDM   Iv ns. zofran iv.  Reviewed nursing notes and prior charts for additional history.   Possible uti on labs w many bacteria, 6-30 wbc.  Will rx.  Pt tolerating po fluids, and crackers.   abd soft nt.   Afeb.  Pt feels improved. No pain. No nv.  Pt currently appears stable for d/c.       Lajean Saver, MD 05/04/15 [phone]

## 2015-05-04 NOTE — ED Notes (Signed)
Gave pt diet gingerale for po challenge

## 2015-05-14 ENCOUNTER — Other Ambulatory Visit: Payer: Self-pay

## 2015-05-14 DIAGNOSIS — E119 Type 2 diabetes mellitus without complications: Secondary | ICD-10-CM

## 2015-05-14 DIAGNOSIS — Z794 Long term (current) use of insulin: Secondary | ICD-10-CM

## 2015-05-14 DIAGNOSIS — I10 Essential (primary) hypertension: Secondary | ICD-10-CM

## 2015-05-14 NOTE — Telephone Encounter (Signed)
Patient needs her blood pressure medicines called in to [facility]

## 2015-05-16 ENCOUNTER — Telehealth: Payer: Self-pay | Admitting: Internal Medicine

## 2015-05-16 MED ORDER — AMLODIPINE BESYLATE 10 MG PO TABS: 10.0000 mg | ORAL_TABLET | Freq: Every day | ORAL | Status: DC

## 2015-05-16 MED ORDER — CARVEDILOL 25 MG PO TABS: 25.0000 mg | ORAL_TABLET | Freq: Two times a day (BID) | ORAL | Status: DC

## 2015-05-16 MED ORDER — "INSULIN SYRINGE-NEEDLE U-100 31G X 15/64"" 0.3 ML MISC": 1.0000 | Freq: Two times a day (BID) | Status: DC

## 2015-05-16 MED ORDER — INSULIN NPH ISOPHANE & REGULAR (70-30) 100 UNIT/ML ~~LOC~~ SUSP: SUBCUTANEOUS | Status: DC

## 2015-05-16 MED ORDER — GLUCOSE BLOOD VI STRP: ORAL_STRIP | Status: DC

## 2015-05-16 MED ORDER — ASPIRIN 81 MG PO TBEC: 81.0000 mg | DELAYED_RELEASE_TABLET | Freq: Every day | ORAL | Status: DC

## 2015-05-16 MED ORDER — LISINOPRIL-HYDROCHLOROTHIAZIDE 20-12.5 MG PO TABS: 2.0000 | ORAL_TABLET | Freq: Every day | ORAL | Status: DC

## 2015-05-16 MED ORDER — CLONIDINE HCL 0.3 MG PO TABS: 0.3000 mg | ORAL_TABLET | Freq: Two times a day (BID) | ORAL | Status: DC

## 2015-05-16 MED ORDER — METFORMIN HCL 1000 MG PO TABS: 1000.0000 mg | ORAL_TABLET | Freq: Two times a day (BID) | ORAL | Status: DC

## 2015-05-16 NOTE — Telephone Encounter (Signed)
Pt has been informed her request was rec'd and we are witing on the physician, 48 hours per policy.

## 2015-05-16 NOTE — Telephone Encounter (Signed)
Spoke with Darl Householder.  Dr. Marvel Plan will you send script for syringes to [facility]?

## 2015-05-16 NOTE — Telephone Encounter (Signed)
Sent rx for syringes to [facility].

## 2015-05-16 NOTE — Telephone Encounter (Signed)
Yao Pharmacist from [facility] [phone]) would like a call back regarding patient's Insulin medication

## 2015-05-16 NOTE — Telephone Encounter (Signed)
Medications refilled and sent to [facility]. You indicated that BP medications should go to [facility], but ASA, metformin, and 70/30 were also requested to be refilled which I did. This medications were sent to [facility] as well. Were these supposed to go to a different pharmacy such as [facility]?

## 2015-05-17 ENCOUNTER — Telehealth: Payer: Self-pay

## 2015-05-17 ENCOUNTER — Other Ambulatory Visit: Payer: Self-pay

## 2015-05-17 DIAGNOSIS — Z1231 Encounter for screening mammogram for malignant neoplasm of breast: Secondary | ICD-10-CM

## 2015-05-17 NOTE — Telephone Encounter (Signed)
Patient went to [facility] to medications one she will be able to get,but one of the medicines was $75 and the other one was 42$ just the blood pressure medication,patient is aking to have the 2 blood [facility]

## 2015-05-17 NOTE — Telephone Encounter (Signed)
i am informed that these 2 meds were clonidine0.3mg  and lisinopril/ hctz mix 20/12.5mg . i was also told [facility] has [facility] and to see if she can get it there, i called [facility], they will fill the meds today, pt could get all her meds there w/ [facility] assisting. This would be a way to have 1 pharm instead of trying to remember where each med is at.

## 2015-05-31 ENCOUNTER — Ambulatory Visit (INDEPENDENT_AMBULATORY_CARE_PROVIDER_SITE_OTHER): Payer: No Typology Code available for payment source | Admitting: Internal Medicine

## 2015-05-31 ENCOUNTER — Encounter: Payer: Self-pay | Admitting: Internal Medicine

## 2015-05-31 VITALS — BP 130/82 | HR 74 | Temp 98.3°F | Ht 68.0 in | Wt 252.0 lb

## 2015-05-31 DIAGNOSIS — I10 Essential (primary) hypertension: Secondary | ICD-10-CM

## 2015-05-31 DIAGNOSIS — R112 Nausea with vomiting, unspecified: Secondary | ICD-10-CM

## 2015-05-31 DIAGNOSIS — R197 Diarrhea, unspecified: Secondary | ICD-10-CM

## 2015-05-31 MED ORDER — HYDROCHLOROTHIAZIDE 12.5 MG PO TABS: 12.5000 mg | ORAL_TABLET | Freq: Every day | ORAL | Status: DC

## 2015-05-31 MED ORDER — PANTOPRAZOLE SODIUM 40 MG PO TBEC: 40.0000 mg | DELAYED_RELEASE_TABLET | Freq: Every day | ORAL | Status: DC

## 2015-05-31 MED ORDER — ONDANSETRON HCL 4 MG PO TABS: 4.0000 mg | ORAL_TABLET | Freq: Two times a day (BID) | ORAL | Status: DC | PRN

## 2015-05-31 NOTE — Assessment & Plan Note (Signed)
BP Readings from Last 3 Encounters:  05/31/15 130/82  05/04/15 131/75  04/25/15 164/98    Lab Results  Component Value Date   NA 139 05/04/2015   K 3.7 05/04/2015   CREATININE 1.04* 05/04/2015    Assessment: Blood pressure control:  fair Progress toward BP goal:    Comments: patient reports new onset cough after starting Lisinopril.  She is very confident it is from the Lisinopril  Plan: Medications:  Stop Lisinopril.  Continue HCTZ 12.5 mg daily.   Educational resources provided:   Self management tools provided:   Other plans: Patient should be started on ARB at some point in future given her DMII, but will wait for resolution of cough.

## 2015-05-31 NOTE — Assessment & Plan Note (Signed)
A/P: Etiology of N/V/D unclear, but ddx includes viral gastroenteritis, GERD/PUD, and diabetic gastroparesis.  Her time course is a little long for viral gastro, but it is not unheard of.  Given her dysphagia, GERD is certainly possible and likely.  Gastroparesis less likely without abdominal pain or association with food.  Will try two weeks of symptomatic therapy with PPI and re-evaluate patient.  If viral gastro, it will run its course.  If no improvement in 2 weeks, will refer to GI for eval GERD vs gastroparesis. - Zofran 4 mg PO BID PRN nausea - Protonix 40 mg PO daily

## 2015-05-31 NOTE — Progress Notes (Signed)
Internal Medicine Clinic Attending  Case discussed with Dr. Taylor at the time of the visit.  We reviewed the resident's history and exam and pertinent patient test results.  I agree with the assessment, diagnosis, and plan of care documented in the resident's note. 

## 2015-05-31 NOTE — Progress Notes (Signed)
Patient ID: Ashley Long, female   DOB: [birth year], 55 y.o.   MRN: [id number]   Subjective:   Patient ID: Ashley Long female   DOB: [birth year] 55 y.o.   MRN: [id number]  HPI: Ashley Long is a 55 y.o. female with PMH as below, here for f/u UTI and continued N/V.  Please see Problem-Based charting for the status of the patient's chronic medical issues.  Patient was seen in the ED in February for N/V for one week without fever, chills, or abdominal pain.  U/A showed possible UTI and patient was given course of Keflex.  She has had continued nausea with vomiting.  She also has 1-2 watery BMs per day for the last week.  Nausea and diarrhea are nonbloody.  She denies fevers, chills, sick contacts, abdominal pain, congestion, recent hospitalization, travel, or recent antibiotics.  She has a h/o poorly controlled DMII, with last A1c >9, but she denies abdominal pain.  She has a h/o acid reflux, for which she takes PRN Mylanta.  She has also been having dysphagia, described as food getting stuck in her throat.  She will sometimes cough it back up, including soup.   Past Medical History  Diagnosis Date  . DIABETES MELLITUS, TYPE II 06/02/2006    Last HBA1C 8.7.   . HYPERLIPIDEMIA 06/02/2006    Qualifier: Diagnosis of  By: Patrice Paradise MD, Roderic Palau    . HYPERTENSION 06/02/2006    Filed Vitals:   01/21/11 1134  BP: 162/98  Pulse: 80  Temp: 97.4 F (36.3 C)  TempSrc: Oral  Resp: 20  Height: '5\' 8"'  (1.727 m)  Weight: 261 lb (118.389 kg)      Current Outpatient Prescriptions  Medication Sig Dispense Refill  . amLODipine (NORVASC) 10 MG tablet Take 1 tablet (10 mg total) by mouth at bedtime. 90 tablet 3  . aspirin 81 MG EC tablet Take 1 tablet (81 mg total) by mouth daily. 90 tablet 3  . Blood Glucose Monitoring Suppl (WAVESENSE PRESTO) W/DEVICE KIT 1 each by Does not apply route 2 (two) times daily. 1 each 0  . carvedilol (COREG) 25 MG tablet Take 1 tablet (25 mg total) by mouth 2 (two) times daily.  180 tablet 3  . cephALEXin (KEFLEX) 500 MG capsule Take 1 capsule (500 mg total) by mouth 4 (four) times daily. 20 capsule 0  . cloNIDine (CATAPRES) 0.3 MG tablet Take 1 tablet (0.3 mg total) by mouth 2 (two) times daily. 180 tablet 3  . glucose blood (WAVESENSE PRESTO) test strip Use to test blood sugar 4 times daily. 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 hydrochlorothiazide (HYDRODIURIL) 12.5 MG tablet Take 1 tablet (12.5 mg total) by mouth daily. 30 tablet 1  . insulin NPH-regular Human (NOVOLIN 70/30) (70-30) 100 UNIT/ML injection 38 units in the AM and 18 units in the PM 15 minutes prior to food 15 mL 11  . Insulin Syringe-Needle U-100 31G X 15/64" 0.3 ML MISC 1 each by Does not apply route 2 (two) times daily with a meal. 100 each 11  . Lancets 30G MISC 1 each by Does not apply route 2 (two) times daily. 100 each 12  . metFORMIN (GLUCOPHAGE) 1000 MG tablet Take 1 tablet (1,000 mg total) by mouth 2 (two) times daily. 180 tablet 3  . Multiple Vitamins-Minerals (CENTRUM) tablet Take 1 tablet by mouth daily.      . ondansetron (ZOFRAN ODT) 8 MG disintegrating tablet Take 1 tablet (8 mg total)  by mouth every 8 (eight) hours as needed for nausea or vomiting. 10 tablet 0  . ondansetron (ZOFRAN) 4 MG tablet Take 1 tablet (4 mg total) by mouth 2 (two) times daily as needed for nausea or vomiting. 30 tablet 0  . pantoprazole (PROTONIX) 40 MG tablet Take 1 tablet (40 mg total) by mouth daily. 30 tablet 1  . simvastatin (ZOCOR) 40 MG tablet Take 1 tablet (40 mg total) by mouth daily. 30 tablet 11  . Varicella-Zoster Immune Glob 125 UNIT/1.2ML SOLN Inject 1 application into the muscle once. 1 vial 0   No current facility-administered medications for this visit.   Family History  Problem Relation Age of Onset  . Other Neg Hx    Social History   Social History  . Marital Status: Single    Spouse Name: N/A  . Number of Children: N/A  . Years of Education: 12   Occupational History  .  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 Smoking status: Former Smoker    Quit date: 10/15/1982  . Smokeless tobacco: None  . Alcohol Use: No  . Drug Use: No  . Sexual Activity: Not Asked   Other Topics Concern  . None   Social History Narrative   Financial assistance approved for 100% discount at [facility] and has [facility] card per Bonna Gains   02/12/10         Review of Systems: Pertinent items are noted in HPI. Objective:  Physical Exam: Filed Vitals:   05/31/15 1125  BP: 130/82  Pulse: 74  Temp: 98.3 F (36.8 C)  TempSrc: Oral  Height: '5\' 8"'  (1.727 m)  Weight: 252 lb (114.306 kg)  SpO2: 99%   Physical Exam  Constitutional: She is oriented to person, place, and time and well-developed, well-nourished, and in no distress. No distress.  HENT:  Head: Normocephalic and atraumatic.  Eyes: EOM are normal. No scleral icterus.  Neck: No tracheal deviation present.  Cardiovascular: Normal rate, regular rhythm and normal heart sounds.   Pulmonary/Chest: Effort normal and breath sounds normal. No stridor. No respiratory distress. She has no wheezes.  Abdominal: Soft. She exhibits no distension. There is no tenderness. There is no rebound and no guarding.  Neurological: She is alert and oriented to person, place, and time.  Skin: Skin is warm and dry. She is not diaphoretic.     Assessment & Plan:   Patient and case were discussed with Dr. Dareen Piano.  Please refer to Problem Based charting for further documentation.

## 2015-05-31 NOTE — Patient Instructions (Signed)
1. Stop taking Lisinopril.  We have sent a new prescription for HCTZ alone to your pharmacy. 2. Start taking Protonix 40 mg (1 tab) daily for acid reflux. 3. Return to clinic in 2 weeks for recheck.  Gastroesophageal Reflux Disease, Adult Normally, food travels down the esophagus and stays in the stomach to be digested. However, when a person has gastroesophageal reflux disease (GERD), food and stomach acid move back up into the esophagus. When this happens, the esophagus becomes sore and inflamed. Over time, GERD can create small holes (ulcers) in the lining of the esophagus.  CAUSES This condition is caused by a problem with the muscle between the esophagus and the stomach (lower esophageal sphincter, or LES). Normally, the LES muscle closes after food passes through the esophagus to the stomach. When the LES is weakened or abnormal, it does not close properly, and that allows food and stomach acid to go back up into the esophagus. The LES can be weakened by certain dietary substances, medicines, and medical conditions, including:  Tobacco use.  Pregnancy.  Having a hiatal hernia.  Heavy alcohol use.  Certain foods and beverages, such as coffee, chocolate, onions, and peppermint. RISK FACTORS This condition is more likely to develop in:  People who have an increased body weight.  People who have connective tissue disorders.  People who use NSAID medicines. SYMPTOMS Symptoms of this condition include:  Heartburn.  Difficult or painful swallowing.  The feeling of having a lump in the throat.  Abitter taste in the mouth.  Bad breath.  Having a large amount of saliva.  Having an upset or bloated stomach.  Belching.  Chest pain.  Shortness of breath or wheezing.  Ongoing (chronic) cough or a night-time cough.  Wearing away of tooth enamel.  Weight loss. Different conditions can cause chest pain. Make sure to see your health care provider if you experience chest  pain. DIAGNOSIS Your health care provider will take a medical history and perform a physical exam. To determine if you have mild or severe GERD, your health care provider may also monitor how you respond to treatment. You may also have other tests, including:  An endoscopy toexamine your stomach and esophagus with a small camera.  A test thatmeasures the acidity level in your esophagus.  A test thatmeasures how much pressure is on your esophagus.  A barium swallow or modified barium swallow to show the shape, size, and functioning of your esophagus. TREATMENT The goal of treatment is to help relieve your symptoms and to prevent complications. Treatment for this condition may vary depending on how severe your symptoms are. Your health care provider may recommend:  Changes to your diet.  Medicine.  Surgery. HOME CARE INSTRUCTIONS Diet  Follow a diet as recommended by your health care provider. This may involve avoiding foods and drinks such as:  Coffee and tea (with or without caffeine).  Drinks that containalcohol.  Energy drinks and sports drinks.  Carbonated drinks or sodas.  Chocolate and cocoa.  Peppermint and mint flavorings.  Garlic and onions.  Horseradish.  Spicy and acidic foods, including peppers, chili powder, curry powder, vinegar, hot sauces, and barbecue sauce.  Citrus fruit juices and citrus fruits, such as oranges, lemons, and limes.  Tomato-based foods, such as red sauce, chili, salsa, and pizza with red sauce.  Fried and fatty foods, such as donuts, french fries, potato chips, and high-fat dressings.  High-fat meats, such as hot dogs and fatty cuts of red and white meats,  such as rib eye steak, sausage, ham, and bacon.  High-fat dairy items, such as whole milk, butter, and cream cheese.  Eat small, frequent meals instead of large meals.  Avoid drinking large amounts of liquid with your meals.  Avoid eating meals during the 2-3 hours before  bedtime.  Avoid lying down right after you eat.  Do not exercise right after you eat. General Instructions  Pay attention to any changes in your symptoms.  Take over-the-counter and prescription medicines only as told by your health care provider. Do not take aspirin, ibuprofen, or other NSAIDs unless your health care provider told you to do so.  Do not use any tobacco products, including cigarettes, chewing tobacco, and e-cigarettes. If you need help quitting, ask your health care provider.  Wear loose-fitting clothing. Do not wear anything tight around your waist that causes pressure on your abdomen.  Raise (elevate) the head of your bed 6 inches (15cm).  Try to reduce your stress, such as with yoga or meditation. If you need help reducing stress, ask your health care provider.  If you are overweight, reduce your weight to an amount that is healthy for you. Ask your health care provider for guidance about a safe weight loss goal.  Keep all follow-up visits as told by your health care provider. This is important. SEEK MEDICAL CARE IF:  You have new symptoms.  You have unexplained weight loss.  You have difficulty swallowing, or it hurts to swallow.  You have wheezing or a persistent cough.  Your symptoms do not improve with treatment.  You have a hoarse voice. SEEK IMMEDIATE MEDICAL CARE IF:  You have pain in your arms, neck, jaw, teeth, or back.  You feel sweaty, dizzy, or light-headed.  You have chest pain or shortness of breath.  You vomit and your vomit looks like blood or coffee grounds.  You faint.  Your stool is bloody or black.  You cannot swallow, drink, or eat.   This information is not intended to replace advice given to you by your health care provider. Make sure you discuss any questions you have with your health care provider.   Document Released: 12/11/2004 Document Revised: 11/22/2014 Document Reviewed: 06/28/2014 Elsevier Interactive Patient  Education [employer].

## 2015-06-01 ENCOUNTER — Telehealth: Payer: Self-pay | Admitting: *Deleted

## 2015-06-01 NOTE — Telephone Encounter (Signed)
Rx called into [facility] per pt's request.Ashley Wiechmann Cassady3/17/20174:37 PM

## 2015-06-12 ENCOUNTER — Ambulatory Visit
Admission: RE | Admit: 2015-06-12 | Discharge: 2015-06-12 | Disposition: A | Discharge status: Home / Self Care | Payer: No Typology Code available for payment source | Source: Ambulatory Visit

## 2015-06-12 ENCOUNTER — Telehealth: Payer: Self-pay | Admitting: Internal Medicine

## 2015-06-12 DIAGNOSIS — Z1231 Encounter for screening mammogram for malignant neoplasm of breast: Secondary | ICD-10-CM

## 2015-06-12 NOTE — Telephone Encounter (Signed)
APPT. REMINDER CALL, LMTCB °

## 2015-06-14 ENCOUNTER — Encounter: Payer: Self-pay | Admitting: Internal Medicine

## 2015-06-14 ENCOUNTER — Ambulatory Visit (INDEPENDENT_AMBULATORY_CARE_PROVIDER_SITE_OTHER): Payer: No Typology Code available for payment source | Admitting: Internal Medicine

## 2015-06-14 VITALS — BP 140/80 | HR 70 | Temp 98.0°F | Ht 68.0 in | Wt 258.0 lb

## 2015-06-14 DIAGNOSIS — Z79899 Other long term (current) drug therapy: Secondary | ICD-10-CM

## 2015-06-14 DIAGNOSIS — Z8719 Personal history of other diseases of the digestive system: Secondary | ICD-10-CM

## 2015-06-14 DIAGNOSIS — Z09 Encounter for follow-up examination after completed treatment for conditions other than malignant neoplasm: Secondary | ICD-10-CM

## 2015-06-14 DIAGNOSIS — R112 Nausea with vomiting, unspecified: Secondary | ICD-10-CM

## 2015-06-14 DIAGNOSIS — K5900 Constipation, unspecified: Secondary | ICD-10-CM

## 2015-06-14 DIAGNOSIS — I10 Essential (primary) hypertension: Secondary | ICD-10-CM

## 2015-06-14 NOTE — Assessment & Plan Note (Signed)
Patient previously on Lisinopril, stopped 2/2 cough.  BP borderline controlled today on Amlodipine and HCTZ 12.5mg , with near complete resolution of cough.    A/P: HTN.  Will continue patient on current regimen until next follow up with PCP.  Advise trial of Losartan or other ARB to be initiated at follow up. - Amlodipine and HCTZ. Consider ARB at f/u

## 2015-06-14 NOTE — Assessment & Plan Note (Signed)
Patient reports 2-3 days of constipation with hard, pellet like stools.  She reports drinking lots of water and some apple juice.  She has a good diet that includes fruits and vegetables.  She gets exercise.  She has used Metamucil in the past.  A/P: Constipation.  Patient appears to have done normal first line therapies, so will supplement fiber intake with patient's home Metamucil.  She can also use Colace if needed. - Metamucil - Colace

## 2015-06-14 NOTE — Assessment & Plan Note (Signed)
Patient's nausea and vomiting have resolved. She denies complaint today.  A/P: Viral gastro, resolved.

## 2015-06-14 NOTE — Patient Instructions (Signed)
1. Slowly increase fiber intake with Metamucil.  If you become too gassy/bloated, you can try Benefiber instead. 2. If you need an additional stool softener, you may use Colace 1-3 capsules daily. Avoid laxatives. 3. Continue to drink plenty of fluids and eat high fiber foods.  High-Fiber Diet Fiber, also called dietary fiber, is a type of carbohydrate found in fruits, vegetables, whole grains, and beans. A high-fiber diet can have many health benefits. Your health care provider may recommend a high-fiber diet to help:  Prevent constipation. Fiber can make your bowel movements more regular.  Lower your cholesterol.  Relieve hemorrhoids, uncomplicated diverticulosis, or irritable bowel syndrome.  Prevent overeating as part of a weight-loss plan.  Prevent heart disease, type 2 diabetes, and certain cancers. WHAT IS MY PLAN? The recommended daily intake of fiber includes:  38 grams for men under age 63.  80 grams for men over age 42.  30 grams for women under age 47.  82 grams for women over age 4. You can get the recommended daily intake of dietary fiber by eating a variety of fruits, vegetables, grains, and beans. Your health care provider may also recommend a fiber supplement if it is not possible to get enough fiber through your diet. WHAT DO I NEED TO KNOW ABOUT A HIGH-FIBER DIET?  Fiber supplements have not been widely studied for their effectiveness, so it is better to get fiber through food sources.  Always check the fiber content on thenutrition facts label of any prepackaged food. Look for foods that contain at least 5 grams of fiber per serving.  Ask your dietitian if you have questions about specific foods that are related to your condition, especially if those foods are not listed in the following section.  Increase your daily fiber consumption gradually. Increasing your intake of dietary fiber too quickly may cause bloating, cramping, or gas.  Drink plenty of water.  Water helps you to digest fiber. WHAT FOODS CAN I EAT? Grains Whole-grain breads. Multigrain cereal. Oats and oatmeal. Brown rice. Barley. Bulgur wheat. [place]. Bran muffins. Popcorn. Rye wafer crackers. Vegetables Sweet potatoes. Spinach. Kale. Artichokes. Cabbage. Broccoli. Green peas. Carrots. Squash. Fruits Berries. Pears. Apples. Oranges. Avocados. Prunes and raisins. Dried figs. Meats and Other Protein Sources [place], kidney, pinto, and soy beans. Split peas. Lentils. Nuts and seeds. Dairy Fiber-fortified yogurt. Beverages Fiber-fortified soy milk. Fiber-fortified orange juice. Other Fiber bars. The items listed above may not be a complete list of recommended foods or beverages. Contact your dietitian for more options. WHAT FOODS ARE NOT RECOMMENDED? Grains White bread. Pasta made with refined flour. White rice. Vegetables Fried potatoes. Canned vegetables. Well-cooked vegetables.  Fruits Fruit juice. Cooked, strained fruit. Meats and Other Protein Sources Fatty cuts of meat. Fried Sales executive or fried fish. Dairy Milk. Yogurt. Cream cheese. Sour cream. Beverages Soft drinks. Other Cakes and pastries. Butter and oils. The items listed above may not be a complete list of foods and beverages to avoid. Contact your dietitian for more information. WHAT ARE SOME TIPS FOR INCLUDING HIGH-FIBER FOODS IN MY DIET?  Eat a wide variety of high-fiber foods.  Make sure that half of all grains consumed each day are whole grains.  Replace breads and cereals made from refined flour or white flour with whole-grain breads and cereals.  Replace white rice with brown rice, bulgur wheat, or millet.  Start the day with a breakfast that is high in fiber, such as a cereal that contains at least 5 grams of fiber  per serving.  Use beans in place of meat in soups, salads, or pasta.  Eat high-fiber snacks, such as berries, raw vegetables, nuts, or popcorn.   This information is not intended to  replace advice given to you by your health care provider. Make sure you discuss any questions you have with your health care provider.   Document Released: 03/03/2005 Document Revised: 03/24/2014 Document Reviewed: 08/16/2013 Elsevier Interactive Patient Education [employer].

## 2015-06-14 NOTE — Progress Notes (Signed)
Patient ID: [name], female   DOB: 1960-06-05, 55 y.o.   MRN: [id number]   Subjective:   Patient ID: Ashley Long female   DOB: [birth year] 55 y.o.   MRN: [id number]  HPI: Ashley Long is a 55 y.o. female with PMH as below, here for f/u nausea.  Please see Problem-Based charting for the status of the patient's chronic medical issues.     Past Medical History  Diagnosis Date  . DIABETES MELLITUS, TYPE II 06/02/2006    Last HBA1C 8.7.   . HYPERLIPIDEMIA 06/02/2006    Qualifier: Diagnosis of  By: Patrice Paradise MD, Roderic Palau    . HYPERTENSION 06/02/2006    Filed Vitals:   01/21/11 1134  BP: 162/98  Pulse: 80  Temp: 97.4 F (36.3 C)  TempSrc: Oral  Resp: 20  Height: _0  (1.727 m)  Weight: 261 lb (118.389 kg)      Current Outpatient Prescriptions  Medication Sig Dispense Refill  . amLODipine (NORVASC) 10 MG tablet Take 1 tablet (10 mg total) by mouth at bedtime. 90 tablet 3  . aspirin 81 MG EC tablet Take 1 tablet (81 mg total) by mouth daily. 90 tablet 3  . Blood Glucose Monitoring Suppl (WAVESENSE PRESTO) W/DEVICE KIT 1 each by Does not apply route 2 (two) times daily. 1 each 0  . carvedilol (COREG) 25 MG tablet Take 1 tablet (25 mg total) by mouth 2 (two) times daily. 180 tablet 3  . cephALEXin (KEFLEX) 500 MG capsule Take 1 capsule (500 mg total) by mouth 4 (four) times daily. 20 capsule 0  . cloNIDine (CATAPRES) 0.3 MG tablet Take 1 tablet (0.3 mg total) by mouth 2 (two) times daily. 180 tablet 3  . glucose blood (WAVESENSE PRESTO) test strip Use to test blood sugar 4 times daily. 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 hydrochlorothiazide (HYDRODIURIL) 12.5 MG tablet Take 1 tablet (12.5 mg total) by mouth daily. 30 tablet 1  . insulin NPH-regular Human (NOVOLIN 70/30) (70-30) 100 UNIT/ML injection 38 units in the AM and 18 units in the PM 15 minutes prior to food 15 mL 11  . Insulin Syringe-Needle U-100 31G X 15/64" 0.3 ML MISC 1 each by Does not apply route 2 (two)  times daily with a meal. 100 each 11  . Lancets 30G MISC 1 each by Does not apply route 2 (two) times daily. 100 each 12  . metFORMIN (GLUCOPHAGE) 1000 MG tablet Take 1 tablet (1,000 mg total) by mouth 2 (two) times daily. 180 tablet 3  . Multiple Vitamins-Minerals (CENTRUM) tablet Take 1 tablet by mouth daily.      . ondansetron (ZOFRAN ODT) 8 MG disintegrating tablet Take 1 tablet (8 mg total) by mouth every 8 (eight) hours as needed for nausea or vomiting. 10 tablet 0  . ondansetron (ZOFRAN) 4 MG tablet Take 1 tablet (4 mg total) by mouth 2 (two) times daily as needed for nausea or vomiting. 30 tablet 0  . pantoprazole (PROTONIX) 40 MG tablet Take 1 tablet (40 mg total) by mouth daily. 30 tablet 1  . simvastatin (ZOCOR) 40 MG tablet Take 1 tablet (40 mg total) by mouth daily. 30 tablet 11  . Varicella-Zoster Immune Glob 125 UNIT/1.2ML SOLN Inject 1 application into the muscle once. 1 vial 0   No current facility-administered medications for this visit.   Family History  Problem Relation Age of Onset  . Other Neg Hx    Social History   Social  History  . Marital Status: Single    Spouse Name: N/A  . Number of Children: N/A  . 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 Smoking status: Former Smoker    Quit date: 10/15/1982  . Smokeless tobacco: Not on file  . Alcohol Use: No  . Drug Use: No  . Sexual Activity: Not on file   Other Topics Concern  . Not on file   Social History Narrative   Financial assistance approved for 100% discount at [facility] and has [facility] card per Bonna Gains   02/12/10         Review of Systems: She denies N/V, SOB, diarrhea, or blood in stool.  Cough has almost completely resolved.  She is mildly constipated. Objective:  Physical Exam: Filed Vitals:   06/14/15 1314  BP: 140/80  Pulse: 70  Temp: 98 F (36.7 C)  TempSrc: Oral  Height: _0  (1.727 m)  Weight: 258 lb (117.028 kg)    SpO2: 99%   Physical Exam  Constitutional: She is oriented to person, place, and time and well-developed, well-nourished, and in no distress. No distress.  HENT:  Head: Normocephalic and atraumatic.  Eyes: EOM are normal. No scleral icterus.  Neck: No tracheal deviation present.  Cardiovascular: Normal rate, regular rhythm and normal heart sounds.   Pulmonary/Chest: Effort normal and breath sounds normal. No stridor. No respiratory distress. She has no wheezes.  Neurological: She is alert and oriented to person, place, and time.  Skin: Skin is warm and dry. She is not diaphoretic.     Assessment & Plan:   Patient and case were discussed with Dr. Lynnae January.  Please refer to Problem Based charting for further documentation.

## 2015-06-15 NOTE — Addendum Note (Signed)
Addended by: Larey Dresser A on: 06/15/2015 08:10 AM   Modules accepted: Level of Service

## 2015-06-15 NOTE — Progress Notes (Signed)
Internal Medicine Clinic Attending  Case discussed with Dr. Taylor at the time of the visit.  We reviewed the resident's history and exam and pertinent patient test results.  I agree with the assessment, diagnosis, and plan of care documented in the resident's note. 

## 2015-07-18 ENCOUNTER — Other Ambulatory Visit: Payer: Self-pay | Admitting: Dietician

## 2015-07-18 DIAGNOSIS — E119 Type 2 diabetes mellitus without complications: Secondary | ICD-10-CM

## 2015-07-18 MED ORDER — GLUCOSE BLOOD VI STRP: ORAL_STRIP | Status: DC

## 2015-07-18 NOTE — Telephone Encounter (Signed)
Patient calls to request refill on test strips. Called health department and gave refill on test strips for 3 times a day testing per patient request.

## 2015-07-25 ENCOUNTER — Ambulatory Visit (INDEPENDENT_AMBULATORY_CARE_PROVIDER_SITE_OTHER): Payer: No Typology Code available for payment source | Admitting: Internal Medicine

## 2015-07-25 ENCOUNTER — Encounter: Payer: Self-pay | Admitting: Internal Medicine

## 2015-07-25 VITALS — BP 168/102 | HR 74 | Temp 98.2°F | Ht 68.0 in | Wt 259.9 lb

## 2015-07-25 DIAGNOSIS — K5900 Constipation, unspecified: Secondary | ICD-10-CM

## 2015-07-25 DIAGNOSIS — Z79899 Other long term (current) drug therapy: Secondary | ICD-10-CM

## 2015-07-25 DIAGNOSIS — E119 Type 2 diabetes mellitus without complications: Secondary | ICD-10-CM

## 2015-07-25 DIAGNOSIS — Z299 Encounter for prophylactic measures, unspecified: Secondary | ICD-10-CM

## 2015-07-25 DIAGNOSIS — E11649 Type 2 diabetes mellitus with hypoglycemia without coma: Secondary | ICD-10-CM

## 2015-07-25 DIAGNOSIS — I10 Essential (primary) hypertension: Secondary | ICD-10-CM

## 2015-07-25 DIAGNOSIS — Z794 Long term (current) use of insulin: Secondary | ICD-10-CM

## 2015-07-25 LAB — GLUCOSE, CAPILLARY
GLUCOSE-CAPILLARY: 54 mg/dL — AB (ref 65–99)
GLUCOSE-CAPILLARY: 108 mg/dL — AB (ref 65–99)

## 2015-07-25 LAB — POCT GLYCOSYLATED HEMOGLOBIN (HGB A1C): Hemoglobin A1C: 8.6

## 2015-07-25 MED ORDER — POLYETHYLENE GLYCOL 3350 17 GM/SCOOP PO POWD: 17.0000 g | Freq: Every day | ORAL | Status: DC

## 2015-07-25 MED ORDER — LOSARTAN POTASSIUM 100 MG PO TABS: 100.0000 mg | ORAL_TABLET | Freq: Every day | ORAL | Status: DC

## 2015-07-25 MED ORDER — INSULIN NPH ISOPHANE & REGULAR (70-30) 100 UNIT/ML ~~LOC~~ SUSP: SUBCUTANEOUS | Status: DC

## 2015-07-25 MED ORDER — HYDROCHLOROTHIAZIDE 12.5 MG PO TABS: 12.5000 mg | ORAL_TABLET | Freq: Every day | ORAL | Status: DC

## 2015-07-25 NOTE — Assessment & Plan Note (Signed)
Lab Results  Component Value Date   HGBA1C 8.6 07/25/2015   HGBA1C 9.1 04/25/2015   HGBA1C 7.8 12/20/2014     Assessment: Diabetes control:  Uncontrolled Progress toward A1C goal:   Improving Comments: Patient was hypoglycemic in the clinic today at 76, but asymptomatic which resolved after eating. Upon review of her CBGs, she tends to have occasional hypoglycemia in the late afternoon but well controlled CBGs in the morning.   Plan: Medications:  Decrease AM 70/30 from 38 units to 36 units. Continue 70/30 18 units QHS. Continue Metformin 1000 mg BID. Home glucose monitoring: Frequency:  QID Timing:  ACHS Instruction/counseling given: reminded to bring blood glucose meter & log to each visit and discussed the need for weight loss Educational resources provided:   Self management tools provided: copy of home glucose meter download, home glucose logbook Other plans:Follow up one month

## 2015-07-25 NOTE — Progress Notes (Signed)
Hypoglycemic Event  CBG: 54  Treatment: OJ and graham crackers given.  Symptoms: asymptomatic.  Follow-up CBG: Time: 1350PM CBG Result: 108  Possible Reasons for Event: Had early lunch.  Comments/MD notified:  Yes    Ashley Long

## 2015-07-25 NOTE — Assessment & Plan Note (Signed)
BP Readings from Last 3 Encounters:  07/25/15 168/102  06/14/15 140/80  05/31/15 130/82    Lab Results  Component Value Date   NA 139 05/04/2015   K 3.7 05/04/2015   CREATININE 1.04* 05/04/2015    Assessment: Blood pressure control:  Uncontrolled. Progress toward BP goal:   Deteriorated Comments: Patient developed cough 2/2 lisinopril which has now resolved. Due to a change in medications, her BP has been uncontrolled which I confirmed with her home BP monitor (patient is usually higher in the clinic as compared to home bp readings, but her BP monitor is accurate as we have calibrated it in the clinic.)  Plan: Medications:  Start Losartan 100 mg daily, continue HCTZ 12.5 mg daily, Coreg 25 mg BID, Clonidine 0.3 mg BID, Amlodipine 10 mg daily. Educational resources provided:   Self management tools provided:   Other plans: Follow up in one month

## 2015-07-25 NOTE — Patient Instructions (Signed)
TAKE MIRALAX 17 GRAMS ONCE A DAY. YOU CAN TAKE THIS IN ADDITION TO [place].  TAKE LOSARTAN 100 MG DAILY IN ADDITION TO HCTZ 12.5 MG DAILY AND YOUR OTHER BLOOD PRESSURE MEDICATIONS.   DECREASE INSULIN TO 36 UNITS IN THE MORNING AND 18 UNITS AT NIGHT.  FOLLOW UP IN 3 MONTHS UNLESS YOU HAVE AN ISSUES.

## 2015-07-25 NOTE — Assessment & Plan Note (Signed)
Patient admits to increased constipation, but denies blood in her stool. Last colonoscopy normal in 2015 with plans to repeat 2020. Patient has not had much relief with colace and benefiber.  Plan: -Start Miralax 17 grams daily -Continue Colace and Benefiber

## 2015-07-25 NOTE — Progress Notes (Signed)
Subjective:    Patient ID: Ashley Long, female    DOB: [birth year], 55 y.o.   MRN: [id number]  HPI Ashley Long is a 55 y.o. female with PMHx of HtN, T2DM, HLD who presents to the clinic for follow up for HTN and T2DM. Please see A&P for the status of the patient's chronic medical problems.   Past Medical History  Diagnosis Date  . DIABETES MELLITUS, TYPE II 06/02/2006    Last HBA1C 8.7.   . HYPERLIPIDEMIA 06/02/2006    Qualifier: Diagnosis of  By: Patrice Paradise MD, Roderic Palau    . HYPERTENSION 06/02/2006    Filed Vitals:   01/21/11 1134  BP: 162/98  Pulse: 80  Temp: 97.4 F (36.3 C)  TempSrc: Oral  Resp: 20  Height: _0  (1.727 m)  Weight: 261 lb (118.389 kg)       Outpatient Encounter Prescriptions as of 07/25/2015  Medication Sig  . amLODipine (NORVASC) 10 MG tablet Take 1 tablet (10 mg total) by mouth at bedtime.  Marland Kitchen aspirin 81 MG EC tablet Take 1 tablet (81 mg total) by mouth daily.  . Blood Glucose Monitoring Suppl (WAVESENSE PRESTO) W/DEVICE KIT 1 each by Does not apply route 2 (two) times daily.  . carvedilol (COREG) 25 MG tablet Take 1 tablet (25 mg total) by mouth 2 (two) times daily.  . cephALEXin (KEFLEX) 500 MG capsule Take 1 capsule (500 mg total) by mouth 4 (four) times daily.  . cloNIDine (CATAPRES) 0.3 MG tablet Take 1 tablet (0.3 mg total) by mouth 2 (two) times daily.  Marland Kitchen glucose blood (WAVESENSE PRESTO) test strip Use to test blood sugar 3 times daily. diag code E11.9. Insulin dependent  . hydrochlorothiazide (HYDRODIURIL) 12.5 MG tablet Take 1 tablet (12.5 mg total) by mouth daily.  . insulin NPH-regular Human (NOVOLIN 70/30) (70-30) 100 UNIT/ML injection 36 units in the AM and 18 units in the PM 15 minutes prior to food  . Insulin Syringe-Needle U-100 31G X 15/64" 0.3 ML MISC 1 each by Does not apply route 2 (two) times daily with a meal.  . Lancets 30G MISC 1 each by Does not apply route 2 (two) times daily.  Marland Kitchen losartan (COZAAR) 100 MG tablet Take 1 tablet (100 mg  total) by mouth daily.  . metFORMIN (GLUCOPHAGE) 1000 MG tablet Take 1 tablet (1,000 mg total) by mouth 2 (two) times daily.  . Multiple Vitamins-Minerals (CENTRUM) tablet Take 1 tablet by mouth daily.    . ondansetron (ZOFRAN ODT) 8 MG disintegrating tablet Take 1 tablet (8 mg total) by mouth every 8 (eight) hours as needed for nausea or vomiting.  . ondansetron (ZOFRAN) 4 MG tablet Take 1 tablet (4 mg total) by mouth 2 (two) times daily as needed for nausea or vomiting.  . pantoprazole (PROTONIX) 40 MG tablet Take 1 tablet (40 mg total) by mouth daily.  . polyethylene glycol powder (MIRALAX) powder Take 17 g by mouth daily.  . simvastatin (ZOCOR) 40 MG tablet Take 1 tablet (40 mg total) by mouth daily.  Durenda Hurt Immune Glob 125 UNIT/1.2ML SOLN Inject 1 application into the muscle once.  . [DISCONTINUED] insulin NPH-regular Human (NOVOLIN 70/30) (70-30) 100 UNIT/ML injection 38 units in the AM and 18 units in the PM 15 minutes prior to food   No facility-administered encounter medications on file as of 07/25/2015.    Family History  Problem Relation Age of Onset  . Other Neg Hx     Social History  Social History  . Marital Status: Single    Spouse Name: N/A  . Number of Children: N/A  . 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 Smoking status: Former Smoker    Quit date: 10/15/1982  . Smokeless tobacco: Not on file  . Alcohol Use: No  . Drug Use: No  . Sexual Activity: Not on file   Other Topics Concern  . Not on file   Social History Narrative   Financial assistance approved for 100% discount at [facility] and has [facility] card per Bonna Gains   02/12/10         Review of Systems General: Denies fatigue, change in appetite or diaphoresis.  Respiratory: Denies SOB, cough, DOE.   Cardiovascular: Denies chest pain and palpitations.  Gastrointestinal: Admits to constipation. Denies nausea, vomiting,  abdominal pain, diarrhea, blood in stool and abdominal distention.  Endocrine: Denies polyuria, and polydipsia. Neurological: Denies dizziness, headaches, weakness, lightheadedness     Objective:   Physical Exam Filed Vitals:   07/25/15 1429  BP: 168/102  Pulse: 74  Temp: 98.2 F (36.8 C)  TempSrc: Oral  Height: _0  (1.727 m)  Weight: 259 lb 14.4 oz (117.89 kg)  SpO2: 100%   General: Vital signs reviewed.  Patient is obese, in no acute distress and cooperative with exam.   Neck: Supple, trachea midline, no carotid bruit present.  Cardiovascular: RRR, S1 normal, S2 normal, no murmurs, gallops, or rubs. Pulmonary/Chest: Clear to auscultation bilaterally, no wheezes, rales, or rhonchi. Abdominal: Soft, non-tender, non-distended, BS + Extremities: No lower extremity edema bilaterally, pulses symmetric and intact bilaterally.  Skin: Warm, dry and intact.  Psychiatric: Normal mood and affect.      Assessment & Plan:   Please see problem based assessment and plan.

## 2015-07-25 NOTE — Assessment & Plan Note (Signed)
Mammogram on 06/12/15 normal. Repeat one year.

## 2015-07-26 LAB — HM DIABETES EYE EXAM

## 2015-07-26 NOTE — Progress Notes (Signed)
Case discussed with Dr. Burns soon after the resident saw the patient. We reviewed the resident's history and exam and pertinent patient test results. I agree with the assessment, diagnosis, and plan of care documented in the resident's note. 

## 2015-07-30 ENCOUNTER — Encounter: Payer: Self-pay | Admitting: *Deleted

## 2015-07-31 ENCOUNTER — Ambulatory Visit: Payer: No Typology Code available for payment source

## 2015-08-20 ENCOUNTER — Other Ambulatory Visit: Payer: Self-pay

## 2015-08-20 DIAGNOSIS — E119 Type 2 diabetes mellitus without complications: Secondary | ICD-10-CM

## 2015-08-20 DIAGNOSIS — I10 Essential (primary) hypertension: Secondary | ICD-10-CM

## 2015-08-20 MED ORDER — AMLODIPINE BESYLATE 10 MG PO TABS: 10.0000 mg | ORAL_TABLET | Freq: Every day | ORAL | Status: DC

## 2015-08-20 MED ORDER — CLONIDINE HCL 0.3 MG PO TABS: 0.3000 mg | ORAL_TABLET | Freq: Two times a day (BID) | ORAL | Status: DC

## 2015-08-20 MED ORDER — CARVEDILOL 25 MG PO TABS: 25.0000 mg | ORAL_TABLET | Freq: Two times a day (BID) | ORAL | Status: DC

## 2015-08-20 NOTE — Telephone Encounter (Signed)
Medication refill on ?BP

## 2015-08-21 ENCOUNTER — Other Ambulatory Visit: Payer: Self-pay

## 2015-08-21 NOTE — Telephone Encounter (Signed)
Called in metformin to [facility].

## 2015-08-21 NOTE — Telephone Encounter (Signed)
Pt requesting the nurse to call back regarding amlodipine.

## 2015-08-21 NOTE — Telephone Encounter (Signed)
Spoke w/ pt she states she is going thru [facility] for her amlodipine

## 2015-10-31 ENCOUNTER — Ambulatory Visit (INDEPENDENT_AMBULATORY_CARE_PROVIDER_SITE_OTHER): Payer: Self-pay | Admitting: Internal Medicine

## 2015-10-31 ENCOUNTER — Encounter: Payer: Self-pay | Admitting: Internal Medicine

## 2015-10-31 VITALS — BP 140/90 | HR 74 | Temp 98.2°F | Ht 68.0 in | Wt 267.1 lb

## 2015-10-31 DIAGNOSIS — Z794 Long term (current) use of insulin: Secondary | ICD-10-CM

## 2015-10-31 DIAGNOSIS — Z79899 Other long term (current) drug therapy: Secondary | ICD-10-CM

## 2015-10-31 DIAGNOSIS — E785 Hyperlipidemia, unspecified: Secondary | ICD-10-CM

## 2015-10-31 DIAGNOSIS — Z299 Encounter for prophylactic measures, unspecified: Secondary | ICD-10-CM

## 2015-10-31 DIAGNOSIS — I10 Essential (primary) hypertension: Secondary | ICD-10-CM

## 2015-10-31 DIAGNOSIS — E119 Type 2 diabetes mellitus without complications: Secondary | ICD-10-CM

## 2015-10-31 DIAGNOSIS — E11649 Type 2 diabetes mellitus with hypoglycemia without coma: Secondary | ICD-10-CM

## 2015-10-31 LAB — POCT GLYCOSYLATED HEMOGLOBIN (HGB A1C): HEMOGLOBIN A1C: 8.3

## 2015-10-31 LAB — GLUCOSE, CAPILLARY: GLUCOSE-CAPILLARY: 183 mg/dL — AB (ref 65–99)

## 2015-10-31 MED ORDER — GLIMEPIRIDE 2 MG PO TABS: 2.0000 mg | ORAL_TABLET | Freq: Every day | ORAL | 3 refills | Status: DC

## 2015-10-31 NOTE — Patient Instructions (Signed)
CONTINUE TAKING ALL MEDICATIONS THE SAME.  START TAKING GLIMEPIRIDE 2 MG ONCE A DAY IN THE MORNING. PLEASE LET ME KNOW IF YOU ARE HAVING MORE FREQUENT LOW BLOOD SUGARS, AND WE WILL DECREASE YOUR INSULIN.  FOLLOW UP IN 3 MONTHS.

## 2015-10-31 NOTE — Progress Notes (Signed)
    CC: T2DM follow up  HPI: [name] is a 55 y.o. female with PMHx of HTN and T2DM who presents to the clinic for follow up for T2DM. Please see problem based assessment and plan for more information.  Past Medical History:  Diagnosis Date  . DIABETES MELLITUS, TYPE II 06/02/2006   Last HBA1C 8.7.   . HYPERLIPIDEMIA 06/02/2006   Qualifier: Diagnosis of  By: Patrice Paradise MD, Roderic Palau    . HYPERTENSION 06/02/2006   Filed Vitals:   01/21/11 1134  BP: 162/98  Pulse: 80  Temp: 97.4 F (36.3 C)  TempSrc: Oral  Resp: 20  Height: 5\' 8"  (1.727 m)  Weight: 261 lb (118.389 kg)      Review of Systems: A complete ROS was negative except as noted in HPI.   Physical Exam: Vitals:   10/31/15 1359  BP: 140/90  Pulse: 74  Temp: 98.2 F (36.8 C)  TempSrc: Oral  SpO2: 100%  Weight: 267 lb 1.6 oz (121.2 kg)  Height: 5\' 8"  (1.727 m)   General: Vital signs reviewed.  Patient is well-developed and well-nourished, in no acute distress and cooperative with exam.  Cardiovascular: RRR, S1 normal, S2 normal, no murmurs, gallops, or rubs. Pulmonary/Chest: Clear to auscultation bilaterally, no wheezes, rales, or rhonchi. Abdominal: Soft, non-tender, non-distended, BS +, obese Extremities: No lower extremity edema bilaterally Skin: Warm, dry and intact. No rashes or erythema. Psychiatric: Normal mood and affect. speech and behavior is normal. Cognition and memory are normal.   Assessment & Plan:  See encounters tab for problem based medical decision making. Patient discussed with Dr. Evette Doffing

## 2015-10-31 NOTE — Assessment & Plan Note (Signed)
Lab Results  Component Value Date   HGBA1C 8.3 10/31/2015   HGBA1C 8.6 07/25/2015   HGBA1C 9.1 04/25/2015     Assessment: Diabetes control:  Uncontrolled Progress toward A1C goal:   Improved Comments: Afternoon hypoglycemic events have improved on regimen of 70/30 insulin 36 units QAM and 18 units QPM and Metformin 1000 mg BID. Fasting CBGs range 80-150. Afternoon CBGs range from 60s to 250s. Only 2 readings less than 70.   Plan: Medications:  continue current medications, add glimepiride 2 mg QAM. Patient educated to call clinic if she is noticing more frequent hypoglycemic events, in that case we will decrease insulin regimen. Instruction/counseling given: reminded to bring medications to each visit and discussed the need for weight loss Other plans: Follow up in 3 months

## 2015-10-31 NOTE — Assessment & Plan Note (Signed)
BP Readings from Last 3 Encounters:  10/31/15 140/90  07/25/15 (!) 168/102  06/14/15 140/80    Lab Results  Component Value Date   NA 139 05/04/2015   K 3.7 05/04/2015   CREATININE 1.04 (H) 05/04/2015    Assessment: Blood pressure control:  Controlled Progress toward BP goal:   At goal Comments: Home readings run from 100s/60s to 140s/90s. Compliant with losartan 100 mg daily, HCTZ 12.5 mg daily, Coreg 25 mg, Clonidine 0.3 mg BID, and amlodipine 10 mg daily.   Plan: Medications:  continue current medications Other plans: Follow up in 3 months

## 2015-10-31 NOTE — Assessment & Plan Note (Signed)
Compliant with simvastatin 40 mg daily.

## 2015-11-01 ENCOUNTER — Telehealth: Payer: Self-pay | Admitting: Student-PharmD

## 2015-11-01 NOTE — Telephone Encounter (Signed)
Contacted patient's pharmacies for refill histories Contacted [facility] first - only had amlodipine and simvastatin on file Contacted [facility] second - only coreg on file Contacted [facility] third - everything but centrum and pantoprazole on file (no OTC fills by [facility] except baby ASA)  Amlodipine 10 mg, 5/2 q 30  6/5 q 90 Aspirin 81 mg, 11/2013 Carvedilol 25 mg, 3/1 q 90  8/14 q 90 Clonidine 0.3 mg, 7/30 q 180 Glimepiride 2 mg - unknown Hydrochlorothiazide 12.5 mg 6/12, q90 Novolin 70/30 100 unit/mL injection - 7/30 Losartan 100 mg, 7/30 q 90  6/5, q 90 Metformin 1000 mg 7/7, q 90 MVI Centrum Tab - unknown Pantoprazole 40 mg - unknown Simvastatin 40 mg, 7/30 q 30  Lareina Espino PharmD Candidate, c/o 2019 11/01/2015 9:39 AM

## 2015-11-02 NOTE — Progress Notes (Signed)
Internal Medicine Clinic Attending  Case discussed with Dr. Burns at the time of the visit.  We reviewed the resident's history and exam and pertinent patient test results.  I agree with the assessment, diagnosis, and plan of care documented in the resident's note.  

## 2015-11-05 ENCOUNTER — Other Ambulatory Visit: Payer: Self-pay | Admitting: Internal Medicine

## 2016-01-17 ENCOUNTER — Telehealth: Payer: Self-pay | Admitting: Internal Medicine

## 2016-01-17 NOTE — Telephone Encounter (Signed)
LMTCB, TIME TO FILE FOR [employer] COVERAGE AND RENEW [facility] CARD

## 2016-01-23 ENCOUNTER — Ambulatory Visit: Payer: Self-pay

## 2016-02-05 NOTE — Progress Notes (Signed)
    CC: Follow up for HTN  HPI: [name] is a 55 y.o. female with PMHx of HTN, constipation, T2DM, and HLD who presents to the clinic for follow up for HTN. Please see problem based assessment and plan for more information.  Patient is eating and drinking well. She denies chest pain or shortness of breath.   Past Medical History:  Diagnosis Date  . DIABETES MELLITUS, TYPE II 06/02/2006   Last HBA1C 8.7.   . HYPERLIPIDEMIA 06/02/2006   Qualifier: Diagnosis of  By: Patrice Paradise MD, Roderic Palau    . HYPERTENSION 06/02/2006   Filed Vitals:   01/21/11 1134  BP: 162/98  Pulse: 80  Temp: 97.4 F (36.3 C)  TempSrc: Oral  Resp: 20  Height: 5\' 8"  (1.727 m)  Weight: 261 lb (118.389 kg)       Review of Systems: Please see pertinent ROS reviewed in HPI and problem based charting.   Physical Exam: Vitals:   02/06/16 1038  BP: (!) 150/70  Pulse: 74  Temp: 97.8 F (36.6 C)  TempSrc: Oral  SpO2: 100%  Weight: 272 lb 14.4 oz (123.8 kg)  Height: 5\' 8"  (1.727 m)   General: Vital signs reviewed.  Patient is obese, in no acute distress and cooperative with exam.  Cardiovascular: RRR, S1 normal, S2 normal, no murmurs, gallops, or rubs. Pulmonary/Chest: Clear to auscultation bilaterally, no wheezes, rales, or rhonchi. Abdominal: Soft, non-tender, non-distended, BS + Extremities: No lower extremity edema bilaterally, pedal pulses 2+ bilaterally Skin: Warm, dry and intact. No rashes or erythema. Psychiatric: Normal mood and affect. speech and behavior is normal. Cognition and memory are normal.   Assessment & Plan:  See encounters tab for problem based medical decision making. Patient discussed with Dr. Eppie Gibson

## 2016-02-05 NOTE — Assessment & Plan Note (Addendum)
Lab Results  Component Value Date   HGBA1C 8.6 02/06/2016   HGBA1C 8.3 10/31/2015   HGBA1C 8.6 07/25/2015     Assessment: Diabetes control:  Above goal Progress toward A1C goal:    Slightly deteriorated Comments: Last visit, patient was started on glimepiride 2 mg QAM in addition to her 70/30 insulin 36 units in the morning and 18 units at night and Metformin 1000 mg BID but states the [facility] does not carry that medications. She has somewhat unpredictable hypoglycemic events that occur overnight, in the morning or before dinnertime. These could be secondary to the somogyi effect; however, she is not frequency overly hyperglycemic in the morning. I feel her fluctuations are due to inconsistent dietary intake.   Plan: Medications: Continue current regimen. We discussed a consistent diet. Patient should also check her CBGs before bedtime as well.  Instruction/counseling given: reminded to bring blood glucose meter & log to each visit and reminded to bring medications to each visit Other plans: Follow up in 3 months

## 2016-02-05 NOTE — Assessment & Plan Note (Addendum)
BP Readings from Last 3 Encounters:  02/06/16 (!) 150/70  10/31/15 140/90  07/25/15 (!) 168/102    Lab Results  Component Value Date   NA 139 05/04/2015   K 3.7 05/04/2015   CREATININE 1.04 (H) 05/04/2015    Assessment: Blood pressure control:  Above goal, but controlled at home when comparing her blood pressure meter Progress toward BP goal:   Stable Comments: Patient reports compliance with HCTZ 12.5 mg QD, losartan 100 mg QD, Coreg 25 mg BID, clonidine 0.3 mg BID and amlodipine 10 mg QD. Her blood pressure at home has been running 120-130s/80-90s.  Plan: Medications:  continue current medications Other plans: Follow up in 3 months

## 2016-02-05 NOTE — Assessment & Plan Note (Addendum)
Breast Cancer Screening: mammogram due 05/2016 Colon Cancer Screening: Due 03/2023 Influenza Vaccine: Received Pneumococcal: Will discuss at following visit

## 2016-02-06 ENCOUNTER — Ambulatory Visit (INDEPENDENT_AMBULATORY_CARE_PROVIDER_SITE_OTHER): Payer: Self-pay | Admitting: Internal Medicine

## 2016-02-06 ENCOUNTER — Encounter: Payer: Self-pay | Admitting: Internal Medicine

## 2016-02-06 VITALS — BP 150/70 | HR 74 | Temp 97.8°F | Ht 68.0 in | Wt 272.9 lb

## 2016-02-06 DIAGNOSIS — Z299 Encounter for prophylactic measures, unspecified: Secondary | ICD-10-CM

## 2016-02-06 DIAGNOSIS — Z87891 Personal history of nicotine dependence: Secondary | ICD-10-CM

## 2016-02-06 DIAGNOSIS — Z79899 Other long term (current) drug therapy: Secondary | ICD-10-CM

## 2016-02-06 DIAGNOSIS — Z794 Long term (current) use of insulin: Secondary | ICD-10-CM

## 2016-02-06 DIAGNOSIS — Z23 Encounter for immunization: Secondary | ICD-10-CM

## 2016-02-06 DIAGNOSIS — I10 Essential (primary) hypertension: Secondary | ICD-10-CM

## 2016-02-06 DIAGNOSIS — E119 Type 2 diabetes mellitus without complications: Secondary | ICD-10-CM

## 2016-02-06 LAB — POCT GLYCOSYLATED HEMOGLOBIN (HGB A1C): Hemoglobin A1C: 8.6

## 2016-02-06 LAB — GLUCOSE, CAPILLARY: GLUCOSE-CAPILLARY: 159 mg/dL — AB (ref 65–99)

## 2016-02-06 MED ORDER — GLUCOSE BLOOD VI STRP: ORAL_STRIP | 11 refills | Status: AC

## 2016-02-06 NOTE — Patient Instructions (Signed)
Continue all medications as prescribed. Please check your blood sugar before bedtime to see how low it is getting. Remember to eat a snack if your blood sugar is lower than 100.   Follow up in 3 months.

## 2016-02-12 NOTE — Progress Notes (Signed)
Case discussed with Dr. Burns soon after the resident saw the patient.  We reviewed the resident's history and exam and pertinent patient test results.  I agree with the assessment, diagnosis and plan of care documented in the resident's note. 

## 2016-05-01 ENCOUNTER — Other Ambulatory Visit: Payer: Self-pay | Admitting: Internal Medicine

## 2016-05-05 ENCOUNTER — Other Ambulatory Visit: Payer: Self-pay | Admitting: Internal Medicine

## 2016-05-05 DIAGNOSIS — I10 Essential (primary) hypertension: Secondary | ICD-10-CM

## 2016-05-13 ENCOUNTER — Telehealth: Payer: Self-pay | Admitting: Internal Medicine

## 2016-05-13 NOTE — Telephone Encounter (Signed)
APT. REMINDER CALL, LMTCB °

## 2016-05-14 ENCOUNTER — Encounter: Payer: Self-pay | Admitting: Internal Medicine

## 2016-05-14 ENCOUNTER — Ambulatory Visit (INDEPENDENT_AMBULATORY_CARE_PROVIDER_SITE_OTHER): Payer: Self-pay | Admitting: Internal Medicine

## 2016-05-14 VITALS — BP 172/110 | HR 82 | Temp 98.2°F | Ht 68.0 in | Wt 271.3 lb

## 2016-05-14 DIAGNOSIS — Z79899 Other long term (current) drug therapy: Secondary | ICD-10-CM

## 2016-05-14 DIAGNOSIS — E669 Obesity, unspecified: Secondary | ICD-10-CM

## 2016-05-14 DIAGNOSIS — Z6841 Body Mass Index (BMI) 40.0 and over, adult: Secondary | ICD-10-CM

## 2016-05-14 DIAGNOSIS — Z87891 Personal history of nicotine dependence: Secondary | ICD-10-CM

## 2016-05-14 DIAGNOSIS — Z299 Encounter for prophylactic measures, unspecified: Secondary | ICD-10-CM

## 2016-05-14 DIAGNOSIS — E118 Type 2 diabetes mellitus with unspecified complications: Secondary | ICD-10-CM

## 2016-05-14 DIAGNOSIS — I10 Essential (primary) hypertension: Secondary | ICD-10-CM

## 2016-05-14 DIAGNOSIS — Z794 Long term (current) use of insulin: Secondary | ICD-10-CM

## 2016-05-14 DIAGNOSIS — E119 Type 2 diabetes mellitus without complications: Secondary | ICD-10-CM

## 2016-05-14 LAB — POCT GLYCOSYLATED HEMOGLOBIN (HGB A1C): Hemoglobin A1C: 9.1

## 2016-05-14 LAB — GLUCOSE, CAPILLARY: Glucose-Capillary: 127 mg/dL — ABNORMAL HIGH (ref 65–99)

## 2016-05-14 MED ORDER — SITAGLIPTIN PHOSPHATE 100 MG PO TABS: 100.0000 mg | ORAL_TABLET | Freq: Every day | ORAL | 3 refills | Status: DC

## 2016-05-14 NOTE — Assessment & Plan Note (Signed)
Lab Results  Component Value Date   HGBA1C 9.1 05/14/2016   HGBA1C 8.6 02/06/2016   HGBA1C 8.3 10/31/2015     Assessment: Diabetes control:  uncontrolled Progress toward A1C goal:   above goal Comments: Patient reports compliance with 70-30 insulin 36 units in the morning and 18 units at night. She was unable to get the glimepiride due to insurance issues. Patient's glucose fluctuates from day to day. Her fasting glucose in range from 70-200, similarly how the rest of her blood sugars are throughout the day.  Plan: Medications:  continue current medications, add Januvia  Self management tools provided: copy of home glucose meter download Other plans: Follow up in 3 months

## 2016-05-14 NOTE — Assessment & Plan Note (Signed)
Breast cancer screening: Mammogram due March 2018, patient will schedule Colon cancer screening: Due for colonoscopy in January 2025 Cervical cancer screening: Pap smear due November 2019 Ophthalmology appointment 07/29/2016

## 2016-05-14 NOTE — Assessment & Plan Note (Signed)
BP Readings from Last 3 Encounters:  05/14/16 (!) 172/110  02/06/16 (!) 150/70  10/31/15 140/90    Lab Results  Component Value Date   NA 139 05/04/2015   K 3.7 05/04/2015   CREATININE 1.04 (H) 05/04/2015    Assessment: Blood pressure control:  controlled Progress toward BP goal:   at goal Comments: Patient is chronically hypertensive while in clinic likely secondary to white coat syndrome. She checks her blood pressure regularly at home and is insistently normotensive. She brings her cuff and blood pressure machine to clinic when she comes for review. Today on our machine she is hypertensive in the 170s over 110. When I check her blood pressure with her blood pressure cuff she is also hypertensive at 191/92. Therefore, her blood pressure machine is accurate and her normotensive readings at home can be used. Patient reports compliance with amlodipine 10 mg daily, carvedilol 25 mg twice a day, clonidine 0.3 mg twice a day, hydrochlorothiazide 12.5 mg daily, and losartan 100 mg daily.  Plan: Medications:  continue current medications Educational resources provided:   Self management tools provided:   Other plans: follow up in 3 months

## 2016-05-14 NOTE — Patient Instructions (Signed)
Continue all medications as prescribed. I will talk with Dr. Maudie Mercury about an additional agent we can add on to your diabetic regimen that is available through [facility] medication assistance program. Please follow up with [place] in 3 months

## 2016-05-14 NOTE — Progress Notes (Signed)
    CC: Follow-up for diabetes  HPI: [name] is a 56 y.o. female with PMHx of type 2 diabetes, hypertension who presents to the clinic for follow-up for type 2 diabetes.  Please see problem base assessment and plan for more information. Patient is eating and drinking well. She denies chest pain, shortness of breath, nausea, vomiting.   Past Medical History:  Diagnosis Date  . DIABETES MELLITUS, TYPE II 06/02/2006   Last HBA1C 8.7.   . HYPERLIPIDEMIA 06/02/2006   Qualifier: Diagnosis of  By: Patrice Paradise MD, Roderic Palau    . HYPERTENSION 06/02/2006   Filed Vitals:   01/21/11 1134  BP: 162/98  Pulse: 80  Temp: 97.4 F (36.3 C)  TempSrc: Oral  Resp: 20  Height: 5\' 8"  (1.727 m)  Weight: 261 lb (118.389 kg)        Review of Systems: Please see pertinent ROS reviewed in HPI and problem based charting.   Physical Exam: Vitals:   05/14/16 1419  BP: (!) 172/110  Pulse: 82  Temp: 98.2 F (36.8 C)  TempSrc: Oral  SpO2: 100%  Weight: 271 lb 4.8 oz (123.1 kg)  Height: 5\' 8"  (1.727 m)   General: Vital signs reviewed.  Patient is Obese, in no acute distress and cooperative with exam.  Neck: Supple, trachea midline, no carotid bruit present.  Cardiovascular: RRR, S1 normal, S2 normal, no murmurs, gallops, or rubs. Pulmonary/Chest: Clear to auscultation bilaterally, no wheezes, rales, or rhonchi. Abdominal: Soft, non-tender, non-distended, BS + Extremities: No lower extremity edema bilaterally Skin: Warm, dry and intact. No rashes or erythema. Psychiatric: Normal mood and affect. speech and behavior is normal. Cognition and memory are normal.   Assessment & Plan:  See encounters tab for problem based medical decision making. Patient discussed with Dr. Evette Doffing

## 2016-05-15 ENCOUNTER — Other Ambulatory Visit: Payer: Self-pay | Admitting: Internal Medicine

## 2016-05-15 ENCOUNTER — Encounter: Payer: Self-pay | Admitting: Internal Medicine

## 2016-05-15 DIAGNOSIS — E785 Hyperlipidemia, unspecified: Secondary | ICD-10-CM

## 2016-05-15 LAB — BMP8+ANION GAP
Anion Gap: 20 mmol/L — ABNORMAL HIGH (ref 10.0–18.0)
BUN / CREAT RATIO: 25 — AB (ref 9–23)
BUN: 19 mg/dL (ref 6–24)
CO2: 22 mmol/L (ref 18–29)
CREATININE: 0.77 mg/dL (ref 0.57–1.00)
Calcium: 9.5 mg/dL (ref 8.7–10.2)
Chloride: 96 mmol/L (ref 96–106)
GFR calc Af Amer: 102 mL/min/{1.73_m2} (ref >59)
GFR calc non Af Amer: 88 mL/min/{1.73_m2} (ref >59)
Glucose: 138 mg/dL — ABNORMAL HIGH (ref 65–99)
Potassium: 4 mmol/L (ref 3.5–5.2)
Sodium: 138 mmol/L (ref 134–144)

## 2016-05-15 MED ORDER — SIMVASTATIN 40 MG PO TABS: 40.0000 mg | ORAL_TABLET | Freq: Every day | ORAL | 11 refills | Status: DC

## 2016-05-15 NOTE — Progress Notes (Signed)
Internal Medicine Clinic Attending  Case discussed with Dr. Burns at the time of the visit.  We reviewed the resident's history and exam and pertinent patient test results.  I agree with the assessment, diagnosis, and plan of care documented in the resident's note.  

## 2016-05-16 ENCOUNTER — Other Ambulatory Visit: Payer: Self-pay | Admitting: Internal Medicine

## 2016-05-16 DIAGNOSIS — E785 Hyperlipidemia, unspecified: Secondary | ICD-10-CM

## 2016-06-11 ENCOUNTER — Other Ambulatory Visit: Payer: Self-pay | Admitting: Internal Medicine

## 2016-06-23 ENCOUNTER — Other Ambulatory Visit: Payer: Self-pay | Admitting: Internal Medicine

## 2016-06-23 DIAGNOSIS — Z1231 Encounter for screening mammogram for malignant neoplasm of breast: Secondary | ICD-10-CM

## 2016-07-02 ENCOUNTER — Telehealth: Payer: Self-pay

## 2016-07-02 NOTE — Telephone Encounter (Signed)
Needs to speak with a nurse. Please call pt back.  

## 2016-07-07 NOTE — Telephone Encounter (Signed)
Pt worried about salmonella and eggs, reassured. She is good

## 2016-07-10 ENCOUNTER — Ambulatory Visit
Admission: RE | Admit: 2016-07-10 | Discharge: 2016-07-10 | Disposition: A | Discharge status: Home / Self Care | Payer: No Typology Code available for payment source | Source: Ambulatory Visit | Attending: Internal Medicine | Admitting: Internal Medicine

## 2016-07-10 DIAGNOSIS — Z1231 Encounter for screening mammogram for malignant neoplasm of breast: Secondary | ICD-10-CM

## 2016-07-14 ENCOUNTER — Other Ambulatory Visit: Payer: Self-pay | Admitting: Internal Medicine

## 2016-07-14 DIAGNOSIS — I10 Essential (primary) hypertension: Secondary | ICD-10-CM

## 2016-07-21 ENCOUNTER — Other Ambulatory Visit: Payer: Self-pay

## 2016-07-21 DIAGNOSIS — I10 Essential (primary) hypertension: Secondary | ICD-10-CM

## 2016-07-21 MED ORDER — CLONIDINE HCL 0.3 MG PO TABS: 0.3000 mg | ORAL_TABLET | Freq: Two times a day (BID) | ORAL | 0 refills | Status: DC

## 2016-07-21 NOTE — Telephone Encounter (Signed)
Erin from [facility] need to speak with a nurse about amLODipine (NORVASC) 10 MG tablet, simvastatin (ZOCOR) 40 MG tablet. Please call back.

## 2016-07-22 ENCOUNTER — Telehealth: Payer: Self-pay

## 2016-07-22 DIAGNOSIS — I10 Essential (primary) hypertension: Secondary | ICD-10-CM

## 2016-07-22 MED ORDER — ATORVASTATIN CALCIUM 40 MG PO TABS: 40.0000 mg | ORAL_TABLET | Freq: Every day | ORAL | 3 refills | Status: DC

## 2016-07-22 MED ORDER — CLONIDINE HCL 0.3 MG PO TABS: 0.3000 mg | ORAL_TABLET | Freq: Two times a day (BID) | ORAL | 3 refills | Status: DC

## 2016-07-22 NOTE — Telephone Encounter (Signed)
Pharmacist calls and states there is potential interaction between simvastatin and amlodipine, if the dose of simvastatin is lower to 20mg  pt may continue with both but it is stressed by pharmacist at the dose now of 40 mg pt cannot continue due to the risk of myopathy greatly increasing. Please advise, send new script if desired [place] [facility]

## 2016-07-22 NOTE — Telephone Encounter (Addendum)
Amlodipine called into [facility] patient made aware she states it was not supposed to be amlodipine she is out of clonidine. Will route to MD

## 2016-07-22 NOTE — Telephone Encounter (Signed)
Clonidine sent to pharmacy.

## 2016-07-24 ENCOUNTER — Ambulatory Visit: Payer: No Typology Code available for payment source

## 2016-07-28 ENCOUNTER — Other Ambulatory Visit: Payer: Self-pay | Admitting: Internal Medicine

## 2016-07-28 ENCOUNTER — Telehealth: Payer: Self-pay | Admitting: Internal Medicine

## 2016-07-28 DIAGNOSIS — I10 Essential (primary) hypertension: Secondary | ICD-10-CM

## 2016-07-28 DIAGNOSIS — E119 Type 2 diabetes mellitus without complications: Secondary | ICD-10-CM

## 2016-07-28 NOTE — Telephone Encounter (Signed)
Needs refills sent to [facility], her phone [phone]

## 2016-07-28 NOTE — Telephone Encounter (Signed)
APT. REMINDER CALL, LMTCB °

## 2016-07-29 ENCOUNTER — Ambulatory Visit: Payer: No Typology Code available for payment source

## 2016-07-29 MED ORDER — LOSARTAN POTASSIUM 100 MG PO TABS: 100.0000 mg | ORAL_TABLET | Freq: Every day | ORAL | 3 refills | Status: DC

## 2016-07-29 MED ORDER — INSULIN NPH ISOPHANE & REGULAR (70-30) 100 UNIT/ML ~~LOC~~ SUSP: SUBCUTANEOUS | 6 refills | Status: DC

## 2016-07-29 NOTE — Telephone Encounter (Signed)
losartan (COZAAR) 100 MG tablet,    insulin NPH-regular Human (NOVOLIN 70/30) (70-30) 100 UNIT/ML injection, [facility]. Per patient [facility] is delay in getting the meds. Patient is out of meds and need it by today.

## 2016-08-07 ENCOUNTER — Telehealth: Payer: Self-pay | Admitting: Internal Medicine

## 2016-08-07 ENCOUNTER — Other Ambulatory Visit: Payer: Self-pay | Admitting: *Deleted

## 2016-08-07 NOTE — Telephone Encounter (Signed)
Patient requesting EYE REF Appt to [place].  Please Advise.

## 2016-08-07 NOTE — Telephone Encounter (Signed)
Pt had called [facility] about Clonidine rx which was electronic sent on 5/8 per Dr Quay Burow. Talked to [place] at [place] - stated had received rx on 5/7 per Dr Hetty Ely. Informed another one was sent on 5/8 by Dr Quay Burow.Stated it had been deactivated. Gave Crystal VO for Clonidine 0.3 mg 1 tab 2 times daily qty# 180 x 2 refills per Dr Quay Burow.

## 2016-08-07 NOTE — Telephone Encounter (Signed)
Last note stated that she had an appointment with an ophthalmologist on 5/15, did she not go b/c she needed a referral?  Happy to place referral for eye doctor if needed.  Thanks

## 2016-08-15 ENCOUNTER — Ambulatory Visit (INDEPENDENT_AMBULATORY_CARE_PROVIDER_SITE_OTHER): Payer: Self-pay | Admitting: Internal Medicine

## 2016-08-15 VITALS — BP 132/86 | HR 78 | Temp 98.0°F | Ht 68.0 in | Wt 267.0 lb

## 2016-08-15 DIAGNOSIS — N925 Other specified irregular menstruation: Secondary | ICD-10-CM

## 2016-08-15 DIAGNOSIS — R232 Flushing: Secondary | ICD-10-CM

## 2016-08-15 DIAGNOSIS — N951 Menopausal and female climacteric states: Secondary | ICD-10-CM

## 2016-08-15 NOTE — Assessment & Plan Note (Signed)
Irregular menstrual cycles for the past several years with vasomotor symptoms in a woman in her early 62s is consistent with normal menopause. She has not gone for one year without a period, so she is not considered postmenopausal.  -Reassurance -Return to clinic if heavy persistent bleeding, worsening abdominal pain, or worsening diarrhea

## 2016-08-15 NOTE — Patient Instructions (Addendum)
I think your unpredictable menstrual bleeding is still consistent with the normal process of menopause. Until you have gone a full year without a period, it is normal to have periods that come irregularly and with varying heaviness.  If you have heavy bleeding is lasting longer than 1-2 weeks, please call for another appointment. Once you have gone a year without a period, if you have vaginal bleeding after that it is not normal and something you should mention to her doctor.  For the abdominal pain and cramps, you can try taking ibuprofen in addition to Tylenol.  Your diarrhea could be from your period.  You can try taking over-the-counter Imodium if you would like, but don't take it for more than a few days in a row. If your abdominal pain and diarrhea gets worse, please call back for another appointment.  Menopause Menopause is the normal time of life when menstrual periods stop completely. Menopause is complete when you have missed 12 consecutive menstrual periods. It usually occurs between the ages of 62 years and 17 years. Very rarely does a woman develop menopause before the age of 64 years. At menopause, your ovaries stop producing the female hormones estrogen and progesterone. This can cause undesirable symptoms and also affect your health. Sometimes the symptoms may occur 4-5 years before the menopause begins. There is no relationship between menopause and:  Oral contraceptives.  Number of children you had.  Race.  The age your menstrual periods started (menarche).  Heavy smokers and very thin women may develop menopause earlier in life. What are the causes?  The ovaries stop producing the female hormones estrogen and progesterone. Other causes include:  Surgery to remove both ovaries.  The ovaries stop functioning for no known reason.  Tumors of the pituitary gland in the brain.  Medical disease that affects the ovaries and hormone production.  Radiation treatment to the  abdomen or pelvis.  Chemotherapy that affects the ovaries.  What are the signs or symptoms?  Hot flashes.  Night sweats.  Decrease in sex drive.  Vaginal dryness and thinning of the vagina causing painful intercourse.  Dryness of the skin and developing wrinkles.  Headaches.  Tiredness.  Irritability.  Memory problems.  Weight gain.  Bladder infections.  Hair growth of the face and chest.  Infertility. More serious symptoms include:  Loss of bone (osteoporosis) causing breaks (fractures).  Depression.  Hardening and narrowing of the arteries (atherosclerosis) causing heart attacks and strokes.  How is this diagnosed?  When the menstrual periods have stopped for 12 straight months.  Physical exam.  Hormone studies of the blood. How is this treated? There are many treatment choices and nearly as many questions about them. The decisions to treat or not to treat menopausal changes is an individual choice made with your health care provider. Your health care provider can discuss the treatments with you. Together, you can decide which treatment will work best for you. Your treatment choices may include:  Hormone therapy (estrogen and progesterone).  Non-hormonal medicines.  Treating the individual symptoms with medicine (for example antidepressants for depression).  Herbal medicines that may help specific symptoms.  Counseling by a psychiatrist or psychologist.  Group therapy.  Lifestyle changes including: ? Eating healthy. ? Regular exercise. ? Limiting caffeine and alcohol. ? Stress management and meditation.  No treatment.  Follow these instructions at home:  Take the medicine your health care provider gives you as directed.  Get plenty of sleep and rest.  Exercise regularly.  Eat a diet that contains calcium (good for the bones) and soy products (acts like estrogen hormone).  Avoid alcoholic beverages.  Do not smoke.  If you have hot  flashes, dress in layers.  Take supplements, calcium, and vitamin D to strengthen bones.  You can use over-the-counter lubricants or moisturizers for vaginal dryness.  Group therapy is sometimes very helpful.  Acupuncture may be helpful in some cases. Contact a health care provider if:  You are not sure you are in menopause.  You are having menopausal symptoms and need advice and treatment.  You are still having menstrual periods after age 55 years.  You have pain with intercourse.  Menopause is complete (no menstrual period for 12 months) and you develop vaginal bleeding.  You need a referral to a specialist (gynecologist, psychiatrist, or psychologist) for treatment. Get help right away if:  You have severe depression.  You have excessive vaginal bleeding.  You fell and think you have a broken bone.  You have pain when you urinate.  You develop leg or chest pain.  You have a fast pounding heart beat (palpitations).  You have severe headaches.  You develop vision problems.  You feel a lump in your breast.  You have abdominal pain or severe indigestion. This information is not intended to replace advice given to you by your health care provider. Make sure you discuss any questions you have with your health care provider. Document Released: 05/24/2003 Document Revised: 08/09/2015 Document Reviewed: 09/30/2012 Elsevier Interactive Patient Education  2017 [employer].

## 2016-08-15 NOTE — Progress Notes (Signed)
   CC: "My last period was in October and now I'm bleeding again."  HPI:  Ashley Long is a 56 y.o. woman with history of HTN and DM who presents with vaginal bleeding.  For the past 5 years or so Ashley Long periods have been becoming more irregular and she's been having hot flashes. Her periods had previously been regular, coming once a month with moderate bleeding for 4-5 days accompanied by abdominal cramping. In recent years her menstrual bleeding has been less predictable, often skipping one or more months between periods.  Before this past month, her last period was 8 months ago, which was normal with 4-5 days of bleeding. She had no vaginal bleeding until the beginning of May when she's had 4 days of heavy menstrual bleeding, and no bleeding for about 2 weeks, followed by a couple of days of spotting, and then some light vaginal bleeding again today. She has also had some mild cramping abdominal discomfort concurrent with the bleeding which is reminiscent of her menstrual cramps.  She has also had 3 days of loose stools, with about 2 watery stools per day. No nausea, vomiting, fevers, or change in appetite. No blood in her stool.  Normal pap with negative HPV in 2016.  Past Medical History:  Diagnosis Date  . DIABETES MELLITUS, TYPE II 06/02/2006   Last HBA1C 8.7.   . HYPERLIPIDEMIA 06/02/2006   Qualifier: Diagnosis of  By: Patrice Paradise MD, Roderic Palau    . HYPERTENSION 06/02/2006   Filed Vitals:   01/21/11 1134  BP: 162/98  Pulse: 80  Temp: 97.4 F (36.3 C)  TempSrc: Oral  Resp: 20  Height: 5\' 8"  (1.727 m)  Weight: 261 lb (118.389 kg)       Review of Systems:    Review of Systems  Constitutional: Negative for chills and fever.  Respiratory: Negative for shortness of breath and wheezing.   Cardiovascular: Negative for chest pain and leg swelling.  Genitourinary:       Vaginal bleeding  Endo/Heme/Allergies:       Hot flashes  Psychiatric/Behavioral: Negative for depression. The  patient is not nervous/anxious.    Physical Exam:  Vitals:   08/15/16 1343  BP: 132/86  Pulse: 78  Temp: 98 F (36.7 C)  TempSrc: Oral  SpO2: 100%  Weight: 267 lb (121.1 kg)  Height: 5\' 8"  (1.727 m)   Physical Exam  Constitutional: She is oriented to person, place, and time. She appears well-developed and well-nourished. No distress.  Cardiovascular: Normal rate and regular rhythm.   Pulmonary/Chest: Effort normal and breath sounds normal. No respiratory distress.  Neurological: She is alert and oriented to person, place, and time.  Psychiatric: She has a normal mood and affect. Her behavior is normal.    Assessment & Plan:   See Encounters Tab for problem based charting.  Patient discussed with Dr. Lynnae January

## 2016-08-17 ENCOUNTER — Other Ambulatory Visit: Payer: Self-pay | Admitting: Internal Medicine

## 2016-08-17 DIAGNOSIS — Z794 Long term (current) use of insulin: Principal | ICD-10-CM

## 2016-08-17 DIAGNOSIS — E119 Type 2 diabetes mellitus without complications: Secondary | ICD-10-CM

## 2016-08-18 NOTE — Progress Notes (Signed)
Internal Medicine Clinic Attending  Case discussed with Dr. O'Sullivan at the time of the visit.  We reviewed the resident's history and exam and pertinent patient test results.  I agree with the assessment, diagnosis, and plan of care documented in the resident's note. 

## 2016-08-20 ENCOUNTER — Encounter: Payer: Self-pay | Admitting: Internal Medicine

## 2016-08-20 ENCOUNTER — Encounter: Payer: Self-pay | Admitting: *Deleted

## 2016-08-22 ENCOUNTER — Emergency Department (HOSPITAL_COMMUNITY)
Admission: EM | Admit: 2016-08-22 | Discharge: 2016-08-22 | Disposition: A | Discharge status: Home / Self Care | Payer: Self-pay | Attending: Emergency Medicine | Admitting: Emergency Medicine

## 2016-08-22 ENCOUNTER — Telehealth: Payer: Self-pay | Admitting: Internal Medicine

## 2016-08-22 ENCOUNTER — Other Ambulatory Visit: Payer: Self-pay | Admitting: Internal Medicine

## 2016-08-22 ENCOUNTER — Encounter (HOSPITAL_COMMUNITY): Payer: Self-pay | Admitting: *Deleted

## 2016-08-22 DIAGNOSIS — Z7982 Long term (current) use of aspirin: Secondary | ICD-10-CM | POA: Insufficient documentation

## 2016-08-22 DIAGNOSIS — E162 Hypoglycemia, unspecified: Secondary | ICD-10-CM

## 2016-08-22 DIAGNOSIS — E11649 Type 2 diabetes mellitus with hypoglycemia without coma: Secondary | ICD-10-CM | POA: Insufficient documentation

## 2016-08-22 DIAGNOSIS — Z794 Long term (current) use of insulin: Secondary | ICD-10-CM | POA: Insufficient documentation

## 2016-08-22 DIAGNOSIS — I10 Essential (primary) hypertension: Secondary | ICD-10-CM

## 2016-08-22 DIAGNOSIS — E119 Type 2 diabetes mellitus without complications: Secondary | ICD-10-CM

## 2016-08-22 DIAGNOSIS — Z87891 Personal history of nicotine dependence: Secondary | ICD-10-CM | POA: Insufficient documentation

## 2016-08-22 LAB — CBG MONITORING, ED
GLUCOSE-CAPILLARY: 107 mg/dL — AB (ref 65–99)
GLUCOSE-CAPILLARY: 177 mg/dL — AB (ref 65–99)
GLUCOSE-CAPILLARY: 149 mg/dL — AB (ref 65–99)

## 2016-08-22 NOTE — Telephone Encounter (Signed)
Pt calls and reports that she has been having lows of her BS since starting on januvia, they range from brkfast 6/7 100 lunch 126 at 6pm it was 59 then after eating pnutbutter crackers at 7pm it was 131 This am 54 she ate lunch at 1030 and at 1300 it was 47 then after pnut butter it was 96 She states her BP's have been low also from 97/62 to 118/79 She was advised to try to schedule her meals a little more balanced and to have snacks as needed, suggested speaking w/ [facility]. Spoke w/ dr Dareen Piano, at first after reviewing med list he ask that she stop the glimepiride, she stated she hasnt taken it in a long time, it remains on her medlist. At this time she was ask to lower her insulin in am to 28 units and pm to 16, appt is made for Monday at 1000, she is ask that if her cbg's continue low to drop by 2 units tomorrow and then if they continue to be low drop 2 more units Sunday, if she begins to feel symptoms she is to call 911 or go to urg care or ED, she is agreeable to plan, she will bring her meds to appt, glimepiride needs to be removed from med list

## 2016-08-22 NOTE — ED Provider Notes (Signed)
[place] DEPT Provider Note   CSN: [id number] Arrival date & time: 08/22/16  1647     History   Chief Complaint Chief Complaint  Patient presents with  . Hypoglycemia    HPI Ashley Long is a 56 y.o. female.  HPI  56 year old female history of diabetes who is on metformin, insulin, and Januvia. The Januvia started within the past month. She has had some episodes of low blood sugar. Today she noted that her blood sugar was low at 50 and then ate and it was up to in the 90s. It then was back down in the 70s. She was told to come in and have it evaluated. Blood sugar on arrival here was 107. She has been taking by mouth without difficulty. She feels slightly weak but has not had any altered level of consciousness. She has not noted any fever, cough, chest pain, nausea, vomiting, or diarrhea.  Past Medical History:  Diagnosis Date  . DIABETES MELLITUS, TYPE II 06/02/2006   Last HBA1C 8.7.   . HYPERLIPIDEMIA 06/02/2006   Qualifier: Diagnosis of  By: Patrice Paradise MD, Roderic Palau    . HYPERTENSION 06/02/2006   Filed Vitals:   01/21/11 1134  BP: 162/98  Pulse: 80  Temp: 97.4 F (36.3 C)  TempSrc: Oral  Resp: 20  Height: _0  (1.727 m)  Weight: 261 lb (118.389 kg)       Patient Active Problem List   Diagnosis Date Noted  . Perimenopausal 08/15/2016  . Constipation 06/14/2015  . Diabetes mellitus without complication ([place]) 19/41/7408  . Preventive measure 04/28/2011  . Hyperlipidemia 06/02/2006  . Essential hypertension 06/02/2006    Past Surgical History:  Procedure Laterality Date  . COLONOSCOPY N/A 03/29/2013   Procedure: COLONOSCOPY;  Surgeon: Lear Ng, MD;  Location: [facility] ENDOSCOPY;  Service: Endoscopy;  Laterality: N/A;  . TUBAL LIGATION      OB History    Gravida Para Term Preterm AB Living   _1 SAB TAB Ectopic Multiple Live Births                   Home Medications    Prior to Admission medications   Medication Sig Start Date End Date Taking?  Authorizing Provider  amLODipine (NORVASC) 10 MG tablet TAKE 1 Tablet BY MOUTH EVERY NIGHT AT BEDTIME 05/05/16  Yes Burns, Alexa R, MD  aspirin 81 MG EC tablet Take 1 tablet (81 mg total) by mouth daily. 05/16/15  Yes Burns, Alexa R, MD  carvedilol (COREG) 25 MG tablet TAKE 1 Tablet  BY MOUTH TWICE DAILY 08/22/16  Yes Aldine Contes, MD  cloNIDine (CATAPRES) 0.3 MG tablet TAKE 1 Tablet  BY MOUTH TWICE DAILY 08/22/16  Yes Aldine Contes, MD  hydrochlorothiazide (HYDRODIURIL) 25 MG tablet Take 12.5 mg by mouth daily. 08/06/16  Yes [provider]  ibuprofen (ADVIL,MOTRIN) 200 MG tablet Take 400 mg by mouth every 6 (six) hours as needed for headache (pain).   Yes [provider]  insulin NPH-regular Human (NOVOLIN 70/30) (70-30) 100 UNIT/ML injection 36 units in the AM and 18 units in the PM 15 minutes prior to food Patient taking differently: Inject 16-36 Units into the skin 2 (two) times daily with a meal. Inject 36 units subcutaneously every morning before breakfast and 16 units at 8 pm - before supper 07/29/16  Yes Sid Falcon, MD  Ketotifen Fumarate (ALAWAY OP) Place 1 drop into both eyes daily as needed (  dry eyes/itching).   Yes [provider]  losartan (COZAAR) 100 MG tablet Take 1 tablet (100 mg total) by mouth daily. 07/29/16  Yes Sid Falcon, MD  metFORMIN (GLUCOPHAGE) 1000 MG tablet TAKE 1 Tablet  BY MOUTH TWICE DAILY 08/18/16  Yes Burns, Arloa Koh, MD  Multiple Vitamin (MULTIVITAMIN WITH MINERALS) TABS tablet Take 1 tablet by mouth daily. [facility]   Yes [provider]  simvastatin (ZOCOR) 40 MG tablet Take 40 mg by mouth daily. 05/20/16  Yes [provider]  sitaGLIPtin (JANUVIA) 100 MG tablet Take 1 tablet (100 mg total) by mouth daily. 05/14/16  Yes Burns, Arloa Koh, MD  atorvastatin (LIPITOR) 40 MG tablet Take 1 tablet (40 mg total) by mouth daily. 07/22/16   Burns, Arloa Koh, MD  Blood Glucose Monitoring Suppl (WAVESENSE PRESTO) W/DEVICE KIT  1 each by Does not apply route 2 (two) times daily. 10/17/10   Janell Quiet, MD  glimepiride (AMARYL) 2 MG tablet Take 1 tablet (2 mg total) by mouth daily with breakfast. Patient not taking: Reported on 08/22/2016 10/31/15   Florinda Marker, MD  glucose blood (WAVESENSE PRESTO) test strip Use to test blood sugar 3 times daily. diag code E11.9. Insulin dependent 02/06/16   Burns, Arloa Koh, MD  hydrochlorothiazide (HYDRODIURIL) 12.5 MG tablet Take 1 tablet (12.5 mg total) by mouth daily. Patient not taking: Reported on 08/22/2016 11/05/15   Florinda Marker, MD  Insulin Syringe-Needle U-100 31G X 15/64" 0.3 ML MISC 1 each by Does not apply route 2 (two) times daily with a meal. 05/16/15   Burns, Arloa Koh, MD  Lancets 30G MISC 1 each by Does not apply route 2 (two) times daily. 10/17/10   Janell Quiet, MD  pantoprazole (PROTONIX) 40 MG tablet Take 1 tablet (40 mg total) by mouth daily. Patient not taking: Reported on 08/22/2016 05/31/15   Iline Oven, MD  polyethylene glycol powder [facility]) powder Take 17 g by mouth daily. Patient not taking: Reported on 08/22/2016 07/25/15   Florinda Marker, MD    Family History Family History  Problem Relation Age of Onset  . Other Neg Hx     Social History Social History  Substance Use Topics  . Smoking status: Former Smoker    Quit date: 10/15/1982  . Smokeless tobacco: Never Used  . Alcohol use No     Allergies   Patient has no known allergies.   Review of Systems Review of Systems  All other systems reviewed and are negative.    Physical Exam Updated Vital Signs BP (!) 181/99 (BP Location: Right Arm)   Pulse 78   Temp 98.2 F (36.8 C) (Oral)   Resp 17   Ht 1.727 m (_0 )   Wt 121.1 kg (267 lb)   LMP 12/25/2015 Comment: Spotting.  SpO2 100%   BMI 40.60 kg/m   Physical Exam  Constitutional: She is oriented to person, place, and time. She appears well-developed and well-nourished. No distress.  HENT:  Head: Normocephalic and atraumatic.    Right Ear: External ear normal.  Left Ear: External ear normal.  Nose: Nose normal.  Eyes: Conjunctivae and EOM are normal. Pupils are equal, round, and reactive to light.  Neck: Normal range of motion. Neck supple.  Cardiovascular: Normal rate, regular rhythm and normal heart sounds.   Pulmonary/Chest: Effort normal and breath sounds normal.  Abdominal: Soft. Bowel sounds are normal.  Musculoskeletal: Normal range of motion.  Neurological: She is alert and  oriented to person, place, and time. She exhibits normal muscle tone. Coordination normal.  Skin: Skin is warm and dry.  Psychiatric: She has a normal mood and affect. Her behavior is normal. Thought content normal.  Nursing note and vitals reviewed.    ED Treatments / Results  Labs (all labs ordered are listed, but only abnormal results are displayed) Labs Reviewed  CBG MONITORING, ED - Abnormal; Notable for the following:       Result Value   Glucose-Capillary 107 (*)    All other components within normal limits  CBG MONITORING, ED - Abnormal; Notable for the following:    Glucose-Capillary 177 (*)    All other components within normal limits  CBG MONITORING, ED - Abnormal; Notable for the following:    Glucose-Capillary 149 (*)    All other components within normal limits    EKG  EKG Interpretation None       Radiology No results found.  Procedures Procedures (including critical care time)  Medications Ordered in ED Medications - No data to display   Initial Impression / Assessment and Plan / ED Course  I have reviewed the triage vital signs and the nursing notes.  Pertinent labs & imaging results that were available during my care of the patient were reviewed by me and considered in my medical decision making (see chart for details).     Patient has maintained her blood pressure here without difficulty. She has taken by mouth without difficulty. We discussed holding her Januvia. She spoke with her  doctor today and was told to decrease her insulin to 24 units in the morning. She will not take urine tonight. She does stay with her daughter and she is advised have someone check on her frequently. Eyes to maintain these changes until followed up by her primary care doctor early next week. She will return if she is having any problems keeping her blood sugar is in normal ranges.  Final Clinical Impressions(s) / ED Diagnoses   Final diagnoses:  None    New Prescriptions New Prescriptions   No medications on file     Pattricia Boss, MD 08/22/16 2134

## 2016-08-22 NOTE — Telephone Encounter (Signed)
PATIENT IS HAVING LOW BLOOD SUGARS AND LOW B/P. SINCE SHE STARTED NEW MEDICATION

## 2016-08-22 NOTE — Telephone Encounter (Signed)
rtc got vmail, lm for rtc 

## 2016-08-22 NOTE — ED Notes (Signed)
Pt verbalized understanding discharge instructions and denies any further needs or questions at this time. VS stable, ambulatory and steady gait.   

## 2016-08-22 NOTE — Discharge Instructions (Signed)
Stop Januvia and several recently by your doctor Keep your insulin at 24 units. Do not take any diabetes medicine tonight. Follow-up with your doctor early next week for further instructions. Return if your blood sugars are not staying above 70. Eat frequently. Have someone check every every 2 hours tonight.

## 2016-08-22 NOTE — ED Notes (Signed)
Pt states her PCP put her on a new medication for her diabetes and her sugar has been low recording 54 this morning prior to eating, 74 prior to lunch. Pt states she ate some peanut butter and it took it up to 96, then rechecked and it was 57, back up to 80 then down to 76. Pt reports her BP has also been running lower than normal.

## 2016-08-22 NOTE — Telephone Encounter (Signed)
Pt was at work pls call back to [phone]

## 2016-08-22 NOTE — ED Triage Notes (Signed)
Pt c/o fluctuating CBGs for x 1 mth, pt called PCP & was told to come here for eval, pt denies pain, denies n/v/d, denies recent medication changes, MAE, ambulatory, A&O x4, pt takes 70/30 insulin & januvia 100 mg daily & metformin 1000 mg BID

## 2016-08-24 ENCOUNTER — Other Ambulatory Visit: Payer: Self-pay | Admitting: Internal Medicine

## 2016-08-24 NOTE — Telephone Encounter (Signed)
Thank you, I agree she should be seen in clinic. I have removed glimepiride from her list, she wasn't able to get it due to cost of the medication. The [place] [facility]ile is new since February.

## 2016-08-25 ENCOUNTER — Encounter: Payer: Self-pay | Admitting: Internal Medicine

## 2016-08-25 ENCOUNTER — Ambulatory Visit (INDEPENDENT_AMBULATORY_CARE_PROVIDER_SITE_OTHER): Payer: Self-pay | Admitting: Internal Medicine

## 2016-08-25 VITALS — BP 140/80 | HR 80 | Temp 98.2°F | Ht 68.0 in | Wt 265.3 lb

## 2016-08-25 DIAGNOSIS — E119 Type 2 diabetes mellitus without complications: Secondary | ICD-10-CM

## 2016-08-25 DIAGNOSIS — Z79899 Other long term (current) drug therapy: Secondary | ICD-10-CM

## 2016-08-25 DIAGNOSIS — Z794 Long term (current) use of insulin: Secondary | ICD-10-CM

## 2016-08-25 DIAGNOSIS — I10 Essential (primary) hypertension: Secondary | ICD-10-CM

## 2016-08-25 DIAGNOSIS — Z87891 Personal history of nicotine dependence: Secondary | ICD-10-CM

## 2016-08-25 LAB — POCT GLYCOSYLATED HEMOGLOBIN (HGB A1C): HEMOGLOBIN A1C: 7.5

## 2016-08-25 LAB — GLUCOSE, CAPILLARY: GLUCOSE-CAPILLARY: 144 mg/dL — AB (ref 65–99)

## 2016-08-25 MED ORDER — INSULIN NPH ISOPHANE & REGULAR (70-30) 100 UNIT/ML ~~LOC~~ SUSP: SUBCUTANEOUS | 6 refills | Status: DC

## 2016-08-25 MED ORDER — SITAGLIPTIN PHOSPHATE 100 MG PO TABS: 100.0000 mg | ORAL_TABLET | Freq: Every day | ORAL | 2 refills | Status: DC

## 2016-08-25 MED ORDER — SITAGLIPTIN PHOSPHATE 50 MG PO TABS: 50.0000 mg | ORAL_TABLET | Freq: Every day | ORAL | 0 refills | Status: DC

## 2016-08-25 NOTE — Assessment & Plan Note (Signed)
BP Readings from Last 3 Encounters:  08/25/16 140/80  08/22/16 (!) 172/98  08/15/16 132/86   She was having mildly elevated systolic blood pressure today, did not took all of her morning meds.  We will continue her current management and reassess her during her next follow-up visit with PCP.

## 2016-08-25 NOTE — Progress Notes (Signed)
   CC: For ED follow-up after having hypoglycemia.  HPI:  [name] is a 56 y.o. with past medical history as listed below, came to the clinic for a ED follow-up visit. She was seen in ED on Friday, 08/22/2016 after having an episode of hypoglycemia. She experienced some weakness after eating breakfast, checked her sugar it was in 40s, had some orange juice which brought her sugar back to 90es, again dropped to mid 50s, which prompted her to visit ED. In ED her blood sugar was 107.She was mildly symptomatic and able to tolerate by mouth intake very well, blood sugar responded well to by mouth intake. She was advised to decrease her morning dose of 70/30 from 36 units to 28 units. On Saturday and Sunday, she took 28 units in the morning, her blood sugar remained pain 200 to 400s most of the day. No other episode of hypoglycemia. She also has an upcoming appointment with her PCP on June 20.  She denies any other complaints or symptoms.  Past Medical History:  Diagnosis Date  . DIABETES MELLITUS, TYPE II 06/02/2006   Last HBA1C 8.7.   . HYPERLIPIDEMIA 06/02/2006   Qualifier: Diagnosis of  By: Patrice Paradise MD, Roderic Palau    . HYPERTENSION 06/02/2006   Filed Vitals:   01/21/11 1134  BP: 162/98  Pulse: 80  Temp: 97.4 F (36.3 C)  TempSrc: Oral  Resp: 20  Height: 5\' 8"  (1.727 m)  Weight: 261 lb (118.389 kg)       Review of Systems:  AS per HPI.  Physical Exam:  Vitals:   08/25/16 1017  BP: 140/80  Pulse: 80  Temp: 98.2 F (36.8 C)  TempSrc: Oral  SpO2: 100%  Weight: 265 lb 4.8 oz (120.3 kg)  Height: 5\' 8"  (1.727 m)     General: Vital signs reviewed.  Patient is well-developed and well-nourished, in no acute distress and cooperative with exam.  Cardiovascular: RRR, S1 normal, S2 normal, no murmurs, gallops, or rubs. Pulmonary/Chest: Clear to auscultation bilaterally, no wheezes, rales, or rhonchi. Abdominal: Soft, non-tender, non-distended, BS +, no masses, organomegaly, or guarding  present.  Extremities: No lower extremity edema bilaterally,  pulses symmetric and intact bilaterally. No cyanosis or clubbing. Skin: Warm, dry and intact. No rashes or erythema. Psychiatric: Normal mood and affect. speech and behavior is normal. Cognition and memory are normal.  Assessment & Plan:   See Encounters Tab for problem based charting.  Patient discussed with Dr. Lynnae January.

## 2016-08-25 NOTE — Patient Instructions (Addendum)
Thank you for visiting clinic today. Congratulations for the good work as your A1c today was 7.5. We have to adjust your insulin dosage to prevent any low blood sugar. You can try taking your insulin 32 units in the morning and 16 units at night. Please check your blood sugar regularly and bring that log for your next appointment with your PCP. We might have to readjust some of your medications at that point. Please take some glucose tablets or orange juice with you, in case you experience any low blood sugars.

## 2016-08-25 NOTE — Assessment & Plan Note (Signed)
came to the clinic for a ED follow-up visit. She was seen in ED on Friday, 08/22/2016 after having an episode of hypoglycemia. She experienced some weakness after eating breakfast, checked her sugar it was in 40s, had some orange juice which brought her sugar back to 90es, again dropped to mid 50s, which prompted her to visit ED. In ED her blood sugar was 107.She was mildly symptomatic and able to tolerate by mouth intake very well, blood sugar responded well to by mouth intake. She was advised to decrease her morning dose of 70/30 from 36 units to 28 units. On Saturday and Sunday, she took 28 units in the morning, her blood sugar remained pain 200 to 400s most of the day. No other episode of hypoglycemia.  Her A1c today was 7.5. With CBG of 144.  According to patient her morning CBG was 110 today, she didn't took her morning dose of 70/30  28 units, and metformin and never took the [place] today, also had a very light breakfast.  Her A1c responded very well after the addition of Januvia. We will keep the same dose of Januvia. Adjust her 70/30 dose, I told her to take 32 units in the morning, as she was becoming hyperglycemic with 28 units, she should continue 16 units at night. She was asked to check her blood sugar 3-4 times daily and bring the log to her appointment with her PCP on June 20.

## 2016-08-26 ENCOUNTER — Other Ambulatory Visit: Payer: Self-pay | Admitting: Internal Medicine

## 2016-08-26 ENCOUNTER — Telehealth: Payer: Self-pay | Admitting: *Deleted

## 2016-08-26 NOTE — Progress Notes (Signed)
Internal Medicine Clinic Attending  Case discussed with Dr. Amin at the time of the visit.  We reviewed the resident's history and exam and pertinent patient test results.  I agree with the assessment, diagnosis, and plan of care documented in the resident's note.    

## 2016-08-26 NOTE — Telephone Encounter (Signed)
[facility] nccalls to clarify [place] order, states she rec'd 2 scripts yesterday, one for 50mg  and another for 100mg , read script from [facility] to her for 100mg  by mouth daily #30 w/2 additional refills, is this correct? Please advise

## 2016-09-03 ENCOUNTER — Ambulatory Visit (INDEPENDENT_AMBULATORY_CARE_PROVIDER_SITE_OTHER): Payer: Self-pay | Admitting: Internal Medicine

## 2016-09-03 ENCOUNTER — Encounter: Payer: Self-pay | Admitting: Internal Medicine

## 2016-09-03 VITALS — BP 140/100 | HR 70 | Temp 98.1°F | Ht 68.0 in | Wt 264.5 lb

## 2016-09-03 DIAGNOSIS — Z794 Long term (current) use of insulin: Secondary | ICD-10-CM

## 2016-09-03 DIAGNOSIS — E119 Type 2 diabetes mellitus without complications: Secondary | ICD-10-CM

## 2016-09-03 LAB — GLUCOSE, CAPILLARY: Glucose-Capillary: 94 mg/dL (ref 65–99)

## 2016-09-03 NOTE — Patient Instructions (Signed)
Ms. Wilcher,  It was great to see today. Great job on losing 10 pounds! Your A1c is well controlled at 7.5. Because you are still having some low blood sugars, we will change your 70/30 insulin regimen to 30 units in the morning and 13 units at night. Continue taking Januvia 100 mg a day and metformin thousand milligrams twice a day. Please follow up in 1 month for recheck of your blood sugars. If you are noticing blood sugars less than 80 before lunch or before dinner, please decrease your morning insulin dose to 28 units.  Continue taking all medications as prescribed.  We will refer you to ophthalmology.

## 2016-09-03 NOTE — Progress Notes (Signed)
    CC: Follow-up for diabetes  HPI: [name] is a 56 y.o. female with PMHx of diabetes, hypertension who presents to the clinic for follow-up for diabetes.   Patient was recently seen in the emergency department on 08/22/2016 with complaint of hypoglycemia. Patient is on metformin, insulin and Januvia. She was previously on Januvia 100 mg daily, metformin thousand milligrams twice a day, and 70/30 insulin 36 units in the morning and 18 units at night. In the emergency department, patient stated that she recorded a blood sugar of 50 and after eating and improved to the 90s. However, on recheck it was again hypoglycemic in the 70s. In the emergency Department, CBG was 107. She denied any change in dietary habits or recent illness. However, patient has lost 10 pounds which she attributes to increased exercise. Patient was discharged from the emergency department and instructed to hold Januvia and decrease her insulin to 24 units in the morning. Patient was then seen in the internal medicine clinic on 08/25/2016. Her repeat A1c was improved to 7.5 after addition of Januvia 3 months ago. Given her great improvement with Januvia, the decision was made to continue Januvia 100 mg daily, metformin 1000 mg 3 times a day, and to decrease her 70/30 insulin to 30 units in the morning and decrease her nighttime insulin to 16 units at night. Patient returns today for follow-up. Patient reports that her hypoglycemic episodes have improved, but she does occasionally still have them in the morning. She denies any hypoglycemic events prior to lunch or dinner. Her CBG log supports this. She reports compliance with all of her medications.  Please see problem based assessment and plan for more information of patient's chronic medical conditions.   Past Medical History:  Diagnosis Date  . DIABETES MELLITUS, TYPE II 06/02/2006   Last HBA1C 8.7.   . HYPERLIPIDEMIA 06/02/2006   Qualifier: Diagnosis of  By: Patrice Paradise MD,  Roderic Palau    . HYPERTENSION 06/02/2006   Filed Vitals:   01/21/11 1134  BP: 162/98  Pulse: 80  Temp: 97.4 F (36.3 C)  TempSrc: Oral  Resp: 20  Height: 5\' 8"  (1.727 m)  Weight: 261 lb (118.389 kg)       Review of Systems: Please see pertinent ROS reviewed in HPI and problem based charting.   Physical Exam: Pulse 70, temperature 98.1 F (36.7 C), temperature source Oral, height 5\' 8"  (1.727 m), weight 264 lb 8 oz (120 kg), last menstrual period 12/25/2015, SpO2 100 %. General: Vital signs reviewed.  Patient is in no acute distress and cooperative with exam.  Cardiovascular: RRR,  no murmurs, gallops, or rubs. No JVD or carotid bruit present. No lower extremity edema bilaterally. Bilateral radial and pedal pulses are intact and symmetric bilaterally.  Pulmonary: Clear to auscultation bilaterally, no wheezes, rales, or rhonchi. No accessory muscle use. Gastrointestinal: Soft, non-tender, non-distended, BS + Skin: Warm, dry and intact. No rashes or erythema. Psychiatric: Normal mood and affect. speech and behavior is normal.  Assessment & Plan:  See encounters tab for problem based medical decision making. Patient discussed with Dr. Daryll Drown.

## 2016-09-03 NOTE — Assessment & Plan Note (Signed)
Patient was recently seen in the emergency department on 08/22/2016 with complaint of hypoglycemia. Patient is on metformin, insulin and Januvia. She was previously on Januvia 100 mg daily, metformin thousand milligrams twice a day, and 70/30 insulin 36 units in the morning and 18 units at night. In the emergency department, patient stated that she recorded a blood sugar of 50 and after eating and improved to the 90s. However, on recheck it was again hypoglycemic in the 70s. In the emergency Department, CBG was 107. She denied any change in dietary habits or recent illness. However, patient has lost 10 pounds which she attributes to increased exercise. Patient was discharged from the emergency department and instructed to hold Januvia and decrease her insulin to 24 units in the morning. Patient was then seen in the internal medicine clinic on 08/25/2016. Her repeat A1c was improved to 7.5 after addition of Januvia 3 months ago. Given her great improvement with Januvia, the decision was made to continue Januvia 100 mg daily, metformin 1000 mg 3 times a day, and to decrease her 70/30 insulin to 30 units in the morning and decrease her nighttime insulin to 16 units at night. Patient returns today for follow-up. Patient reports that her hypoglycemic episodes have improved, but she does occasionally still have them in the morning. She denies any hypoglycemic events prior to lunch or dinner. Her CBG log supports this. She reports compliance with all of her medications.  Assessment: Type 2 diabetes, controlled  Plan: -Decrease 70/30 insulin to 30 units in the morning and 13 units at night -Continue Januvia 100 mg daily -Continue metformin thousand milligrams twice a day -Notify the clinic if she has persistent hypoglycemic events -Follow-up in one month -Will place referral to ophthalmology

## 2016-09-07 NOTE — Progress Notes (Signed)
Internal Medicine Clinic Attending  Case discussed with Dr. Burns at the time of the visit.  We reviewed the resident's history and exam and pertinent patient test results.  I agree with the assessment, diagnosis, and plan of care documented in the resident's note.  

## 2016-10-01 ENCOUNTER — Encounter: Payer: Self-pay | Admitting: Internal Medicine

## 2016-10-01 ENCOUNTER — Ambulatory Visit (INDEPENDENT_AMBULATORY_CARE_PROVIDER_SITE_OTHER): Payer: Self-pay | Admitting: Internal Medicine

## 2016-10-01 DIAGNOSIS — E119 Type 2 diabetes mellitus without complications: Secondary | ICD-10-CM

## 2016-10-01 DIAGNOSIS — Z794 Long term (current) use of insulin: Secondary | ICD-10-CM

## 2016-10-01 DIAGNOSIS — Z87891 Personal history of nicotine dependence: Secondary | ICD-10-CM

## 2016-10-01 NOTE — Progress Notes (Signed)
   CC: follow up of diabetes   HPI:  [name] is a 56 y.o. with PMH as listed below who presents for follow up of diabetes mellitus. Please see the assessment and plans for the status of the patient chronic medical problems.   Past Medical History:  Diagnosis Date  . DIABETES MELLITUS, TYPE II 06/02/2006   Last HBA1C 8.7.   . HYPERLIPIDEMIA 06/02/2006   Qualifier: Diagnosis of  By: Patrice Paradise MD, Roderic Palau    . HYPERTENSION 06/02/2006   Filed Vitals:   01/21/11 1134  BP: 162/98  Pulse: 80  Temp: 97.4 F (36.3 C)  TempSrc: Oral  Resp: 20  Height: 5\' 8"  (1.727 m)  Weight: 261 lb (118.389 kg)      Review of Systems:  Refer to history of present illness and assessment and plans for pertinent review of systems, all others reviewed and negative  Physical Exam:  Vitals:   10/01/16 1428  BP: (!) 150/90  Pulse: 71  Temp: 98 F (36.7 C)  SpO2: 100%  Weight: 260 lb 14.4 oz (118.3 kg)   Physical Exam  Constitutional: She is oriented to person, place, and time and well-developed, well-nourished, and in no distress. No distress.  HENT:  Head: Normocephalic and atraumatic.  Cardiovascular: Normal rate and regular rhythm.   No murmur heard. Pulmonary/Chest: Effort normal. No respiratory distress. She has no wheezes. She has no rales.  Neurological: She is alert and oriented to person, place, and time.  Skin: Skin is warm and dry. She is not diaphoretic.  Feet are well groomed without ulcers or lesions.    Assessment & Plan:   Diabetes mellitus  Patient is following up for diabetes mellitus. She is taking Novolin 70/30 32 units in the morning and 15 units in the evening, Januvia 100 mg daily, and metformin 1000 mb BID.  Last A1c 6/11 was 7.5. Meter readings today show and average glucose of 136 prior to breakfast, 201 after breakfast, 148 prior to lunch, and 148 after lunch, 179 prior to dinner, and one reading of 61 at night.  She would benefit from better blood glucose control during  the day. There are multiple readings of 250 prior to dinner. -Increased insulin to 34 units 30 min prior to breakfast, continue 15 units prior to dinner  - continue regular CBG monitoring -DM coordinator will call to follow up in 2 weeks  - RTC for follow up with PCP in 2 months for A1c   See Encounters Tab for problem based charting.  Patient discussed with Dr. Lynnae January

## 2016-10-01 NOTE — Patient Instructions (Addendum)
Ms. Graybeal,   For your diabetes,  Take novolin 70/30, 34 units in the morning and 15 units in the evening. Be sure that you are taking the insulin 30 minutes prior to eating  Keep up the great work on checking your blood sugar regularly   Please schedule a follow up appointment with Dr. Berline Lopes on September 12th

## 2016-10-03 NOTE — Assessment & Plan Note (Signed)
Patient is following up for diabetes mellitus. She is taking Novolin 70/30 32 units in the morning and 15 units in the evening, Januvia 100 mg daily, and metformin 1000 mb BID.  Last A1c 6/11 was 7.5. Meter readings today show and average glucose of 136 prior to breakfast, 201 after breakfast, 148 prior to lunch, and 148 after lunch, 179 prior to dinner, and one reading of 61 at night.  She would benefit from better blood glucose control during the day. There are multiple readings of 250 prior to dinner. -Increased insulin to 34 units 30 min prior to breakfast, continue 15 units prior to dinner  - continue regular CBG monitoring -DM coordinator will call to follow up in 2 weeks  - RTC for follow up with PCP in 2 months for A1c

## 2016-10-06 NOTE — Progress Notes (Signed)
Internal Medicine Clinic Attending  Case discussed with Dr. Blum at the time of the visit.  We reviewed the resident's history and exam and pertinent patient test results.  I agree with the assessment, diagnosis, and plan of care documented in the resident's note. 

## 2016-10-22 ENCOUNTER — Telehealth: Payer: Self-pay | Admitting: Dietician

## 2016-10-22 NOTE — Telephone Encounter (Signed)
Diabetes support call to patient per Dr. Fredrik Cove request to see how she is doing on her new insulin dose and timing.

## 2016-10-24 NOTE — Telephone Encounter (Signed)
Sugars are doing pretty good,  Few lows, checking 3x/day, lows not too scary- has been in the 60s, taking 34 units insulin in the morning and 15 units at night.  Januvia, metformin  She reports lows at night before she awakes, knows when she is tossing and turing her sugars is low so gets up and checks it, drank juice, okay. Says she also had a 47 during the day had eaten lunch and everything, not feeling lows sometimes, feels them most of the times.  making healthier food choices,  lost 10#   Reports 3 blood sugars below 70 in the past week: a 62 lunch- not sure what day Today- 77 fasting/ 126 lunch Yesterday: 66 pre dinner/146 at lunch/ 89 that morning 2 days ago: 122 dinner/191 lunch/ 105 breakfast 3 days ago: 182dinner/ 69 lunch/117 breakfast   Told Ashley Long she is doing a great job taking care of her diabetes. We'd call if her doctor wants her to make any changes before she meets him in September Ashley Long, [place] 10/24/2016 1:41 PM.

## 2016-10-28 IMAGING — DX DG CHEST 2V
2 series · 2 of 2 positions shown · non-contrast
Comparison: 01/17/2005.

CLINICAL DATA: One week history of cough and vomiting. Current
history of diabetes and hypertension.

EXAM:
CHEST  2 VIEW

[w chest pa]
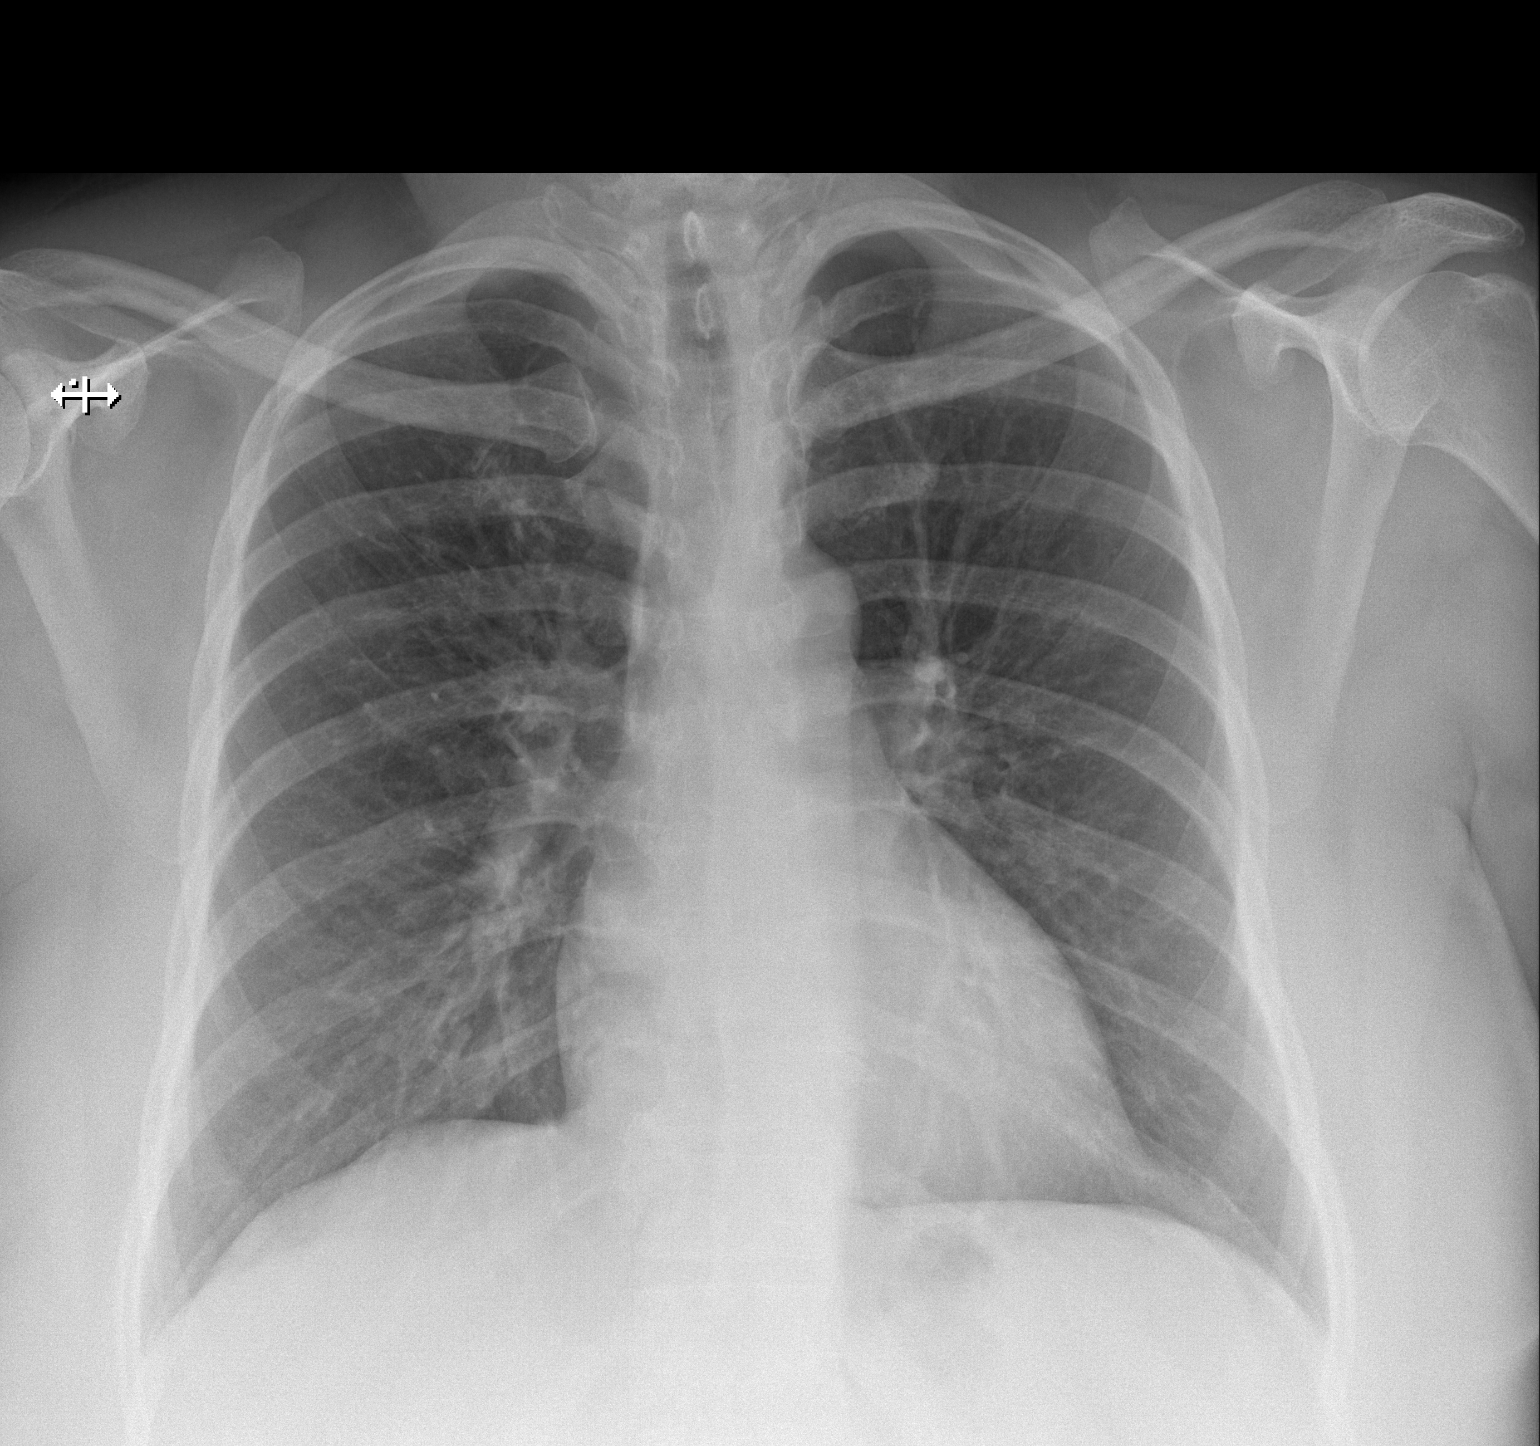

[w chest lat]
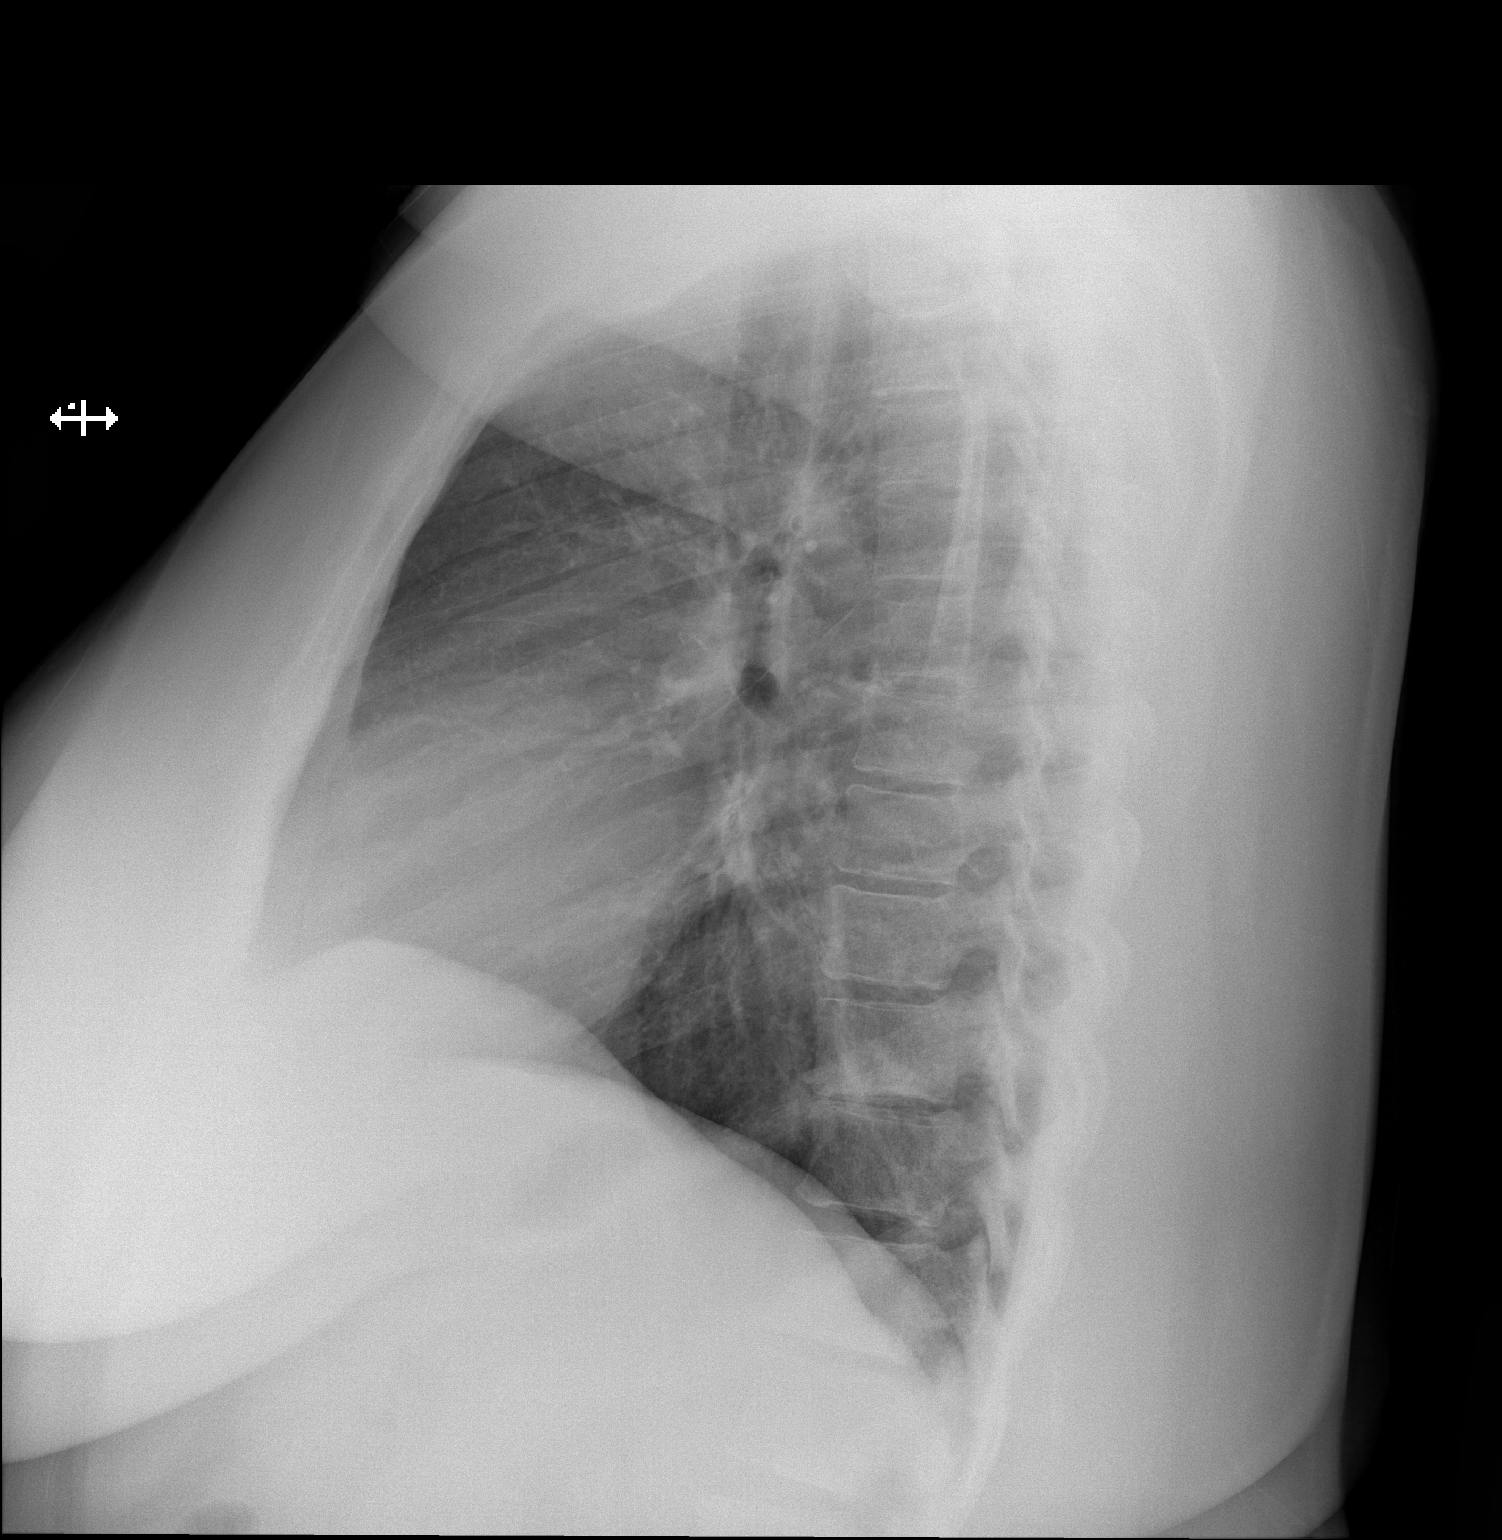

[2 of 2 positions shown; findings below may reference images not displayed]

FINDINGS: Cardiac silhouette upper normal in size, unchanged. Hilar and
mediastinal contours unremarkable. Lungs clear. Bronchovascular
markings normal. Pulmonary vascularity normal. No visible pleural
effusions. No pneumothorax. Mild degenerative changes involving the
lower thoracic spine.
IMPRESSION: No acute cardiopulmonary disease.

## 2016-11-10 ENCOUNTER — Other Ambulatory Visit: Payer: Self-pay | Admitting: Internal Medicine

## 2016-11-10 DIAGNOSIS — E119 Type 2 diabetes mellitus without complications: Secondary | ICD-10-CM

## 2016-11-11 MED ORDER — SITAGLIPTIN PHOS-METFORMIN HCL 50-1000 MG PO TABS: 1.0000 | ORAL_TABLET | Freq: Two times a day (BID) | ORAL | 3 refills | Status: DC

## 2016-11-11 NOTE — Telephone Encounter (Signed)
Spoke with patient, patient interested in combination pill to decrease number of pills. Changed to Janumet 1000-50 Twice daily.  Patient will call if she has trouble obtaining medication due to cost.

## 2016-11-18 ENCOUNTER — Other Ambulatory Visit: Payer: Self-pay | Admitting: Internal Medicine

## 2016-11-18 DIAGNOSIS — I10 Essential (primary) hypertension: Secondary | ICD-10-CM

## 2016-11-26 ENCOUNTER — Ambulatory Visit (INDEPENDENT_AMBULATORY_CARE_PROVIDER_SITE_OTHER): Payer: Self-pay | Admitting: Internal Medicine

## 2016-11-26 ENCOUNTER — Encounter: Payer: Self-pay | Admitting: Internal Medicine

## 2016-11-26 VITALS — BP 150/90 | HR 77 | Temp 98.0°F | Wt 255.8 lb

## 2016-11-26 DIAGNOSIS — L988 Other specified disorders of the skin and subcutaneous tissue: Secondary | ICD-10-CM

## 2016-11-26 DIAGNOSIS — Z87891 Personal history of nicotine dependence: Secondary | ICD-10-CM

## 2016-11-26 DIAGNOSIS — Z794 Long term (current) use of insulin: Secondary | ICD-10-CM

## 2016-11-26 DIAGNOSIS — Z23 Encounter for immunization: Secondary | ICD-10-CM

## 2016-11-26 DIAGNOSIS — L723 Sebaceous cyst: Secondary | ICD-10-CM | POA: Insufficient documentation

## 2016-11-26 DIAGNOSIS — E119 Type 2 diabetes mellitus without complications: Secondary | ICD-10-CM

## 2016-11-26 DIAGNOSIS — I10 Essential (primary) hypertension: Secondary | ICD-10-CM

## 2016-11-26 DIAGNOSIS — Z79899 Other long term (current) drug therapy: Secondary | ICD-10-CM

## 2016-11-26 LAB — POCT GLYCOSYLATED HEMOGLOBIN (HGB A1C): Hemoglobin A1C: 8.3

## 2016-11-26 LAB — GLUCOSE, CAPILLARY: Glucose-Capillary: 142 mg/dL — ABNORMAL HIGH (ref 65–99)

## 2016-11-26 NOTE — Progress Notes (Signed)
I saw and evaluated the patient. I personally confirmed the key portions of Dr. Nelma Rothman history and exam and reviewed pertinent patient test results. The assessment, diagnosis, and plan were formulated together and I agree with the documentation in the resident's note.

## 2016-11-26 NOTE — Assessment & Plan Note (Addendum)
Assessment: Patient was evaluated today for her chronic hypertension. She is currently taking amlodipine, losartan, HCTZ, carvedilol and clonidine. Her blood pressure was 150/90 prior to the exam. Repeat measurement following exam was recorded at 152/90. Patient states that she typically experiences hypertension visiting physician, and presents her blood pressure readings from home indicating pressures averaging 135/85. Her home blood pressure medication recordings range from 102/79-155/99.   Plan: Will continue current medication regimen at this time given the patients average home pressures of 135/85. Patient states that she has been dieting, and exercising significantly more of recent and hopes this will assist with decreasing her blood pressure. Patient advised that clonidine is less than an ideal antihypertensive and we would eventually like to decrease this medication and initiate spironolactone instead. However, we will make this change slowly over time as to prevent severe rebound hypertension secondary to the clonidine withdraw. Patient is in agreement with the current plan.

## 2016-11-26 NOTE — Progress Notes (Signed)
CC: "Routine visit"  HPI:  [name] is a 56 y.o. female who presented for a routine visit regarding treatment of her hypertension and type 2 diabetes. She states that she has been closely monitoring both her blood pressure and blood glucose levels. Patient brings with her today recordings of her last 20 blood pressure readings. In addition she states that she has not had symptomatic hypoglycemic episodes since her last medication adjustment. She is awaiting the arrival via mail of her combo sitagliptin and metformin tablet while she currently takes individual tablets at the equivalent dose. Patient is aware that she should discard the old tablets once the combination tablets arrive.   In addition, the patient states that she has a newly observed "knot" on the posterior aspect of her left lower calf. This location is nontender, not red, and does not produce to discharge. In conjunction, the patient states that she has a additional soft, nontender mass on the internal aspect of her left good. Please see assessment and plan for additional information.  Past Medical History:  Diagnosis Date  . DIABETES MELLITUS, TYPE II 06/02/2006   Last HBA1C 8.7.   . HYPERLIPIDEMIA 06/02/2006   Qualifier: Diagnosis of  By: Patrice Paradise MD, Roderic Palau    . HYPERTENSION 06/02/2006   Filed Vitals:   01/21/11 1134  BP: 162/98  Pulse: 80  Temp: 97.4 F (36.3 C)  TempSrc: Oral  Resp: 20  Height: 5\' 8"  (1.727 m)  Weight: 261 lb (118.389 kg)      Review of Systems:  Review of Systems  Constitutional: Negative for chills, diaphoresis and fever.  HENT: Negative for ear pain and sinus pain.   Eyes: Negative for blurred vision, photophobia and redness.  Respiratory: Negative for cough and shortness of breath.   Cardiovascular: Negative for chest pain and leg swelling.  Gastrointestinal: Negative for constipation, diarrhea, nausea and vomiting.  Genitourinary: Positive for frequency. Negative for flank pain and urgency.   Musculoskeletal: Negative for myalgias.  Neurological: Negative for dizziness and headaches.  Endo/Heme/Allergies: Positive for polydipsia (Possibly a slight increase over baseline).  Psychiatric/Behavioral: The patient is not nervous/anxious.     Physical Exam:  Vitals:   11/26/16 1435  BP: (!) 150/90  Pulse: 77  Temp: 98 F (36.7 C)  TempSrc: Oral  SpO2: 100%  Weight: 255 lb 12.8 oz (116 kg)   Physical Exam  Constitutional: She is oriented to person, place, and time. She appears well-developed and well-nourished. No distress.  HENT:  Head: Normocephalic and atraumatic.  Eyes: Conjunctivae and EOM are normal.  Neck: Normal range of motion. Neck supple.  Cardiovascular: Normal rate and regular rhythm.   No murmur heard. Pulmonary/Chest: Effort normal and breath sounds normal. No stridor. No respiratory distress.  Abdominal: Soft. Bowel sounds are normal. She exhibits no distension. There is no tenderness.  Musculoskeletal: She exhibits no edema or tenderness.  Neurological: She is alert and oriented to person, place, and time.  Skin: Skin is warm. Capillary refill takes less than 2 seconds. No rash noted. She is not diaphoretic.     Patient has a small 2-3cm sebacious cyst on the distal posterior aspect of her left distal extremity as indicated on he image below. This a hard, mobile, non-erythematous, non-edematous lesion not visible on the skin.   In addition, she has a 1-2cm, probable lipoma, on the interior aspect of her left glut approximately 15cm above the anus, not consistent with a fistula or abscess. This is a non-tender, non-fluctuating,  non-erythematous lesion.   Psychiatric: She has a normal mood and affect.    Assessment & Plan:   See Encounters Tab for problem based charting.  Patient seen with Dr. Eppie Gibson

## 2016-11-26 NOTE — Patient Instructions (Addendum)
Please continue to take your medications as prescribed.  In addition his continue diet and exercise as you indicated you have been. In addition noted likely need to attempt to maintain an exercise program consisting of at least 30 minutes of walking 3 times weekly.  Please discontinue her Januvia and metformin when you're new combo medication arrives.  Please continue to monitor her blood sugar and blood pressure at home and should have been. You have been doing a fantastic job of this thus far.  As we discussed we do not plan to alter any of the medications at this time. He will continue to monitor her progress and make any changes in the future that seemed to be necessary with your approval.  Thank you for visiting our clinic today.

## 2016-11-26 NOTE — Assessment & Plan Note (Addendum)
Assessment: Patient states that she has been compliant with her most recent medication regimen. She is currently taking 34 units of novolin 70/30 in the morning and 15 units in the evening. She denies symptomatic hypoglycemic episodes, but states that she has had hypoglycemic fingerstick recordings since then. The number of such recordings has decreased following the last adjustment. Patient stated that her cyclic and metformin combination pill has been ordered and when it arrives she will discontinue her Januvia and current metformin tablet. She will take the 50/1000 mg combo pill in place of the current regimen. This is a lateral move.   Plan: Will keep patient on his current medication regimen at this time. His A1c increased to 8.1, however given the many medication changes recently, lack of insurance, I believe this medication regimen is currently the best. We will continue to closely monitor and evaluate her blood glucose levels moving forward. If indicated, we will attempt to alter her insulin regimen to better control her blood glucose. However, the patient states that she is happy with her current medication regimen and would like to continue it at this time.  Although the patients HgbA1c goal is 7.0, we are attempting to avoid hypoglycemia. Patient advised that we will continue to adjust her medications to achieve optimal glycemic ranges moving forward.

## 2016-11-26 NOTE — Assessment & Plan Note (Signed)
Assessment: Patient presents with 2-3 centimeter, hard, nonindurated, non-mobile, nontender, nonerythematous, subcutaneous mass distal to the posterior aspect of her left calf. Patient states that this mass does not concern her, is nontender, as well as observe incidentally while placing her socks.  Plan: We will continue to monitor this on a regular basis. Patient informed that if at any time the cyst enlarges, becomes painful, or concerning in any other way, she may advise [place] for further evaluation.

## 2016-11-26 NOTE — Addendum Note (Signed)
Addended by: Oval Linsey D on: 11/26/2016 05:19 PM   Modules accepted: Level of Service

## 2016-12-02 ENCOUNTER — Other Ambulatory Visit: Payer: Self-pay

## 2016-12-02 NOTE — Telephone Encounter (Signed)
hydrochlorothiazide (HYDRODIURIL) 25 MG tablet, refill request @  [facility].

## 2016-12-03 MED ORDER — HYDROCHLOROTHIAZIDE 25 MG PO TABS: 12.5000 mg | ORAL_TABLET | Freq: Every day | ORAL | 3 refills | Status: DC

## 2016-12-05 NOTE — Telephone Encounter (Signed)
Called in to [facility] per pt request. Notified patient.

## 2017-01-22 ENCOUNTER — Ambulatory Visit: Payer: Self-pay

## 2017-02-26 ENCOUNTER — Other Ambulatory Visit: Payer: Self-pay | Admitting: Internal Medicine

## 2017-02-26 ENCOUNTER — Other Ambulatory Visit: Payer: Self-pay | Admitting: *Deleted

## 2017-02-26 DIAGNOSIS — E119 Type 2 diabetes mellitus without complications: Secondary | ICD-10-CM

## 2017-02-26 DIAGNOSIS — I10 Essential (primary) hypertension: Secondary | ICD-10-CM

## 2017-02-26 MED ORDER — SITAGLIPTIN PHOS-METFORMIN HCL 50-1000 MG PO TABS: 1.0000 | ORAL_TABLET | Freq: Two times a day (BID) | ORAL | 0 refills | Status: DC

## 2017-02-26 NOTE — Telephone Encounter (Signed)
Pt requesting rx sent to [facility].

## 2017-02-26 NOTE — Telephone Encounter (Signed)
Next appt scheduled 12/19 with PCP.  

## 2017-02-27 ENCOUNTER — Other Ambulatory Visit: Payer: Self-pay | Admitting: Internal Medicine

## 2017-02-27 NOTE — Telephone Encounter (Signed)
Called, no answer, lm for rtc 

## 2017-02-27 NOTE — Telephone Encounter (Signed)
[facility], pls call back

## 2017-03-02 NOTE — Telephone Encounter (Signed)
Called [facility], no answer, lm for [facility] tech cathy to rtc

## 2017-03-04 ENCOUNTER — Ambulatory Visit (INDEPENDENT_AMBULATORY_CARE_PROVIDER_SITE_OTHER): Payer: Self-pay | Admitting: Internal Medicine

## 2017-03-04 ENCOUNTER — Encounter (INDEPENDENT_AMBULATORY_CARE_PROVIDER_SITE_OTHER): Payer: Self-pay

## 2017-03-04 VITALS — BP 148/84 | HR 65 | Temp 97.8°F | Wt 255.9 lb

## 2017-03-04 DIAGNOSIS — K439 Ventral hernia without obstruction or gangrene: Secondary | ICD-10-CM

## 2017-03-04 DIAGNOSIS — Z79899 Other long term (current) drug therapy: Secondary | ICD-10-CM

## 2017-03-04 DIAGNOSIS — E785 Hyperlipidemia, unspecified: Secondary | ICD-10-CM

## 2017-03-04 DIAGNOSIS — I10 Essential (primary) hypertension: Secondary | ICD-10-CM

## 2017-03-04 DIAGNOSIS — E119 Type 2 diabetes mellitus without complications: Secondary | ICD-10-CM

## 2017-03-04 DIAGNOSIS — Z794 Long term (current) use of insulin: Secondary | ICD-10-CM

## 2017-03-04 DIAGNOSIS — K5900 Constipation, unspecified: Secondary | ICD-10-CM

## 2017-03-04 DIAGNOSIS — Z87891 Personal history of nicotine dependence: Secondary | ICD-10-CM

## 2017-03-04 DIAGNOSIS — R112 Nausea with vomiting, unspecified: Secondary | ICD-10-CM

## 2017-03-04 LAB — GLUCOSE, CAPILLARY: Glucose-Capillary: 224 mg/dL — ABNORMAL HIGH (ref 65–99)

## 2017-03-04 LAB — POCT GLYCOSYLATED HEMOGLOBIN (HGB A1C): Hemoglobin A1C: 9.1

## 2017-03-04 MED ORDER — PANTOPRAZOLE SODIUM 40 MG PO TBEC: 40.0000 mg | DELAYED_RELEASE_TABLET | Freq: Every day | ORAL | 1 refills | Status: DC

## 2017-03-04 NOTE — Patient Instructions (Addendum)
FOLLOW-UP INSTRUCTIONS When: 3 months For: routine follow-up What to bring: All of your medications, glucometer and blood pressure machine.  Thank you for your visit to the [facility] today.   We will schedule you with an appointment for Ophthalmology, and with [name] for a discussion with changing your insulin regimen as we discussed.   Please call back tomorrow or the following day if you need any refills.

## 2017-03-04 NOTE — Progress Notes (Signed)
l °

## 2017-03-04 NOTE — Telephone Encounter (Signed)
Spoke w/ cathy at pharm, pt will need to see them for PA

## 2017-03-04 NOTE — Progress Notes (Signed)
CC: routine evaluation for HTN, DMII  HPI: Ashley Long is a 56 y.o. who presents to the clinic for routine evaluation of her chronic medical conditions.  Patient denies acute problems stating that she has been monitoring her blood glucose, and blood pressure regularly.  She is brought with her last 4 blood pressure readings from her home machine which are well within the normal limits approximately 120/80.  Machine was brought with her today and displays a 153/100 which is fairly consistent with the clinics device.  As such I do not believe her machine to be an accurate but instead I believe she is experiencing whitecoat hypertension.  However, we will consider an adjustment to her medication regimen if her pressures continue to increase if she is amenable.  The patient denied chest pain, abdominal pain, headache, vomiting, diarrhea, or dysuria.  She attested to mild constipation and would like a refill of MiraLAX powder depressible.  HTN: Assessment: Moderately well controlled. Will continue to assess and plan to make adjustments at her next visit. She again is hesitant when asked to change her regimen at this time. We will give her one more visit to think it over, but if her pressures remain high I will insist she reconsider a change.   Plan: Continue diet and exercise Continue amlodipine 10mg  daily Continue carvedilol 25mg  daily Continue HCTZ 25mg  daily Continue Losartan 100mg  daily   Hyperlipidemia: Previous assessment completed 3 years prior.  Plan: Lipid Panel Results Total 118, tri 105, HDL 46, LDL 51.    Diabetes Mellitus: Assessment: Last HgbA1c 8.3 Patient has been watching what she eats but assessment of her average blood glucose levels as per her glucometer show wildly ranging levels. It is concerning that she often experiences hypoglycemia in the morning as well as multiple blood glucose readings in the 400s.  Patient is off target with regard to her goal CBG  recordings with her ever-increasing Y0V I am not certain that the current regimen of 70/30 insulin is adequate to treat her hyperglycemia while avoiding hypoglycemia.  We evaluated her abdomen for possible injection error but failed to note any significant areas of induration, hematomas or lipoma or other areas of concern.   Plan: HgbA1c today is 9.1 Have patient meet with [name] and discuss basal bolus vs current insulin Consider switch from 70/30 to basal bolus Refilled Insulin as needed Continue JANUMET 50-1000 mg BID  Placed in ambulatory referral to ophthalmology, however, this appears to be impossible given her lack of insurance.  I am hoping that the clinic is able to establish this appointment as the patient is in need of an eye exam. Refilled pantoprazole for GERD  Past Medical History:  Diagnosis Date  . DIABETES MELLITUS, TYPE II 06/02/2006   Last HBA1C 8.7.   . HYPERLIPIDEMIA 06/02/2006   Qualifier: Diagnosis of  By: Patrice Paradise MD, Roderic Palau    . HYPERTENSION 06/02/2006   Filed Vitals:   01/21/11 1134  BP: 162/98  Pulse: 80  Temp: 97.4 F (36.3 C)  TempSrc: Oral  Resp: 20  Height: 5\' 8"  (1.727 m)  Weight: 261 lb (118.389 kg)      Review of Systems:  ROS negative except as per HPI.  Physical Exam:  Vitals:   03/04/17 1410  BP: (!) 148/84  Pulse: 65  Temp: 97.8 F (36.6 C)  TempSrc: Oral  SpO2: 99%  Weight: 255 lb 14.4 oz (116.1 kg)   Physical Exam  Constitutional: She appears well-developed and well-nourished. No distress.  Cardiovascular: Normal rate and regular rhythm.  No murmur heard. Pulmonary/Chest: Effort normal and breath sounds normal. No respiratory distress.  Abdominal: Soft. Bowel sounds are normal. She exhibits no distension. A hernia (Ventral hernia.) is present.  Musculoskeletal: She exhibits no edema or tenderness.  Skin: Skin is warm. No rash noted. No erythema.  Vitals reviewed.   Assessment & Plan:   See Encounters Tab for problem based  charting.  Patient seen with Dr. Angelia Mould

## 2017-03-05 ENCOUNTER — Telehealth: Payer: Self-pay | Admitting: *Deleted

## 2017-03-05 LAB — LIPID PANEL
CHOLESTEROL TOTAL: 118 mg/dL (ref 100–199)
Chol/HDL Ratio: 2.6 ratio (ref 0.0–4.4)
HDL: 46 mg/dL (ref >39)
LDL Calculated: 51 mg/dL (ref 0–99)
TRIGLYCERIDES: 105 mg/dL (ref 0–149)
VLDL Cholesterol Cal: 21 mg/dL (ref 5–40)

## 2017-03-05 MED ORDER — POLYETHYLENE GLYCOL 3350 17 GM/SCOOP PO POWD: 17.0000 g | Freq: Every day | ORAL | 5 refills | Status: DC

## 2017-03-05 NOTE — Telephone Encounter (Signed)
LEFT VOICE MESSAGE FOR PATIENT TO CALL [facility] REGARDING HER EYE REFERRAL / PATIENT NEEDS TO SEE [facility] TO RENEW CARD.  LET PATIENT KNOW I CAN MAKE OUTSIDE REFERRAL APPOINTMENT FOR HER , BUT WILL HAVE TO PAY OUT POCKET FOR THE SERVICE IS ABLE TO PAY. WAITING CALL BACK

## 2017-03-05 NOTE — Assessment & Plan Note (Addendum)
Diabetes Mellitus: Assessment: Last HgbA1c 8.3 Patient has been watching what she eats but assessment of her average blood glucose levels as per her glucometer show wildly ranging levels. It is concerning that she often experiences hypoglycemia in the morning as well as multiple blood glucose readings in the 400s.  Patient is off target with regard to her goal CBG recordings with her ever-increasing W8Q I am not certain that the current regimen of 70/30 insulin is adequate to treat her hyperglycemia while avoiding hypoglycemia.  We evaluated her abdomen for possible injection error but failed to note any significant areas of induration, hematomas or lipoma or other areas of concern.   Plan: HgbA1c today is 9.1 Have patient meet with [name] and discuss basal bolus vs current insulin Consider switch from 70/30 to basal bolus Refilled Insulin as needed Continue JANUMET 50-1000 mg BID

## 2017-03-05 NOTE — Assessment & Plan Note (Addendum)
HTN: Assessment: Moderately well controlled. Will continue to assess and plan to make adjustments at her next visit. She again is hesitant when asked to change her regimen at this time. We will give her one more visit to think it over, but if her pressures remain high I will insist she reconsider a change.   Plan: Continue diet and exercise Continue amlodipine 10mg  daily Continue carvedilol 25mg  daily Continue HCTZ 25mg  daily Continue Losartan 100mg  daily

## 2017-03-05 NOTE — Assessment & Plan Note (Signed)
  Hyperlipidemia: Previous assessment completed 3 years prior.  Plan: Lipid Panel Results Total 118, tri 105, HDL 46, LDL 51.

## 2017-03-05 NOTE — Telephone Encounter (Signed)
Patient is calling back, pls call patient back

## 2017-03-06 ENCOUNTER — Encounter: Payer: Self-pay | Admitting: *Deleted

## 2017-03-06 ENCOUNTER — Other Ambulatory Visit: Payer: Self-pay | Admitting: Internal Medicine

## 2017-03-06 DIAGNOSIS — E119 Type 2 diabetes mellitus without complications: Secondary | ICD-10-CM

## 2017-03-06 DIAGNOSIS — I10 Essential (primary) hypertension: Secondary | ICD-10-CM

## 2017-03-06 MED ORDER — SITAGLIPTIN PHOS-METFORMIN HCL 50-1000 MG PO TABS: 1.0000 | ORAL_TABLET | Freq: Two times a day (BID) | ORAL | 0 refills | Status: DC

## 2017-03-06 MED ORDER — AMLODIPINE BESYLATE 10 MG PO TABS: 10.0000 mg | ORAL_TABLET | Freq: Every day | ORAL | 0 refills | Status: DC

## 2017-03-06 NOTE — Telephone Encounter (Signed)
Patient is requesting sitaGLIPtin-metformin, amlodipine 10mg   to [place] [facility]. To get her by until her medicine is mail to her.

## 2017-03-06 NOTE — Progress Notes (Signed)
Internal Medicine Clinic Attending  I saw and evaluated the patient.  I personally confirmed the key portions of the history and exam documented by Dr. Harbrecht and I reviewed pertinent patient test results.  The assessment, diagnosis, and plan were formulated together and I agree with the documentation in the resident's note.  

## 2017-03-19 ENCOUNTER — Telehealth: Payer: Self-pay | Admitting: Dietician

## 2017-03-19 DIAGNOSIS — E119 Type 2 diabetes mellitus without complications: Secondary | ICD-10-CM

## 2017-03-20 NOTE — Telephone Encounter (Signed)
Ashley Long scheduled an appointment to discuss her basal bolus insulin regimen/carb counting for Wednesday 03/25/2017. Request referral in the note be signed.

## 2017-03-25 ENCOUNTER — Telehealth: Payer: Self-pay | Admitting: *Deleted

## 2017-03-25 ENCOUNTER — Ambulatory Visit (INDEPENDENT_AMBULATORY_CARE_PROVIDER_SITE_OTHER): Payer: Self-pay | Admitting: Dietician

## 2017-03-25 DIAGNOSIS — Z794 Long term (current) use of insulin: Secondary | ICD-10-CM

## 2017-03-25 DIAGNOSIS — Z713 Dietary counseling and surveillance: Secondary | ICD-10-CM

## 2017-03-25 DIAGNOSIS — E119 Type 2 diabetes mellitus without complications: Secondary | ICD-10-CM

## 2017-03-25 DIAGNOSIS — E11649 Type 2 diabetes mellitus with hypoglycemia without coma: Secondary | ICD-10-CM

## 2017-03-25 NOTE — Telephone Encounter (Signed)
CALLED PATIENT LEFT VOICE MESSAGE REGARDING EYE REFERRAL. EYE REFERRAL HAS BEEN FAXED TO [facility] FOR REVIEW / [place]. [facility] OFFICE WILL CALL PATIENT WITH APPOINTMENT.

## 2017-03-25 NOTE — Progress Notes (Addendum)
  Medical Nutrition Therapy:  Appt start time: 1430 end time:  1600. Visit # 1  Assessment:  Primary concerns today: change insulin regimen to basal bolus regimen Referred from Dr. Berline Lopes for transition to carb consistent meal plan and basal bolus insulin regimen. Spoke with Dr. Dareen Piano about order and insulin samples because patient gets her insulin from [place] mail order that takes 7-14 days to get to her.  Has been taking 34 units Humulin 70/30 in am and 15 units in PM for total daily dose of 49 units.  Recommend to start: 25 units lantus daily- patient desires qam  Novolog- mealtime calculation 450/49=1 units for each 9-10 gram carb.  Ashley Long to eat ~ 30 grams carb for meals.  Carb consistency education done today.   Preferred Learning Style:No preference indicated  Learning Readiness: Ready  ANTHROPOMETRICS:deferred today WEIGHT HISTORY: Highest: 250- SLEEP:need to assess at future visit MEDICATIONS: TDD 49 units/day, ICR-1unit:9-10 carbs, correction: 1:37-40 mg/dl BLOOD SUGAR:cbg 55 on her meter at presentation, 108 after treatment with 20 grams fast acting carbohydrate, meter download: average 219, 8% below target, 13% in target and 80% above target., breakfast average 221, lunch 227, dinner 204, lows have been occuring throughout the day but mostly after dinner DIETARY INTAKE: Usual eating pattern includes 3 meals and 0-2 snacks per day. Everyday foods include bread, potatoes, [place] and chicken.  Avoided foods include many vegetables, fruit.  Food Intolerances: need to assess at future visit Dining Out (times/week): packs food to take to work 24-hr recall:  B ( 8 AM): ~ 1 cup raisin bran, honey nut cheerios or raisin bran cereal & ~ 1/2 cup milk, or 2 [place] sausage patties & 1 slice bread L ( 12 PM): 1/2- whole [place] sandwich D ( 8 PM): ~ 1 cup mashed potatoes & baked chicken Beverages: water  Usual physical activity: work as Scientist, water quality and ADLs  Progress  Towards Goal(s):  In progress.   Nutritional Diagnosis:  [place]-2.3 Food-medication interaction As related to insulin and blood sugar mismatch.  As evidenced by high A1C, hypoglcyemia on meter download and in office today.    Intervention:  Nutrition education: carb counting, using an insulin pen, basal bolus insulin therapy, insulin action,  Coordination of care: Ashley Long needs prescription for Basaglar and Humalog sent to [facility]  Teaching Method Utilized: Visual, Auditory, Hands on Handouts given during visit include: AVS, insulin pen needles, how to use insulin pens, schedule for insulin injections, ready, set start carb counting Barriers to learning/adherence to lifestyle change: competing values Demonstrated degree of understanding via:  Teach Back   Monitoring/Evaluation:  Dietary intake, exercise, meter & logbook, and body weight in 1 week(s). Ashley Long, Ashley Long, Ashley Long 03/25/2017 4:41 PM.

## 2017-03-25 NOTE — Patient Instructions (Addendum)
Do Not take any more of your old insulin- Humulin 70/30   Today We talked about: 1- Memorizing what food groups contain carbohydrates 2- keeping your carbohydrate intake consistent at meals each day (starches, fruits, milk and yogurt, sweets, starchy vegetables 3- we agreed you'd try to keep your carb intake to about 30 grams carb at each meal  For example: Breakfast #1- 1 cup honey nut cheerios(25 grams) & 1/2 cup milk(6 grams)= 30 grams carb Breakfast #2- 1-2 turk sausage patties & 2 slice bread( 30 grams carb)   Lunch- whole sandwich- 2 slices bread- 30 gram carb  Dinner- 1 cup starchy vegetables(30 grams) and a lowfat protein   Insulin transition: Inject 3 units Novolog 10-20 minutes before dinner tonight in your abdomen  New Daily routine:  Inject lantus 25 units in buttocks, back of arm or thigh  Inject Novolog 3 units 10-20 minutes before breakfast Thursday morning Inject Novolog 3 units 10-20 minutes before lunch Thursday afternoon Inject Novolog 3 units 10-20 minutes before dinner Thursday evening  Repeat this every day.  Keep checking blood sugars  Before meals as you have been doing and record   Remember that the only time you DO NOT WAIT 10-20 minutes between taking your meal time insulin and eating is if your sugar is below 80-90 take insulin and eat right away.   Remember to carry pen needles and NOVOLOG with you and your meter to work or away from home with you.Marland Kitchen

## 2017-03-26 ENCOUNTER — Other Ambulatory Visit: Payer: Self-pay | Admitting: Pharmacist

## 2017-03-26 DIAGNOSIS — E119 Type 2 diabetes mellitus without complications: Secondary | ICD-10-CM

## 2017-03-26 MED ORDER — INSULIN LISPRO 100 UNIT/ML (KWIKPEN): 3.0000 [IU] | PEN_INJECTOR | Freq: Three times a day (TID) | SUBCUTANEOUS | 11 refills | Status: DC

## 2017-03-26 MED ORDER — PEN NEEDLES 31G X 5 MM MISC: 4.0000 | Freq: Every day | 11 refills | Status: DC

## 2017-03-26 MED ORDER — BASAGLAR KWIKPEN 100 UNIT/ML ~~LOC~~ SOPN: 25.0000 [IU] | PEN_INJECTOR | Freq: Every day | SUBCUTANEOUS | 3 refills | Status: DC

## 2017-03-26 NOTE — Progress Notes (Signed)
Adjusted quantity on Basaglar from 3 mL to 15 mL due to package size, also sent prescription for pen needles.

## 2017-03-26 NOTE — Addendum Note (Signed)
Addended by: Aldine Contes on: 03/26/2017 02:26 PM   Modules accepted: Orders

## 2017-04-01 ENCOUNTER — Ambulatory Visit (INDEPENDENT_AMBULATORY_CARE_PROVIDER_SITE_OTHER): Payer: Self-pay | Admitting: Dietician

## 2017-04-01 DIAGNOSIS — Z713 Dietary counseling and surveillance: Secondary | ICD-10-CM

## 2017-04-01 DIAGNOSIS — E119 Type 2 diabetes mellitus without complications: Secondary | ICD-10-CM

## 2017-04-01 DIAGNOSIS — Z6839 Body mass index (BMI) 39.0-39.9, adult: Secondary | ICD-10-CM

## 2017-04-01 DIAGNOSIS — Z794 Long term (current) use of insulin: Secondary | ICD-10-CM

## 2017-04-01 NOTE — Progress Notes (Signed)
  Medical Nutrition Therapy:  Appt start time: 1440 end time:  3762. Visit # 2  Assessment:  Primary concerns today: support for new to basal bolus regimen Ms hathaway is happy with her new insulin regemin but is getting bored with some meals. She could not name the carb containing food groups without assistance and still needs assist with pkanning meals of ~ 30 grams carb. She has been taking: 25 units lantus daily and 3 units Novolog three times a day before meals. Her blood sugar are mostly 200-300 fasting, 150-250 lunch and dinner. She was injecting lantus in butt  and Novolog in arm and stomach.  She is not checking before bedtime because she is still delaying her dinner like she did with her old insulin.  She is considering moving it earlier now. This should help with less midday snacking Goals for the switch to basal bolus therapy discussed with patient are: improved blood sugars, less frequent hypoglycemia and more flexibility. She has achieved the second two and is working in the first goal.  Ms. raena pau to eat ~ 30 grams carb for meals. She is thinking about incorporating more fruits and vegetables.   Learning Readiness: Ready and making progress Activity- moves more after breakfast and lunch ANTHROPOMETRICS:261.1#- stable SLEEP:need to assess at future visit MEDICATIONS: TDD 34 units/day, increasing to 39 units/day today, ICR 1:9, correction factor ~ 36 mg/dl BLOOD SUGAR:see above, reviewed log book DIETARY INTAKE: 24-hr recall:  B ( 8 AM): ~ 1 cup honey nut cheerios & ~ 1/2 cup milk, or 2 [place] sausage patties & 1 slice biscuit L ( 12 PM): 1/2- whole [place] sandwich D ( 8 PM): ~ homemade [place] sub Beverages: water  Usual physical activity: work as Scientist, water quality and ADLs  Progress Towards Goal(s):  In progress.   Nutritional Diagnosis:  [place]-2.3 Food-medication interaction As related to insulin and blood sugar mismatch.  As evidenced by high A1C, hypoglcyemia on meter download and  in office today.    Intervention:  Nutrition education: increase meal time insulin to 5 units with breakfast and dinner and 4 units with lunch. Carb consistency review and meal planning, basal bolus insulin injection sites and it's affect on insulin action,  Coordination of care: Eveny was proved with lantus pen signed out by Dr. Heber Otho, she is interested in pen needle assistance program.   Teaching Method Utilized: Visual, Auditory, Hands on Handouts given during visit include: AVS, insulin pen needles, how to use insulin pens, schedule for insulin injections, ready, set start carb counting Barriers to learning/adherence to lifestyle change: competing values Demonstrated degree of understanding via:  Teach Back   Monitoring/Evaluation:  Dietary intake, exercise, meter & logbook, and body weight in 1 week(s). [name], RD 04/01/2017 3:42 PM.

## 2017-04-01 NOTE — Patient Instructions (Signed)
What we talked about today:   Keeping carbs around 30 grams at each meal ( 25-35 grams) Reading labels for carbs- servings size and carb gram  Amount in that serving  Inject Lantus long acting insulin into thigh, back of arm or butt Inject Novolog (or Humalog) into the STOMACH  The plan:   1- check blood sugars before bed a few times over the next week sop we can see why your mrminmg sugars are elevated. 2- increase  Novolog to   5 units with breakfast,  4 units with lunch   5 units with dinner.    Call anytime with questions or concerns  [name] Diabetes Educator [phone]

## 2017-04-02 ENCOUNTER — Telehealth: Payer: Self-pay | Admitting: Dietician

## 2017-04-02 NOTE — Telephone Encounter (Signed)
Ms. Lichty calls saying that her sugar was  186 before dinner last night, she took 5 units Novolog in her abdomen and ate her tuna sub,  at 8:45 PM she felt funny so checked her sugar and it was  65.She treated it and rechecked and it was  186, at midnight it was  178 at 530 AM today it was  181, at lunch after taking 5 units with breakfast her sugar was 113. "I'm getting there" . We discussed decreasing her dinner dose of rapid acting insulin to 4 units tonight. She verbalized understanding saying she is full with 30 grams carb at meals.Marland Kitchen

## 2017-04-06 NOTE — Addendum Note (Signed)
Addended by: Hulan Fray on: 04/06/2017 02:29 PM   Modules accepted: Orders

## 2017-04-08 ENCOUNTER — Encounter: Payer: Self-pay | Admitting: Dietician

## 2017-04-08 ENCOUNTER — Ambulatory Visit (INDEPENDENT_AMBULATORY_CARE_PROVIDER_SITE_OTHER): Payer: Self-pay | Admitting: Dietician

## 2017-04-08 DIAGNOSIS — Z713 Dietary counseling and surveillance: Secondary | ICD-10-CM

## 2017-04-08 DIAGNOSIS — E119 Type 2 diabetes mellitus without complications: Secondary | ICD-10-CM

## 2017-04-08 DIAGNOSIS — Z794 Long term (current) use of insulin: Secondary | ICD-10-CM

## 2017-04-08 NOTE — Patient Instructions (Addendum)
To transition to taking your lantus at dinner time so the following:  Thursday morning take 12 units lantus Thursday dinner take 13 units lantus Friday morning no lantus Friday dinner take 25 units lantus  No change in mealtime insulin until we talk and go over your patterns of blood sugars.

## 2017-04-08 NOTE — Progress Notes (Addendum)
  Medical Nutrition Therapy:  Appt start time: 3762 end time:  8315. Visit # 3  Assessment:  Primary concerns today: support for new to basal bolus regimen Ashley Long is doing well on her new insulin regimen; "I am getting the hook of it" . She is taking: 25 units lantus daily and 5, 4, 4 units of Novolog three times a day before meals. Her blood sugars are improved mostly 100s now with 6/21 >200mg /dl and her fastings are usually higher than 140.  Goals for the switch to basal bolus therapy are: improved blood sugars, less frequent hypoglycemia and more flexibility. She has achieved the second two and is working in the first goal.  Ashley Long has been eating ~ 30 grams carb for meals and only occasional snacks. She is incorporating more fruits and vegetables.   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 ANTHROPOMETRICS:260.6#- stable SLEEP:need to assess at future visit MEDICATIONS: TDD 38, ICR 1:13, correction factor ~ 47 mg/dl, 66% basal, 34% bolus BLOOD SUGAR: meter did not keep date and time and she forgot her log book  patient called back with CBGs from her book Date  Breakfast Lunch  Dinner  Bedtime  1/23 152/287 72  214/192 1/22 173  250  142 1/21 193  132  206 1/20 155  146  227 1/19 166  117  100  104  1/18 181  113  104 1/17 273  212  186  Usual physical activity: work as Scientist, water quality and ADLs  Progress Towards Goal(s):  In progress.   Nutritional Diagnosis:  [place]-2.3 Food-medication interaction As related to insulin and blood sugar mismatch.  As evidenced by high A1C, hypoglcyemia on meter download and in office today.    Intervention:  Nutrition education: how to interpret patterns in blood sugar readings and how to use that to adjust insulin, diet, activity Coordination of care: called pharmacy- her insulin was delivered today.   Teaching Method Utilized: Visual, Auditory, Hands on Handouts given during visit include: AVS,  insulin pen needles, how to use insulin pens, schedule for insulin injections, ready, set start carb counting Barriers to learning/adherence to lifestyle change: competing values Demonstrated degree of understanding via:  Teach Back   Monitoring/Evaluation:  Dietary intake, exercise, meter & logbook, and body weight in 1 week(s). [name], RD 04/08/2017 3:19 PM.

## 2017-04-13 ENCOUNTER — Telehealth: Payer: Self-pay | Admitting: Dietician

## 2017-04-13 NOTE — Telephone Encounter (Signed)
"  we are going to have to drop the nighttime lantus". Ashley Long calls saying that when she moved the lantus injection to night, her blood sugar dropped twice, once on Saturday am to 57 at 4 AM and on sunday before bed to 63, then increased to 256 after she ate to treat the low, then fasting the next morning was 86. She was symptomatic both times.  I gave her to choice to drop the lantus dose by 2  Or 4 units. She wanted to decrease the lantus to 21 units in the PM. I congratulated her on decreasing her am sugars and calling for assistance, suggested she may need to increase her daytime meal  Insulin coverage now. She has a follow on 04/22/17 with me.

## 2017-04-22 ENCOUNTER — Ambulatory Visit (INDEPENDENT_AMBULATORY_CARE_PROVIDER_SITE_OTHER): Payer: Self-pay | Admitting: Dietician

## 2017-04-22 DIAGNOSIS — Z794 Long term (current) use of insulin: Secondary | ICD-10-CM

## 2017-04-22 DIAGNOSIS — E119 Type 2 diabetes mellitus without complications: Secondary | ICD-10-CM

## 2017-04-22 DIAGNOSIS — Z713 Dietary counseling and surveillance: Secondary | ICD-10-CM

## 2017-04-22 NOTE — Patient Instructions (Addendum)
You are doing great with basal bolus insulin therapy!  Increase your lantus to 22 units starting tonight then call me with readings to decided if you want to increase the lantus dose more.   Have a small snack when bedtime readings <110  Try to keep a record of lunch and snacks in the afternoon for a few days so you and me can see if that is why your dinner readings are up and down.  Add correction to all three meal doses as follows: Check blood sugar before each meal. < 150 add no Humalog 151- 200 add 1 unit Humalog 201- 250 add 2 units Humalog 251- 300 add 3 units Humalog 301 - 350 add 4 units Humalog  Please record your meal time doses especially when you add correction to meal dose.   Marland Kitchen

## 2017-04-22 NOTE — Progress Notes (Signed)
  Medical Nutrition Therapy:  Appt start time: 1430 end time:  4709. Visit # 4  Assessment:  Primary concerns today: support for new to basal bolus regimen Ms Stetzel is doing well on her new insulin regimen;  She is taking: 21 units lantus daily and 5, 4, 4 units of Humalog three times a day before meals. Her blood sugars are improved, higher fasting now, more variability in pre dinner readings- suspect food variations.  She is ready and wants to start adding a correction amount of Humalog to her meal doses and gradually increase her lantus to achieve 90-150 blood sugar fasting.   Goals for the switch to basal bolus therapy are: improved blood sugars, less frequent hypoglycemia and more flexibility. She has achieved the second two and is working in the first goal.    Target established with patient today as 90-150 with target for cor3ection 100. 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   Nutritional Diagnosis:  [place]-2.3 Food-medication interaction As related to insulin and blood sugar mismatch improving  As evidenced by improved blood sugars.    Intervention:  Nutrition education: how to interpret patterns in blood sugar readings and how to use that to adjust insulin, diet, activity Coordination of care:  none   Teaching Method Utilized: Visual, Auditory, Hands on Handouts given during visit include: AVS, insulin pen needles, info on buying insulin pen needles Barriers to learning/adherence to lifestyle change: competing values Demonstrated degree of understanding via:  Teach Back   Monitoring/Evaluation:  Dietary intake, exercise, meter & logbook, and body weight in 2 week(s). [name], RD 04/22/2017 3:18 PM.

## 2017-04-27 ENCOUNTER — Telehealth: Payer: Self-pay | Admitting: Dietician

## 2017-04-27 NOTE — Telephone Encounter (Signed)
Ashley Long called to give [place] blood sugar readings since last week. She likes using the correction scale that is delineated by the number sin parenthesis below. She has had no signs or symptoms of low blood sugar.   Date  Breakfast Lunch  Dinner  Bedtime Wed 255  131  141  255   THUR 90  120  118  121  Fri 131  138  88  134 SAT 189(1u) 241(2U) 188(1U) 136 SUN 153(1U) 250(2U) 211(1U) MON 188(1U) 167(1U)  The above readings are while injecting 22 U LANTUS each evening. We discussed because most of her fasting readings are > 130, she and I agreed that she'd increase the lantus to 23 units tonight and I would call her if not okay with Dr. Berline Lopes.

## 2017-05-06 ENCOUNTER — Ambulatory Visit: Payer: Self-pay | Admitting: Dietician

## 2017-05-06 ENCOUNTER — Telehealth: Payer: Self-pay | Admitting: Dietician

## 2017-05-06 NOTE — Telephone Encounter (Signed)
Ashley Long calls with blood sugar readings. She likes this regimen and is happy with her blood sugars. She likes begin abel to correct her blood high blood sugar. The most she has taken correction has been 1-2 units. 13/34(38%) readings>150mg /dl. Encouraged her to keep up the great work!  Date  Breakfast Lunch  Dinner   Bedtime 2/11     188 167  75/150 after juice 2/12       214 240  131   2/13  147 147  80 2/14  132 171  124  2/15   90  91    81  146  2/16  164 255  181  112 2/17  167 212  125  174 2/18  140 112  124   2/19  152 162    79  144 2/20  182 145

## 2017-05-26 ENCOUNTER — Other Ambulatory Visit: Payer: Self-pay | Admitting: *Deleted

## 2017-05-26 DIAGNOSIS — I10 Essential (primary) hypertension: Secondary | ICD-10-CM

## 2017-05-28 MED ORDER — AMLODIPINE BESYLATE 10 MG PO TABS: 10.0000 mg | ORAL_TABLET | Freq: Every day | ORAL | 3 refills | Status: DC

## 2017-06-02 ENCOUNTER — Other Ambulatory Visit: Payer: Self-pay | Admitting: Internal Medicine

## 2017-06-03 ENCOUNTER — Encounter (INDEPENDENT_AMBULATORY_CARE_PROVIDER_SITE_OTHER): Payer: Self-pay

## 2017-06-03 ENCOUNTER — Other Ambulatory Visit: Payer: Self-pay

## 2017-06-03 ENCOUNTER — Encounter: Payer: Self-pay | Admitting: Internal Medicine

## 2017-06-03 ENCOUNTER — Ambulatory Visit (INDEPENDENT_AMBULATORY_CARE_PROVIDER_SITE_OTHER): Payer: Self-pay | Admitting: Internal Medicine

## 2017-06-03 VITALS — BP 168/100 | HR 77 | Temp 97.7°F | Ht 68.0 in | Wt 254.0 lb

## 2017-06-03 DIAGNOSIS — E119 Type 2 diabetes mellitus without complications: Secondary | ICD-10-CM

## 2017-06-03 DIAGNOSIS — Z7982 Long term (current) use of aspirin: Secondary | ICD-10-CM

## 2017-06-03 DIAGNOSIS — I10 Essential (primary) hypertension: Secondary | ICD-10-CM

## 2017-06-03 DIAGNOSIS — Z794 Long term (current) use of insulin: Secondary | ICD-10-CM

## 2017-06-03 DIAGNOSIS — Z79899 Other long term (current) drug therapy: Secondary | ICD-10-CM

## 2017-06-03 DIAGNOSIS — E785 Hyperlipidemia, unspecified: Secondary | ICD-10-CM

## 2017-06-03 LAB — GLUCOSE, CAPILLARY: GLUCOSE-CAPILLARY: 111 mg/dL — AB (ref 65–99)

## 2017-06-03 LAB — POCT GLYCOSYLATED HEMOGLOBIN (HGB A1C): Hemoglobin A1C: 8.2

## 2017-06-03 MED ORDER — CARVEDILOL 25 MG PO TABS: 25.0000 mg | ORAL_TABLET | Freq: Two times a day (BID) | ORAL | 3 refills | Status: DC

## 2017-06-03 MED ORDER — SITAGLIPTIN PHOS-METFORMIN HCL 50-1000 MG PO TABS: 1.0000 | ORAL_TABLET | Freq: Two times a day (BID) | ORAL | 3 refills | Status: DC

## 2017-06-03 MED ORDER — HYDROCHLOROTHIAZIDE 25 MG PO TABS: 12.5000 mg | ORAL_TABLET | Freq: Every day | ORAL | 3 refills | Status: DC

## 2017-06-03 MED ORDER — ASPIRIN 81 MG PO TBEC: 81.0000 mg | DELAYED_RELEASE_TABLET | Freq: Every day | ORAL | 3 refills | Status: DC

## 2017-06-03 MED ORDER — LOSARTAN POTASSIUM 100 MG PO TABS: 100.0000 mg | ORAL_TABLET | Freq: Every day | ORAL | 3 refills | Status: DC

## 2017-06-03 MED ORDER — HYDROCHLOROTHIAZIDE 25 MG PO TABS: 25.0000 mg | ORAL_TABLET | Freq: Every day | ORAL | 3 refills | Status: DC

## 2017-06-03 MED ORDER — ATORVASTATIN CALCIUM 40 MG PO TABS: 40.0000 mg | ORAL_TABLET | Freq: Every day | ORAL | 3 refills | Status: DC

## 2017-06-03 NOTE — Patient Instructions (Signed)
FOLLOW-UP INSTRUCTIONS When: In one month for an [place] visit to have them check your BP and a lab called a BMET Also please schedule with me for 3 months   It appear that you have made great strides with regard to your diabetes. Congratualaions, keep up the good work.   We will need to continue to work on the high blood pressure however as it is up again today.   I have filled all of your medications that appeared to need filled. Please let me know if there are any issues.

## 2017-06-03 NOTE — Progress Notes (Signed)
   CC: Routine visit for evaluation of her diabetes and blood pressure  HPI:[name] is a 57 y.o. who presents today for evaluation of her chronic medical conditions..  She denied acute complaints brings with her a copy of her daily CBG readings.  Diabetes: Patient's last hemoglobin A1c was 9.1 on 03/04/2017. Repeat hemoglobin A1c today is 8.2.  I informed the patient that this was a significant improvement we are happy with the results.  We will continue on the current insulin regimen as below: Continue insulin glargine 25 units nightly Continue insulin lispro 3-8 units 3 times daily as per sliding scale Continue Janumet 50-1000 mg twice daily Refills provided as necessary  HTN: The patient's blood pressure is 168/100.  She is currently on multiple antihypertensives and would benefit from further evaluation of her hypertension as this may be secondary hypertension.  Patient continues to insist upon diet and exercise to control her blood pressure but agrees to increase HCTZ to 25 mg daily from 12.5 mg daily.  Given her current lab parameters and evaluation there is no clear etiology for current hypertension.  We will reassess at her next appointment and discuss further evaluation of her resistant hypertension. For now recommend continuing her losartan 100 mg daily Carvedilol 25 mg twice daily Clonidine 0.3 mg daily  Refilled atorvastatin for hyperlipidemia Refilled aspirin   Past Medical History:  Diagnosis Date  . DIABETES MELLITUS, TYPE II 06/02/2006   Last HBA1C 8.7.   . HYPERLIPIDEMIA 06/02/2006   Qualifier: Diagnosis of  By: Patrice Paradise MD, Roderic Palau    . HYPERTENSION 06/02/2006   Filed Vitals:   01/21/11 1134  BP: 162/98  Pulse: 80  Temp: 97.4 F (36.3 C)  TempSrc: Oral  Resp: 20  Height: 5\' 8"  (1.727 m)  Weight: 261 lb (118.389 kg)      Review of Systems: ROS negative except as per HPI.  Physical Exam:  Vitals:   06/03/17 1513  BP: (!) 168/100  Pulse: 77  Temp: 97.7  F (36.5 C)  TempSrc: Oral  SpO2: 100%  Weight: 115.2 kg (254 lb)  Height: 5\' 8"  (1.727 m)   General: No acute distress, nondiaphoretic, HEENT: EOM intact, pupils equal round reactive to light Neuro: No focal deficits noted patient is alert and oriented x3 Cardiovascular: RRR, no murmur auscultated no rubs or gallops Pulmonary: Lungs are clear to auscultation bilaterally GI: Bowel sounds are normal, nontender, nondistended Musc: Extremities are nontender, nonedematous Psych: Normal mood and affect.  Assessment & Plan:   See Encounters Tab for problem based charting.  Patient discussed with Dr. Dareen Piano

## 2017-06-04 ENCOUNTER — Telehealth: Payer: Self-pay | Admitting: Internal Medicine

## 2017-06-04 NOTE — Assessment & Plan Note (Signed)
Diabetes: Patient's last hemoglobin A1c was 9.1 on 03/04/2017. Repeat hemoglobin A1c today is 8.2.  I informed the patient that this was a significant improvement we are happy with the results.  We will continue on the current insulin regimen as below: Continue insulin glargine 25 units nightly Continue insulin lispro 3-8 units 3 times daily as per sliding scale Continue Janumet 50-1000 mg twice daily Refills provided as necessary

## 2017-06-04 NOTE — Assessment & Plan Note (Signed)
HTN: The patient's blood pressure is 168/100.  She is currently on multiple antihypertensives and would benefit from further evaluation of her hypertension as this may be secondary hypertension.  Patient continues to insist upon diet and exercise to control her blood pressure but agrees to increase HCTZ to 25 mg daily from 12.5 mg daily.  Given her current lab parameters and evaluation there is no clear etiology for current hypertension.  We will reassess at her next appointment and discuss further evaluation of her resistant hypertension. For now recommend continuing her losartan 100 mg daily Carvedilol 25 mg twice daily Clonidine 0.3 mg daily

## 2017-06-04 NOTE — Telephone Encounter (Signed)
Patient is calling to let the physician know why [facility] didn't send all medicine, they send her what they had, in 20 days they will send the rest

## 2017-06-05 NOTE — Progress Notes (Signed)
Internal Medicine Clinic Attending  Case discussed with Dr. Harbrecht at the time of the visit.  We reviewed the resident's history and exam and pertinent patient test results.  I agree with the assessment, diagnosis, and plan of care documented in the resident's note.   

## 2017-06-08 ENCOUNTER — Telehealth: Payer: Self-pay | Admitting: *Deleted

## 2017-06-08 NOTE — Telephone Encounter (Signed)
Patient left message on nurse tech line requesting HCTZ be called to [facility] as she will run out before delivery from [employer]. Attempted to reach pt to learn how many tabs she needs called in. No answer and no VM set-up. Will make second attempt later. Hubbard Hartshorn, RN, BSN

## 2017-06-09 NOTE — Telephone Encounter (Signed)
Second attempt. No answer and no VM set-up. Hubbard Hartshorn, RN, BSN

## 2017-06-10 ENCOUNTER — Other Ambulatory Visit: Payer: Self-pay | Admitting: *Deleted

## 2017-06-10 DIAGNOSIS — I10 Essential (primary) hypertension: Secondary | ICD-10-CM

## 2017-06-10 MED ORDER — HYDROCHLOROTHIAZIDE 25 MG PO TABS: 25.0000 mg | ORAL_TABLET | Freq: Every day | ORAL | 0 refills | Status: DC

## 2017-06-11 ENCOUNTER — Telehealth: Payer: Self-pay

## 2017-06-11 NOTE — Telephone Encounter (Signed)
Needs to speak with a nurse about Asprin. Please call pt back.

## 2017-06-12 NOTE — Telephone Encounter (Signed)
Lm for rtc 

## 2017-06-15 ENCOUNTER — Telehealth: Payer: Self-pay | Admitting: Internal Medicine

## 2017-06-15 NOTE — Telephone Encounter (Signed)
CALLED PATIENT LMTCB, NEEDS TO RENEW GCCN/CAFA

## 2017-06-17 ENCOUNTER — Encounter: Payer: Self-pay | Admitting: Pharmacist

## 2017-06-17 NOTE — Progress Notes (Signed)
Patient approved for free program through [facility] pharmacy

## 2017-06-23 ENCOUNTER — Ambulatory Visit (INDEPENDENT_AMBULATORY_CARE_PROVIDER_SITE_OTHER): Payer: Self-pay | Admitting: Internal Medicine

## 2017-06-23 ENCOUNTER — Encounter (INDEPENDENT_AMBULATORY_CARE_PROVIDER_SITE_OTHER): Payer: Self-pay

## 2017-06-23 ENCOUNTER — Other Ambulatory Visit: Payer: Self-pay

## 2017-06-23 VITALS — BP 120/70 | HR 74 | Temp 98.2°F | Ht 68.0 in | Wt 252.8 lb

## 2017-06-23 DIAGNOSIS — E119 Type 2 diabetes mellitus without complications: Secondary | ICD-10-CM

## 2017-06-23 DIAGNOSIS — Z79899 Other long term (current) drug therapy: Secondary | ICD-10-CM

## 2017-06-23 DIAGNOSIS — R112 Nausea with vomiting, unspecified: Secondary | ICD-10-CM

## 2017-06-23 DIAGNOSIS — K219 Gastro-esophageal reflux disease without esophagitis: Secondary | ICD-10-CM

## 2017-06-23 DIAGNOSIS — Z794 Long term (current) use of insulin: Secondary | ICD-10-CM

## 2017-06-23 DIAGNOSIS — N179 Acute kidney failure, unspecified: Secondary | ICD-10-CM

## 2017-06-23 DIAGNOSIS — R1030 Lower abdominal pain, unspecified: Secondary | ICD-10-CM

## 2017-06-23 MED ORDER — PANTOPRAZOLE SODIUM 40 MG PO TBEC: 40.0000 mg | DELAYED_RELEASE_TABLET | Freq: Every day | ORAL | 11 refills | Status: DC

## 2017-06-23 NOTE — Assessment & Plan Note (Addendum)
Patient is presenting with complaint of constant nausea and postprandial vomiting x 2 weeks.  She endorses some vague intermittent suprapubic abdominal pain, but denies constipation and diarrhea.  She also denies urinary symptoms and vaginal discharge.  Patient is an insulin-dependent diabetic. She was diagnosed with diabetes in the 1990s, and reports she has been on insulin for 20+ years.  We discussed the possibility of gastroparesis and workup with gastric emptying study. We will hold off on treatment for now as not interfere the study.  Of note, she does have a history of GERD and has run out of her PPI.  However she reports these symptoms are different than her acid reflux symptoms.  --CMP to check electrolytes given frequent vomiting, and Tbili, LFTs to rule out obstructive biliary process (however this is less likely in the absence of pain) --Refill Protonix 40 mg daily --Follow-up gastric imaging study --Continue current diabetic regimen, follow-up PCP  ADDENDUM: CMP with AKI. Creatinine 1.9 from baseline 0.8. GFR down to 29-34 from normal baseline. I have called patient with results, left voicemail. Instructed patient to drink plenty of fluids, and to hold losartan if she is unable to tolerate PO. She will need repeat BMP at follow up. Otherwise LFTs, T bili, and electrolytes are within normal limits. Instructed patient to call back with questions. I have asked the front desk to schedule her for lab visit in 2 weeks, future BMP order placed. Will follow up gastric emptying study.

## 2017-06-23 NOTE — Progress Notes (Signed)
   CC: Nausea, vomitting   HPI:  [name] is a 57 y.o. female with past medical history outlined below here for nausea and vomiting. For the details of today's visit, please refer to the assessment and plan.  Past Medical History:  Diagnosis Date  . DIABETES MELLITUS, TYPE II 06/02/2006   Last HBA1C 8.7.   . HYPERLIPIDEMIA 06/02/2006   Qualifier: Diagnosis of  By: Patrice Paradise MD, Roderic Palau    . HYPERTENSION 06/02/2006   Filed Vitals:   01/21/11 1134  BP: 162/98  Pulse: 80  Temp: 97.4 F (36.3 C)  TempSrc: Oral  Resp: 20  Height: 5\' 8"  (1.727 m)  Weight: 261 lb (118.389 kg)       Review of Systems  Gastrointestinal: Positive for abdominal pain, nausea and vomiting. Negative for blood in stool, constipation and diarrhea.   Physical Exam:  Vitals:   06/23/17 1435 06/23/17 1438  BP:  120/70  Pulse: 74   Temp: 98.2 F (36.8 C)   TempSrc: Oral   SpO2: 100%   Weight: 252 lb 12.8 oz (114.7 kg)   Height: 5\' 8"  (1.727 m)     Constitutional: NAD, appears comfortable Cardiovascular: RRR, no murmurs, rubs, or gallops.  Pulmonary/Chest: CTAB, no wheezes, rales, or rhonchi.  Abdominal: Soft, non tender, non distended. Hypoactive bowel sounds Extremities: Warm and well perfused.  No edema.   Assessment & Plan:   See Encounters Tab for problem based charting.  Patient discussed with Dr. Angelia Mould

## 2017-06-23 NOTE — Patient Instructions (Addendum)
Ashley Long,  It was a pleasure to meet you. I am sorry to hear about your nausea and vomitting. I am going to check some blood work today, I will call you with these results. I have also ordered a gastric emptying study, you will be called to schedule this. I sent a refill of your Protonix to your pharmacy. Please keep your scheduled appointment with your PCP to follow up. If you have any questions or concerns, call our clinic at [phone] or after hours call [phone] and ask for the internal medicine resident on call. Thank you!  -Dr. Philipp Ovens    Gastroparesis Gastroparesis, also called delayed gastric emptying, is a condition in which food takes longer than normal to empty from the stomach. The condition is usually long-lasting (chronic). What are the causes? This condition may be caused by:  An endocrine disorder, such as hypothyroidism or diabetes. Diabetes is the most common cause of this condition.  A nervous system disease, such as Parkinson disease or multiple sclerosis.  Cancer, infection, or surgery of the stomach or vagus nerve.  A connective tissue disorder, such as scleroderma.  Certain medicines.  In most cases, the cause is not known. What increases the risk? This condition is more likely to develop in:  People with certain disorders, including endocrine disorders, eating disorders, amyloidosis, and scleroderma.  People with certain diseases, including Parkinson disease or multiple sclerosis.  People with cancer or infection of the stomach or vagus nerve.  People who have had surgery on the stomach or vagus nerve.  People who take certain medicines.  Women.  What are the signs or symptoms? Symptoms of this condition include:  An early feeling of fullness when eating.  Nausea.  Weight loss.  Vomiting.  Heartburn.  Abdominal bloating.  Inconsistent blood glucose levels.  Lack of appetite.  Acid from the stomach coming up into the esophagus  (gastroesophageal reflux).  Spasms of the stomach.  Symptoms may come and go. How is this diagnosed? This condition is diagnosed with tests, such as:  Tests that check how long it takes food to move through the stomach and intestines. These tests include: ? Upper gastrointestinal (GI) series. In this test, X-rays of the intestines are taken after you drink a liquid. The liquid makes the intestines show up better on the X-rays. ? Gastric emptying scintigraphy. In this test, scans are taken after you eat food that contains a small amount of radioactive material. ? Wireless capsule GI monitoring system. This test involves swallowing a capsule that records information about movement through the stomach.  Gastric manometry. This test measures electrical and muscular activity in the stomach. It is done with a thin tube that is passed down the throat and into the stomach.  Endoscopy. This test checks for abnormalities in the lining of the stomach. It is done with a long, thin tube that is passed down the throat and into the stomach.  An ultrasound. This test can help rule out gallbladder disease or pancreatitis as a cause of your symptoms. It uses sound waves to take pictures of the inside of your body.  How is this treated? There is no cure for gastroparesis. This condition may be managed with:  Treatment of the underlying condition causing the gastroparesis.  Lifestyle changes, including exercise and dietary changes. Dietary changes can include: ? Changes in what and when you eat. ? Eating smaller meals more often. ? Eating low-fat foods. ? Eating low-fiber forms of high-fiber foods, such as cooked  vegetables instead of raw vegetables. ? Having liquid foods in place of solid foods. Liquid foods are easier to digest.  Medicines. These may be given to control nausea and vomiting and to stimulate stomach muscles.  Getting food through a feeding tube. This may be done in severe cases.  A  gastric neurostimulator. This is a device that is inserted into the body with surgery. It helps improve stomach emptying and control nausea and vomiting.  Follow these instructions at home:  Follow your health care provider's instructions about exercise and diet.  Take medicines only as directed by your health care provider. Contact a health care provider if:  Your symptoms do not improve with treatment.  You have new symptoms. Get help right away if:  You have severe abdominal pain that does not improve with treatment.  You have nausea that does not go away.  You cannot keep fluids down. This information is not intended to replace advice given to you by your health care provider. Make sure you discuss any questions you have with your health care provider. Document Released: 03/03/2005 Document Revised: 08/09/2015 Document Reviewed: 02/27/2014 Elsevier Interactive Patient Education  [employer].

## 2017-06-24 LAB — CMP14 + ANION GAP
ALBUMIN: 4.1 g/dL (ref 3.5–5.5)
ALT: 21 IU/L (ref 0–32)
AST: 19 IU/L (ref 0–40)
Albumin/Globulin Ratio: 1.4 (ref 1.2–2.2)
Alkaline Phosphatase: 83 IU/L (ref 39–117)
Anion Gap: 18 mmol/L (ref 10.0–18.0)
BILIRUBIN TOTAL: 0.3 mg/dL (ref 0.0–1.2)
BUN/Creatinine Ratio: 12 (ref 9–23)
BUN: 22 mg/dL (ref 6–24)
CHLORIDE: 99 mmol/L (ref 96–106)
CO2: 23 mmol/L (ref 20–29)
CREATININE: 1.91 mg/dL — AB (ref 0.57–1.00)
Calcium: 9.5 mg/dL (ref 8.7–10.2)
GFR calc non Af Amer: 29 mL/min/{1.73_m2} — ABNORMAL LOW (ref >59)
GFR, EST AFRICAN AMERICAN: 34 mL/min/{1.73_m2} — AB (ref >59)
Globulin, Total: 2.9 g/dL (ref 1.5–4.5)
Glucose: 182 mg/dL — ABNORMAL HIGH (ref 65–99)
Potassium: 4.2 mmol/L (ref 3.5–5.2)
SODIUM: 140 mmol/L (ref 134–144)
TOTAL PROTEIN: 7 g/dL (ref 6.0–8.5)

## 2017-06-25 ENCOUNTER — Ambulatory Visit: Payer: Self-pay | Admitting: Dietician

## 2017-06-25 NOTE — Addendum Note (Signed)
Addended by: Jodean Lima on: 06/25/2017 04:57 PM   Modules accepted: Orders

## 2017-06-26 ENCOUNTER — Telehealth: Payer: Self-pay

## 2017-06-26 NOTE — Telephone Encounter (Signed)
Requesting test result. Please call pt back.  

## 2017-06-29 NOTE — Progress Notes (Signed)
Internal Medicine Clinic Attending  Case discussed with Dr. Guilloud at the time of the visit.  We reviewed the resident's history and exam and pertinent patient test results.  I agree with the assessment, diagnosis, and plan of care documented in the resident's note.  

## 2017-06-30 NOTE — Telephone Encounter (Signed)
This has been addressed in addendum to [place] note from 06/23/2017. Hubbard Hartshorn, RN, BSN

## 2017-07-01 ENCOUNTER — Other Ambulatory Visit: Payer: Self-pay

## 2017-07-01 ENCOUNTER — Other Ambulatory Visit: Payer: Self-pay | Admitting: Internal Medicine

## 2017-07-01 DIAGNOSIS — Z1231 Encounter for screening mammogram for malignant neoplasm of breast: Secondary | ICD-10-CM

## 2017-07-02 ENCOUNTER — Ambulatory Visit: Payer: Self-pay

## 2017-07-02 ENCOUNTER — Ambulatory Visit (INDEPENDENT_AMBULATORY_CARE_PROVIDER_SITE_OTHER): Payer: Self-pay | Admitting: Internal Medicine

## 2017-07-02 ENCOUNTER — Encounter: Payer: Self-pay | Admitting: Internal Medicine

## 2017-07-02 ENCOUNTER — Ambulatory Visit (INDEPENDENT_AMBULATORY_CARE_PROVIDER_SITE_OTHER): Payer: Self-pay | Admitting: Dietician

## 2017-07-02 ENCOUNTER — Encounter: Payer: Self-pay | Admitting: Dietician

## 2017-07-02 DIAGNOSIS — N179 Acute kidney failure, unspecified: Secondary | ICD-10-CM

## 2017-07-02 DIAGNOSIS — Z713 Dietary counseling and surveillance: Secondary | ICD-10-CM

## 2017-07-02 DIAGNOSIS — Z794 Long term (current) use of insulin: Secondary | ICD-10-CM

## 2017-07-02 DIAGNOSIS — E119 Type 2 diabetes mellitus without complications: Secondary | ICD-10-CM

## 2017-07-02 DIAGNOSIS — R112 Nausea with vomiting, unspecified: Secondary | ICD-10-CM

## 2017-07-02 NOTE — Progress Notes (Signed)
  Medical Nutrition Therapy:  Appt start time: 2878 end time:  6767. Visit # 5  Assessment:  Primary concerns today: support for new to basal bolus regimen Ms Hobday is doing well on her new insulin regimen;  She is taking: 23 units lantus daily and 5, 4, 4 units of Humalog three times a day before meals and adding a correction amount of insulin when blood sugar is >150 mg/dl. Her blood sugars are improved as evidenced by a1c drop of ~ 0.9%.   She has acute kideny injury and was taken off losartan. Blood pressure and sugars improved.  Goals for the switch to basal bolus therapy are: improved blood sugars, less frequent hypoglycemia and more flexibility. She has achieved all 3 goals.  She is working on incorporating more fruit, vegetables, whole grains, nuts, seeds and legumes.   Target established with patient today as 90-150 with target for cor3ection 100. Learning Readiness: Ready and making changes Activity- not assessed today  ANTHROPOMETRICS:not done today SLEEP:need to assess at future visit MEDICATIONS: TDD 39, ICR 1:13, correction factor ~ 50 mg/dl, 59% basal, 41% bolus BLOOD SUGAR: 13/31 >150 Date  Breakfast Lunch  Dinner  Bedtime 4/8 171  185  183 4/9 135  147  156 410 207  140  128   4/11 138  133  118 4/12 73/127  109  113  4/13 71/160  186  118   4/14 66/81/111 164  201   4/15 171  153   4/16 150  185  lo/102/145 4/17 139(5u) 132(4u) 244(7u)  4/18 161(6u) 97(4u) Progress Towards Goal(s):  In progress.   Nutritional Diagnosis:  [place]-2.3 Food-medication interaction As related to insulin and blood sugar mismatch improved As evidenced by improved blood sugars.    Intervention:  Nutrition education: how to interpret patterns in blood sugar readings and how to use that to adjust insulin, diet, activity Coordination of care: monitor blood sugars and if she continues with low blood sugars advise decrease basal slowly by 1 unit at a time until fastings 80-130. Assess need/readiness  for change in meal plan.anticipate next A1c to be lower and a true reflection of new insulin regimen and meal plan.  Teaching Method Utilized: Visual, Auditory, Hands on Handouts given during visit include: AVS, insulin pen needles, info on buying insulin pen needles Barriers to learning/adherence to lifestyle change: competing values Demonstrated degree of understanding via:  Teach Back   Monitoring/Evaluation:  Dietary intake, exercise, meter & logbook, and body weight in 3 month(s). [name], RD 07/02/2017 3:51 PM.

## 2017-07-02 NOTE — Patient Instructions (Addendum)
You are doing a wonderful job caring for your diabetes!  Two changes are: 1`write down units for meal time insulin   2- Check bedtime blood sugar 8ish and records a few times a week.  Keep up the great work eating more fruits and vegetables,whole grains, beans and nuts.   Follow up 3 months or call me.

## 2017-07-02 NOTE — Addendum Note (Signed)
Addended by: Truddie Crumble on: 07/02/2017 03:37 PM   Modules accepted: Orders

## 2017-07-02 NOTE — Progress Notes (Addendum)
Patient here for lab visit only. Will follow up repeat BMP. Gastric emptying studying scheduled for 4/29. Will have patient follow up again after study.   ADDENDUM: Persistent AKI, slightly improved from prior. Left VM with results, instructed patient to continue holding losartan & encouraged fluid intake. Will ask front desk to schedule follow up for patient after gastric emptying study.

## 2017-07-03 LAB — BMP8+ANION GAP
ANION GAP: 20 mmol/L — AB (ref 10.0–18.0)
BUN/Creatinine Ratio: 18 (ref 9–23)
BUN: 32 mg/dL — ABNORMAL HIGH (ref 6–24)
CALCIUM: 9.4 mg/dL (ref 8.7–10.2)
CO2: 20 mmol/L (ref 20–29)
CREATININE: 1.74 mg/dL — AB (ref 0.57–1.00)
Chloride: 99 mmol/L (ref 96–106)
GFR, EST AFRICAN AMERICAN: 38 mL/min/{1.73_m2} — AB (ref >59)
GFR, EST NON AFRICAN AMERICAN: 33 mL/min/{1.73_m2} — AB (ref >59)
Glucose: 121 mg/dL — ABNORMAL HIGH (ref 65–99)
Potassium: 3.7 mmol/L (ref 3.5–5.2)
SODIUM: 139 mmol/L (ref 134–144)

## 2017-07-06 ENCOUNTER — Telehealth: Payer: Self-pay

## 2017-07-06 ENCOUNTER — Telehealth: Payer: Self-pay | Admitting: Internal Medicine

## 2017-07-06 NOTE — Telephone Encounter (Signed)
PATIENT WANTS HER LAB RESULTS FROM LAST WEEK

## 2017-07-06 NOTE — Telephone Encounter (Signed)
Requesting lab results. Please call pt back.  

## 2017-07-06 NOTE — Telephone Encounter (Signed)
Sent to dr Philipp Ovens

## 2017-07-07 NOTE — Telephone Encounter (Signed)
This has been addressed in separate phone encounter from 07/06/2017. Hubbard Hartshorn, RN, BSN

## 2017-07-07 NOTE — Telephone Encounter (Signed)
Attempted to call patient with results for the second time. Left another VM reiterating results and plan - slight improvement in kidney injury but not back to normal. Continue holding losartan and encouraged fluid intake. Follow up after gastric emptying study. Instructed to call back with questions.

## 2017-07-09 ENCOUNTER — Other Ambulatory Visit: Payer: Self-pay | Admitting: Internal Medicine

## 2017-07-09 DIAGNOSIS — I10 Essential (primary) hypertension: Secondary | ICD-10-CM

## 2017-07-13 ENCOUNTER — Encounter (HOSPITAL_COMMUNITY)
Admission: RE | Admit: 2017-07-13 | Discharge: 2017-07-13 | Disposition: A | Discharge status: Home / Self Care | Payer: Self-pay | Source: Ambulatory Visit | Attending: Internal Medicine | Admitting: Internal Medicine

## 2017-07-13 DIAGNOSIS — R112 Nausea with vomiting, unspecified: Secondary | ICD-10-CM | POA: Insufficient documentation

## 2017-07-13 MED ORDER — TECHNETIUM TC 99M SULFUR COLLOID
2.0000 | Freq: Once | INTRAVENOUS | Status: AC | PRN
  Administered 2017-07-13: 2 via ORAL

## 2017-07-14 ENCOUNTER — Telehealth: Payer: Self-pay | Admitting: Dietician

## 2017-07-14 NOTE — Telephone Encounter (Signed)
Ashley Long reports she has gone up on her nightime insulin to 24 units and her bedtime readings are 173 and 227, she will go up to 25 to night if okay. Explained that her basal insulin is usual adjusted based on her fasting reading and she agreed to hold her basal insulin to 24 units. Her fasting readings for the past two days have been: 190, 196,  Which is higher that they had been, she agrees to try increasing her Humalog at dinner to 5 units and checking bedtime readings x 3 nights with target of 110-160 at bedtime. She will contnue to monitor fasting readings.

## 2017-07-14 NOTE — Progress Notes (Signed)
Called patient with results of gastric emptying study which was normal, left voicemail. Unclear reason for N/V. Patient has appointment scheduled in [facility] on 5/2. Will need repeat BMP. Please reassess symptoms. Continue holding losartan if not tolerating PO. Encouraged patient to keep appointment. Instructed to call back with questions.

## 2017-07-16 ENCOUNTER — Ambulatory Visit: Payer: Self-pay

## 2017-07-16 ENCOUNTER — Ambulatory Visit (INDEPENDENT_AMBULATORY_CARE_PROVIDER_SITE_OTHER): Payer: Self-pay | Admitting: Internal Medicine

## 2017-07-16 VITALS — BP 164/81 | HR 78

## 2017-07-16 DIAGNOSIS — R110 Nausea: Secondary | ICD-10-CM

## 2017-07-16 DIAGNOSIS — R112 Nausea with vomiting, unspecified: Secondary | ICD-10-CM

## 2017-07-16 DIAGNOSIS — I10 Essential (primary) hypertension: Secondary | ICD-10-CM

## 2017-07-16 DIAGNOSIS — N179 Acute kidney failure, unspecified: Secondary | ICD-10-CM

## 2017-07-16 DIAGNOSIS — Z79899 Other long term (current) drug therapy: Secondary | ICD-10-CM

## 2017-07-16 LAB — BASIC METABOLIC PANEL
Anion gap: 9 (ref 5–15)
BUN: 37 mg/dL — ABNORMAL HIGH (ref 6–20)
CO2: 27 mmol/L (ref 22–32)
Calcium: 9.8 mg/dL (ref 8.9–10.3)
Chloride: 103 mmol/L (ref 101–111)
Creatinine, Ser: 1.47 mg/dL — ABNORMAL HIGH (ref 0.44–1.00)
GFR calc Af Amer: 45 mL/min — ABNORMAL LOW (ref >60)
GFR calc non Af Amer: 39 mL/min — ABNORMAL LOW (ref >60)
Glucose, Bld: 163 mg/dL — ABNORMAL HIGH (ref 65–99)
Potassium: 3.9 mmol/L (ref 3.5–5.1)
Sodium: 139 mmol/L (ref 135–145)

## 2017-07-16 NOTE — Progress Notes (Signed)
   CC: Evaluation of her recent acute renal injury  HPI:Ms.SIDRAH HARDEN is a 57 y.o. female who presents for evaluation of recent acute renal injury of unknown etiology.  Nausea and vomiting: Patient, nausea vomiting has improved significantly.  She is now experiencing occasional nausea.  Gastroparesis study was negative.  Unclear etiology.  Possibly concerning for pancreatitis but without imaging or a lipase near the onset of symptoms this is unclear. I do not believe a lipase would be beneficial now 6 weeks out.  We will continue to monitor this and advised the patient to return at any point if she develops symptoms concerning for pancreatitis or recurrence of her nausea, vomiting, abdominal pain or discomfort.  Of other concern is a gastric ulcer, gastritis, gastroenteritis.  Acute renal injury: Patient initially for severe nausea and vomiting 2 weeks on 06/25/2017 that was intractable.  This was associated with notable history of reflux.  Routine CMP for electrolyte evaluation, T bili, LFTs was obtained to rule out a biliary process.  This revealed an acute increase in creatinine to 1.9 from baseline 0.8.  This concerned that this may be initiated by losartan.  As such her losartan was held to return biweekly for BMP to monitor renal function.  Plan: BMP with Cr down to 1.47 Patient to return in two weeks for repeat BMP We will discuss restarting her losartan at that time.   HTN: Markedly elevated today to 189/121. Patient stated that her BP has remained stable at home ranging from 366-294 systolic while off of her losartan. Continue holding Losartan for two weeks. Follow up in clinic in two weeks for reassessment of BP and for a BMP to monitor renal function.   Our next visit will be the 26th of next month.    Past Medical History:  Diagnosis Date  . DIABETES MELLITUS, TYPE II 06/02/2006   Last HBA1C 8.7.   . HYPERLIPIDEMIA 06/02/2006   Qualifier: Diagnosis of  By: Patrice Paradise MD,  Roderic Palau    . HYPERTENSION 06/02/2006   Filed Vitals:   01/21/11 1134  BP: 162/98  Pulse: 80  Temp: 97.4 F (36.3 C)  TempSrc: Oral  Resp: 20  Height: 5\' 8"  (1.727 m)  Weight: 261 lb (118.389 kg)      Review of Systems: ROS 10 except as per HPI  Physical Exam:  Vitals:   07/16/17 1408  BP: (!) 189/121  Pulse: 78  SpO2: 100%   General: Conversant, afebrile, nondiaphoretic, appears comfortable Cardiovascular: RRR no murmurs rubs or gallops auscultated Pulmonary: Lungs are clear to auscultation bilaterally with no rhonchi or wheezing GI: Abdomen soft, nontender, nondistended with normal bowel sounds  Assessment & Plan:   See Encounters Tab for problem based charting.  Patient discussed with Dr. Rebeca Alert

## 2017-07-16 NOTE — Patient Instructions (Signed)
FOLLOW-UP INSTRUCTIONS When: two weeks, please schedule at the front desk  For: BMP and BP check What to bring: you  Please continue to hold the losartan at this time. We will need to check another BMP in two weeks for renal function to determine when you should restart the medication. If your symptoms worsen at any time please feel free to contact us an let us know.   Thank you again for your visit today.

## 2017-07-17 DIAGNOSIS — N179 Acute kidney failure, unspecified: Secondary | ICD-10-CM

## 2017-07-17 HISTORY — DX: Acute kidney failure, unspecified: N17.9

## 2017-07-17 NOTE — Progress Notes (Signed)
Internal Medicine Clinic Attending  Case discussed with Dr. Harbrecht  at the time of the visit.  We reviewed the resident's history and exam and pertinent patient test results.  I agree with the assessment, diagnosis, and plan of care documented in the resident's note.  Ashley N Raines, MD   

## 2017-07-17 NOTE — Assessment & Plan Note (Signed)
  Acute renal injury: Patient initially for severe nausea and vomiting 2 weeks on 06/25/2017 that was intractable.  This was associated with notable history of reflux.  Routine CMP for electrolyte evaluation, T bili, LFTs was obtained to rule out a biliary process.  This revealed an acute increase in creatinine to 1.9 from baseline 0.8.  This concerned that this may be initiated by losartan.  As such her losartan was held to return biweekly for BMP to monitor renal function.  Plan: BMP with Cr down to 1.47 Patient to return in two weeks for repeat BMP We will discuss restarting her losartan at that time.

## 2017-07-17 NOTE — Assessment & Plan Note (Signed)
HTN: Markedly elevated today to 189/121. Patient stated that her BP has remained stable at home ranging from 791-505 systolic while off of her losartan. Continue holding Losartan for two weeks. Follow up in clinic in two weeks for reassessment of BP and for a BMP to monitor renal function.   Our next visit will be the 26th of next month.

## 2017-07-17 NOTE — Assessment & Plan Note (Signed)
  Nausea and vomiting: Patient, nausea vomiting has improved significantly.  She is now experiencing occasional nausea.  Gastroparesis study was negative.  Unclear etiology.  Possibly concerning for pancreatitis but without imaging or a lipase near the onset of symptoms this is unclear. I do not believe a lipase would be beneficial now 6 weeks out.  We will continue to monitor this and advised the patient to return at any point if she develops symptoms concerning for pancreatitis or recurrence of her nausea, vomiting, abdominal pain or discomfort.  Of other concern is a gastric ulcer, gastritis, gastroenteritis.

## 2017-07-23 ENCOUNTER — Ambulatory Visit
Admission: RE | Admit: 2017-07-23 | Discharge: 2017-07-23 | Disposition: A | Discharge status: Home / Self Care | Payer: No Typology Code available for payment source | Source: Ambulatory Visit | Attending: Internal Medicine | Admitting: Internal Medicine

## 2017-07-23 DIAGNOSIS — Z1231 Encounter for screening mammogram for malignant neoplasm of breast: Secondary | ICD-10-CM

## 2017-07-29 ENCOUNTER — Telehealth: Payer: Self-pay | Admitting: Dietician

## 2017-07-29 NOTE — Telephone Encounter (Signed)
Ashley Long called to provide blood sugar readings. She says she has held her basal insulin to 24 units and 2 readings after 9 Pm were 173 and 227. She has also been having readings in the 80-120 range more often and also working on her diet choices and weight loss. She has an appointment this Thursday and will copy her book to provide readings rather than read them over the phone.

## 2017-07-30 ENCOUNTER — Ambulatory Visit (INDEPENDENT_AMBULATORY_CARE_PROVIDER_SITE_OTHER): Payer: No Typology Code available for payment source | Admitting: Dietician

## 2017-07-30 ENCOUNTER — Ambulatory Visit (INDEPENDENT_AMBULATORY_CARE_PROVIDER_SITE_OTHER): Payer: Self-pay | Admitting: Internal Medicine

## 2017-07-30 VITALS — BP 160/80 | HR 78 | Wt 252.3 lb

## 2017-07-30 DIAGNOSIS — K219 Gastro-esophageal reflux disease without esophagitis: Secondary | ICD-10-CM

## 2017-07-30 DIAGNOSIS — J302 Other seasonal allergic rhinitis: Secondary | ICD-10-CM

## 2017-07-30 DIAGNOSIS — E119 Type 2 diabetes mellitus without complications: Secondary | ICD-10-CM

## 2017-07-30 DIAGNOSIS — Z794 Long term (current) use of insulin: Secondary | ICD-10-CM

## 2017-07-30 DIAGNOSIS — N179 Acute kidney failure, unspecified: Secondary | ICD-10-CM

## 2017-07-30 DIAGNOSIS — Z79899 Other long term (current) drug therapy: Secondary | ICD-10-CM

## 2017-07-30 DIAGNOSIS — Z713 Dietary counseling and surveillance: Secondary | ICD-10-CM

## 2017-07-30 DIAGNOSIS — I10 Essential (primary) hypertension: Secondary | ICD-10-CM

## 2017-07-30 NOTE — Progress Notes (Signed)
   CC: Hypertension follow-up  HPI:Ashley Long is a 57 y.o. female who presents today for evaluation of her hypertension.  Hypertension: Patient's blood pressure was previously well controlled on a combination of amlodipine 10 mg daily, carvedilol 25 mg twice daily, clonidine 0.8 mg daily, HCTZ 25 mg daily, losartan 100 mg daily.  The latter of which however, was discontinued when the patient developed an AKI.  Her renal function has since greatly improved with a creatinine decreasing from 1.91-1.47 on repeat again today. The initial increase was accompanied by severe nausea and vomiting of a prolonged nature lasting greater than 3 days with persistent occasional vomiting x4 weeks.  There is no clear definable etiology to the onset the symptoms and the patient was afebrile, nondiaphoretic, had no known food exposures, other concerns noted that may assist with a definitive diagnosis.  Of greatest concern was esophageal reflux disease atypically presenting for which she was placed on high-dose PPI with slow resolution of her symptoms since that time. The patient endorses a sensation of something regurgitating from her abdomin following meals which induced the nausea and eventually the vomiting.  The patient has with her today her home sphygmomanometer which demonstrates a blood pressure average of approximately 122/85.  As such, it feels as if she is experiencing some degree of whitecoat hypertension.  Plan: Continue HCTZ 25 mg daily Continue clonidine 0.8 mg daily Continue carvedilol 20 mg twice daily Continue amlodipine 10 mg daily Continue holding losartan for now We will repeat patient's BMP at her visit with me on June 26 and evaluate for the need for additional and alternative antihypertensives at that time We will need to consider an EGD with GI consult if her nausea/vomiting persists  Past Medical History:  Diagnosis Date  . DIABETES MELLITUS, TYPE II 06/02/2006   Last HBA1C 8.7.     . HYPERLIPIDEMIA 06/02/2006   Qualifier: Diagnosis of  By: Patrice Paradise MD, Roderic Palau    . HYPERTENSION 06/02/2006   Filed Vitals:   01/21/11 1134  BP: 162/98  Pulse: 80  Temp: 97.4 F (36.3 C)  TempSrc: Oral  Resp: 20  Height: 5\' 8"  (1.727 m)  Weight: 261 lb (118.389 kg)      Review of Systems: ROS negative except as per HPI  Physical Exam:  Vitals:   07/30/17 1515  BP: (!) 160/80  Pulse: 78  SpO2: 98%  Weight: 252 lb 4.8 oz (114.4 kg)   Physical Exam  Constitutional: She appears well-developed and well-nourished. No distress.  Eyes: EOM are normal.  Cardiovascular: Normal rate and regular rhythm.  No murmur heard. Pulmonary/Chest: Effort normal and breath sounds normal. No respiratory distress.  Musculoskeletal: She exhibits no edema or tenderness.  Skin: She is not diaphoretic.  Psychiatric: She has a normal mood and affect.  Vitals reviewed.  Assessment & Plan:   See Encounters Tab for problem based charting.  Patient discussed with Dr. Dareen Piano

## 2017-07-30 NOTE — Patient Instructions (Addendum)
FOLLOW-UP INSTRUCTIONS When: Our next visit in June or sooner if we call you and request a visit based on the results of today's laboratory analysis What to bring: Your glucometer and allergy medications  Thank you for your visit to the [facility] Internal medicine clinic.  I will call you with any concerning findings to the BMP today and notify you of any changes in medications based on this lab.  Please continue your current medications at this time follow-up with me at our next currently scheduled appointment.  Please continue the good work on your diabetes and hypertension.   Gastroesophageal Reflux Disease, Adult Normally, food travels down the esophagus and stays in the stomach to be digested. If a person has gastroesophageal reflux disease (GERD), food and stomach acid move back up into the esophagus. When this happens, the esophagus becomes sore and swollen (inflamed). Over time, GERD can make small holes (ulcers) in the lining of the esophagus. Follow these instructions at home: Diet  Follow a diet as told by your doctor. You may need to avoid foods and drinks such as: ? Coffee and tea (with or without caffeine). ? Drinks that contain alcohol. ? Energy drinks and sports drinks. ? Carbonated drinks or sodas. ? Chocolate and cocoa. ? Peppermint and mint flavorings. ? Garlic and onions. ? Horseradish. ? Spicy and acidic foods, such as peppers, chili powder, curry powder, vinegar, hot sauces, and BBQ sauce. ? Citrus fruit juices and citrus fruits, such as oranges, lemons, and limes. ? Tomato-based foods, such as red sauce, chili, salsa, and pizza with red sauce. ? Fried and fatty foods, such as donuts, french fries, potato chips, and high-fat dressings. ? High-fat meats, such as hot dogs, rib eye steak, sausage, ham, and bacon. ? High-fat dairy items, such as whole milk, butter, and cream cheese.  Eat small meals often. Avoid eating large meals.  Avoid drinking large amounts of  liquid with your meals.  Avoid eating meals during the 2-3 hours before bedtime.  Avoid lying down right after you eat.  Do not exercise right after you eat. General instructions  Pay attention to any changes in your symptoms.  Take over-the-counter and prescription medicines only as told by your doctor. Do not take aspirin, ibuprofen, or other NSAIDs unless your doctor says it is okay.  Do not use any tobacco products, including cigarettes, chewing tobacco, and e-cigarettes. If you need help quitting, ask your doctor.  Wear loose clothes. Do not wear anything tight around your waist.  Raise (elevate) the head of your bed about 6 inches (15 cm).  Try to lower your stress. If you need help doing this, ask your doctor.  If you are overweight, lose an amount of weight that is healthy for you. Ask your doctor about a safe weight loss goal.  Keep all follow-up visits as told by your doctor. This is important. Contact a doctor if:  You have new symptoms.  You lose weight and you do not know why it is happening.  You have trouble swallowing, or it hurts to swallow.  You have wheezing or a cough that keeps happening.  Your symptoms do not get better with treatment.  You have a hoarse voice. Get help right away if:  You have pain in your arms, neck, jaw, teeth, or back.  You feel sweaty, dizzy, or light-headed.  You have chest pain or shortness of breath.  You throw up (vomit) and your throw up looks like blood or coffee grounds.  You pass out (faint).  Your poop (stool) is bloody or black.  You cannot swallow, drink, or eat. This information is not intended to replace advice given to you by your health care provider. Make sure you discuss any questions you have with your health care provider. Document Released: 08/20/2007 Document Revised: 08/09/2015 Document Reviewed: 06/28/2014 Elsevier Interactive Patient Education  [employer].

## 2017-07-31 ENCOUNTER — Encounter: Payer: Self-pay | Admitting: Internal Medicine

## 2017-07-31 ENCOUNTER — Encounter: Payer: Self-pay | Admitting: Dietician

## 2017-07-31 LAB — BMP8+ANION GAP
ANION GAP: 18 mmol/L (ref 10.0–18.0)
BUN / CREAT RATIO: 20 (ref 9–23)
BUN: 29 mg/dL — ABNORMAL HIGH (ref 6–24)
CO2: 24 mmol/L (ref 20–29)
Calcium: 9.7 mg/dL (ref 8.7–10.2)
Chloride: 97 mmol/L (ref 96–106)
Creatinine, Ser: 1.47 mg/dL — ABNORMAL HIGH (ref 0.57–1.00)
GFR calc Af Amer: 46 mL/min/{1.73_m2} — ABNORMAL LOW (ref >59)
GFR, EST NON AFRICAN AMERICAN: 40 mL/min/{1.73_m2} — AB (ref >59)
Glucose: 133 mg/dL — ABNORMAL HIGH (ref 65–99)
POTASSIUM: 3.9 mmol/L (ref 3.5–5.2)
SODIUM: 139 mmol/L (ref 134–144)

## 2017-07-31 NOTE — Assessment & Plan Note (Addendum)
  Hypertension: Patient's blood pressure was previously well controlled on a combination of amlodipine 10 mg daily, carvedilol 25 mg twice daily, clonidine 0.8 mg daily, HCTZ 25 mg daily, losartan 100 mg daily.  The latter of which however, was discontinued when the patient developed an AKI.  Her renal function has since greatly improved with a creatinine decreasing from 1.91-1.47 on repeat again today. The initial increase was accompanied by severe nausea and vomiting of a prolonged nature lasting greater than 3 days with persistent occasional vomiting x4 weeks.  There is no clear definable etiology to the onset the symptoms and the patient was afebrile, nondiaphoretic, had no known food exposures, other concerns noted that may assist with a definitive diagnosis.  Of greatest concern was esophageal reflux disease atypically presenting for which she was placed on high-dose PPI with slow resolution of her symptoms since that time. The patient endorses a sensation of something regurgitating from her abdomin following meals which induced the nausea and eventually the vomiting.  The patient has with her today her home sphygmomanometer which demonstrates a blood pressure average of approximately 122/85.  As such, it feels as if she is experiencing some degree of whitecoat hypertension.  Plan: Continue HCTZ 25 mg daily Continue clonidine 0.8 mg daily Continue carvedilol 20 mg twice daily Continue amlodipine 10 mg daily Continue holding losartan for now We will repeat patient's BMP at her visit with me on June 26 and evaluate for the need for additional and alternative antihypertensives at that time We will need to consider an EGD with GI consult if her nausea/vomiting persists  Addendum:  Spoke with the patient regarding her persistently elevated creatinine 1.47.  I informed her that the plan would be to recheck a BMP at her visit on June 26 to further evaluate the degree of renal insult that has occurred.   If between now and then she develops recurrent symptoms of nausea and vomiting supplementing please further evaluate her for dehydration time.

## 2017-07-31 NOTE — Progress Notes (Signed)
  Medical Nutrition Therapy:  Appt start time: 8891 end time:  6945. Visit # 6  Assessment:  Primary concerns today: support for new to basal bolus regimen Ashley Long is doing well on her new insulin regimen;  She is taking: 24 units lantus daily and 5, 4, 4 units of Humalog three times a day before meals and adding a correction amount of insulin when blood sugar is >150 mg/dl.  Her abdomen shows bruising, she says this is from [employer] pen needles. They have trouble keeping the shorter ones in stick, so she uses ones that are longer. Assisted her with finding shorter ones in her price range on the internet.  Goals for the switch to basal bolus therapy are: improved blood sugars, less frequent hypoglycemia and more flexibility. She has achieved all 3 goals.  She is working on incorporating more fruit, vegetables, whole grains, nuts, seeds and legumes.   Target established with patient today as 90-150 with target for correction 100. Learning Readiness: Ready and making changes Activity- not assessed today  ANTHROPOMETRICS: weight stable Wt Readings from Last 3 Encounters:  07/30/17 252 lb 4.8 oz (114.4 kg)  06/23/17 252 lb 12.8 oz (114.7 kg)  06/03/17 254 lb (115.2 kg)   SLEEP:need to assess at future visit MEDICATIONS: TDD 40, ICR 1:13, correction factor ~ 45 mg/dl, 60% basal, 40% bolus Injection sites: Uses abdomen for rapid acting and thighs for basal insulin. She rotates sites correctly.  bruising without lipohypertrophy noted as above on her abdomen  BLOOD SUGAR: mostly in 100s, checking before meal sand 2-3x/week at bedtime, had one fasting of 311 that likely came from hypoglycemia as her bedtime reading the night before was 100.   Progress Towards Goal(s):  In progress.   Nutritional Diagnosis:  [place]-2.3 Food-medication interaction As related to insulin and blood sugar mismatch improved  As evidenced by improved blood sugars and her confidence in adjusting her insulin dose based on blodo  sugars and food intake.    Intervention:  Nutrition education: how to care for bruised injection sites, obtaining shorter and thinner pen needles.  Coordination of care: await repeat A1c, monitor bruising in abdomen.  Teaching Method Utilized: Visual, Auditory, Hands on Handouts given during visit include: AVS, insulin pen needles, info on buying insulin pen needles Barriers to learning/adherence to lifestyle change: competing values Demonstrated degree of understanding via:  Teach Back   Monitoring/Evaluation:  Dietary intake, exercise, meter & logbook, and body weight in 3 month(s). [name], RD 07/31/2017 10:59 AM.

## 2017-08-03 NOTE — Progress Notes (Signed)
Internal Medicine Clinic Attending  Case discussed with Dr. Harbrecht at the time of the visit.  We reviewed the resident's history and exam and pertinent patient test results.  I agree with the assessment, diagnosis, and plan of care documented in the resident's note.   

## 2017-08-11 ENCOUNTER — Other Ambulatory Visit: Payer: Self-pay | Admitting: Internal Medicine

## 2017-08-11 ENCOUNTER — Telehealth: Payer: Self-pay | Admitting: Dietician

## 2017-08-11 ENCOUNTER — Encounter: Payer: Self-pay | Admitting: Dietician

## 2017-08-11 DIAGNOSIS — I10 Essential (primary) hypertension: Secondary | ICD-10-CM

## 2017-08-11 NOTE — Telephone Encounter (Signed)
Health department agreed to allow 100 strips at a time. Patient notified.

## 2017-08-11 NOTE — Telephone Encounter (Signed)
Discussed blood sugars with Ashley Long. She had an episode of hypoglycemia today after breakfast. Blood sugar was 76 today fasting, (most days it is 100-120) drank ~4oz  juice that increased her blood sugar to 152, got hot and sweaty about 2 hours later- blood sugar was 145, then 2 hours later 64. Ate 4 gluc tablets- then 84 and ate lunch Taught correction insulin scale startign at blood sugar of 80 and less to decrease her meal tine insulin by 1 unit. Ashley Long verbalized understanding. She also agreed to call for further hypoglycemia problems and to check her bedtime readings x3 nights a week. She is running out of test strips due to Health department rule of only 50 strips at a time.

## 2017-08-12 NOTE — Telephone Encounter (Signed)
Thank you :)

## 2017-08-19 ENCOUNTER — Other Ambulatory Visit: Payer: Self-pay | Admitting: *Deleted

## 2017-08-19 DIAGNOSIS — I10 Essential (primary) hypertension: Secondary | ICD-10-CM

## 2017-08-19 NOTE — Telephone Encounter (Signed)
Received message from clinic staff that patient had called requesting a refill on her amlodipine to be sent to [facility]).  Pt is completely out and would also like a one time refill sent [facility] the [facility].  She is no longer on losartan and asks for assistance in [place] to stop continued delivery of losartan. Will check with pcp before telling pharmacy to discontinue medication.  Of note, [place] [facility] states pt informed them that she was no longer taking amlodipine.  Attempted to contact patient to confirm-no answer, unable to leave message..  Will send request to pcp for review.Despina Hidden Cassady6/5/201912:22 PM  Is patient suppose to be taking amlodipine and losartan? Please advise.Despina Hidden Cassady6/5/201912:29 PM

## 2017-08-19 NOTE — Telephone Encounter (Signed)
Left message on patient's self-identified VM requesting return call to discuss below. Hubbard Hartshorn, RN, BSN

## 2017-08-19 NOTE — Telephone Encounter (Signed)
So she is on Amlodipine and we are currently holding her Losartan for now. She is to return on 6/26 for a BMP and we will determine if this can be restarted at that time.   Thank you and sorry for the confusion.  Rivka Safer

## 2017-08-19 NOTE — Telephone Encounter (Signed)
Pt is aware of f/u appt and understands

## 2017-08-20 MED ORDER — AMLODIPINE BESYLATE 10 MG PO TABS: 10.0000 mg | ORAL_TABLET | Freq: Every day | ORAL | 0 refills | Status: DC

## 2017-09-09 ENCOUNTER — Telehealth: Payer: Self-pay | Admitting: Dietician

## 2017-09-09 ENCOUNTER — Other Ambulatory Visit: Payer: Self-pay

## 2017-09-09 ENCOUNTER — Ambulatory Visit (INDEPENDENT_AMBULATORY_CARE_PROVIDER_SITE_OTHER): Payer: Self-pay | Admitting: Internal Medicine

## 2017-09-09 ENCOUNTER — Encounter: Payer: Self-pay | Admitting: Internal Medicine

## 2017-09-09 VITALS — BP 142/82 | HR 73 | Temp 98.1°F | Ht 68.0 in | Wt 248.9 lb

## 2017-09-09 DIAGNOSIS — Z794 Long term (current) use of insulin: Secondary | ICD-10-CM

## 2017-09-09 DIAGNOSIS — Z79899 Other long term (current) drug therapy: Secondary | ICD-10-CM

## 2017-09-09 DIAGNOSIS — I10 Essential (primary) hypertension: Secondary | ICD-10-CM

## 2017-09-09 DIAGNOSIS — E119 Type 2 diabetes mellitus without complications: Secondary | ICD-10-CM

## 2017-09-09 LAB — POCT GLYCOSYLATED HEMOGLOBIN (HGB A1C): HEMOGLOBIN A1C: 8 % — AB (ref 4.0–5.6)

## 2017-09-09 LAB — GLUCOSE, CAPILLARY: GLUCOSE-CAPILLARY: 116 mg/dL — AB (ref 70–99)

## 2017-09-09 NOTE — Telephone Encounter (Signed)
Ashley Long called from waiting room to give me her blood sugar log for review. Reviewed it with Dr. Berline Lopes.  [name] agreed to make an appointment to review carb counting and her insulin regimen.

## 2017-09-09 NOTE — Progress Notes (Signed)
   CC: Routine visit for HTN and diabetes follow-up  HPI:[name] is a 57 y.o. female who presents today for routine evaluation of her Blood pressure and diabetes.  HTN: Returning today for BP and renal function evaluation. BP was improved. I reviewed her home BP readings in person and note an average of 130 SYS. While in the room the patient repeated her BP with her machine and was found to be hypertensive to 160 sys which was consistent with the 152/92 in our office. Her BP improved after sitting still for several minutes to 142/82. I will not make any changes today. She continues to make positive changes and improvements.   Plan: BMP today: Continue HCTZ 25 mg daily Continue clonidine 0.8 mg daily Continue carvedilol 20 mg twice daily Continue amlodipine 10 mg daily Continue holding losartan for now until the results of the BMP return  Diabetes: A1c 8.2% on her visit on 06/03/2017. She continues to be compliant with her medications, carb counting and dietary modifications. She denied being symptomatic to her few low blood glucose readings of ~65. She stated that she continue her carb counting and feels that she is well able to continue her home regimen as discussed with [name]. I agree that she has made excellent progress and enjoy our appointments as she is motivated to maintain and improve her healthcare.   Plan: Order a Microalbuminuria/Creatinine urine today Continue Glargine 25U QHS Continue Humalog 3-8U TID w/ meals Continue Sitagliptin-Metformin 50-1000 MG BID A1c today 8.0%  Preventative Health: Schedule or see ophthalmology this past month.  Past Medical History:  Diagnosis Date  . DIABETES MELLITUS, TYPE II 06/02/2006   Last HBA1C 8.7.   . HYPERLIPIDEMIA 06/02/2006   Qualifier: Diagnosis of  By: Patrice Paradise MD, Roderic Palau    . HYPERTENSION 06/02/2006   Filed Vitals:   01/21/11 1134  BP: 162/98  Pulse: 80  Temp: 97.4 F (36.3 C)  TempSrc: Oral  Resp: 20  Height: 5\' 8"   (1.727 m)  Weight: 261 lb (118.389 kg)      Review of Systems:  ROS negative except as per HPI.  Physical Exam:  Vitals:   09/09/17 1510 09/09/17 1549  BP: (!) 152/92 (!) 142/82  Pulse: 73   Temp: 98.1 F (36.7 C)   TempSrc: Oral   SpO2: 100%   Weight: 248 lb 14.4 oz (112.9 kg)   Height: 5\' 8"  (1.727 m)    Physical Exam  Cardiovascular: Normal rate and regular rhythm.  No murmur heard. Pulmonary/Chest: Effort normal and breath sounds normal. No respiratory distress.  Musculoskeletal: She exhibits no edema or tenderness.  Skin: Skin is warm. Capillary refill takes less than 2 seconds. No rash noted. She is not diaphoretic. No erythema.  Psychiatric: She has a normal mood and affect.  Vitals reviewed.  Assessment & Plan:   See Encounters Tab for problem based charting.  Patient discussed with Dr. Beryle Beams

## 2017-09-09 NOTE — Assessment & Plan Note (Addendum)
HTN: Returning today for BP and renal function evaluation. BP was improved. I reviewed her home BP readings in person and note an average of 130 SYS. While in the room the patient repeated her BP with her machine and was found to be hypertensive to 160 sys which was consistent with the 152/92 in our office. Her BP improved after sitting still for several minutes to 142/82. I will not make any changes today. She continues to make positive changes and improvements.   Plan: BMP today: Continue HCTZ 25 mg daily Continue clonidine 0.8 mg daily Continue carvedilol 20 mg twice daily Continue amlodipine 10 mg daily Continue holding losartan for now until the results of the BMP return

## 2017-09-09 NOTE — Patient Instructions (Signed)
FOLLOW-UP INSTRUCTIONS When: Three months For: Routine visit What to bring: All of your medications  Thank you for your visit to the [facility] today.  I have ordered several labs and will call you with the results when they return.  Please call us at any time if you have any questions. For now, I would recommend that you remain off of the Losartan. We will let you know if you should resume it.

## 2017-09-09 NOTE — Assessment & Plan Note (Addendum)
  Diabetes: A1c 8.2% on her visit on 06/03/2017. She continues to be compliant with her medications, carb counting and dietary modifications. She denied being symptomatic to her few low blood glucose readings of ~65. She stated that she continue her carb counting and feels that she is well able to continue her home regimen as discussed with [name]. I agree that she has made excellent progress and enjoy our appointments as she is motivated to maintain and improve her healthcare.   Plan: Order a Microalbuminuria/Creatinine urine today Continue Glargine 25U QHS Continue Humalog 3-8U TID w/ meals Continue Sitagliptin-Metformin 50-1000 MG BID A1c today 8.0%

## 2017-09-10 LAB — BMP8+ANION GAP
ANION GAP: 16 mmol/L (ref 10.0–18.0)
BUN / CREAT RATIO: 15 (ref 9–23)
BUN: 24 mg/dL (ref 6–24)
CHLORIDE: 100 mmol/L (ref 96–106)
CO2: 23 mmol/L (ref 20–29)
Calcium: 9.5 mg/dL (ref 8.7–10.2)
Creatinine, Ser: 1.55 mg/dL — ABNORMAL HIGH (ref 0.57–1.00)
GFR calc Af Amer: 43 mL/min/{1.73_m2} — ABNORMAL LOW (ref >59)
GFR calc non Af Amer: 37 mL/min/{1.73_m2} — ABNORMAL LOW (ref >59)
GLUCOSE: 118 mg/dL — AB (ref 65–99)
POTASSIUM: 3.6 mmol/L (ref 3.5–5.2)
SODIUM: 139 mmol/L (ref 134–144)

## 2017-09-10 LAB — MICROALBUMIN / CREATININE URINE RATIO
CREATININE, UR: 404.9 mg/dL
MICROALBUM., U, RANDOM: 159.2 ug/mL
Microalb/Creat Ratio: 39.3 mg/g creat — ABNORMAL HIGH (ref 0.0–30.0)

## 2017-09-10 NOTE — Progress Notes (Signed)
Medicine attending: Medical history, presenting problems, physical findings, and medications, reviewed with resident physician Dr Leilani Merl on the day of the patient visit and I concur with his evaluation and management plan.

## 2017-09-15 ENCOUNTER — Telehealth: Payer: Self-pay

## 2017-09-15 ENCOUNTER — Encounter: Payer: Self-pay | Admitting: Internal Medicine

## 2017-09-15 NOTE — Telephone Encounter (Signed)
Requesting lab result. Please call back. 

## 2017-09-15 NOTE — Progress Notes (Signed)
Called Patient and left voicemail that I would mail her results to her.

## 2017-09-15 NOTE — Telephone Encounter (Signed)
Called pt - informed  "Called Patient and left voicemail that I would mail her results to her." per Dr Berline Lopes. Stated ok and will wait on resutls.

## 2017-09-23 ENCOUNTER — Ambulatory Visit: Payer: No Typology Code available for payment source | Admitting: Dietician

## 2017-12-02 ENCOUNTER — Encounter: Payer: Self-pay | Admitting: Internal Medicine

## 2017-12-02 ENCOUNTER — Ambulatory Visit (INDEPENDENT_AMBULATORY_CARE_PROVIDER_SITE_OTHER): Payer: Self-pay | Admitting: Internal Medicine

## 2017-12-02 ENCOUNTER — Other Ambulatory Visit: Payer: Self-pay

## 2017-12-02 VITALS — BP 170/80 | HR 76 | Wt 243.6 lb

## 2017-12-02 DIAGNOSIS — E785 Hyperlipidemia, unspecified: Secondary | ICD-10-CM

## 2017-12-02 DIAGNOSIS — N182 Chronic kidney disease, stage 2 (mild): Secondary | ICD-10-CM

## 2017-12-02 DIAGNOSIS — I129 Hypertensive chronic kidney disease with stage 1 through stage 4 chronic kidney disease, or unspecified chronic kidney disease: Secondary | ICD-10-CM

## 2017-12-02 DIAGNOSIS — E1122 Type 2 diabetes mellitus with diabetic chronic kidney disease: Secondary | ICD-10-CM

## 2017-12-02 DIAGNOSIS — Z79899 Other long term (current) drug therapy: Secondary | ICD-10-CM

## 2017-12-02 DIAGNOSIS — Z23 Encounter for immunization: Secondary | ICD-10-CM

## 2017-12-02 DIAGNOSIS — R7989 Other specified abnormal findings of blood chemistry: Secondary | ICD-10-CM

## 2017-12-02 DIAGNOSIS — I10 Essential (primary) hypertension: Secondary | ICD-10-CM

## 2017-12-02 DIAGNOSIS — E119 Type 2 diabetes mellitus without complications: Secondary | ICD-10-CM

## 2017-12-02 DIAGNOSIS — N183 Chronic kidney disease, stage 3 unspecified: Secondary | ICD-10-CM

## 2017-12-02 HISTORY — DX: Chronic kidney disease, stage 2 (mild): N18.2

## 2017-12-02 LAB — POCT GLYCOSYLATED HEMOGLOBIN (HGB A1C): HEMOGLOBIN A1C: 7.8 % — AB (ref 4.0–5.6)

## 2017-12-02 LAB — GLUCOSE, CAPILLARY: GLUCOSE-CAPILLARY: 175 mg/dL — AB (ref 70–99)

## 2017-12-02 MED ORDER — AMLODIPINE BESYLATE 10 MG PO TABS: 10.0000 mg | ORAL_TABLET | Freq: Every day | ORAL | 3 refills | Status: DC

## 2017-12-02 MED ORDER — INSULIN LISPRO 100 UNIT/ML (KWIKPEN): 3.0000 [IU] | PEN_INJECTOR | Freq: Three times a day (TID) | SUBCUTANEOUS | 11 refills | Status: DC

## 2017-12-02 MED ORDER — CLONIDINE HCL 0.3 MG PO TABS: 0.3000 mg | ORAL_TABLET | Freq: Two times a day (BID) | ORAL | 3 refills | Status: DC

## 2017-12-02 MED ORDER — BASAGLAR KWIKPEN 100 UNIT/ML ~~LOC~~ SOPN: 25.0000 [IU] | PEN_INJECTOR | Freq: Every day | SUBCUTANEOUS | 3 refills | Status: DC

## 2017-12-02 NOTE — Progress Notes (Signed)
   CC: Routine visit for HTN, DMII and elevated creatinine   HPI: [name] is a 57 y.o. female who presents today for a routine evaluation of her HTN, DMII and elevated serum creatinine.   HLD: Patient stated that she was prescribed simvastatin by Dr. Quay Burow as well as the Atorvastatin here. I asked her to stop one of the medications and she chose to discontinue the simvastatin.  Continue atorvastatin 40mg  daily.   HTN: Persistently Hypertensive during clinic visits. However, review of her home recorded BP's indicates that she typically runs a systolic of 496-759. As such, we will have her keep a record of BP BID and upon her return in three months for assessment. As I wish to avoid hypotension (noted on a few of her home BP recordings) we will monitor and continue her current medications.   Plan: Continue Carvedilol 25mg  BID Continue HCTZ 25mg  BID Continue Amlodipine 10mg  daily BMP unremarkable with stable electrolytes, potassium slightly below normal.  BMP Latest Ref Rng & Units 12/02/2017 09/09/2017 07/30/2017  Glucose 65 - 99 mg/dL 158(H) 118(H) 133(H)  BUN 6 - 24 mg/dL 25(H) 24 29(H)  Creatinine 0.57 - 1.00 mg/dL 1.22(H) 1.55(H) 1.47(H)  BUN/Creat Ratio 9 - 23 20 15 20   Sodium 134 - 144 mmol/L 139 139 139  Potassium 3.5 - 5.2 mmol/L 3.4(L) 3.6 3.9  Chloride 96 - 106 mmol/L 97 100 97  CO2 20 - 29 mmol/L 24 23 24   Calcium 8.7 - 10.2 mg/dL 9.9 9.5 9.7   DMII: A1c% improved to 7.8 today. The patient has lost a notable amount of weight over the past 8 months. I feel this has contributed the most to her weight improved A1c.  Continue dietary modifications and exercise.  Continue Sitagliptin-metformin 50-1000mg  BID WC  CKDIIIa: Continue to control DMII and HTN. Plan: BMP with improved Serum Creatinine to 1.22, GFR 58. We will continue to track this, control her BP  Will mail patients labs, this is excellent news.   Past Medical History:  Diagnosis Date  . DIABETES  MELLITUS, TYPE II 06/02/2006   Last HBA1C 8.7.   . HYPERLIPIDEMIA 06/02/2006   Qualifier: Diagnosis of  By: Patrice Paradise MD, Roderic Palau    . HYPERTENSION 06/02/2006   Filed Vitals:   01/21/11 1134  BP: 162/98  Pulse: 80  Temp: 97.4 F (36.3 C)  TempSrc: Oral  Resp: 20  Height: 5\' 8"  (1.727 m)  Weight: 261 lb (118.389 kg)      Review of Systems:  ROS negative except as per HPI.  Physical Exam:  Vitals:   12/02/17 1531  BP: (!) 170/80  Pulse: 76  SpO2: 100%  Weight: 243 lb 9.6 oz (110.5 kg)   Physical Exam  Constitutional: She is oriented to person, place, and time. She appears well-nourished. No distress.  HENT:  Head: Normocephalic and atraumatic.  Cardiovascular: Normal rate and regular rhythm.  No murmur heard. Pulmonary/Chest: Effort normal and breath sounds normal. No stridor. No respiratory distress.  Abdominal: Soft. Bowel sounds are normal. She exhibits no distension. There is no tenderness.  Musculoskeletal: She exhibits no edema or tenderness.  Neurological: She is alert and oriented to person, place, and time.    Assessment & Plan:   See Encounters Tab for problem based charting.  Patient discussed with Dr. Beryle Beams

## 2017-12-02 NOTE — Patient Instructions (Signed)
FOLLOW-UP INSTRUCTIONS When: three months For: Routine visit What to bring: All of your medications  Thank you for your visit to the [facility] today.  Congratulation on the weight loss and reduction in your A1c%. This is a [employer]. Keep up the good work.  I have provided refills for everything that appeared to be running low. If you need any additional refills, please let us know.

## 2017-12-03 LAB — BMP8+ANION GAP
ANION GAP: 18 mmol/L (ref 10.0–18.0)
BUN/Creatinine Ratio: 20 (ref 9–23)
BUN: 25 mg/dL — ABNORMAL HIGH (ref 6–24)
CALCIUM: 9.9 mg/dL (ref 8.7–10.2)
CO2: 24 mmol/L (ref 20–29)
CREATININE: 1.22 mg/dL — AB (ref 0.57–1.00)
Chloride: 97 mmol/L (ref 96–106)
GFR calc Af Amer: 58 mL/min/{1.73_m2} — ABNORMAL LOW (ref >59)
GFR, EST NON AFRICAN AMERICAN: 50 mL/min/{1.73_m2} — AB (ref >59)
Glucose: 158 mg/dL — ABNORMAL HIGH (ref 65–99)
POTASSIUM: 3.4 mmol/L — AB (ref 3.5–5.2)
Sodium: 139 mmol/L (ref 134–144)

## 2017-12-04 ENCOUNTER — Encounter: Payer: Self-pay | Admitting: Internal Medicine

## 2017-12-04 NOTE — Assessment & Plan Note (Signed)
CKDIIIa: Continue to control DMII and HTN. Plan: BMP with improved Serum Creatinine to 1.22, GFR 58. We will continue to track this, control her BP  Will mail patients labs, this is excellent news.

## 2017-12-04 NOTE — Assessment & Plan Note (Signed)
  HTN: Persistently Hypertensive during clinic visits. However, review of her home recorded BP's indicates that she typically runs a systolic of 203-559. As such, we will have her keep a record of BP BID and upon her return in three months for assessment. As I wish to avoid hypotension (noted on a few of her home BP recordings) we will monitor and continue her current medications.   Plan: Continue Carvedilol 25mg  BID Continue HCTZ 25mg  BID Continue Amlodipine 10mg  daily BMP unremarkable with stable electrolytes, potassium slightly below normal.  BMP Latest Ref Rng & Units 12/02/2017 09/09/2017 07/30/2017  Glucose 65 - 99 mg/dL 158(H) 118(H) 133(H)  BUN 6 - 24 mg/dL 25(H) 24 29(H)  Creatinine 0.57 - 1.00 mg/dL 1.22(H) 1.55(H) 1.47(H)  BUN/Creat Ratio 9 - 23 20 15 20   Sodium 134 - 144 mmol/L 139 139 139  Potassium 3.5 - 5.2 mmol/L 3.4(L) 3.6 3.9  Chloride 96 - 106 mmol/L 97 100 97  CO2 20 - 29 mmol/L 24 23 24   Calcium 8.7 - 10.2 mg/dL 9.9 9.5 9.7

## 2017-12-04 NOTE — Progress Notes (Signed)
Medicine attending: Medical history, presenting problems, physical findings, and medications, reviewed with resident physician Dr Lawrence Harbrecht on the day of the patient visit and I concur with his evaluation and management plan. 

## 2017-12-04 NOTE — Assessment & Plan Note (Signed)
DMII: A1c% improved to 7.8 today. The patient has lost a notable amount of weight over the past 8 months. I feel this has contributed the most to her weight improved A1c.  Continue dietary modifications and exercise.  Continue Sitagliptin-metformin 50-1000mg  BID WC

## 2017-12-04 NOTE — Assessment & Plan Note (Signed)
HLD: Patient stated that she was prescribed simvastatin by Dr. Quay Burow as well as the Atorvastatin here. I asked her to stop one of the medications and she chose to discontinue the simvastatin.  Continue atorvastatin 40mg  daily.

## 2017-12-10 ENCOUNTER — Other Ambulatory Visit: Payer: Self-pay | Admitting: Internal Medicine

## 2017-12-10 DIAGNOSIS — I10 Essential (primary) hypertension: Secondary | ICD-10-CM

## 2017-12-10 NOTE — Telephone Encounter (Signed)
Needs refill on cloNIDine (CATAPRES) 0.3 MG tablet at [facility]  ;pt contact [phone]

## 2017-12-10 NOTE — Telephone Encounter (Signed)
Called pt, got vmail, lm for rtc. Called [facility], they state that pt had requested a refill of clonidine from them. Informed donna at [facility] that script was sent to [facility] 9/18. Will await call from pt to clarify which pharm she wants to use

## 2017-12-11 ENCOUNTER — Other Ambulatory Visit: Payer: Self-pay | Admitting: *Deleted

## 2017-12-11 DIAGNOSIS — I10 Essential (primary) hypertension: Secondary | ICD-10-CM

## 2017-12-11 MED ORDER — CLONIDINE HCL 0.3 MG PO TABS: 0.3000 mg | ORAL_TABLET | Freq: Two times a day (BID) | ORAL | 3 refills | Status: DC

## 2017-12-11 NOTE — Telephone Encounter (Signed)
Called confirmed rec'd script at [facility]

## 2017-12-15 ENCOUNTER — Other Ambulatory Visit: Payer: Self-pay | Admitting: Internal Medicine

## 2017-12-15 ENCOUNTER — Telehealth: Payer: Self-pay | Admitting: *Deleted

## 2017-12-15 DIAGNOSIS — I10 Essential (primary) hypertension: Secondary | ICD-10-CM

## 2017-12-15 MED ORDER — CLONIDINE HCL 0.3 MG PO TABS: 0.3000 mg | ORAL_TABLET | Freq: Two times a day (BID) | ORAL | 3 refills | Status: DC

## 2017-12-15 NOTE — Telephone Encounter (Signed)
Received call from Helene Kelp at [facility] stating she was concerned that patient was receiving clonidine from them as well as [facility]. States patient's daughter picked up 60 tabs clonidine today. Verified with Colletta Maryland at [facility] that 120 tabs clonidine was sent to patient on 09/21/2017 and 180 tabs was shipped out today. Returned call to [place] at [facility] to make her aware. Will route to PCP. Hubbard Hartshorn, RN, BSN

## 2017-12-16 NOTE — Telephone Encounter (Signed)
I spoke with Ashley Long at [facility]. I am not concerned about the current regimen. There appears to be no actual issue here and the patient appears to have done everything appropriately. She was shorted by [facility] and needed medication while awaiting that refill. She now has a four month supply which is not inappropriate. Will continue to follow.   Dr. Berline Lopes

## 2018-01-07 ENCOUNTER — Telehealth: Payer: Self-pay | Admitting: Dietician

## 2018-01-07 NOTE — Telephone Encounter (Signed)
Called patient to provide requested feedback on her blood sugar log.  Left her a message encouraging her to continue her good care of her well managed diabetes. Her weight has decreased from 255# to 243# over the past 9 months and her a1C dropped from 9.1% to 8.2% and now 7.8%.

## 2018-01-12 ENCOUNTER — Telehealth: Payer: Self-pay | Admitting: Dietician

## 2018-01-12 NOTE — Telephone Encounter (Signed)
Thank you [name]. I agree. We may need to discontinue her insulin altogether for now given her strict dietary changes. Please keep me updated.  Kathi Ludwig, MD Internal Medicine PGY-2

## 2018-01-12 NOTE — Telephone Encounter (Signed)
Ashley Long calls saying she is having several hypoglycemia episodes per week before lunch and after dinner. She is not needing much correction insulin and this is mostly when she takes her base food dose of 5-4 and 5. She is still trying to decrease her wight by limiting food portions and eating more vegetables. She reports her fasting blood sugars are mostly in target.  Plan: We discussed decreasing her food amount of insulin to 4 units with each meal and call back in about 1 week to let us know if her sugar is still too low or too high.  She verbalized understanding.  [name], RD 01/12/2018 12:28 PM.

## 2018-01-13 ENCOUNTER — Encounter: Payer: Self-pay | Admitting: Dietician

## 2018-01-13 ENCOUNTER — Emergency Department (HOSPITAL_COMMUNITY)
Admission: EM | Admit: 2018-01-13 | Discharge: 2018-01-13 | Disposition: A | Discharge status: Home / Self Care | Payer: No Typology Code available for payment source | Attending: Emergency Medicine | Admitting: Emergency Medicine

## 2018-01-13 ENCOUNTER — Telehealth: Payer: Self-pay | Admitting: Dietician

## 2018-01-13 ENCOUNTER — Encounter (HOSPITAL_COMMUNITY): Payer: Self-pay | Admitting: Emergency Medicine

## 2018-01-13 DIAGNOSIS — Z79899 Other long term (current) drug therapy: Secondary | ICD-10-CM | POA: Insufficient documentation

## 2018-01-13 DIAGNOSIS — E1122 Type 2 diabetes mellitus with diabetic chronic kidney disease: Secondary | ICD-10-CM | POA: Insufficient documentation

## 2018-01-13 DIAGNOSIS — Z7982 Long term (current) use of aspirin: Secondary | ICD-10-CM | POA: Insufficient documentation

## 2018-01-13 DIAGNOSIS — I129 Hypertensive chronic kidney disease with stage 1 through stage 4 chronic kidney disease, or unspecified chronic kidney disease: Secondary | ICD-10-CM | POA: Insufficient documentation

## 2018-01-13 DIAGNOSIS — B349 Viral infection, unspecified: Secondary | ICD-10-CM | POA: Insufficient documentation

## 2018-01-13 DIAGNOSIS — R197 Diarrhea, unspecified: Secondary | ICD-10-CM | POA: Insufficient documentation

## 2018-01-13 DIAGNOSIS — N182 Chronic kidney disease, stage 2 (mild): Secondary | ICD-10-CM | POA: Insufficient documentation

## 2018-01-13 DIAGNOSIS — Z794 Long term (current) use of insulin: Secondary | ICD-10-CM | POA: Insufficient documentation

## 2018-01-13 DIAGNOSIS — Z87891 Personal history of nicotine dependence: Secondary | ICD-10-CM | POA: Insufficient documentation

## 2018-01-13 LAB — CBG MONITORING, ED: GLUCOSE-CAPILLARY: 107 mg/dL — AB (ref 70–99)

## 2018-01-13 LAB — COMPREHENSIVE METABOLIC PANEL
ALBUMIN: 3.8 g/dL (ref 3.5–5.0)
ALT: 24 U/L (ref 0–44)
AST: 26 U/L (ref 15–41)
Alkaline Phosphatase: 79 U/L (ref 38–126)
Anion gap: 11 (ref 5–15)
BUN: 26 mg/dL — AB (ref 6–20)
CHLORIDE: 100 mmol/L (ref 98–111)
CO2: 26 mmol/L (ref 22–32)
Calcium: 10 mg/dL (ref 8.9–10.3)
Creatinine, Ser: 1.43 mg/dL — ABNORMAL HIGH (ref 0.44–1.00)
GFR calc Af Amer: 47 mL/min — ABNORMAL LOW (ref >60)
GFR calc non Af Amer: 40 mL/min — ABNORMAL LOW (ref >60)
GLUCOSE: 110 mg/dL — AB (ref 70–99)
POTASSIUM: 3.6 mmol/L (ref 3.5–5.1)
Sodium: 137 mmol/L (ref 135–145)
Total Bilirubin: 0.6 mg/dL (ref 0.3–1.2)
Total Protein: 7.1 g/dL (ref 6.5–8.1)

## 2018-01-13 LAB — URINALYSIS, ROUTINE W REFLEX MICROSCOPIC
Bilirubin Urine: NEGATIVE
GLUCOSE, UA: NEGATIVE mg/dL
Hgb urine dipstick: NEGATIVE
KETONES UR: NEGATIVE mg/dL
NITRITE: NEGATIVE
PROTEIN: 30 mg/dL — AB
Specific Gravity, Urine: 1.023 (ref 1.005–1.030)
pH: 5 (ref 5.0–8.0)

## 2018-01-13 LAB — CBC
HEMATOCRIT: 37.3 % (ref 36.0–46.0)
HEMOGLOBIN: 11.9 g/dL — AB (ref 12.0–15.0)
MCH: 28.4 pg (ref 26.0–34.0)
MCHC: 31.9 g/dL (ref 30.0–36.0)
MCV: 89 fL (ref 80.0–100.0)
Platelets: 261 10*3/uL (ref 150–400)
RBC: 4.19 MIL/uL (ref 3.87–5.11)
RDW: 13.1 % (ref 11.5–15.5)
WBC: 7.7 10*3/uL (ref 4.0–10.5)
nRBC: 0 % (ref 0.0–0.2)

## 2018-01-13 LAB — I-STAT BETA HCG BLOOD, ED (MC, WL, AP ONLY): I-stat hCG, quantitative: <5 m[IU]/mL (ref <5)

## 2018-01-13 LAB — LIPASE, BLOOD: LIPASE: 33 U/L (ref 11–51)

## 2018-01-13 NOTE — Telephone Encounter (Signed)
Ashley Long called with concern of lower blood sugars today: She took  The reduced amount of Humalog with dinner last night (4 units), Her blood sugar was 105 this am. She again took the reduced amount of Humalog for breakfast ( 4 units) and her blood sugar was  77 before lunch. She did not have symptoms but was concerned about taking insulin so she drank juice and rechecked it with result of 74,a second recheck result was 80 before lunch, took 3 units Humalog and ate. It was 75 after eating,drank more juice, it went up to 147 at 145 pm. She asked if she should take 3 units Humalog with dinner tonight.  Sh reports No change in activity, food intake except trying to eat more fruits and vegetables and water due to constipation. She has not weighed herself since her last visit.  Blood pressure has been pretty good. No losartan in about 4 months.  P: ok to reduce her dinner dose of insulin tonight and resume usual 4 units with meals tomorrow if blood sugars are higher. She may need a reduced basal amount if low blood sugars continue. CGM could be helpful. Encouraged prunes x2-3 and psyllium husk daily for fiber supplement, plenty of water.  Scheduled an appointment next Wednesday before she sees Dr. Berline Lopes. [name], RD 01/13/2018 4:35 PM.

## 2018-01-13 NOTE — ED Triage Notes (Signed)
Pt presents with multiple complaints since last week; pt states come down with a cold, then her sugars were running low, and then she got constipated, then she had diarrhea

## 2018-01-13 NOTE — ED Provider Notes (Signed)
[place] EMERGENCY DEPARTMENT Provider Note   CSN: [id number] Arrival date & time: 01/13/18  1936     History   Chief Complaint Chief Complaint  Patient presents with  . Hypoglycemia  . Constipation  . Abdominal Pain  . Diarrhea    HPI Ashley Long is a 57 y.o. female.  Patient is a 57 year old female with past medical history of diabetes and hypertension.  She presents today for evaluation of multiple complaints.  She reports a one-week history of upper respiratory-like symptoms, low blood sugars, and GI symptoms.  She reports feeling as if she was constipated, then developed diarrhea.  She denies any fevers or chills.  She denies any significant abdominal discomfort.  She denies any ill contacts.  She denies any aggravating or alleviating factors.  The history is provided by the patient.    Past Medical History:  Diagnosis Date  . DIABETES MELLITUS, TYPE II 06/02/2006   Last HBA1C 8.7.   . HYPERLIPIDEMIA 06/02/2006   Qualifier: Diagnosis of  By: Patrice Paradise MD, Roderic Palau    . HYPERTENSION 06/02/2006   Filed Vitals:   01/21/11 1134  BP: 162/98  Pulse: 80  Temp: 97.4 F (36.3 C)  TempSrc: Oral  Resp: 20  Height: '5\' 8"'  (1.727 m)  Weight: 261 lb (118.389 kg)       Patient Active Problem List   Diagnosis Date Noted  . Stage 2 chronic kidney disease 12/02/2017  . AKI (acute kidney injury) ([place]) 07/17/2017  . Sebaceous cyst 11/26/2016  . Perimenopausal 08/15/2016  . Constipation 06/14/2015  . Diabetes mellitus without complication ([place]) 03/54/6568  . Preventive measure 04/28/2011  . Hyperlipidemia 06/02/2006  . Essential hypertension 06/02/2006    Past Surgical History:  Procedure Laterality Date  . COLONOSCOPY N/A 03/29/2013   Procedure: COLONOSCOPY;  Surgeon: Lear Ng, MD;  Location: [facility] ENDOSCOPY;  Service: Endoscopy;  Laterality: N/A;  . TUBAL LIGATION       OB History    Gravida  1   Para  1   Term  1   Preterm      AB      Living  1     SAB      TAB      Ectopic      Multiple      Live Births               Home Medications    Prior to Admission medications   Medication Sig Start Date End Date Taking? Authorizing Provider  amLODipine (NORVASC) 10 MG tablet Take 1 tablet (10 mg total) by mouth at bedtime. 12/02/17   Kathi Ludwig, MD  aspirin 81 MG EC tablet Take 1 tablet (81 mg total) by mouth daily. 06/03/17   Kathi Ludwig, MD  atorvastatin (LIPITOR) 40 MG tablet Take 1 tablet (40 mg total) by mouth daily. 06/03/17   Kathi Ludwig, MD  Blood Glucose Monitoring Suppl (WAVESENSE PRESTO) W/DEVICE KIT 1 each by Does not apply route 2 (two) times daily. 10/17/10   Janell Quiet, MD  carvedilol (COREG) 25 MG tablet Take 1 tablet (25 mg total) by mouth 2 (two) times daily. 06/03/17   Kathi Ludwig, MD  cloNIDine (CATAPRES) 0.3 MG tablet Take 1 tablet (0.3 mg total) by mouth 2 (two) times daily. 12/15/17   Kathi Ludwig, MD  glucose blood (WAVESENSE PRESTO) test strip Use to test blood sugar 3 times daily. diag code E11.9. Insulin dependent 02/06/16   Burns, Arloa Koh,  MD  hydrochlorothiazide (HYDRODIURIL) 25 MG tablet TAKE 1 Tablet BY MOUTH ONCE DAILY 08/12/17   Kathi Ludwig, MD  Insulin Glargine (BASAGLAR KWIKPEN) 100 UNIT/ML SOPN Inject 0.25 mLs (25 Units total) into the skin at bedtime. 12/02/17   Kathi Ludwig, MD  insulin lispro (HUMALOG) 100 UNIT/ML KiwkPen Inject 0.03-0.08 mLs (3-8 Units total) into the skin 3 (three) times daily before meals. 12/02/17   Kathi Ludwig, MD  Insulin Pen Needle (PEN NEEDLES) 31G X 5 MM MISC 4 Doses/Fill by Does not apply route daily. 03/26/17   Forde Dandy, PharmD  Insulin Syringe-Needle U-100 31G X 15/64" 0.3 ML MISC 1 each by Does not apply route 2 (two) times daily with a meal. 05/16/15   Burns, Arloa Koh, MD  Ketotifen Fumarate (ALAWAY OP) Place 1 drop into both eyes daily as needed (dry eyes/itching).    [provider]    Lancets 30G MISC 1 each by Does not apply route 2 (two) times daily. 10/17/10   Janell Quiet, MD  Multiple Vitamin (MULTIVITAMIN WITH MINERALS) TABS tablet Take 1 tablet by mouth daily. Centrum for Women    [provider]  pantoprazole (PROTONIX) 40 MG tablet Take 1 tablet (40 mg total) by mouth daily. 06/23/17   Velna Ochs, MD  polyethylene glycol powder (MIRALAX) powder Take 17 g by mouth daily. 03/05/17   Kathi Ludwig, MD  sitaGLIPtin-metformin (JANUMET) 50-1000 MG tablet Take 1 tablet by mouth 2 (two) times daily with a meal. 06/03/17   Kathi Ludwig, MD    Family History Family History  Problem Relation Age of Onset  . Other Neg Hx     Social History Social History   Tobacco Use  . Smoking status: Former Smoker    Last attempt to quit: 10/15/1982    Years since quitting: 35.2  . Smokeless tobacco: Never Used  Substance Use Topics  . Alcohol use: No    Alcohol/week: 0.0 standard drinks  . Drug use: No     Allergies   Patient has no known allergies.   Review of Systems Review of Systems  All other systems reviewed and are negative.    Physical Exam Updated Vital Signs BP (!) 184/93 (BP Location: Right Arm)   Pulse 82   Temp 98.9 F (37.2 C) (Oral)   Resp 20   LMP 12/25/2015 Comment: Spotting.  SpO2 99%   Physical Exam  Constitutional: She is oriented to person, place, and time. She appears well-developed and well-nourished. No distress.  HENT:  Head: Normocephalic and atraumatic.  Mouth/Throat: Oropharynx is clear and moist.  Neck: Normal range of motion. Neck supple.  Cardiovascular: Normal rate and regular rhythm. Exam reveals no gallop and no friction rub.  No murmur heard. Pulmonary/Chest: Effort normal and breath sounds normal. No respiratory distress. She has no wheezes.  Abdominal: Soft. Bowel sounds are normal. She exhibits no distension. There is tenderness in the suprapubic area. There is no rigidity, no rebound and no  guarding.  Musculoskeletal: Normal range of motion.  Neurological: She is alert and oriented to person, place, and time.  Skin: Skin is warm and dry. She is not diaphoretic.  Nursing note and vitals reviewed.    ED Treatments / Results  Labs (all labs ordered are listed, but only abnormal results are displayed) Labs Reviewed  COMPREHENSIVE METABOLIC PANEL - Abnormal; Notable for the following components:      Result Value   Glucose, Bld 110 (*)    BUN 26 (*)  Creatinine, Ser 1.43 (*)    GFR calc non Af Amer 40 (*)    GFR calc Af Amer 47 (*)    All other components within normal limits  CBC - Abnormal; Notable for the following components:   Hemoglobin 11.9 (*)    All other components within normal limits  URINALYSIS, ROUTINE W REFLEX MICROSCOPIC - Abnormal; Notable for the following components:   Protein, ur 30 (*)    Leukocytes, UA TRACE (*)    Bacteria, UA MANY (*)    All other components within normal limits  CBG MONITORING, ED - Abnormal; Notable for the following components:   Glucose-Capillary 107 (*)    All other components within normal limits  LIPASE, BLOOD  I-STAT BETA HCG BLOOD, ED ([facility], [facility], [facility] ONLY)  CBG MONITORING, ED    EKG None  Radiology No results found.  Procedures Procedures (including critical care time)  Medications Ordered in ED Medications - No data to display   Initial Impression / Assessment and Plan / ED Course  I have reviewed the triage vital signs and the nursing notes.  Pertinent labs & imaging results that were available during my care of the patient were reviewed by me and considered in my medical decision making (see chart for details).  Patient presenting with multiple complaints as described in the HPI.  I suspect a viral etiology.  She started out feeling as though she was constipated, then had a bowel movement and noticed a small amount of blood after wiping, however this has not recurred.  Her hemoglobin is stable, she has  no white count, and urine and remainder laboratory studies are unremarkable.  Her abdominal exam is basically benign and I do not feel as though any further work-up is indicated at this time.  As stated before, I suspect a viral etiology.  I will advise her to treat the symptoms, then return as needed for any problems.  Final Clinical Impressions(s) / ED Diagnoses   Final diagnoses:  None    ED Discharge Orders    None       Veryl Speak, MD 01/13/18 2339

## 2018-01-13 NOTE — Discharge Instructions (Signed)
You may use over-the-counter medications as needed for symptomatic relief.  Return to the emergency department if you develop severe pain, high fevers, worsening bleeding, or other new and concerning symptoms.

## 2018-01-14 NOTE — Telephone Encounter (Signed)
Ashley Long, given her symptoms I would recommend that she stop the insulin until we meet in November next week.  Kathi Ludwig, MD

## 2018-01-18 ENCOUNTER — Telehealth: Payer: Self-pay | Admitting: Dietician

## 2018-01-18 NOTE — Telephone Encounter (Signed)
Using 4 units mealtime insulin and adding correction dose if needed not having any hypoglycemia, 116 is the lowest blood sugar she has had in the past week. Asking if she can come to her appoints on Wednesday even though her orange card expired. I suggested she speak to our financial counselor.

## 2018-01-19 ENCOUNTER — Telehealth: Payer: Self-pay | Admitting: Dietician

## 2018-01-19 NOTE — Telephone Encounter (Signed)
Returned patient call about wanting to keep her appointments tomorrow. Left message per front office that she should come and bring her documentation for [facility] and [place] .

## 2018-01-20 ENCOUNTER — Ambulatory Visit: Payer: No Typology Code available for payment source

## 2018-01-20 ENCOUNTER — Ambulatory Visit: Payer: No Typology Code available for payment source | Admitting: Dietician

## 2018-01-20 ENCOUNTER — Ambulatory Visit (INDEPENDENT_AMBULATORY_CARE_PROVIDER_SITE_OTHER): Payer: No Typology Code available for payment source | Admitting: Dietician

## 2018-01-20 ENCOUNTER — Encounter: Payer: No Typology Code available for payment source | Admitting: Internal Medicine

## 2018-01-20 ENCOUNTER — Ambulatory Visit (INDEPENDENT_AMBULATORY_CARE_PROVIDER_SITE_OTHER): Payer: Self-pay | Admitting: Internal Medicine

## 2018-01-20 ENCOUNTER — Encounter: Payer: Self-pay | Admitting: Internal Medicine

## 2018-01-20 VITALS — BP 148/82 | HR 80 | Temp 98.0°F | Wt 241.7 lb

## 2018-01-20 DIAGNOSIS — Z87448 Personal history of other diseases of urinary system: Secondary | ICD-10-CM

## 2018-01-20 DIAGNOSIS — K648 Other hemorrhoids: Secondary | ICD-10-CM

## 2018-01-20 DIAGNOSIS — E119 Type 2 diabetes mellitus without complications: Secondary | ICD-10-CM

## 2018-01-20 DIAGNOSIS — I10 Essential (primary) hypertension: Secondary | ICD-10-CM

## 2018-01-20 DIAGNOSIS — Z79899 Other long term (current) drug therapy: Secondary | ICD-10-CM

## 2018-01-20 DIAGNOSIS — N179 Acute kidney failure, unspecified: Secondary | ICD-10-CM

## 2018-01-20 DIAGNOSIS — E1122 Type 2 diabetes mellitus with diabetic chronic kidney disease: Secondary | ICD-10-CM

## 2018-01-20 DIAGNOSIS — Z6836 Body mass index (BMI) 36.0-36.9, adult: Secondary | ICD-10-CM

## 2018-01-20 DIAGNOSIS — Z713 Dietary counseling and surveillance: Secondary | ICD-10-CM

## 2018-01-20 DIAGNOSIS — Z794 Long term (current) use of insulin: Secondary | ICD-10-CM

## 2018-01-20 DIAGNOSIS — I129 Hypertensive chronic kidney disease with stage 1 through stage 4 chronic kidney disease, or unspecified chronic kidney disease: Secondary | ICD-10-CM

## 2018-01-20 DIAGNOSIS — N182 Chronic kidney disease, stage 2 (mild): Secondary | ICD-10-CM

## 2018-01-20 DIAGNOSIS — K5900 Constipation, unspecified: Secondary | ICD-10-CM

## 2018-01-20 DIAGNOSIS — R801 Persistent proteinuria, unspecified: Secondary | ICD-10-CM

## 2018-01-20 DIAGNOSIS — E1129 Type 2 diabetes mellitus with other diabetic kidney complication: Secondary | ICD-10-CM

## 2018-01-20 NOTE — Patient Instructions (Signed)
FOLLOW-UP INSTRUCTIONS When: At your visit next month For: Routine What to bring: All of your medications  I have have not made any changes to your medications today.   Today we discussed your constipation. Please increase your dietary fiber content and take a supplement such as metamucil. In addition due to your history of internal hemorrhoids please take warm water baths (sitz bath) and use topical creams like preparation H. If your symptoms due not resolve please let me know.  Thank you for your visit to the [facility] today. If you have any questions or concerns please call us at [phone].

## 2018-01-20 NOTE — Patient Instructions (Addendum)
Use 1-2 tablespoons of psyllium husk (can buy [employer] brand) stirred in 8 oz water each evening.  Chia seeds can also help with constipation. Can throw them in yogurt or smoothie or oatmeal or make chia seed pudding.   Follow up as needed.

## 2018-01-20 NOTE — Progress Notes (Signed)
  Medical Nutrition Therapy:  Appt start time: 1430 end time:  0677. Visit # 7  Assessment:  Primary concerns today: support for basal bolus regimen This is a breif follow up for Ashley Long who is doing well on her new insulin regimen;  She is taking: 24 units basaglar daily and 4-6 units of Humalog three times a day before meals. It appears from her records that 3 units with meal is insufficient as her blood sugar before the following meal is consistently in the 200s.     Goals for the switch to basal bolus therapy are: improved blood sugars, less frequent hypoglycemia and more flexibility. She has achieved all 3 goals.  She reports she has successfully added more fruits, vegetables, nuts tp her diet.   Target established with patient today as 90-150 with target for correction 100. Activity- walks on her job and in activities of daily living.   ANTHROPOMETRICS: weight stable  MEDICATIONS: TDD 40 , ICR 1:13, correction factor ~ 45 mg/dl, 60% basal, 40% bolus BLOOD SUGAR: mostly in 100s, checking before meals and 2-3x/week at bedtime  Progress Towards Goal(s):  Some progress.   Nutritional Diagnosis:  -2.3 Food-medication interaction As related to insulin and blood sugar mismatch improved  As evidenced by improved blood sugars and her confidence in adjusting her insulin dose based on blood sugars and food intake.    Intervention:  Nutrition support for previous education. Education done on how to increase soluble fiber to relieve constipation.  Coordination of care: none Teaching Method Utilized: Visual, Auditory, Hands on Handouts given during visit include: AVS, insulin pen needles, info on buying insulin pen needles Barriers to learning/adherence to lifestyle change: none Demonstrated degree of understanding via:  Teach Back   Monitoring/Evaluation:  Dietary intake, exercise, meter & logbook, and body weight in 3 month(s). [name], RD 01/20/2018 3:12 PM.

## 2018-01-20 NOTE — Progress Notes (Signed)
   CC: Constipation  HPI:[name] is a 57 y.o. female who presents for evaluation of constipation. Please see individual problem based A/P for details.  Constipation: Patient presented to the ED on 01/13/2018 for abdominal pain. She was diagnosed with constipation. She stated that she feels much better but continues to have difficult and intermittent bowel movements. She noted a prior episode of similar events when she was diagnosed with internal hemorrhoids. Although I do not suspect that the internal hemorrhoids are the cause of her constipation I feel they may be a result of her constipation. We will treat both.  Plan: Recommended a Sitz bath and Preparation H due to her history of hemorrhoids and occasional blood streaked tissue paper Also recommended that she Increase her fiber content with vegetables and metamucil as well as maintaining her water intake to decrease her constipation burden.  Renal failure: Will call patient to have her return for labs only to better evaluate her renal injury. This appeared to have improved initially and was contributed to severe volume depletion due to GI losses from a GI illness in the setting of ARB use and NSAID use. It has not returned to baseline though and as such we feel it necessary to evaluate further given that her most recent Scr was again slightly elevated.   Plan: I have ordered a renal US due to her history of hydronephrosis I have ordered a repeat BMP I have ordered a urine microalbumin to better quantify the urinary protein loss.  HTN: 148/82 today. As per review of her home BP readings it appears that she averages near 138/88 at home over the past several months. Although above goal this is an improvement over prior averages.  Prior renin/aldo and cortisol normal while on four medications for HTN. Low score for OSA but will further evaluate at her next visit.  Plan: Continue Clonidine 0.3mg  BID Continue Amlodipine 10mg   daily Continue Carvedilol 25mg  BID Continue HCTZ 25mg  daily  Diabetes: Last A1c 7.8% down form 9.1% 10 months prior. She continues to closely monitor her dietary intake and utilizes her meal time insulin appropriately.   Plan:  Continue Glargine 25U QHS Continue TID meal time sliding scale Continue Janumet 50-1000mg  BID Appreciate Registered Angus Plyler's assistance with our patient.   Past Medical History:  Diagnosis Date  . DIABETES MELLITUS, TYPE II 06/02/2006   Last HBA1C 8.7.   . HYPERLIPIDEMIA 06/02/2006   Qualifier: Diagnosis of  By: Patrice Paradise MD, Roderic Palau    . HYPERTENSION 06/02/2006   Filed Vitals:   01/21/11 1134  BP: 162/98  Pulse: 80  Temp: 97.4 F (36.3 C)  TempSrc: Oral  Resp: 20  Height: 5\' 8"  (1.727 m)  Weight: 261 lb (118.389 kg)      Review of Systems:  ROS negative except as per HPI.  Physical Exam: Vitals:   01/20/18 1534  BP: (!) 148/82  Pulse: 80  Temp: 98 F (36.7 C)  TempSrc: Oral  SpO2: 99%  Weight: 241 lb 11.2 oz (109.6 kg)   General: A/O x4, in no acute distress, afebrile Cardio: RRR, no mrg's  Assessment & Plan:   See Encounters Tab for problem based charting.  Patient discussed with Dr. Lynnae January

## 2018-01-21 ENCOUNTER — Encounter: Payer: Self-pay | Admitting: Internal Medicine

## 2018-01-21 ENCOUNTER — Other Ambulatory Visit (INDEPENDENT_AMBULATORY_CARE_PROVIDER_SITE_OTHER): Payer: Self-pay

## 2018-01-21 DIAGNOSIS — R801 Persistent proteinuria, unspecified: Secondary | ICD-10-CM

## 2018-01-21 DIAGNOSIS — N179 Acute kidney failure, unspecified: Secondary | ICD-10-CM

## 2018-01-21 HISTORY — DX: Persistent proteinuria, unspecified: R80.1

## 2018-01-21 NOTE — Progress Notes (Signed)
Internal Medicine Clinic Attending  Case discussed with Dr. Harbrecht at the time of the visit.  We reviewed the resident's history and exam and pertinent patient test results.  I agree with the assessment, diagnosis, and plan of care documented in the resident's note.   

## 2018-01-21 NOTE — Assessment & Plan Note (Signed)
  Constipation: Patient presented to the ED on 01/13/2018 for abdominal pain. She was diagnosed with constipation. She stated that she feels much better but continues to have difficult and intermittent bowel movements. She noted a prior episode of similar events when she was diagnosed with internal hemorrhoids. Although I do not suspect that the internal hemorrhoids are the cause of her constipation I feel they may be a result of her constipation. We will treat both.  Plan: Recommended a Sitz bath and Preparation H due to her history of hemorrhoids and occasional blood streaked tissue paper Also recommended that she Increase her fiber content with vegetables and metamucil as well as maintaining her water intake to decrease her constipation burden.

## 2018-01-21 NOTE — Assessment & Plan Note (Signed)
Will obtain a microalbumin given her past history of proteinuria, diabetes and HTN.

## 2018-01-21 NOTE — Assessment & Plan Note (Signed)
Diabetes: Last A1c 7.8% down form 9.1% 10 months prior. She continues to closely monitor her dietary intake and utilizes her meal time insulin appropriately.   Plan:  Continue Glargine 25U QHS Continue TID meal time sliding scale Continue Janumet 50-1000mg  BID Appreciate Registered [place] Plyler's assistance with our patient.

## 2018-01-21 NOTE — Assessment & Plan Note (Signed)
See CKD from same date.

## 2018-01-21 NOTE — Assessment & Plan Note (Signed)
Renal failure: Will call patient to have her return for labs only to better evaluate her renal injury. This appeared to have improved initially and was contributed to severe volume depletion due to GI losses from a GI illness in the setting of ARB use and NSAID use. It has not returned to baseline though and as such we feel it necessary to evaluate further given that her most recent Scr was again slightly elevated.   Plan: I have ordered a renal US due to her history of hydronephrosis I have ordered a repeat BMP I have ordered a urine microalbumin to better quantify the urinary protein loss.

## 2018-01-21 NOTE — Assessment & Plan Note (Signed)
HTN: 148/82 today. As per review of her home BP readings it appears that she averages near 138/88 at home over the past several months. Although above goal this is an improvement over prior averages.  Prior renin/aldo and cortisol normal while on four medications for HTN. Low score for OSA but will further evaluate at her next visit.  Plan: Continue Clonidine 0.3mg  BID Continue Amlodipine 10mg  daily Continue Carvedilol 25mg  BID Continue HCTZ 25mg  daily

## 2018-01-22 LAB — BMP8+ANION GAP
ANION GAP: 19 mmol/L — AB (ref 10.0–18.0)
BUN/Creatinine Ratio: 18 (ref 9–23)
BUN: 27 mg/dL — ABNORMAL HIGH (ref 6–24)
CALCIUM: 9.5 mg/dL (ref 8.7–10.2)
CO2: 21 mmol/L (ref 20–29)
CREATININE: 1.54 mg/dL — AB (ref 0.57–1.00)
Chloride: 99 mmol/L (ref 96–106)
GFR calc Af Amer: 43 mL/min/{1.73_m2} — ABNORMAL LOW (ref >59)
GFR calc non Af Amer: 38 mL/min/{1.73_m2} — ABNORMAL LOW (ref >59)
Glucose: 148 mg/dL — ABNORMAL HIGH (ref 65–99)
POTASSIUM: 4.1 mmol/L (ref 3.5–5.2)
Sodium: 139 mmol/L (ref 134–144)

## 2018-01-22 LAB — MICROALBUMIN / CREATININE URINE RATIO
CREATININE, UR: 428.7 mg/dL
MICROALBUM., U, RANDOM: 142.2 ug/mL
Microalb/Creat Ratio: 33.2 mg/g creat — ABNORMAL HIGH (ref 0.0–30.0)

## 2018-02-05 ENCOUNTER — Ambulatory Visit (HOSPITAL_COMMUNITY)
Admission: RE | Admit: 2018-02-05 | Discharge: 2018-02-05 | Disposition: A | Discharge status: Home / Self Care | Payer: Self-pay | Source: Ambulatory Visit | Attending: Internal Medicine | Admitting: Internal Medicine

## 2018-02-05 DIAGNOSIS — N1330 Unspecified hydronephrosis: Secondary | ICD-10-CM | POA: Insufficient documentation

## 2018-02-05 DIAGNOSIS — N179 Acute kidney failure, unspecified: Secondary | ICD-10-CM | POA: Insufficient documentation

## 2018-02-08 ENCOUNTER — Ambulatory Visit: Payer: Self-pay

## 2018-03-03 ENCOUNTER — Encounter: Payer: Self-pay | Admitting: Dietician

## 2018-03-03 ENCOUNTER — Encounter: Payer: Self-pay | Admitting: Internal Medicine

## 2018-03-03 ENCOUNTER — Other Ambulatory Visit (HOSPITAL_COMMUNITY)
Admission: RE | Admit: 2018-03-03 | Discharge: 2018-03-03 | Disposition: A | Discharge status: Home / Self Care | Payer: Self-pay | Source: Ambulatory Visit | Attending: Oncology | Admitting: Oncology

## 2018-03-03 ENCOUNTER — Ambulatory Visit (INDEPENDENT_AMBULATORY_CARE_PROVIDER_SITE_OTHER): Payer: Self-pay | Admitting: Dietician

## 2018-03-03 ENCOUNTER — Ambulatory Visit (INDEPENDENT_AMBULATORY_CARE_PROVIDER_SITE_OTHER): Payer: Self-pay | Admitting: Internal Medicine

## 2018-03-03 VITALS — BP 188/111 | HR 77 | Wt 242.9 lb

## 2018-03-03 DIAGNOSIS — E1122 Type 2 diabetes mellitus with diabetic chronic kidney disease: Secondary | ICD-10-CM

## 2018-03-03 DIAGNOSIS — N1832 Chronic kidney disease, stage 3b: Secondary | ICD-10-CM | POA: Insufficient documentation

## 2018-03-03 DIAGNOSIS — Z794 Long term (current) use of insulin: Secondary | ICD-10-CM

## 2018-03-03 DIAGNOSIS — I10 Essential (primary) hypertension: Secondary | ICD-10-CM

## 2018-03-03 DIAGNOSIS — E119 Type 2 diabetes mellitus without complications: Secondary | ICD-10-CM

## 2018-03-03 DIAGNOSIS — Z124 Encounter for screening for malignant neoplasm of cervix: Secondary | ICD-10-CM | POA: Insufficient documentation

## 2018-03-03 DIAGNOSIS — Z79899 Other long term (current) drug therapy: Secondary | ICD-10-CM

## 2018-03-03 DIAGNOSIS — N183 Chronic kidney disease, stage 3 unspecified: Secondary | ICD-10-CM | POA: Insufficient documentation

## 2018-03-03 DIAGNOSIS — I129 Hypertensive chronic kidney disease with stage 1 through stage 4 chronic kidney disease, or unspecified chronic kidney disease: Secondary | ICD-10-CM

## 2018-03-03 LAB — POCT GLYCOSYLATED HEMOGLOBIN (HGB A1C): HEMOGLOBIN A1C: 7.7 % — AB (ref 4.0–5.6)

## 2018-03-03 LAB — GLUCOSE, CAPILLARY: Glucose-Capillary: 125 mg/dL — ABNORMAL HIGH (ref 70–99)

## 2018-03-03 MED ORDER — LOSARTAN POTASSIUM 25 MG PO TABS: 25.0000 mg | ORAL_TABLET | Freq: Every day | ORAL | 1 refills | Status: DC

## 2018-03-03 NOTE — Progress Notes (Addendum)
   CC: Routine visit for DMII, HTN,   HPI:[name] is a 57 y.o. female who presents for evaluation of DMII and HTN. Please see individual problem based A/P for details.  DMII: The patient continues to be keenly aware of her need for close adherence to a strict dietary regimen in order to diminish the risk of sequela from uncontrolled diabetes. She routinely monitors her blood glucose levels, eats a balanced diet low in high glycemic carbohydrates, is active, desires and succeeds in loosing weight. Close review of her TID glucose monitoring revealed an occasional low glucose near 60 but they are randomly symptomatic, respond well to minimal carbohydrate intake and have not clear correlation with activity, diet or insulin dosing. She was advised to keep a piece of candy or fruit near by and to adjust her short acting insulin down according to her glucometer in order to better accommodate for these lows.  Plan: A1c 7.7% Continue Glargine 25U QHS Continue TID SSI, based on diet and activity,  Continue Janumet 50-1000mg  BID  HTN: BP today 188/111. She notes that her pressure is much more well controlled at home with readings on her machine indicating 117/87 prior to leaving her house. When compared to our equipment here, she has readings of 903 systolic or greater w/in a few minutes of the recorded value with our machine. I feel she does indeed have a notable component of white coat hypertension but given her slightly worsening Scr I will resume an ARB and recheck her values at her upcoming appointment.   Plan:  Restart Losartan, at 25mg  daily Repeat BMP in 4-6 weeks with BP check only at that visit as well   Addendum: 30 day supply sent to [facility] until [facility] is able to provide. No appropriate alternative listed on [place] [facility] website.  CKDIII: Mild increase in microalbumin/creatinine ratio at 33.2, will continue current therapy and maximize blood  pressure control and glucose management. Will add ARB.  Need for cervical cancer screening: Patient opted to undergo Pap smear and testing today.  Pap smear completed with cytology sent and reflexed to high risk HPV types if ASCUS noted.   Past Medical History:  Diagnosis Date  . AKI (acute kidney injury) ([place]) 07/17/2017  . DIABETES MELLITUS, TYPE II 06/02/2006   Last HBA1C 8.7.   . HYPERLIPIDEMIA 06/02/2006   Qualifier: Diagnosis of  By: Patrice Paradise MD, Roderic Palau    . HYPERTENSION 06/02/2006   Filed Vitals:   01/21/11 1134  BP: 162/98  Pulse: 80  Temp: 97.4 F (36.3 C)  TempSrc: Oral  Resp: 20  Height: 5\' 8"  (1.727 m)  Weight: 261 lb (118.389 kg)      Review of Systems:  ROS negative except as per HPI.  Physical Exam: Vitals:   03/03/18 1501  BP: (!) 188/111  Pulse: 77  SpO2: 99%  Weight: 242 lb 14.4 oz (110.2 kg)   General: A/O x4, in no acute distress, afebrile, nondiaphoretic Cardio: RRR, no mrg's Pulmonary: CTA bilaterally GU/GI: Pap smear obtained without complication, there was no notable discoloration, erythema or deformity of the cervix noted which was incompletely viewed. The cervical os was observed prior to obtaining the sample.  MSK: BLE nontender, nonedematous  Assessment & Plan:   See Encounters Tab for problem based charting.  Patient seen with Dr. Evette Doffing

## 2018-03-03 NOTE — Assessment & Plan Note (Signed)
Need for cervical cancer screening: Patient opted to undergo Pap smear and testing today.  Pap smear completed with cytology sent and reflexed to high risk HPV types if ASCUS noted.

## 2018-03-03 NOTE — Assessment & Plan Note (Addendum)
HTN: BP today 188/111. She notes that her pressure is much more well controlled at home with readings on her machine indicating 117/87 prior to leaving her house. When compared to our equipment here, she has readings of 125 systolic or greater w/in a few minutes of the recorded value with our machine. I feel she does indeed have a notable component of white coat hypertension but given her slightly worsening Scr I will resume an ARB and recheck her values at her upcoming appointment.   Plan:  Restart Losartan, at 25mg  daily Repeat BMP in 4-6 weeks with BP check only at that visit as well   Addendum: 30 day supply sent to [facility] until [facility] is able to provide. No appropriate alternative listed on [place] [facility] website.

## 2018-03-03 NOTE — Patient Instructions (Signed)
FOLLOW-UP INSTRUCTIONS When: 3-4 months For: Routine visit What to bring: All of your medications  I have added Losartan 25mg  daily to your medications today.   Today we discussed your high blood pressure, diabetes and need for cervical cancer screening.   Please continue to closely monitor your diet and blood pressure at home. Your recorded values are very helpful in making any changes. Please continue the close dieting and activity. You have made several excellent changes and continue to do very well.   I will notify you of the results of any labs from today's evaluation when available to me. Please allow 1-2 weeks for the pap smear results.  Thank you for your visit to the [facility] today. If you have any questions or concerns please call us at [phone].

## 2018-03-03 NOTE — Assessment & Plan Note (Signed)
  CKDIII: Mild increase in microalbumin/creatinine ratio at 33.2, will continue current therapy and maximize blood pressure control and glucose management. Will add ARB. BMP pending.

## 2018-03-03 NOTE — Assessment & Plan Note (Signed)
  DMII: The patient continues to be keenly aware of her need for close adherence to a strict dietary regimen in order to diminish the risk of sequela from uncontrolled diabetes. She routinely monitors her blood glucose levels, eats a balanced diet low in high glycemic carbohydrates, is active, desires and succeeds in loosing weight. Close review of her TID glucose monitoring revealed an occasional low glucose near 60 but they are randomly symptomatic, respond well to minimal carbohydrate intake and have not clear correlation with activity, diet or insulin dosing. She was advised to keep a piece of candy or fruit near by and to adjust her short acting insulin down according to her glucometer in order to better accommodate for these lows.  Plan: A1c 7.7% Continue Glargine 25U QHS Continue TID SSI, based on diet and activity,  Continue Janumet 50-1000mg  BID

## 2018-03-03 NOTE — Assessment & Plan Note (Signed)
  CKDII: Mild increase in microalbumin/creatinine ratio at 33.2, will continue current therapy and maximize blood pressure control and glucose management. Will add ARB.

## 2018-03-03 NOTE — Progress Notes (Signed)
  Medical Nutrition Therapy:  Appt start time: 1520 end time:  5993. Visit # 7  Assessment:  Primary concerns today: support for basal bolus regimen This is a breif follow up for Ashley Long who is doing well on her new insulin regimen;  She is taking: 24 units basaglar daily and 4-6 units of Humalog three times a day before meals.  Goals for the switch to basal bolus therapy are: improved blood sugars, less frequent hypoglycemia and more flexibility. She has achieved all 3 goals.  She reports she has successfully added more fruits, vegetables, nuts to her diet.   Target established with patient today as 90-150 with target for correction 100. Activity- walks on her job and in activities of daily living.   ANTHROPOMETRICS: weight stable  MEDICATIONS: TDD ~40 , ICR 1:13, correction factor ~ 45 mg/dl, 60% basal, 40% bolus BLOOD SUGAR: average 149 mg/dL, 9% below 90 mg/dL, 270mg /dl, A1C decreased 0.1% Lab Results  Component Value Date   HGBA1C 7.7 (A) 03/03/2018    Progress Towards Goal(s):  Resolved.   Nutritional Diagnosis:  [place]-2.3 Food-medication interaction As related to insulin and blood sugar mismatch resolved  As evidenced by improved blood sugars and her confidence in adjusting her insulin dose based on blood sugars and food intake.    Intervention:  Nutrition support for previous education. Education done on how to increase soluble fiber to relieve constipation.  Coordination of care: consider more reliable basal insulin such as [place]. Teaching Method Utilized: Visual, Auditory, Hands on Handouts given during visit include: AVS, insulin pen needles, info on buying insulin pen needles Barriers to learning/adherence to lifestyle change: none Demonstrated degree of understanding via:  Teach Back   Monitoring/Evaluation:  Dietary intake, exercise, meter & logbook, and body weight in 3 month(s). Ashley Long, RD 03/03/2018 3:34 PM.

## 2018-03-04 LAB — BMP8+ANION GAP
ANION GAP: 22 mmol/L — AB (ref 10.0–18.0)
BUN/Creatinine Ratio: 22 (ref 9–23)
BUN: 37 mg/dL — AB (ref 6–24)
CALCIUM: 9.8 mg/dL (ref 8.7–10.2)
CO2: 20 mmol/L (ref 20–29)
Chloride: 100 mmol/L (ref 96–106)
Creatinine, Ser: 1.65 mg/dL — ABNORMAL HIGH (ref 0.57–1.00)
GFR calc Af Amer: 40 mL/min/{1.73_m2} — ABNORMAL LOW (ref >59)
GFR, EST NON AFRICAN AMERICAN: 35 mL/min/{1.73_m2} — AB (ref >59)
Glucose: 124 mg/dL — ABNORMAL HIGH (ref 65–99)
Potassium: 3.9 mmol/L (ref 3.5–5.2)
Sodium: 142 mmol/L (ref 134–144)

## 2018-03-04 NOTE — Progress Notes (Signed)
Internal Medicine Clinic Attending  I saw and evaluated the patient.  I personally confirmed the key portions of the history and exam documented by Dr. Harbrecht and I reviewed pertinent patient test results.  The assessment, diagnosis, and plan were formulated together and I agree with the documentation in the resident's note.  

## 2018-03-09 LAB — CYTOLOGY - PAP
DIAGNOSIS: NEGATIVE
HPV (WINDOPATH): NOT DETECTED

## 2018-03-14 NOTE — Addendum Note (Signed)
Addended by: Nicola Girt on: 03/14/2018 09:15 PM   Modules accepted: Level of Service

## 2018-03-18 ENCOUNTER — Telehealth: Payer: Self-pay | Admitting: *Deleted

## 2018-03-18 NOTE — Telephone Encounter (Addendum)
Call from pt - stated was wondering why she had not received Losartan from [facility] which was ordered on 12/18. She called and was told they do not have it and do not know when they will. Please send alternative rx. Thanks

## 2018-03-22 ENCOUNTER — Encounter: Payer: Self-pay | Admitting: Internal Medicine

## 2018-03-22 ENCOUNTER — Telehealth: Payer: Self-pay | Admitting: Internal Medicine

## 2018-03-22 MED ORDER — LOSARTAN POTASSIUM 25 MG PO TABS: 25.0000 mg | ORAL_TABLET | Freq: Every day | ORAL | 0 refills | Status: DC

## 2018-03-22 MED FILL — LOSARTAN POTASSIUM 25 MG TA: 25 | 30 days supply | Qty: 30 | Fill #0

## 2018-03-22 NOTE — Addendum Note (Signed)
Addended by: Nicola Girt on: 03/22/2018 09:27 AM   Modules accepted: Orders

## 2018-03-22 NOTE — Telephone Encounter (Signed)
Pt is returning Dr Berline Lopes called, pls contact ot [phone]

## 2018-03-22 NOTE — Telephone Encounter (Signed)
Stated she had talked to Dr Berline Lopes and has already picked up Losartan at [facility].

## 2018-03-22 NOTE — Telephone Encounter (Signed)
Losartan rx was sent to [facility] per Dr Berline Lopes - which they do have. Called pt - no answer; left message to call the office.

## 2018-03-22 NOTE — Telephone Encounter (Signed)
See telephone encounter from today 1/6.

## 2018-04-12 ENCOUNTER — Other Ambulatory Visit: Payer: Self-pay

## 2018-04-12 DIAGNOSIS — I10 Essential (primary) hypertension: Secondary | ICD-10-CM

## 2018-04-12 DIAGNOSIS — N183 Chronic kidney disease, stage 3 unspecified: Secondary | ICD-10-CM

## 2018-04-12 MED ORDER — LOSARTAN POTASSIUM 25 MG PO TABS: 25.0000 mg | ORAL_TABLET | Freq: Every day | ORAL | 2 refills | Status: DC

## 2018-04-12 NOTE — Telephone Encounter (Signed)
Requesting a refill on losartan (COZAAR) 25 MG tablet. Pt would like to speak with a nurse. Please call back.

## 2018-04-20 ENCOUNTER — Telehealth: Payer: Self-pay | Admitting: Dietician

## 2018-04-20 NOTE — Telephone Encounter (Signed)
Ashley Long wanted to know what a normal blood pressure should be. She says she gets 97/70s sometimes, but not very often. She feels fine at the time and has no symptoms.  We discussed continuing healthy lifestyle changes, checking blood pressure at home and taking medication as directed.

## 2018-05-03 ENCOUNTER — Telehealth: Payer: Self-pay | Admitting: *Deleted

## 2018-05-03 ENCOUNTER — Encounter: Payer: Self-pay | Admitting: Internal Medicine

## 2018-05-03 ENCOUNTER — Ambulatory Visit (INDEPENDENT_AMBULATORY_CARE_PROVIDER_SITE_OTHER): Payer: Self-pay | Admitting: Internal Medicine

## 2018-05-03 VITALS — BP 150/90 | HR 98 | Temp 98.6°F | Wt 240.0 lb

## 2018-05-03 DIAGNOSIS — E119 Type 2 diabetes mellitus without complications: Secondary | ICD-10-CM

## 2018-05-03 DIAGNOSIS — K529 Noninfective gastroenteritis and colitis, unspecified: Secondary | ICD-10-CM | POA: Insufficient documentation

## 2018-05-03 DIAGNOSIS — E1122 Type 2 diabetes mellitus with diabetic chronic kidney disease: Secondary | ICD-10-CM

## 2018-05-03 DIAGNOSIS — I129 Hypertensive chronic kidney disease with stage 1 through stage 4 chronic kidney disease, or unspecified chronic kidney disease: Secondary | ICD-10-CM

## 2018-05-03 DIAGNOSIS — Z794 Long term (current) use of insulin: Secondary | ICD-10-CM

## 2018-05-03 DIAGNOSIS — E785 Hyperlipidemia, unspecified: Secondary | ICD-10-CM

## 2018-05-03 DIAGNOSIS — N182 Chronic kidney disease, stage 2 (mild): Secondary | ICD-10-CM

## 2018-05-03 LAB — GLUCOSE, CAPILLARY: Glucose-Capillary: 106 mg/dL — ABNORMAL HIGH (ref 70–99)

## 2018-05-03 MED ORDER — ONDANSETRON HCL 4 MG PO TABS: 4.0000 mg | ORAL_TABLET | Freq: Every day | ORAL | 1 refills | Status: DC | PRN

## 2018-05-03 NOTE — Assessment & Plan Note (Signed)
Assessment/plan: Ashley Long has not been eating over the last several hours and is concerned about hypoglycemia with continued insulin use.  Capillary blood sugar today 106.  I have recommended that she decrease her Lantus to 20 units (normally 24 units at night).  She should also discontinue use of mealtime insulin while not eating.  I have instructed her to resume her normal diabetes regiment when she is able to tolerate a regular diet.

## 2018-05-03 NOTE — Assessment & Plan Note (Signed)
Assessment/plan: Ashley Long presents today with a 5-hour history of nausea, vomiting, and diarrhea.  I do believe that her symptoms are most consistent with a viral gastroenteritis.  Will provide symptom management with antiemetics and antidiarrheals.  I have sent in a prescription for Zofran and recommend OTC loperamide.

## 2018-05-03 NOTE — Patient Instructions (Signed)
While feeling sick: Take 20 units Lantus at night. Stop taking Humalog during the day until you can keep food.

## 2018-05-03 NOTE — Progress Notes (Signed)
   CC: Nausea and vomiting  HPI:  Ashley Long is a 58 y.o. female with type 2 diabetes, hyperlipidemia, hypertension, and chronic kidney disease stage II who presents with nausea and vomiting.  Ashley Long states that since 53 PM this afternoon (5 hours ago) she has had an abrupt onset of nausea, vomiting, and diarrhea.  She has not been able to eat anything throughout the day.  She denies any sick contacts.  She did eat fish at a restaurant yesterday but her daughter who accompanied her did not have any symptoms.  For the past week she has had cough and congestion but believes that it is improving.  She denies fevers and chills.  She continues to take NovoLog with meals despite not eating.  She denies dizziness, agitation, diaphoresis or other signs of hypoglycemia.   Past Medical History:  Diagnosis Date  . AKI (acute kidney injury) ([place]) 07/17/2017  . DIABETES MELLITUS, TYPE II 06/02/2006   Last HBA1C 8.7.   . HYPERLIPIDEMIA 06/02/2006   Qualifier: Diagnosis of  By: Patrice Paradise MD, Roderic Palau    . HYPERTENSION 06/02/2006   Filed Vitals:   01/21/11 1134  BP: 162/98  Pulse: 80  Temp: 97.4 F (36.3 C)  TempSrc: Oral  Resp: 20  Height: 5\' 8"  (1.727 m)  Weight: 261 lb (118.389 kg)     . Stage 2 chronic kidney disease 12/02/2017   Review of Systems:   Review of Systems  Constitutional: Negative for chills and fever.  Respiratory: Negative for cough and shortness of breath.   Cardiovascular: Negative for chest pain, palpitations and claudication.  Gastrointestinal: Positive for abdominal pain, diarrhea, nausea and vomiting. Negative for constipation.  Genitourinary: Negative for dysuria, frequency and urgency.  Neurological: Negative for dizziness and headaches.  All other systems reviewed and are negative.   Physical Exam:  Vitals:   05/03/18 1613  BP: (!) 150/90  Pulse: 98  Temp: 98.6 F (37 C)  TempSrc: Oral  SpO2: 99%  Weight: 240 lb (108.9 kg)   Physical Exam Vitals signs  reviewed.  Constitutional:      Appearance: Normal appearance.  HENT:     Head: Normocephalic and atraumatic.     Mouth/Throat:     Mouth: Mucous membranes are dry.     Pharynx: Oropharynx is clear.  Cardiovascular:     Rate and Rhythm: Normal rate and regular rhythm.     Pulses: Normal pulses.     Heart sounds: Normal heart sounds. No murmur.  Pulmonary:     Effort: Pulmonary effort is normal. No respiratory distress.     Breath sounds: Normal breath sounds.  Abdominal:     General: Abdomen is flat.     Palpations: Abdomen is soft.     Comments: Hyperactive bowel sounds, nontender to palpation  Skin:    General: Skin is warm and dry.  Neurological:     Mental Status: She is alert and oriented to person, place, and time. Mental status is at baseline.  Psychiatric:        Mood and Affect: Mood normal.        Behavior: Behavior normal.     Assessment & Plan:   See Encounters Tab for problem based charting.  Patient discussed with Dr. Angelia Mould

## 2018-05-03 NOTE — Telephone Encounter (Signed)
Ashley Long called wanting to know what to do if she took her maltime insulin, then could not keep food down. Her blood sugar was in the 80s before lunch, she took 3 units Novolog, then ate saltines and ginger ale, then vomited. Her blood sugar a few minutes ago was 101. She said she can keep juice and ginger ale down. I encouraged her to use sick day rules drinking juice and regular ginger ale 1 tablespoon every 5 minutes/ 15 grams carb ( 1/2 cup) every hour.  Told I would ask triage nurse to call.

## 2018-05-04 NOTE — Progress Notes (Signed)
Internal Medicine Clinic Attending  Case discussed with Dr. Prince at the time of the visit.  We reviewed the resident's history and exam and pertinent patient test results.  I agree with the assessment, diagnosis, and plan of care documented in the resident's note.   

## 2018-05-17 ENCOUNTER — Other Ambulatory Visit: Payer: Self-pay | Admitting: Internal Medicine

## 2018-05-17 DIAGNOSIS — I10 Essential (primary) hypertension: Secondary | ICD-10-CM

## 2018-05-17 DIAGNOSIS — E119 Type 2 diabetes mellitus without complications: Secondary | ICD-10-CM

## 2018-05-17 DIAGNOSIS — N183 Chronic kidney disease, stage 3 unspecified: Secondary | ICD-10-CM

## 2018-05-17 DIAGNOSIS — R112 Nausea with vomiting, unspecified: Secondary | ICD-10-CM

## 2018-05-17 MED ORDER — CLONIDINE HCL 0.3 MG PO TABS: 0.3000 mg | ORAL_TABLET | Freq: Two times a day (BID) | ORAL | 3 refills | Status: DC

## 2018-05-17 MED ORDER — BASAGLAR KWIKPEN 100 UNIT/ML ~~LOC~~ SOPN: 25.0000 [IU] | PEN_INJECTOR | Freq: Every day | SUBCUTANEOUS | 3 refills | Status: DC

## 2018-05-17 MED ORDER — PANTOPRAZOLE SODIUM 40 MG PO TBEC: 40.0000 mg | DELAYED_RELEASE_TABLET | Freq: Every day | ORAL | 11 refills | Status: DC

## 2018-05-17 MED ORDER — AMLODIPINE BESYLATE 10 MG PO TABS: 10.0000 mg | ORAL_TABLET | Freq: Every day | ORAL | 3 refills | Status: DC

## 2018-05-17 MED ORDER — SITAGLIPTIN PHOS-METFORMIN HCL 50-1000 MG PO TABS: 1.0000 | ORAL_TABLET | Freq: Two times a day (BID) | ORAL | 3 refills | Status: DC

## 2018-05-17 MED ORDER — CARVEDILOL 25 MG PO TABS: 25.0000 mg | ORAL_TABLET | Freq: Two times a day (BID) | ORAL | 3 refills | Status: DC

## 2018-05-17 NOTE — Telephone Encounter (Signed)
Call from patient requesting refill request to be sent to [facility] for "all medications".  Will send to pcp for review.Regenia Skeeter, Delania Ferg Cassady3/2/20209:44 AM   Attempted the add Humalog to refill request and received the follow message: The following medication records are no longer available for ordering. Place a new order with a different medication record. insulin lispro (HUMALOG) 100 UNIT/ML KiwkPen. Please send in refill if appropriate.Despina Hidden Cassady3/2/20209:49 AM

## 2018-05-17 NOTE — Telephone Encounter (Signed)
losartan (COZAAR) 25 MG tablet, refill request @  [place], [place] - [address]. [phone] (Phone) [phone] (Fax)

## 2018-05-18 NOTE — Telephone Encounter (Signed)
Pt states she just received all of the meds today from [facility], and they did included Losartan.

## 2018-05-18 NOTE — Telephone Encounter (Signed)
Requesting to speak with a nurse about getting a refill on losartan at the outpatient pharmacy.

## 2018-05-18 NOTE — Telephone Encounter (Signed)
Called pt - no answer; per chart, last Losartan rx  was sent to [facility]. But prior message suggest she wants it refilled at [facility].

## 2018-05-18 NOTE — Telephone Encounter (Signed)
Requesting to speak to a nurse regarding medicine; pt contact [phone]

## 2018-05-19 NOTE — Addendum Note (Signed)
Addended by: Nicola Girt on: 05/19/2018 12:57 PM   Modules accepted: Orders

## 2018-05-19 NOTE — Addendum Note (Signed)
Addended by: Nicola Girt on: 05/19/2018 03:24 PM   Modules accepted: Orders

## 2018-05-31 ENCOUNTER — Telehealth: Payer: Self-pay | Admitting: Internal Medicine

## 2018-05-31 NOTE — Telephone Encounter (Signed)
rtc lm for rtc 

## 2018-05-31 NOTE — Telephone Encounter (Signed)
Requesting nurse to call back [phone]

## 2018-06-10 NOTE — Telephone Encounter (Signed)
Lm for rtc 

## 2018-06-11 NOTE — Telephone Encounter (Signed)
lm

## 2018-07-07 ENCOUNTER — Encounter: Payer: Self-pay | Admitting: Internal Medicine

## 2018-08-02 ENCOUNTER — Other Ambulatory Visit: Payer: Self-pay | Admitting: Internal Medicine

## 2018-08-02 DIAGNOSIS — Z1231 Encounter for screening mammogram for malignant neoplasm of breast: Secondary | ICD-10-CM

## 2018-08-11 ENCOUNTER — Ambulatory Visit (INDEPENDENT_AMBULATORY_CARE_PROVIDER_SITE_OTHER): Payer: Self-pay | Admitting: Internal Medicine

## 2018-08-11 ENCOUNTER — Encounter: Payer: Self-pay | Admitting: Internal Medicine

## 2018-08-11 ENCOUNTER — Other Ambulatory Visit: Payer: Self-pay

## 2018-08-11 VITALS — BP 194/95 | HR 82 | Temp 98.2°F | Wt 245.9 lb

## 2018-08-11 DIAGNOSIS — N183 Chronic kidney disease, stage 3 unspecified: Secondary | ICD-10-CM

## 2018-08-11 DIAGNOSIS — I10 Essential (primary) hypertension: Secondary | ICD-10-CM

## 2018-08-11 DIAGNOSIS — Z794 Long term (current) use of insulin: Secondary | ICD-10-CM

## 2018-08-11 DIAGNOSIS — I129 Hypertensive chronic kidney disease with stage 1 through stage 4 chronic kidney disease, or unspecified chronic kidney disease: Secondary | ICD-10-CM

## 2018-08-11 DIAGNOSIS — Z79899 Other long term (current) drug therapy: Secondary | ICD-10-CM

## 2018-08-11 DIAGNOSIS — E119 Type 2 diabetes mellitus without complications: Secondary | ICD-10-CM

## 2018-08-11 DIAGNOSIS — E1122 Type 2 diabetes mellitus with diabetic chronic kidney disease: Secondary | ICD-10-CM

## 2018-08-11 LAB — GLUCOSE, CAPILLARY: Glucose-Capillary: 107 mg/dL — ABNORMAL HIGH (ref 70–99)

## 2018-08-11 LAB — POCT GLYCOSYLATED HEMOGLOBIN (HGB A1C): Hemoglobin A1C: 8.3 % — AB (ref 4.0–5.6)

## 2018-08-11 MED ORDER — INSULIN LISPRO (1 UNIT DIAL) 100 UNIT/ML (KWIKPEN): 3.0000 [IU] | PEN_INJECTOR | Freq: Three times a day (TID) | SUBCUTANEOUS | 5 refills | Status: DC

## 2018-08-11 MED ORDER — BASAGLAR KWIKPEN 100 UNIT/ML ~~LOC~~ SOPN: 25.0000 [IU] | PEN_INJECTOR | Freq: Every day | SUBCUTANEOUS | 3 refills | Status: DC

## 2018-08-11 MED ORDER — LOSARTAN POTASSIUM 25 MG PO TABS: 25.0000 mg | ORAL_TABLET | Freq: Every day | ORAL | 3 refills | Status: DC

## 2018-08-11 MED ORDER — SPIRONOLACTONE 25 MG PO TABS: 25.0000 mg | ORAL_TABLET | Freq: Every day | ORAL | 1 refills | Status: DC

## 2018-08-11 NOTE — Assessment & Plan Note (Signed)
Hypertension: Patient's BP today is 195/95 with a goal of <140/80. The patient endorses adherence to her medication regimen. She denied, chest pain, headache, visual changes, lightheadedness, weakness, dizziness on standing, swelling in the feet or ankles. She demonstrates averages on her calibrated home sphygmomanometer of 841'Q systolic. She has undergone imaging, aldo/renin, and more evaluation in the past. I feel she is now appropriate for a referral to nephrology and have discussed this at length with the patient.   Plan: Placed referral to nephrology Start Spironolactone 25mg  daily Continue HCTZ 25mg  daily Continue amlodipine 10mg  daily Continue carvedilol 25mg  BID Continue Clonidine 0.3mg  BID Continue Losartan 25mg  daily F/up BMP today Repeat BMP in one month

## 2018-08-11 NOTE — Progress Notes (Signed)
   CC: DMII and HTN follow-up  HPI:Ashley Long is a 58 y.o. female who presents for evaluation of DMII, HTN and CKD. Please see individual problem based A/P for details.  Past Medical History:  Diagnosis Date  . AKI (acute kidney injury) ([place]) 07/17/2017  . DIABETES MELLITUS, TYPE II 06/02/2006   Last HBA1C 8.7.   . HYPERLIPIDEMIA 06/02/2006   Qualifier: Diagnosis of  By: Patrice Paradise MD, Roderic Palau    . HYPERTENSION 06/02/2006   Filed Vitals:   01/21/11 1134  BP: 162/98  Pulse: 80  Temp: 97.4 F (36.3 C)  TempSrc: Oral  Resp: 20  Height: 5\' 8"  (1.727 m)  Weight: 261 lb (118.389 kg)     . Stage 2 chronic kidney disease 12/02/2017   Review of Systems:  ROS negative except as per HPI.  Physical Exam: Vitals:   08/11/18 1512  BP: (!) 194/95  Pulse: 82  Temp: 98.2 F (36.8 C)  TempSrc: Oral  Weight: 245 lb 14.4 oz (111.5 kg)   General: A/O x4, in no acute distress, afebrile, nondiaphoretic Cardio: RRR, no mrg's  Pulmonary: CTA bilaterally, no wheezing or crackles  Abdomen: Bowel sounds normal, soft, nontender  MSK: BLE nontender, nonedematous Neuro: Conversational, normal gait Psych: Appropriate affect, not depressed in appearance, engages well  Assessment & Plan:   See Encounters Tab for problem based charting.  Patient discussed with Dr. Angelia Mould

## 2018-08-11 NOTE — Assessment & Plan Note (Addendum)
  DMII: Patient stated that she continues to have occasional episodes of hypoglycemia. Her current insulin regimen is difficult to follow with the sliding scale and at work. Given the results of the [place] trial and after reviewing her GFR, I feel that if this remains stable on repeat today we can consider an SGLT-2i while discontinuing the meal time insulin. I would continue the glargine at 20U QHS and the sitagliptan metformin for now as well.  A1c stable although slightly increased to 8.3 today.   Plan:  Continue humalog insulin 3-8U TID SSI Continue insulin glargine 25U QHS Continue sitagliptin-metformin (Janumet) 50-1000mg  BID F/up in one month

## 2018-08-11 NOTE — Assessment & Plan Note (Signed)
CKDIII: Ordered BMP. Likely 2/2 to HTN. Mild proteinuria. Will work more to control her HTN and DMII.   Plan:  Will repeat BMP today Placed referral to Nephrology

## 2018-08-11 NOTE — Patient Instructions (Signed)
FOLLOW-UP INSTRUCTIONS When: 4-6 wks for a lab draw  I have started spironolactone today at 25mg  daily. Please make certain to take this daily and to monitor for changes to your blood pressure. It may take 1-2 weeks to notice the full effect.   Please call tomorrow and schedule an appointment for a lab draw in one month. When the results are released from this lab I will call you to discuss additional treatment and go over your blood pressures at that time as well. Then, we can decide on starting the [place].   Thank you for your visit to the [facility] today. If you have any questions or concerns please call us at [phone].

## 2018-08-12 LAB — BMP8+ANION GAP
Anion Gap: 18 mmol/L (ref 10.0–18.0)
BUN/Creatinine Ratio: 18 (ref 9–23)
BUN: 24 mg/dL (ref 6–24)
CO2: 23 mmol/L (ref 20–29)
Calcium: 10.2 mg/dL (ref 8.7–10.2)
Chloride: 99 mmol/L (ref 96–106)
Creatinine, Ser: 1.31 mg/dL — ABNORMAL HIGH (ref 0.57–1.00)
GFR calc Af Amer: 53 mL/min/{1.73_m2} — ABNORMAL LOW (ref >59)
GFR calc non Af Amer: 46 mL/min/{1.73_m2} — ABNORMAL LOW (ref >59)
Glucose: 112 mg/dL — ABNORMAL HIGH (ref 65–99)
Potassium: 4 mmol/L (ref 3.5–5.2)
Sodium: 140 mmol/L (ref 134–144)

## 2018-08-13 ENCOUNTER — Telehealth: Payer: Self-pay | Admitting: Internal Medicine

## 2018-08-13 NOTE — Telephone Encounter (Signed)
Attempted to contact patient but no answer. LVM to call back and request results from nurse. Please inform patient that her blood work, the BMP from the 27th demonstrated a decreased serum creatinine representing improved renal function as compared to the prior several labs. We should continue with the plan to repeat the BMP in one month at a lab visit only. From there, we can discuss making further adjustments for her blood pressure and diabetes.

## 2018-08-13 NOTE — Telephone Encounter (Signed)
Please refer to note from early today. Thank you

## 2018-08-13 NOTE — Telephone Encounter (Signed)
rtc to pt, read her answer from dr Berline Lopes, scheduled lab appt 6/29, order in place

## 2018-08-13 NOTE — Telephone Encounter (Signed)
Pt returning phone call from Dr. Berline Lopes in reference to her Test results

## 2018-08-16 ENCOUNTER — Other Ambulatory Visit: Payer: Self-pay | Admitting: *Deleted

## 2018-08-16 DIAGNOSIS — I10 Essential (primary) hypertension: Secondary | ICD-10-CM

## 2018-08-16 MED ORDER — SPIRONOLACTONE 25 MG PO TABS: 25.0000 mg | ORAL_TABLET | Freq: Every day | ORAL | 1 refills | Status: DC

## 2018-08-16 NOTE — Telephone Encounter (Signed)
Dr Berline Lopes,  Pt received printed copy during visit, but was unable to get filled (missing signature). Can you send to [facility]? Thank you.Despina Hidden Cassady6/1/20202:51 PM

## 2018-08-16 NOTE — Telephone Encounter (Signed)
Signatures are not normally necessary for substances that are not listed as controlled by the federal government on paper scripts. Never the less I will send a electronic script to the pharmacy. Thank you

## 2018-08-17 NOTE — Progress Notes (Signed)
Internal Medicine Clinic Attending  Case discussed with Dr. Harbrecht at the time of the visit.  We reviewed the resident's history and exam and pertinent patient test results.  I agree with the assessment, diagnosis, and plan of care documented in the resident's note.   

## 2018-08-18 ENCOUNTER — Ambulatory Visit: Payer: Self-pay

## 2018-08-18 ENCOUNTER — Other Ambulatory Visit: Payer: Self-pay

## 2018-09-13 ENCOUNTER — Other Ambulatory Visit: Payer: Self-pay

## 2018-09-13 ENCOUNTER — Other Ambulatory Visit (INDEPENDENT_AMBULATORY_CARE_PROVIDER_SITE_OTHER): Payer: Self-pay

## 2018-09-13 DIAGNOSIS — I10 Essential (primary) hypertension: Secondary | ICD-10-CM

## 2018-09-14 LAB — BMP8+ANION GAP
Anion Gap: 17 mmol/L (ref 10.0–18.0)
BUN/Creatinine Ratio: 13 (ref 9–23)
BUN: 22 mg/dL (ref 6–24)
CO2: 20 mmol/L (ref 20–29)
Calcium: 9.8 mg/dL (ref 8.7–10.2)
Chloride: 102 mmol/L (ref 96–106)
Creatinine, Ser: 1.67 mg/dL — ABNORMAL HIGH (ref 0.57–1.00)
GFR calc Af Amer: 39 mL/min/{1.73_m2} — ABNORMAL LOW (ref >59)
GFR calc non Af Amer: 34 mL/min/{1.73_m2} — ABNORMAL LOW (ref >59)
Glucose: 217 mg/dL — ABNORMAL HIGH (ref 65–99)
Potassium: 4.6 mmol/L (ref 3.5–5.2)
Sodium: 139 mmol/L (ref 134–144)

## 2018-09-23 ENCOUNTER — Other Ambulatory Visit: Payer: Self-pay | Admitting: Nephrology

## 2018-09-23 DIAGNOSIS — N183 Chronic kidney disease, stage 3 unspecified: Secondary | ICD-10-CM

## 2018-09-27 ENCOUNTER — Ambulatory Visit
Admission: RE | Admit: 2018-09-27 | Discharge: 2018-09-27 | Disposition: A | Discharge status: Home / Self Care | Payer: No Typology Code available for payment source | Source: Ambulatory Visit | Attending: Internal Medicine | Admitting: Internal Medicine

## 2018-09-27 DIAGNOSIS — Z1231 Encounter for screening mammogram for malignant neoplasm of breast: Secondary | ICD-10-CM

## 2018-09-29 ENCOUNTER — Ambulatory Visit
Admission: RE | Admit: 2018-09-29 | Discharge: 2018-09-29 | Disposition: A | Discharge status: Home / Self Care | Payer: No Typology Code available for payment source | Source: Ambulatory Visit | Attending: Nephrology | Admitting: Nephrology

## 2018-09-29 DIAGNOSIS — N183 Chronic kidney disease, stage 3 unspecified: Secondary | ICD-10-CM

## 2018-10-05 ENCOUNTER — Other Ambulatory Visit: Payer: Self-pay | Admitting: Gastroenterology

## 2018-10-22 ENCOUNTER — Telehealth: Payer: Self-pay | Admitting: Internal Medicine

## 2018-10-22 DIAGNOSIS — E119 Type 2 diabetes mellitus without complications: Secondary | ICD-10-CM

## 2018-10-22 NOTE — Telephone Encounter (Signed)
Placed referral to ophthalmology

## 2018-10-25 ENCOUNTER — Telehealth: Payer: Self-pay | Admitting: Dietician

## 2018-10-25 NOTE — Telephone Encounter (Signed)
Ms Ashley Long called for confirmation about which insulin pen is her lantus pen- she is preparing for her colonoscopy on 8/25;   She was told to take 12 units Lantus- we discussed that this is her basaglar insulin. She verbalized understanding.

## 2018-10-26 ENCOUNTER — Telehealth: Payer: Self-pay | Admitting: *Deleted

## 2018-10-26 NOTE — Telephone Encounter (Signed)
Spoke with patient to let her know that her eye referral has been faxed (x2) to [facility] for review Ashley Long. Patient is aware that the referrals are on hold at the moment.

## 2018-11-02 ENCOUNTER — Other Ambulatory Visit: Payer: Self-pay | Admitting: Gastroenterology

## 2018-11-03 ENCOUNTER — Encounter: Payer: Self-pay | Admitting: Internal Medicine

## 2018-11-03 ENCOUNTER — Ambulatory Visit (INDEPENDENT_AMBULATORY_CARE_PROVIDER_SITE_OTHER): Payer: Self-pay | Admitting: Internal Medicine

## 2018-11-03 ENCOUNTER — Other Ambulatory Visit: Payer: Self-pay

## 2018-11-03 VITALS — BP 180/96 | HR 77 | Temp 98.6°F | Ht 68.0 in | Wt 242.5 lb

## 2018-11-03 DIAGNOSIS — E119 Type 2 diabetes mellitus without complications: Secondary | ICD-10-CM

## 2018-11-03 DIAGNOSIS — Z794 Long term (current) use of insulin: Secondary | ICD-10-CM

## 2018-11-03 DIAGNOSIS — E1122 Type 2 diabetes mellitus with diabetic chronic kidney disease: Secondary | ICD-10-CM

## 2018-11-03 DIAGNOSIS — Z79899 Other long term (current) drug therapy: Secondary | ICD-10-CM

## 2018-11-03 DIAGNOSIS — N1330 Unspecified hydronephrosis: Secondary | ICD-10-CM

## 2018-11-03 DIAGNOSIS — I10 Essential (primary) hypertension: Secondary | ICD-10-CM

## 2018-11-03 DIAGNOSIS — N183 Chronic kidney disease, stage 3 unspecified: Secondary | ICD-10-CM

## 2018-11-03 DIAGNOSIS — I129 Hypertensive chronic kidney disease with stage 1 through stage 4 chronic kidney disease, or unspecified chronic kidney disease: Secondary | ICD-10-CM

## 2018-11-03 LAB — POCT GLYCOSYLATED HEMOGLOBIN (HGB A1C): Hemoglobin A1C: 8.3 % — AB (ref 4.0–5.6)

## 2018-11-03 LAB — GLUCOSE, CAPILLARY: Glucose-Capillary: 179 mg/dL — ABNORMAL HIGH (ref 70–99)

## 2018-11-03 MED ORDER — LOSARTAN POTASSIUM 50 MG PO TABS: 50.0000 mg | ORAL_TABLET | Freq: Every day | ORAL | 3 refills | Status: DC

## 2018-11-03 NOTE — Patient Instructions (Signed)
  I have not made any changes to your medications today. Please continue to follow with the kidney doctor and the urologist. I will await their reports.   Today we discussed your high blood pressure, diabetes and kidney disease. Please continue your current medications. I will notify you of the results of any labs from today's evaluation when available to me and make any further adjustments as indicated at that time with your approval.   Thank you for your visit to the [facility] today. If you have any questions or concerns please call us at [phone].

## 2018-11-03 NOTE — Progress Notes (Signed)
   CC: HTN, CKDIII, and DMII  HPI:Ashley Long is a 58 y.o. female who presents for evaluation of HTN, DMII and CKDIIB. Please see individual problem based A/P for details.  Depression, PHQ-9: Based on the patients    Office Visit from 11/03/2018 in [place]  PHQ-9 Total Score  0     score we have decided to monitor.  Past Medical History:  Diagnosis Date  . AKI (acute kidney injury) ([place]) 07/17/2017  . DIABETES MELLITUS, TYPE II 06/02/2006   Last HBA1C 8.7.   . HYPERLIPIDEMIA 06/02/2006   Qualifier: Diagnosis of  By: Patrice Paradise MD, Roderic Palau    . HYPERTENSION 06/02/2006   Filed Vitals:   01/21/11 1134  BP: 162/98  Pulse: 80  Temp: 97.4 F (36.3 C)  TempSrc: Oral  Resp: 20  Height: 5\' 8"  (1.727 m)  Weight: 261 lb (118.389 kg)     . Stage 2 chronic kidney disease 12/02/2017   Review of Systems:  ROS negative except as per HPI.  Physical Exam: Vitals:   11/03/18 1544  BP: (!) 180/96  Pulse: 77  Temp: 98.6 F (37 C)  TempSrc: Oral  SpO2: 100%  Weight: 242 lb 8 oz (110 kg)  Height: 5\' 8"  (1.727 m)   General: A/O x4, in no acute distress, afebrile, nondiaphoretic HEENT: PEERL, EMO intact Cardio: RRR, no mrg's, bilateral dorsalis pedis and posterior  Pulmonary: CTA bilaterally, no wheezing or crackles  Abdomen: Bowel sounds normal, soft, nontender  MSK: BLE nontender, nonedematous Neuro: Alert, normal gait Psych: Appropriate affect, not depressed in appearance, engages well  Assessment & Plan:   See Encounters Tab for problem based charting.  Patient discussed with Dr. Evette Doffing

## 2018-11-03 NOTE — Assessment & Plan Note (Signed)
Chronic asymptomatic moderate/severe hypertension: Patient's BP today is 180/96 with a goal of <140/80. The patient endorses adherence to her medication regimen. She denied, chest pain, headache, visual changes, lightheadedness, weakness, dizziness on standing, swelling in the feet or ankles. The patient made her appointment with [facility] Dr. Royce Macadamia. Recommendations were to stop the spironolactone, and increase the Losartan to 50mg  daily. Her repeat renal US was concerning for mild bilateral hydronephrosis for which she is to follow with urology. I appreciate their assistance with treating Ashley Long HTN and CKDIII-B.  Plan: Continue HCTZ 25mg  daily Continue Losartan 500mg  daily Continue clonidine 0.3mg  BID Continue Amlodipine 10mg  daily Continue Carvedilol 25mg  BID BMP today Continue to follow with nephrology and urology

## 2018-11-03 NOTE — Assessment & Plan Note (Signed)
   CKDIII-B: Renal function worse the month prior at 1.67 from 1.31. Likely multifactorial. Felt to possibly be due to HTN and DMII. There is some bilateral hydronephrosis, I agree this warrants further evaluation. GFR 39. She is following with nephro and urology consult. Will f/up their recommendations  Plan:  BMP today

## 2018-11-03 NOTE — Assessment & Plan Note (Addendum)
DMII: Patients home CBG readings appear improved. Her A1c today however has remained unchanged at 8.3. Despite her rigorous attempts at carb counting, adjusting her insulin and dietary control with thorough record keeping her A1c is not to goal. We briefly discussed the addition of an SGLT-2 inhibitor today if her renal function maintains. She is agreeable to starting this. I will call her and discuss if her sCr is stable. We had previously discussed the possible side effects including serious groin infections as well as yeast/urinary infections.   Plan: BMP today Continue insulin aspart 3-8U based on CBG Continue insulin glargine, patient reported issues with this, will attempt to clarify dosing when I call her concerning her results. Continue Janumet 50-1000mg  BID with meals  Addendum: Patients Scr improved to 1.47 from 1.67 with GFR maintaining. I feel she remains a candidate for an SGLT-2 inhibitor particularly given her near 300mg /daily proteinuria.  Canagliflozin 100mg  daily script sent to [facility] for a three month supply. Will continue if this is well tolerated. Attempted to contact patient regarding her labs and the medication change x 2. There was no answer. IF she return the call please inform her of the additional medication as we had discussed in her visit. She is to continue to monitor her glucose levels and to notify us of any potential low blood glucose recordings.

## 2018-11-04 ENCOUNTER — Telehealth: Payer: Self-pay | Admitting: Internal Medicine

## 2018-11-04 ENCOUNTER — Encounter: Payer: Self-pay | Admitting: Internal Medicine

## 2018-11-04 LAB — BMP8+ANION GAP
Anion Gap: 18 mmol/L (ref 10.0–18.0)
BUN/Creatinine Ratio: 18 (ref 9–23)
BUN: 27 mg/dL — ABNORMAL HIGH (ref 6–24)
CO2: 23 mmol/L (ref 20–29)
Calcium: 9.8 mg/dL (ref 8.7–10.2)
Chloride: 98 mmol/L (ref 96–106)
Creatinine, Ser: 1.47 mg/dL — ABNORMAL HIGH (ref 0.57–1.00)
GFR calc Af Amer: 46 mL/min/{1.73_m2} — ABNORMAL LOW (ref >59)
GFR calc non Af Amer: 40 mL/min/{1.73_m2} — ABNORMAL LOW (ref >59)
Glucose: 184 mg/dL — ABNORMAL HIGH (ref 65–99)
Potassium: 4.2 mmol/L (ref 3.5–5.2)
Sodium: 139 mmol/L (ref 134–144)

## 2018-11-04 MED ORDER — CANAGLIFLOZIN 100 MG PO TABS: 100.0000 mg | ORAL_TABLET | Freq: Every day | ORAL | 0 refills | Status: DC

## 2018-11-04 NOTE — Telephone Encounter (Signed)
Patients Scr improved to 1.47 from 1.67. Given the improvement in GFR I feel she remains a candidate for an SGLT-2 inhibitor based on the CREDENCE trial. She has a near 300mg /daily proteinuria and as her A1c remains 8.3%.  I have ordered Canagliflozin 100mg  daily with breakfast. This script was sent to [facility] for a three month supply. We will continue if this is if it well tolerated.  Attempted to contact patient regarding her labs and the medication change x 3 on 11/04/2018. A voicemail was left for her to return the call. If she returns the call please inform her of the additional medication as we had discussed in her visit. She is to continue to monitor her glucose levels and to notify us of any potential low blood glucose recordings, particularly those that are symptomatic.  Kathi Ludwig, MD [facility] Internal Medicine, PGY-3

## 2018-11-04 NOTE — Progress Notes (Signed)
Internal Medicine Clinic Attending  Case discussed with Dr. Harbrecht at the time of the visit.  We reviewed the resident's history and exam and pertinent patient test results.  I agree with the assessment, diagnosis, and plan of care documented in the resident's note.   

## 2018-11-05 ENCOUNTER — Other Ambulatory Visit (HOSPITAL_COMMUNITY)
Admission: RE | Admit: 2018-11-05 | Discharge: 2018-11-05 | Disposition: A | Discharge status: Home / Self Care | Payer: HRSA Program | Source: Ambulatory Visit | Attending: Gastroenterology | Admitting: Gastroenterology

## 2018-11-05 DIAGNOSIS — Z01812 Encounter for preprocedural laboratory examination: Secondary | ICD-10-CM | POA: Diagnosis present

## 2018-11-05 DIAGNOSIS — Z20828 Contact with and (suspected) exposure to other viral communicable diseases: Secondary | ICD-10-CM | POA: Diagnosis not present

## 2018-11-05 LAB — SARS CORONAVIRUS 2 (TAT 6-24 HRS): SARS Coronavirus 2: NEGATIVE

## 2018-11-08 ENCOUNTER — Encounter (HOSPITAL_COMMUNITY): Payer: Self-pay | Admitting: Emergency Medicine

## 2018-11-08 ENCOUNTER — Other Ambulatory Visit: Payer: Self-pay

## 2018-11-08 NOTE — Progress Notes (Signed)
Pre-op endo call completed.   Patient aware to only take half dose of LANTUS tonight, no does of bedtime Humalog insulin , and advised to hold INVOKANA, and JANUMET but already took today. Advised nt to take any  diabetes meds day of procedure

## 2018-11-09 ENCOUNTER — Ambulatory Visit (HOSPITAL_COMMUNITY): Payer: Self-pay | Admitting: Certified Registered"

## 2018-11-09 ENCOUNTER — Encounter (HOSPITAL_COMMUNITY)
Admission: RE | Disposition: A | Discharge status: Home / Self Care | Payer: Self-pay | Source: Home / Self Care | Attending: Gastroenterology

## 2018-11-09 ENCOUNTER — Ambulatory Visit (HOSPITAL_COMMUNITY)
Admission: RE | Admit: 2018-11-09 | Discharge: 2018-11-09 | Disposition: A | Discharge status: Home / Self Care | Payer: Self-pay | Attending: Gastroenterology | Admitting: Gastroenterology

## 2018-11-09 ENCOUNTER — Encounter (HOSPITAL_COMMUNITY): Payer: Self-pay | Admitting: Certified Registered"

## 2018-11-09 DIAGNOSIS — I129 Hypertensive chronic kidney disease with stage 1 through stage 4 chronic kidney disease, or unspecified chronic kidney disease: Secondary | ICD-10-CM | POA: Insufficient documentation

## 2018-11-09 DIAGNOSIS — Z1211 Encounter for screening for malignant neoplasm of colon: Secondary | ICD-10-CM | POA: Insufficient documentation

## 2018-11-09 DIAGNOSIS — Z860100 Personal history of colon polyps, unspecified: Secondary | ICD-10-CM

## 2018-11-09 DIAGNOSIS — Z794 Long term (current) use of insulin: Secondary | ICD-10-CM | POA: Insufficient documentation

## 2018-11-09 DIAGNOSIS — K529 Noninfective gastroenteritis and colitis, unspecified: Secondary | ICD-10-CM | POA: Insufficient documentation

## 2018-11-09 DIAGNOSIS — E785 Hyperlipidemia, unspecified: Secondary | ICD-10-CM | POA: Insufficient documentation

## 2018-11-09 DIAGNOSIS — Z6836 Body mass index (BMI) 36.0-36.9, adult: Secondary | ICD-10-CM | POA: Insufficient documentation

## 2018-11-09 DIAGNOSIS — E1122 Type 2 diabetes mellitus with diabetic chronic kidney disease: Secondary | ICD-10-CM | POA: Insufficient documentation

## 2018-11-09 DIAGNOSIS — N182 Chronic kidney disease, stage 2 (mild): Secondary | ICD-10-CM | POA: Insufficient documentation

## 2018-11-09 DIAGNOSIS — Z8719 Personal history of other diseases of the digestive system: Secondary | ICD-10-CM | POA: Insufficient documentation

## 2018-11-09 DIAGNOSIS — E669 Obesity, unspecified: Secondary | ICD-10-CM | POA: Insufficient documentation

## 2018-11-09 DIAGNOSIS — Z87891 Personal history of nicotine dependence: Secondary | ICD-10-CM | POA: Insufficient documentation

## 2018-11-09 DIAGNOSIS — K640 First degree hemorrhoids: Secondary | ICD-10-CM | POA: Insufficient documentation

## 2018-11-09 HISTORY — PX: BIOPSY: SHX5522

## 2018-11-09 HISTORY — DX: Personal history of colon polyps, unspecified: Z86.0100

## 2018-11-09 HISTORY — DX: Unspecified hydronephrosis: N13.30

## 2018-11-09 HISTORY — PX: COLONOSCOPY WITH PROPOFOL: SHX5780

## 2018-11-09 HISTORY — DX: Unspecified open wound, right lower leg, initial encounter: S81.801A

## 2018-11-09 HISTORY — DX: Personal history of colonic polyps: Z86.010

## 2018-11-09 HISTORY — DX: Injury, unspecified, initial encounter: T14.90XA

## 2018-11-09 HISTORY — DX: Chronic kidney disease, stage 3 unspecified: N18.30

## 2018-11-09 LAB — GLUCOSE, CAPILLARY: Glucose-Capillary: 156 mg/dL — ABNORMAL HIGH (ref 70–99)

## 2018-11-09 SURGERY — COLONOSCOPY WITH PROPOFOL
Anesthesia: Monitor Anesthesia Care

## 2018-11-09 MED ORDER — SODIUM CHLORIDE 0.9 % IV SOLN
INTRAVENOUS | Status: DC
  Administered 2018-11-09: 09:00:00 via INTRAVENOUS

## 2018-11-09 MED ORDER — PROPOFOL 10 MG/ML IV BOLUS
INTRAVENOUS | Status: DC | PRN
  Administered 2018-11-09 (×3): 20 mg via INTRAVENOUS
  Administered 2018-11-09: 40 mg via INTRAVENOUS
  Administered 2018-11-09 (×5): 20 mg via INTRAVENOUS
  Administered 2018-11-09: 40 mg via INTRAVENOUS

## 2018-11-09 MED ORDER — LIDOCAINE 2% (20 MG/ML) 5 ML SYRINGE
INTRAMUSCULAR | Status: DC | PRN
  Administered 2018-11-09: 40 mg via INTRAVENOUS

## 2018-11-09 MED ORDER — PROPOFOL 10 MG/ML IV BOLUS
INTRAVENOUS | Status: AC
  Filled 2018-11-09: qty 40

## 2018-11-09 SURGICAL SUPPLY — 21 items

## 2018-11-09 NOTE — Anesthesia Procedure Notes (Signed)
Procedure Name: MAC Date/Time: 11/09/2018 10:33 AM Performed by: Cynda Familia, CRNA Pre-anesthesia Checklist: Patient identified, Emergency Drugs available, Suction available, Patient being monitored and Timeout performed Patient Re-evaluated:Patient Re-evaluated prior to induction Oxygen Delivery Method: Simple face mask Placement Confirmation: breath sounds checked- equal and bilateral and positive ETCO2 Dental Injury: Teeth and Oropharynx as per pre-operative assessment

## 2018-11-09 NOTE — Op Note (Signed)
[facility] Patient Name: Ashley Long Procedure Date: 11/09/2018 MRN: [id number] Attending MD: Lear Ng , MD Date of Birth: 1960-10-28 CSN: [id number] Age: 58 Admit Type: Inpatient Procedure:                Colonoscopy Indications:              High risk colon cancer surveillance: Personal                            history of colonic polyps, Last colonoscopy:                            January 2015 Providers:                Lear Ng, MD, Angus Seller, Ladona Ridgel, Technician Referring MD:              Medicines:                Propofol per Anesthesia, Monitored Anesthesia Care Complications:            No immediate complications. Estimated Blood Loss:     Estimated blood loss was minimal. Procedure:                Pre-Anesthesia Assessment:                           - Prior to the procedure, a History and Physical                            was performed, and patient medications and                            allergies were reviewed. The patient's tolerance of                            previous anesthesia was also reviewed. The risks                            and benefits of the procedure and the sedation                            options and risks were discussed with the patient.                            All questions were answered, and informed consent                            was obtained. Prior Anticoagulants: The patient has                            taken no previous anticoagulant or antiplatelet                            agents. ASA  Grade Assessment: III - A patient with                            severe systemic disease. After reviewing the risks                            and benefits, the patient was deemed in                            satisfactory condition to undergo the procedure.                           After obtaining informed consent, the colonoscope                            was  passed under direct vision. Throughout the                            procedure, the patient's blood pressure, pulse, and                            oxygen saturations were monitored continuously. The                            [id number] Olympus pediatric colonscope                            was introduced through the anus and advanced to the                            the cecum, identified by appendiceal orifice and                            ileocecal valve. The colonoscopy was performed                            without difficulty. The patient tolerated the                            procedure well. The quality of the bowel                            preparation was fair and fair but repeated                            irrigation led to a good and adequate prep. The                            terminal ileum, ileocecal valve, appendiceal                            orifice, and rectum were photographed. Scope In: 10:40:15 AM Scope Out: 10:55:07 AM Scope Withdrawal Time: 0 hours 12 minutes 5 seconds  Total Procedure Duration: 0  hours 14 minutes 52 seconds  Findings:      The perianal and digital rectal examinations were normal.      A segmental area of mildly congested, erythematous, inflamed and       vascular-pattern-decreased mucosa was found in the recto-sigmoid colon       and in the sigmoid colon. Biopsies were taken with a cold forceps for       histology. Estimated blood loss was minimal.      Internal hemorrhoids were found during retroflexion. The hemorrhoids       were medium-sized and Grade I (internal hemorrhoids that do not       prolapse).      The exam was otherwise normal throughout the examined colon.      The terminal ileum appeared normal. Impression:               - Preparation of the colon was fair.                           - Congested, erythematous, inflamed and                            vascular-pattern-decreased mucosa in the                             recto-sigmoid colon and in the sigmoid colon.                            Biopsied.                           - Internal hemorrhoids.                           - The examined portion of the ileum was normal. Moderate Sedation:      Not Applicable - Patient had care per Anesthesia. Recommendation:           - Patient has a contact number available for                            emergencies. The signs and symptoms of potential                            delayed complications were discussed with the                            patient. Return to normal activities tomorrow.                            Written discharge instructions were provided to the                            patient.                           - High fiber diet.                           -  Await pathology results.                           - Repeat colonoscopy for surveillance based on                            pathology results. Procedure Code(s):        --- Professional ---                           [phone], Colonoscopy, flexible; with biopsy, single                            or multiple Diagnosis Code(s):        --- Professional ---                           Z86.010, Personal history of colonic polyps                           K52.9, Noninfective gastroenteritis and colitis,                            unspecified                           K63.89, Other specified diseases of intestine                           K64.0, First degree hemorrhoids CPT copyright 2019 American Medical Association. All rights reserved. The codes documented in this report are preliminary and upon coder review may  be revised to meet current compliance requirements. Lear Ng, MD 11/09/2018 11:07:32 AM This report has been signed electronically. Number of Addenda: 0

## 2018-11-09 NOTE — Discharge Instructions (Signed)
Will call you when pathology results are complete. °

## 2018-11-09 NOTE — Anesthesia Preprocedure Evaluation (Signed)
Anesthesia Evaluation  Patient identified by MRN, date of birth, ID band Patient awake    Reviewed: Allergy & Precautions, H&P , NPO status , Patient's Chart, lab work & pertinent test results  Airway Mallampati: II  TM Distance: >3 FB Neck ROM: Full    Dental no notable dental hx.    Pulmonary neg pulmonary ROS, former smoker,    Pulmonary exam normal breath sounds clear to auscultation       Cardiovascular Exercise Tolerance: Good hypertension, Pt. on medications negative cardio ROS Normal cardiovascular exam Rhythm:Regular Rate:Normal     Neuro/Psych negative neurological ROS  negative psych ROS   GI/Hepatic negative GI ROS, Neg liver ROS,   Endo/Other  diabetes  Renal/GU CRFRenal diseaseCR 1.47     Musculoskeletal negative musculoskeletal ROS (+)   Abdominal (+) + obese,   Peds  Hematology negative hematology ROS (+)   Anesthesia Other Findings   Reproductive/Obstetrics                             Lab Results  Component Value Date   CREATININE 1.47 (H) 11/03/2018   BUN 27 (H) 11/03/2018   NA 139 11/03/2018   K 4.2 11/03/2018   CL 98 11/03/2018   CO2 23 11/03/2018    Lab Results  Component Value Date   WBC 7.7 01/13/2018   HGB 11.9 (L) 01/13/2018   HCT 37.3 01/13/2018   MCV 89.0 01/13/2018   PLT 261 01/13/2018    Anesthesia Physical Anesthesia Plan  ASA: III  Anesthesia Plan: MAC   Post-op Pain Management:    Induction:   PONV Risk Score and Plan: Treatment may vary due to age or medical condition  Airway Management Planned: Natural Airway and Nasal Cannula  Additional Equipment:   Intra-op Plan:   Post-operative Plan:   Informed Consent: I have reviewed the patients History and Physical, chart, labs and discussed the procedure including the risks, benefits and alternatives for the proposed anesthesia with the patient or authorized representative who has  indicated his/her understanding and acceptance.     Dental advisory given  Plan Discussed with: CRNA  Anesthesia Plan Comments: (Hx of Colon polyps for Repeat colonoscopy)        Anesthesia Quick Evaluation

## 2018-11-09 NOTE — Anesthesia Postprocedure Evaluation (Signed)
Anesthesia Post Note  Patient: Ashley Long  Procedure(s) Performed: COLONOSCOPY WITH PROPOFOL (N/A ) BIOPSY     Patient location during evaluation: Endoscopy Anesthesia Type: MAC Level of consciousness: awake and alert Pain management: pain level controlled Vital Signs Assessment: post-procedure vital signs reviewed and stable Respiratory status: spontaneous breathing, nonlabored ventilation, respiratory function stable and patient connected to nasal cannula oxygen Cardiovascular status: blood pressure returned to baseline and stable Postop Assessment: no apparent nausea or vomiting Anesthetic complications: no    Last Vitals:  Vitals:   11/09/18 1110 11/09/18 1120  BP: (!) 181/98 (!) 195/86  Pulse: 65 67  Resp: 14 16  Temp:    SpO2: 100% 100%    Last Pain:  Vitals:   11/09/18 1120  TempSrc:   PainSc: 0-No pain                 Barnet Glasgow

## 2018-11-09 NOTE — H&P (Signed)
Date of Initial H&P: 10/25/18  History reviewed, patient examined, no change in status, stable for surgery. 

## 2018-11-09 NOTE — Interval H&P Note (Signed)
History and Physical Interval Note:  11/09/2018 10:31 AM  Ashley Long  has presented today for surgery, with the diagnosis of History of colon polyps.  The various methods of treatment have been discussed with the patient and family. After consideration of risks, benefits and other options for treatment, the patient has consented to  Procedure(s): COLONOSCOPY WITH PROPOFOL (N/A) as a surgical intervention.  The patient's history has been reviewed, patient examined, no change in status, stable for surgery.  I have reviewed the patient's chart and labs.  Questions were answered to the patient's satisfaction.     Lear Ng

## 2018-11-09 NOTE — Transfer of Care (Signed)
Immediate Anesthesia Transfer of Care Note  Patient: Ashley Long  Procedure(s) Performed: COLONOSCOPY WITH PROPOFOL (N/A ) BIOPSY  Patient Location: PACU and Endoscopy Unit  Anesthesia Type:MAC  Level of Consciousness: awake and alert   Airway & Oxygen Therapy: Patient Spontanous Breathing and Patient connected to face mask oxygen  Post-op Assessment: Report given to RN and Post -op Vital signs reviewed and stable  Post vital signs: Reviewed  Last Vitals:  Vitals Value Taken Time  BP    Temp    Pulse 71 11/09/18 1102  Resp 16 11/09/18 1102  SpO2 100 % 11/09/18 1102  Vitals shown include unvalidated device data.  Last Pain:  Vitals:   11/09/18 0842  TempSrc: Oral  PainSc: 0-No pain         Complications: No apparent anesthesia complications

## 2018-11-11 ENCOUNTER — Encounter (HOSPITAL_COMMUNITY): Payer: Self-pay | Admitting: Gastroenterology

## 2018-11-12 DIAGNOSIS — N1330 Unspecified hydronephrosis: Secondary | ICD-10-CM | POA: Insufficient documentation

## 2018-11-12 NOTE — Assessment & Plan Note (Signed)
Placed referral to Urology.

## 2018-11-12 NOTE — Addendum Note (Signed)
Addended by: Nicola Girt on: 11/12/2018 10:05 AM   Modules accepted: Orders

## 2018-12-16 ENCOUNTER — Other Ambulatory Visit: Payer: Self-pay | Admitting: Internal Medicine

## 2018-12-16 DIAGNOSIS — E119 Type 2 diabetes mellitus without complications: Secondary | ICD-10-CM

## 2018-12-16 DIAGNOSIS — I10 Essential (primary) hypertension: Secondary | ICD-10-CM

## 2018-12-31 ENCOUNTER — Other Ambulatory Visit (HOSPITAL_COMMUNITY): Payer: Self-pay | Admitting: Urology

## 2018-12-31 DIAGNOSIS — N130 Hydronephrosis with ureteropelvic junction obstruction: Secondary | ICD-10-CM

## 2019-01-03 ENCOUNTER — Telehealth: Payer: Self-pay | Admitting: Dietician

## 2019-01-03 NOTE — Telephone Encounter (Addendum)
Ms Kohen called to ask about taking meal time insulin day of her CT scan. She agrees to take her usual breakfast dose at 630 AM, check blood sugar 10:30 AM and drink ~4oz juice if needed to bring her blood sugar up to at least 120 for an 11 AM fast for her 3 PM CT scan. Resume her usual schedule at dinner. She also reports doing well with her meal plan; eating fruits, vegetables, legumes, nuts and weight continues to decrease as a result.  She has been taking invokana for about a month,her blood pressure is much improved( see below home readings she reported) has had a few episodes of hypoglycemia and made the appropriate adjustments/treatments. She does not think she is having more than her usual amount of hypoglycemia. She thinkssome of her higher readings are due to snacks between meals. We reviewed her meal plan which sounds appropriate although she was not able to tell me a specific goal for her carbohydrates at her meals. Her insulin regimen is: basaglar 24 u at 530 pm, 5 units Humalog at breakfast, 3-5 at lunch and dinner depending on her premeal blood sugar.  Sept 21-27 [id number] 126-80-130-/44/126- does not know why this happened Oct 5-11 163- [id number] [phone] 131-120-170 Today: 128-151  Blood pressure- 106/73 with pulse 70 111/74   119/85, 125/86, 99/77, 99/72, 122/86

## 2019-01-06 ENCOUNTER — Ambulatory Visit (HOSPITAL_COMMUNITY)
Admission: RE | Admit: 2019-01-06 | Discharge: 2019-01-06 | Disposition: A | Discharge status: Home / Self Care | Payer: Self-pay | Source: Ambulatory Visit | Attending: Urology | Admitting: Urology

## 2019-01-06 ENCOUNTER — Other Ambulatory Visit: Payer: Self-pay

## 2019-01-06 DIAGNOSIS — N130 Hydronephrosis with ureteropelvic junction obstruction: Secondary | ICD-10-CM | POA: Insufficient documentation

## 2019-01-06 LAB — POCT I-STAT CREATININE: Creatinine, Ser: 1.2 mg/dL — ABNORMAL HIGH (ref 0.44–1.00)

## 2019-01-06 MED ORDER — IOHEXOL 300 MG/ML  SOLN
125.0000 mL | Freq: Once | INTRAMUSCULAR | Status: AC | PRN
  Administered 2019-01-06: 125 mL via INTRAVENOUS

## 2019-01-17 ENCOUNTER — Telehealth: Payer: Self-pay | Admitting: Dietician

## 2019-01-17 NOTE — Telephone Encounter (Signed)
diabetes support provided.

## 2019-02-01 ENCOUNTER — Ambulatory Visit: Payer: No Typology Code available for payment source | Admitting: Dietician

## 2019-02-01 ENCOUNTER — Encounter: Payer: No Typology Code available for payment source | Admitting: Internal Medicine

## 2019-02-14 ENCOUNTER — Ambulatory Visit: Payer: No Typology Code available for payment source

## 2019-02-14 ENCOUNTER — Other Ambulatory Visit: Payer: Self-pay

## 2019-02-14 ENCOUNTER — Ambulatory Visit (INDEPENDENT_AMBULATORY_CARE_PROVIDER_SITE_OTHER): Payer: Self-pay | Admitting: *Deleted

## 2019-02-14 DIAGNOSIS — Z23 Encounter for immunization: Secondary | ICD-10-CM

## 2019-04-19 NOTE — Progress Notes (Addendum)
   CC: DMII and HTN  HPI:Ashley Long is a 59 y.o. female who presents for evaluation of DMII and HTN. Please see individual problem based A/P for details.  Depression, PHQ-9: Based on the patients    Office Visit from 11/03/2018 in [place]  PHQ-9 Total Score  0     score we have decided to monitor.  Past Medical History:  Diagnosis Date  . AKI (acute kidney injury) ([place]) 07/17/2017  . Chronic kidney disease (CKD), stage III (moderate) ([facility])   . DIABETES MELLITUS, TYPE II 06/02/2006   Last HBA1C 8.7. , check 3 times daily  . Hydronephrosis    sees Dr Royce Macadamia at [facility] ,  . HYPERLIPIDEMIA 06/02/2006   Qualifier: Diagnosis of  By: Patrice Paradise MD, Roderic Palau    . HYPERTENSION 06/02/2006   Filed Vitals:   01/21/11 1134  BP: 162/98  Pulse: 80  Temp: 97.4 F (36.3 C)  TempSrc: Oral  Resp: 20  Height: 5\' 8"  (1.727 m)  Weight: 261 lb (118.389 kg)     . Injury   . Stage 2 chronic kidney disease 12/02/2017   now stage 3   . Wound of right leg    right abover ankle, patient report sshe sustained a week ago from today  (11-08-2018) after hitting her leg on an object , reports wound is scabbed over and her pcp checked it out at [facility]  some days ago and was not converned, patient denies redness or pain at sit eof wound    Review of Systems:  ROS negative except as per HPI.  Physical Exam: Vitals:   04/20/19 1514  BP: (!) 175/83  Pulse: 78  Weight: 231 lb 3.2 oz (104.9 kg)    Filed Weights   04/20/19 1514  Weight: 231 lb 3.2 oz (104.9 kg)   General: A/O x4, in no acute distress, afebrile, nondiaphoretic HEENT: PEERL, EMO intact Cardio: RRR, no mrg's  Pulmonary: CTA bilaterally,  no wheezing or crackles  Abdomen: Bowel sounds normal, soft, nontender  MSK: BLE nontender, nonedematous Psych: Appropriate affect, not depressed in appearance, engages well  Assessment & Plan:   See Encounters Tab for problem based charting.  Patient discussed with Dr. Angelia Mould

## 2019-04-20 ENCOUNTER — Encounter: Payer: Self-pay | Admitting: Internal Medicine

## 2019-04-20 ENCOUNTER — Ambulatory Visit (INDEPENDENT_AMBULATORY_CARE_PROVIDER_SITE_OTHER): Payer: Self-pay | Admitting: Internal Medicine

## 2019-04-20 ENCOUNTER — Other Ambulatory Visit: Payer: Self-pay

## 2019-04-20 VITALS — BP 175/83 | HR 78 | Temp 98.4°F | Wt 231.2 lb

## 2019-04-20 DIAGNOSIS — E785 Hyperlipidemia, unspecified: Secondary | ICD-10-CM

## 2019-04-20 DIAGNOSIS — I129 Hypertensive chronic kidney disease with stage 1 through stage 4 chronic kidney disease, or unspecified chronic kidney disease: Secondary | ICD-10-CM

## 2019-04-20 DIAGNOSIS — Z794 Long term (current) use of insulin: Secondary | ICD-10-CM

## 2019-04-20 DIAGNOSIS — E118 Type 2 diabetes mellitus with unspecified complications: Secondary | ICD-10-CM

## 2019-04-20 DIAGNOSIS — E119 Type 2 diabetes mellitus without complications: Secondary | ICD-10-CM

## 2019-04-20 DIAGNOSIS — N183 Chronic kidney disease, stage 3 unspecified: Secondary | ICD-10-CM

## 2019-04-20 DIAGNOSIS — I10 Essential (primary) hypertension: Secondary | ICD-10-CM

## 2019-04-20 DIAGNOSIS — N1831 Chronic kidney disease, stage 3a: Secondary | ICD-10-CM

## 2019-04-20 DIAGNOSIS — K529 Noninfective gastroenteritis and colitis, unspecified: Secondary | ICD-10-CM

## 2019-04-20 DIAGNOSIS — Z79899 Other long term (current) drug therapy: Secondary | ICD-10-CM

## 2019-04-20 DIAGNOSIS — E1122 Type 2 diabetes mellitus with diabetic chronic kidney disease: Secondary | ICD-10-CM

## 2019-04-20 LAB — POCT GLYCOSYLATED HEMOGLOBIN (HGB A1C): Hemoglobin A1C: 7.8 % — AB (ref 4.0–5.6)

## 2019-04-20 LAB — GLUCOSE, CAPILLARY: Glucose-Capillary: 155 mg/dL — ABNORMAL HIGH (ref 70–99)

## 2019-04-20 MED ORDER — CLONIDINE HCL 0.3 MG PO TABS: 0.3000 mg | ORAL_TABLET | Freq: Two times a day (BID) | ORAL | 3 refills | Status: DC

## 2019-04-20 MED ORDER — HYDROCHLOROTHIAZIDE 25 MG PO TABS: 25.0000 mg | ORAL_TABLET | Freq: Every day | ORAL | 1 refills | Status: DC

## 2019-04-20 MED ORDER — INSULIN GLARGINE 100 UNIT/ML ~~LOC~~ SOLN: 24.0000 [IU] | Freq: Every day | SUBCUTANEOUS | 5 refills | Status: DC

## 2019-04-20 MED ORDER — CANAGLIFLOZIN 100 MG PO TABS: ORAL_TABLET | ORAL | 3 refills | Status: DC

## 2019-04-20 MED ORDER — INSULIN LISPRO (1 UNIT DIAL) 100 UNIT/ML (KWIKPEN): 3.0000 [IU] | PEN_INJECTOR | Freq: Three times a day (TID) | SUBCUTANEOUS | 5 refills | Status: DC

## 2019-04-20 MED ORDER — JANUMET 50-1000 MG PO TABS: 1.0000 | ORAL_TABLET | Freq: Two times a day (BID) | ORAL | 3 refills | Status: DC

## 2019-04-20 MED ORDER — CARVEDILOL 25 MG PO TABS: 25.0000 mg | ORAL_TABLET | Freq: Two times a day (BID) | ORAL | 3 refills | Status: DC

## 2019-04-20 MED ORDER — AMLODIPINE BESYLATE 10 MG PO TABS: 10.0000 mg | ORAL_TABLET | Freq: Every day | ORAL | 3 refills | Status: DC

## 2019-04-20 NOTE — Assessment & Plan Note (Signed)
  CKD IIIa: Borderline stage a/b.  Following with nephrology. She is scheduled to see urology for her hydronephrosis.

## 2019-04-20 NOTE — Assessment & Plan Note (Signed)
HLD: Screening for HLD. If elevated while on statin, will advise additional life style modifications and or medical therapy.

## 2019-04-20 NOTE — Assessment & Plan Note (Signed)
Colitis: Noted on colonoscopy and confirmed with biopsy on 11/09/2018.

## 2019-04-20 NOTE — Assessment & Plan Note (Signed)
Hypertension: Patient's BP today is 175/83 with a goal of <140/80. Patient checked her home machine today which demonstrated a pressure of 172/95. Her home readings average 120'/80's. The patient endorses adherence to her medication regimen. She denied, chest pain, headache, visual changes, lightheadedness, weakness, dizziness on standing, swelling in the feet or ankles.   Plan: Continue amlodipine 10mg  daily Continue clonidine 0.3mg  daily Continue HCTZ 25mg mg BID Continue Losartan 50mg  daily BMP today Continue to follow with nephrology

## 2019-04-20 NOTE — Assessment & Plan Note (Signed)
DMII: Hgb A1c 8.3 % at the last visit. Current A1c 7.8%. Goal is <7% The patient denied, polydipsia, headache, fatigue, confusion, nausea, vomiting or diaphoresis. She endorsed polyuria but admits to consuming high volumes of water daily. Patient continues to use her sliding scale insulin well. She has made great strides with her diet and it shows. I have encouraged her to continue this.   Plan: Continue lantus 24U daily Continue humalog 3-8U sliding scale TID Continue Invokana 100mg  daily Continue Janumet 50-1000mg  BID Repeat A1c at next visit

## 2019-04-20 NOTE — Patient Instructions (Signed)
FOLLOW-UP INSTRUCTIONS When: 3-4 months For: routine visit What to bring: All of your medications  I have not made any changes to your medications today. Please continue to take the medication as previously described and described in this handout.   You have made excellent improvement on you blood pressure and diabetes management. Keep up the good work.   Thank you for your visit to the [facility] today. If you have any questions or concerns please call us at [phone].

## 2019-04-21 LAB — BMP8+ANION GAP
Anion Gap: 15 mmol/L (ref 10.0–18.0)
BUN/Creatinine Ratio: 20 (ref 9–23)
BUN: 32 mg/dL — ABNORMAL HIGH (ref 6–24)
CO2: 24 mmol/L (ref 20–29)
Calcium: 10.3 mg/dL — ABNORMAL HIGH (ref 8.7–10.2)
Chloride: 100 mmol/L (ref 96–106)
Creatinine, Ser: 1.59 mg/dL — ABNORMAL HIGH (ref 0.57–1.00)
GFR calc Af Amer: 42 mL/min/{1.73_m2} — ABNORMAL LOW (ref >59)
GFR calc non Af Amer: 36 mL/min/{1.73_m2} — ABNORMAL LOW (ref >59)
Glucose: 149 mg/dL — ABNORMAL HIGH (ref 65–99)
Potassium: 4.2 mmol/L (ref 3.5–5.2)
Sodium: 139 mmol/L (ref 134–144)

## 2019-04-21 LAB — LIPID PANEL
Chol/HDL Ratio: 2.8 ratio (ref 0.0–4.4)
Cholesterol, Total: 91 mg/dL — ABNORMAL LOW (ref 100–199)
HDL: 33 mg/dL — ABNORMAL LOW (ref >39)
LDL Chol Calc (NIH): 37 mg/dL (ref 0–99)
Triglycerides: 117 mg/dL (ref 0–149)
VLDL Cholesterol Cal: 21 mg/dL (ref 5–40)

## 2019-04-22 ENCOUNTER — Other Ambulatory Visit: Payer: Self-pay | Admitting: Internal Medicine

## 2019-04-22 DIAGNOSIS — K219 Gastro-esophageal reflux disease without esophagitis: Secondary | ICD-10-CM

## 2019-04-22 DIAGNOSIS — N183 Chronic kidney disease, stage 3 unspecified: Secondary | ICD-10-CM

## 2019-04-22 DIAGNOSIS — I10 Essential (primary) hypertension: Secondary | ICD-10-CM

## 2019-04-22 DIAGNOSIS — E119 Type 2 diabetes mellitus without complications: Secondary | ICD-10-CM

## 2019-04-22 NOTE — Telephone Encounter (Signed)
Per patient [facility] no longer carries pantoprazole (PROTONIX) 40 MG tablet.  Is asking the doctor to please substitute with another medication that they will cover.

## 2019-04-24 NOTE — Progress Notes (Signed)
Internal Medicine Clinic Attending  Case discussed with Dr. Harbrecht at the time of the visit.  We reviewed the resident's history and exam and pertinent patient test results.  I agree with the assessment, diagnosis, and plan of care documented in the resident's note.   

## 2019-04-25 MED ORDER — OMEPRAZOLE 40 MG PO CPDR: 40.0000 mg | DELAYED_RELEASE_CAPSULE | Freq: Every day | ORAL | 3 refills | Status: DC

## 2019-04-25 NOTE — Telephone Encounter (Signed)
Discontinuing Aspirin.

## 2019-04-25 NOTE — Telephone Encounter (Signed)
Called pt - no answer; left message on self-identified vm that rx for Omeprazole has been sent to the pharmacy.

## 2019-04-25 NOTE — Telephone Encounter (Signed)
It looks like they have omeprazole on their website. I have substituted for this and filled a new script. Thank you

## 2019-04-27 ENCOUNTER — Other Ambulatory Visit: Payer: Self-pay | Admitting: Internal Medicine

## 2019-04-27 DIAGNOSIS — I10 Essential (primary) hypertension: Secondary | ICD-10-CM

## 2019-04-27 MED ORDER — ASPIRIN 81 MG PO TBEC: 81.0000 mg | DELAYED_RELEASE_TABLET | Freq: Every day | ORAL | 3 refills | Status: DC

## 2019-04-27 NOTE — Telephone Encounter (Signed)
Refill Request   ASPIRIN ADULT LOW STRENGTH 81 MG EC tablet   [facility] [place], [place] - [address], [address]

## 2019-05-25 ENCOUNTER — Telehealth: Payer: Self-pay | Admitting: Dietician

## 2019-05-25 DIAGNOSIS — E119 Type 2 diabetes mellitus without complications: Secondary | ICD-10-CM

## 2019-05-25 NOTE — Telephone Encounter (Signed)
Ashley Long agreed to professional Continuous glucose monitoring Request referral.

## 2019-05-26 ENCOUNTER — Ambulatory Visit: Payer: No Typology Code available for payment source | Attending: Family

## 2019-05-26 DIAGNOSIS — Z23 Encounter for immunization: Secondary | ICD-10-CM

## 2019-05-26 NOTE — Progress Notes (Signed)
   Covid-19 Vaccination Clinic  Name:  Ashley Long    MRN: [id number] DOB: [birth year]  05/26/2019  [name] was observed post Covid-19 immunization for 15 minutes without incident. She was provided with Vaccine Information Sheet and instruction to access the V-Safe system.   [name] was instructed to call 911 with any severe reactions post vaccine: Marland Kitchen Difficulty breathing  . Swelling of face and throat  . A fast heartbeat  . A bad rash all over body  . Dizziness and weakness   Immunizations Administered    Name Date Dose VIS Date Route   Moderna COVID-19 Vaccine 05/26/2019  3:55 PM 0.5 mL 02/15/2019 Intramuscular   Manufacturer: Moderna   Lot: [id number]   [id number]

## 2019-05-30 ENCOUNTER — Telehealth: Payer: Self-pay | Admitting: Internal Medicine

## 2019-05-30 DIAGNOSIS — E119 Type 2 diabetes mellitus without complications: Secondary | ICD-10-CM

## 2019-05-30 NOTE — Telephone Encounter (Signed)
Pt is calling reference to a new Eye Referral Request.  This is an [facility] patietn who is willing to pay out of pocket to be seen by an Eye Dr.  Please advise if an appointment is needed for this referral Request.

## 2019-06-01 ENCOUNTER — Encounter: Payer: Self-pay | Admitting: Dietician

## 2019-06-01 ENCOUNTER — Ambulatory Visit: Payer: Self-pay | Admitting: Dietician

## 2019-06-01 NOTE — Patient Instructions (Signed)
Please record the time, amount and what food drinks and activities you have while wearing the continuous glucose monitor (CGM).  Bring the folder with you to follow up appointments. If your monitor falls off, please place it in the bag provided in your folder and bring it back with you to your next appointment.   Do not have a CT or an MRI while wearing the CGM.   Follow up: 1 week visit with me and a doctor for the first of two CGM downloads.   You will also return in 2 weeks to have your second download and the CGM removed.  [name], RD 06/01/2019 2:22 PM

## 2019-06-01 NOTE — Telephone Encounter (Signed)
Referral placed to Ophthalmology. Please assist patient with obtaining appointment.

## 2019-06-01 NOTE — Progress Notes (Signed)
Documentation for Freestyle Libre Pro Continuous glucose monitoring Freestyle Libre Pro CGM sensor placed today. Patient was educated about wearing sensor, keeping food, activity and medication log and when to call office. Patient was educated about how to care for the sensor and not to have an MRI, CT or Diathermy while wearing the sensor. Follow up was arranged with the patient for 1 week.   Lot #: [id number] Serial #: [id number] Expiration Date: 08.31.2021  Jarvis Morgan 06/01/2019 2:40 PM.

## 2019-06-03 ENCOUNTER — Telehealth: Payer: Self-pay | Admitting: Dietician

## 2019-06-03 NOTE — Telephone Encounter (Addendum)
Betta called to report a blood sugar of 47 before lunch and after taking 5 units Humalog this am for a breakfast of frosted flakes. She treated it and on recheck her sugar was 76 she took 2 units of Humalog for her lunch. She rechecked her blood sugar and it was 126. We discussed she may need adjustment of her insulin doses cine  Her weight continue to decrease. She agreed to lower her breakfast and lunch doses to no higher than 4 units until we meet next week for CGM download 1. She reports not having any symptoms of this or some of there other low blood sugars  Wt Readings from Last 10 Encounters:  06/01/19 228 lb 1.6 oz (103.5 kg)  04/20/19 231 lb 3.2 oz (104.9 kg)  11/08/18 242 lb (109.8 kg)  11/03/18 242 lb 8 oz (110 kg)  08/11/18 245 lb 14.4 oz (111.5 kg)  05/03/18 240 lb (108.9 kg)  03/03/18 242 lb 14.4 oz (110.2 kg)  01/20/18 241 lb 11.2 oz (109.6 kg)  01/20/18 241 lb 11.2 oz (109.6 kg)  12/02/17 243 lb 9.6 oz (110.5 kg)

## 2019-06-07 ENCOUNTER — Telehealth: Payer: Self-pay | Admitting: *Deleted

## 2019-06-07 NOTE — Telephone Encounter (Signed)
LVM FOR PATIENT TO RETURN CALL TO CLINIC REGARDING [facility] EYE REFERRAL / COPAY  $65.00.

## 2019-06-08 ENCOUNTER — Ambulatory Visit (INDEPENDENT_AMBULATORY_CARE_PROVIDER_SITE_OTHER): Payer: Self-pay | Admitting: Dietician

## 2019-06-08 ENCOUNTER — Encounter: Payer: Self-pay | Admitting: Dietician

## 2019-06-08 ENCOUNTER — Encounter: Payer: Self-pay | Admitting: Internal Medicine

## 2019-06-08 ENCOUNTER — Ambulatory Visit (INDEPENDENT_AMBULATORY_CARE_PROVIDER_SITE_OTHER): Payer: Self-pay | Admitting: Internal Medicine

## 2019-06-08 VITALS — BP 120/80 | HR 81 | Temp 98.8°F | Ht 68.0 in | Wt 228.7 lb

## 2019-06-08 DIAGNOSIS — N189 Chronic kidney disease, unspecified: Secondary | ICD-10-CM

## 2019-06-08 DIAGNOSIS — Z79899 Other long term (current) drug therapy: Secondary | ICD-10-CM

## 2019-06-08 DIAGNOSIS — Z6834 Body mass index (BMI) 34.0-34.9, adult: Secondary | ICD-10-CM

## 2019-06-08 DIAGNOSIS — E119 Type 2 diabetes mellitus without complications: Secondary | ICD-10-CM

## 2019-06-08 DIAGNOSIS — Z794 Long term (current) use of insulin: Secondary | ICD-10-CM

## 2019-06-08 DIAGNOSIS — I129 Hypertensive chronic kidney disease with stage 1 through stage 4 chronic kidney disease, or unspecified chronic kidney disease: Secondary | ICD-10-CM

## 2019-06-08 DIAGNOSIS — I10 Essential (primary) hypertension: Secondary | ICD-10-CM

## 2019-06-08 DIAGNOSIS — E1122 Type 2 diabetes mellitus with diabetic chronic kidney disease: Secondary | ICD-10-CM

## 2019-06-08 DIAGNOSIS — Z713 Dietary counseling and surveillance: Secondary | ICD-10-CM

## 2019-06-08 MED ORDER — BASAGLAR KWIKPEN 100 UNIT/ML ~~LOC~~ SOPN: 12.0000 [IU] | PEN_INJECTOR | Freq: Two times a day (BID) | SUBCUTANEOUS | 3 refills | Status: DC

## 2019-06-08 NOTE — Assessment & Plan Note (Signed)
DMII: Ronaldo Miyamoto wore the CGM for 7 days. The average reading was 145, % time in target was 71, % time below target was 6, and % time above target was 23. Intervention will be to split her 24U of long acting insulin into 12U BID. She will also carb count . The patient will be scheduled to see [name] and me for a final appointment.  The patient denied polyuria, polydipsia, headache, fatigue, confusion, nausea, vomiting or diaphoresis.   Plan: Continue Janumet 50-1000mg  BID Continue Canagliflozin 100mg  Continue glargine 12U BID Continue meal time insulin 3-8U TID per carb count Repeat A1c at next visit

## 2019-06-08 NOTE — Progress Notes (Signed)
   CC: HTN, DMII and CKD  HPI:[name] is a 59 y.o. female who presents for evaluation of HTN, DMII and CKD. Please see individual problem based A/P for details.  Depression, PHQ-9: Based on the patients    Office Visit from 06/08/2019 in [place]  PHQ-9 Total Score  0     score we have decided to monitor.  Past Medical History:  Diagnosis Date  . AKI (acute kidney injury) ([place]) 07/17/2017  . Chronic kidney disease (CKD), stage III (moderate)   . DIABETES MELLITUS, TYPE II 06/02/2006   Last HBA1C 8.7. , check 3 times daily  . Hydronephrosis    sees Dr Royce Macadamia at [facility] ,  . HYPERLIPIDEMIA 06/02/2006   Qualifier: Diagnosis of  By: Patrice Paradise MD, Roderic Palau    . HYPERTENSION 06/02/2006   Filed Vitals:   01/21/11 1134  BP: 162/98  Pulse: 80  Temp: 97.4 F (36.3 C)  TempSrc: Oral  Resp: 20  Height: 5\' 8"  (1.727 m)  Weight: 261 lb (118.389 kg)     . Injury   . Stage 2 chronic kidney disease 12/02/2017   now stage 3   . Wound of right leg    right abover ankle, patient report sshe sustained a week ago from today  (11-08-2018) after hitting her leg on an object , reports wound is scabbed over and her pcp checked it out [facility]  some days ago and was not converned, patient denies redness or pain at sit eof wound    Review of Systems:  ROS negative except as per HPI.  Physical Exam: Vitals:   06/08/19 1555  BP: 120/80  Pulse: 81  Temp: 98.8 F (37.1 C)  TempSrc: Oral  SpO2: 100%  Weight: 228 lb 11.2 oz (103.7 kg)  Height: 5\' 8"  (1.727 m)   Filed Weights   06/08/19 1555  Weight: 228 lb 11.2 oz (103.7 kg)   General: A/O x4, in no acute distress, afebrile, nondiaphoretic HEENT: PEERL, EMO intact Cardio: RRR, no mrg's  Pulmonary: CTA bilaterally, no wheezing or crackles  Abdomen: Bowel sounds normal, soft, nontender  MSK: BLE nontender, nonedematous Psych: Appropriate affect, not depressed in appearance, engages well  Assessment & Plan:    See Encounters Tab for problem based charting.  Patient discussed with Dr. Philipp Ovens

## 2019-06-08 NOTE — Progress Notes (Signed)
  Medical Nutrition Therapy :  Appt start time: 1400 end time:  [id number]. Total time: 90 Visit # 1  06/08/2019 TALISHA LULLO [id number]  Assessment:  Ms. Claridge wore the CGM and kept very good records of her food and insulin doses for the past week. This showed some hypoglycemia and hyperglycemia caused by a mismatch of insulin to her  Carbohydrate intake. This was especially evident at breakfast and on one weekend day when she ate out all three meals  Preferred Learning Style: Auditory; Visual; Hands on Learning Readiness: Change in progress  ANTHROPOMETRICS: Estimated body mass index is 34.77 kg/m as calculated from the following:   Height as of an earlier encounter on 06/08/19: 5\' 8"  (1.727 m).   Weight as of an earlier encounter on 06/08/19: 228 lb 11.2 oz (103.7 kg).  WEIGHT HISTORY:. Wt Readings from Last 10 Encounters:  06/08/19 228 lb 11.2 oz (103.7 kg)  06/01/19 228 lb 1.6 oz (103.5 kg)  04/20/19 231 lb 3.2 oz (104.9 kg)  11/08/18 242 lb (109.8 kg)  11/03/18 242 lb 8 oz (110 kg)  08/11/18 245 lb 14.4 oz (111.5 kg)  05/03/18 240 lb (108.9 kg)  03/03/18 242 lb 14.4 oz (110.2 kg)  01/20/18 241 lb 11.2 oz (109.6 kg)  01/20/18 241 lb 11.2 oz (109.6 kg)    SLEEP:reports no problems  MEDICATIONS: 24 lantus at bedtime BLOOD SUGAR: Her CGM report today showed spikes after breakfast and some lows occurring from mismatched insulin to carbs. Her average for the past 7 days was 145, her GMI was 6.8% Lab Results  Component Value Date   HGBA1C 7.8 (A) 04/20/2019   HGBA1C 8.3 (A) 11/03/2018   HGBA1C 8.3 (A) 08/11/2018   HGBA1C 7.7 (A) 03/03/2018   HGBA1C 7.8 (A) 12/02/2017     DIETARY INTAKE: Usual eating pattern includes 3 meals and 1-2 snacks per day.  Avoided foods include limits milk Dining Out (times/week): 3x 24-hr recall:  B ( AM): flavored instant oatmeal with sugar added or frosted flakes or honey oats with minimal milk/pancakes with syrup or bacon egg sandwich L ( PM):  Tuna or [place] sandwich Snk ( PM): sometimes fruit or peanut butter crackers D ( PM): salad with [place] or chicken fried rice or a sandwich Snk ( PM): sometimes an apple or peanut butter crackers Beverages: water   Progress Towards Goal(s):  Some progress.   Nutritional Diagnosis:  NI-5.8.4 Inconsistent carbohydrate intake As related to food and nutrition-related knowledge deficit.  As evidenced by her report, food records and CGM results.    Intervention:  Nutrition education about importance of carb consistent meals. Educated/reveiwe label reading and carb counting skills.  Action Goal:try to keep meals carb consistent   Outcome goal: improved blood sugars Coordination of care: discussed CGM results and plan from nutrition perspective with dr. Berline Lopes   Teaching Method Utilized: Visual, Auditory,Hands on Handouts given during visit include: After visit summary Barriers to learning/adherence to lifestyle change:competing values Demonstrated degree of understanding via:  Teach Back   Monitoring/Evaluation:  Dietary intake, exercise, meter, and body weight in 1 week(s)  [name], RD 06/08/2019 4:58 PM. .

## 2019-06-08 NOTE — Assessment & Plan Note (Signed)
Hypertension: Patient's BP today is 120/80 with a goal of <140/80. The patient endorses adherence to her medication regimen. She denied, chest pain, headache, visual changes, lightheadedness, weakness, dizziness on standing, swelling in the feet or ankles.   Plan: Continue Amlodipine 10mg  daily Continue carvedilol 25mg  daily Continue clonidine 0.3mg  BID Continue HCTZ 25mg  dailiy Continue Losartan 50mg  daily Continue canagliflozin 100mg  daily  BMP today

## 2019-06-08 NOTE — Patient Instructions (Addendum)
Please return in 7 days with Ashley Long and myself. for Download #2.    Please split your Basaglar insulin into two doses of 12U each about 12 hours apart. Please make the additional carb counting adjustments that Ashley Long spoke to you about. If you have any questions or concerns please let us know.  It was good seeing you again today.

## 2019-06-08 NOTE — Patient Instructions (Addendum)
Breakfast carb goal 44-50 grams of carb- this is so you can take a consistent amount of mealtime insulin 4 units  Examples are:  1 1/4 cup  Honey nut cheerios and milk 1 cup frosted flakes and milk 2 slices toast [id number] grams, bacon and egg- the fat and protein in this may help keep blood sugar up or you could put a thin amount of jelly on your toast to bring it up to 40 grams 1 cup shredded wheat plus milk is 6 that is 46  Low sugar instant 2 packs (48 grams)   IF you eat a meal (like a salad with [place] for dinner and 5 crackers) that is only 24-30 grams carb then only take 2 units of meal time insulin  In a nutshell-  you need about 1 unit of meal time insulin for every 12 grams of carbohydrate you eat.   [name] [phone]

## 2019-06-09 LAB — BMP8+ANION GAP
Anion Gap: 17 mmol/L (ref 10.0–18.0)
BUN/Creatinine Ratio: 28 — ABNORMAL HIGH (ref 9–23)
BUN: 40 mg/dL — ABNORMAL HIGH (ref 6–24)
CO2: 22 mmol/L (ref 20–29)
Calcium: 10.4 mg/dL — ABNORMAL HIGH (ref 8.7–10.2)
Chloride: 99 mmol/L (ref 96–106)
Creatinine, Ser: 1.41 mg/dL — ABNORMAL HIGH (ref 0.57–1.00)
GFR calc Af Amer: 48 mL/min/{1.73_m2} — ABNORMAL LOW (ref >59)
GFR calc non Af Amer: 42 mL/min/{1.73_m2} — ABNORMAL LOW (ref >59)
Glucose: 181 mg/dL — ABNORMAL HIGH (ref 65–99)
Potassium: 4.3 mmol/L (ref 3.5–5.2)
Sodium: 138 mmol/L (ref 134–144)

## 2019-06-09 NOTE — Progress Notes (Signed)
Internal Medicine Clinic Attending  Case discussed with Dr. Harbrecht at the time of the visit.  We reviewed the resident's history and exam and pertinent patient test results.  I agree with the assessment, diagnosis, and plan of care documented in the resident's note.   

## 2019-06-14 ENCOUNTER — Telehealth: Payer: Self-pay | Admitting: Internal Medicine

## 2019-06-14 NOTE — Telephone Encounter (Signed)
Called and updated patient on her labs. All questions answered.

## 2019-06-15 ENCOUNTER — Encounter: Payer: Self-pay | Admitting: Dietician

## 2019-06-15 ENCOUNTER — Ambulatory Visit (INDEPENDENT_AMBULATORY_CARE_PROVIDER_SITE_OTHER): Payer: Self-pay | Admitting: Dietician

## 2019-06-15 ENCOUNTER — Ambulatory Visit: Payer: Self-pay | Admitting: Internal Medicine

## 2019-06-15 ENCOUNTER — Encounter: Payer: Self-pay | Admitting: Internal Medicine

## 2019-06-15 DIAGNOSIS — E119 Type 2 diabetes mellitus without complications: Secondary | ICD-10-CM

## 2019-06-15 DIAGNOSIS — Z713 Dietary counseling and surveillance: Secondary | ICD-10-CM

## 2019-06-15 MED ORDER — CANAGLIFLOZIN 300 MG PO TABS: ORAL_TABLET | ORAL | 1 refills | Status: DC

## 2019-06-15 NOTE — Progress Notes (Signed)
  Medical Nutrition Therapy :  Appt start time: 1400 end time:  1500. Total time: 60 Visit # 2  06/15/2019 Ashley Long [id number]  Assessment:  [name] wore the CGM for 14 days. She forgot her records of her food and insulin doses for the past week.  She did not have any hypoglycemia, there appeared to be less of a dip overnight and she used meal planning to decrease her after breakfast spike.   ANTHROPOMETRICS: Estimated body mass index is 34.77 kg/m as calculated from the following:   Height as of 06/08/19: 5\' 8"  (1.727 m).   Weight as of 06/08/19: 228 lb 11.2 oz (103.7 kg).  MEDICATIONS: 24 lantus at bedtime;   BLOOD SUGAR: . Her average for the past 14 days was 151, her GMI was 6.9%  DIETARY INTAKE: Measured shredded wheat, bought and ate lower sugar oatmeal both helping her after breakfast spike in blood sugar   Progress Towards Goal(s):  Some progress.   Nutritional Diagnosis:  NI-5.8.4 Inconsistent carbohydrate intake As related to food and nutrition-related knowledge deficit improving  As evidenced by her report and CGM results.    Intervention:  Nutrition education about importance of carb consistent meals. Educated/reveiwe label reading and carb counting skills.  Action Goal:try to keep meals carb consistent   Outcome goal: improved blood sugars Coordination of care: discussed CGM results and plan from nutrition perspective with Dr. Berline Lopes   Teaching Method Utilized: Visual, Auditory,Hands on Handouts given during visit include: After visit summary Barriers to learning/adherence to lifestyle change:competing values Demonstrated degree of understanding via:  Teach Back   Monitoring/Evaluation:  Dietary intake, exercise, meter, and body weight in 3 month(s)  [name], RD 06/15/2019 3:02 PM. .

## 2019-06-15 NOTE — Assessment & Plan Note (Signed)
DMII: The patient denied polyuria, polydipsia, headache, fatigue, confusion, nausea, vomiting or diaphoresis.  Ashley Long wore the CGM for 14 days. The average reading was 151, % time in target was 72, % time below target was 3, and % time above target was 25. Intervention will be to Increase Canagliflozin to 300mg  daily and to continue carb counting as she has been. The patient will be scheduled to see her PCP for a routine appointment.   Plan: Continue Lantus 12 units BID Continue humalog insulin 3-8 units TID Continue Canagliflozin increased to 300mg  daily Continue Janumet 50-1000mg  BID Continue atorvastatin and losartan Repeat A1c at next visit

## 2019-06-15 NOTE — Patient Instructions (Signed)
FOLLOW-UP INSTRUCTIONS When: May 12 with your PCP What to bring: All of your medications  Today we discussed your diabetes. I recommend increasing the Invokana to 300mg  daily, continuing carb counting and insulin as you have been.   Thank you for your visit to the Ashley Long [facility] today. If you have any questions or concerns please call us at [phone].

## 2019-06-15 NOTE — Progress Notes (Signed)
   CC: DMII   HPI:AshleyAshley Long is a 59 y.o. female who presents for evaluation of DMII and DMII. Please see individual problem based A/P for details.  Depression, PHQ-9: Based on the patients    Office Visit from 06/08/2019 in [place]  PHQ-9 Total Score  0     score we have decided to monitor.  Past Medical History:  Diagnosis Date  . AKI (acute kidney injury) ([place]) 07/17/2017  . Chronic kidney disease (CKD), stage III (moderate)   . DIABETES MELLITUS, TYPE II 06/02/2006   Last HBA1C 8.7. , check 3 times daily  . Hydronephrosis    sees Dr Royce Macadamia at [facility] ,  . HYPERLIPIDEMIA 06/02/2006   Qualifier: Diagnosis of  By: Patrice Paradise MD, Roderic Palau    . HYPERTENSION 06/02/2006   Filed Vitals:   01/21/11 1134  BP: 162/98  Pulse: 80  Temp: 97.4 F (36.3 C)  TempSrc: Oral  Resp: 20  Height: 5\' 8"  (1.727 m)  Weight: 261 lb (118.389 kg)     . Injury   . Stage 2 chronic kidney disease 12/02/2017   now stage 3   . Wound of right leg    right abover ankle, patient report sshe sustained a week ago from today  (11-08-2018) after hitting her leg on an object , reports wound is scabbed over and her pcp checked it out [facility]  some days ago and was not converned, patient denies redness or pain at sit eof wound    Review of Systems:  ROS negative except as per HPI.   Physical Exam: Vitals:   06/15/19 1505  BP: (!) 176/84  Pulse: 72  Temp: 98.3 F (36.8 C)  TempSrc: Oral  SpO2: 100%  Weight: 229 lb 14.4 oz (104.3 kg)  Height: 5\' 8"  (1.727 m)   Filed Weights   06/15/19 1505  Weight: 229 lb 14.4 oz (104.3 kg)   General: A/O x4, in no acute distress, afebrile, nondiaphoretic HEENT: PEERL, EMO intact Psych: Appropriate affect, not depressed in appearance, engages well  Assessment & Plan:   See Encounters Tab for problem based charting.  Patient discussed with Dr. Dareen Piano

## 2019-06-15 NOTE — Patient Instructions (Addendum)
Ashley Long- great job on the Continuous glucose monitoring Records and keeping your carb intake in range!  Happy belated birthday!  New scale for correcting your blood sugar:   Check blood sugar before each meal. If 70-99     inject 3 units If 100-140 inject 4 units (this is your target range)     141-180 inject 5 units    181-220 inject 6 units    221-260 inject 7 units    261-300 inject 8 units   310-340 inject 9 units   More than 340 inject 10 units  Can wear Continuous glucose monitor again in 3 months (about June- July) if you and Dr. Berline Lopes think it helpful.   I suggest we follow up then as well either way.   [name] [phone]

## 2019-06-20 NOTE — Progress Notes (Signed)
Internal Medicine Clinic Attending  Case discussed with Dr. Harbrecht at the time of the visit.  We reviewed the resident's history and exam and pertinent patient test results.  I agree with the assessment, diagnosis, and plan of care documented in the resident's note.   

## 2019-06-28 ENCOUNTER — Ambulatory Visit: Payer: No Typology Code available for payment source | Attending: Internal Medicine

## 2019-06-28 ENCOUNTER — Other Ambulatory Visit: Payer: Self-pay

## 2019-06-28 DIAGNOSIS — Z23 Encounter for immunization: Secondary | ICD-10-CM

## 2019-06-28 NOTE — Telephone Encounter (Signed)
Pt states she still have not received   canagliflozin (INVOKANA) 300 MG TABS tablet   From [facility] pharmacy. Please call pt back.

## 2019-06-28 NOTE — Telephone Encounter (Signed)
Called med assist, pharmacist states pt has not requested a refill. Called pt, she stated she thought dr Berline Lopes called for her. Explained that no it is her responsibility to call med assist and request med refills. She is agreeable

## 2019-07-18 ENCOUNTER — Encounter: Payer: Self-pay | Admitting: *Deleted

## 2019-07-20 ENCOUNTER — Telehealth: Payer: Self-pay | Admitting: *Deleted

## 2019-07-27 ENCOUNTER — Ambulatory Visit (INDEPENDENT_AMBULATORY_CARE_PROVIDER_SITE_OTHER): Payer: Self-pay | Admitting: Internal Medicine

## 2019-07-27 ENCOUNTER — Encounter: Payer: Self-pay | Admitting: Internal Medicine

## 2019-07-27 VITALS — BP 120/80 | HR 71 | Temp 97.9°F | Ht 68.0 in | Wt 229.0 lb

## 2019-07-27 DIAGNOSIS — E119 Type 2 diabetes mellitus without complications: Secondary | ICD-10-CM

## 2019-07-27 DIAGNOSIS — I10 Essential (primary) hypertension: Secondary | ICD-10-CM

## 2019-07-27 DIAGNOSIS — E1122 Type 2 diabetes mellitus with diabetic chronic kidney disease: Secondary | ICD-10-CM

## 2019-07-27 DIAGNOSIS — Z794 Long term (current) use of insulin: Secondary | ICD-10-CM

## 2019-07-27 LAB — POCT GLYCOSYLATED HEMOGLOBIN (HGB A1C): Hemoglobin A1C: 7.9 % — AB (ref 4.0–5.6)

## 2019-07-27 LAB — GLUCOSE, CAPILLARY: Glucose-Capillary: 170 mg/dL — ABNORMAL HIGH (ref 70–99)

## 2019-07-27 MED ORDER — BASAGLAR KWIKPEN 100 UNIT/ML ~~LOC~~ SOPN: 14.0000 [IU] | PEN_INJECTOR | Freq: Two times a day (BID) | SUBCUTANEOUS | 3 refills | Status: DC

## 2019-07-27 NOTE — Progress Notes (Signed)
   CC: HTN and DMII  HPI:Ashley Long is a 59 y.o. female who presents for evaluation of HTN and DMII. Please see individual problem based A/P for details.  Depression, PHQ-9: Based on the patients    Office Visit from 07/27/2019 in [place]  PHQ-9 Total Score  0     score we have decided to monitor.  Past Medical History:  Diagnosis Date  . AKI (acute kidney injury) ([place]) 07/17/2017  . Chronic kidney disease (CKD), stage III (moderate)   . DIABETES MELLITUS, TYPE II 06/02/2006   Last HBA1C 8.7. , check 3 times daily  . Hydronephrosis    sees Dr Royce Macadamia at [facility] ,  . HYPERLIPIDEMIA 06/02/2006   Qualifier: Diagnosis of  By: Patrice Paradise MD, Roderic Palau    . HYPERTENSION 06/02/2006   Filed Vitals:   01/21/11 1134  BP: 162/98  Pulse: 80  Temp: 97.4 F (36.3 C)  TempSrc: Oral  Resp: 20  Height: 5\' 8"  (1.727 m)  Weight: 261 lb (118.389 kg)     . Injury   . Stage 2 chronic kidney disease 12/02/2017   now stage 3   . Wound of right leg    right abover ankle, patient report sshe sustained a week ago from today  (11-08-2018) after hitting her leg on an object , reports wound is scabbed over and her pcp checked it out at [facility]  some days ago and was not converned, patient denies redness or pain at sit eof wound    Review of Systems:  ROS negative except as per HPI.  Physical Exam: Vitals:   07/27/19 1418  BP: 120/80  Pulse: 71  Temp: 97.9 F (36.6 C)  TempSrc: Oral  SpO2: 100%  Weight: 229 lb (103.9 kg)  Height: 5\' 8"  (1.727 m)   Filed Weights   07/27/19 1418  Weight: 229 lb (103.9 kg)   General: A/O x4, in no acute distress, afebrile, nondiaphoretic HEENT: PEERL, EMO intact Cardio: RRR, no mrg's  Pulmonary: CTA bilaterally, no wheezing or crackles  Abdomen: Bowel sounds normal, soft, nontender  MSK: BLE nontender, nonedematous Neuro: Alert, CNII-XII grossly intact, conversational, strength 5/5 in the upper and lower extremities bilaterally,  normal gait Psych: Appropriate affect, not depressed in appearance, engages well  Assessment & Plan:   See Encounters Tab for problem based charting.  Patient discussed with Dr. Angelia Mould

## 2019-07-27 NOTE — Patient Instructions (Addendum)
FOLLOW-UP INSTRUCTIONS When: 3-4 months For: Routine appointment What to bring: All of your medications  I have made a minor change to your medications today. Please increase lantus insulin to 14 units two times daily form 12 units two times daily.   Please keep taking your high blood pressure medicine as this has greatly improved.  Please continue taking your other diabetes medications as well.  Thank you for your visit to the [facility] today. If you have any questions or concerns please call us at [phone].

## 2019-07-27 NOTE — Assessment & Plan Note (Signed)
Hypertension: Patient's BP today is 120/80 with a goal of <140/80. The patient endorses adherence to her medication regimen. She denied, chest pain, headache, visual changes, lightheadedness, weakness, dizziness on standing, swelling in the feet or ankles. BMP previously reviewed demonstrating stable renal function and potassium.  Plan: Continue Amlodipine 10mg  daily Continue carvedilol 25mg  daily Continue clonidine 0.3mg  BID Continue HCTZ 25mg  dailiy Continue Losartan 50mg  daily Continue canagliflozin 300mg  daily  BMP at next visit

## 2019-07-27 NOTE — Assessment & Plan Note (Signed)
DMII: Hgb A1c 7.8 % at the last visit. Current A1c 7.9% which is above goal of 7%. The patient denied polyuria, polydipsia, headache, fatigue, confusion, nausea, vomiting or diaphoresis. She started taking the Canagliflozin a the increased dose of 300mg  since our last visit. Consider GLP1 in leu of DPP4 if better glycemic control needed given cardiac protective benefits as well  Plan: Continue Lantus but increased to 14 units BID Continue humalog insulin 3-8 units TID Continue Canagliflozin 300mg  daily Continue Janumet 50-1000mg  BID Continue atorvastatin and losartan Repeat A1c at next visit if >3 months

## 2019-08-02 NOTE — Progress Notes (Signed)
Internal Medicine Clinic Attending  Case discussed with Dr. Harbrecht at the time of the visit.  We reviewed the resident's history and exam and pertinent patient test results.  I agree with the assessment, diagnosis, and plan of care documented in the resident's note.   

## 2019-08-10 ENCOUNTER — Encounter: Payer: No Typology Code available for payment source | Admitting: Internal Medicine

## 2019-08-17 ENCOUNTER — Ambulatory Visit: Payer: No Typology Code available for payment source

## 2019-08-17 ENCOUNTER — Other Ambulatory Visit: Payer: Self-pay

## 2019-09-08 LAB — HM DIABETES EYE EXAM

## 2019-09-16 ENCOUNTER — Encounter: Payer: Self-pay | Admitting: *Deleted

## 2019-10-07 ENCOUNTER — Telehealth: Payer: Self-pay

## 2019-10-07 NOTE — Telephone Encounter (Signed)
Telephoned patient at home number. Left a voice message with [facility] contact information. 

## 2019-10-12 ENCOUNTER — Other Ambulatory Visit: Payer: Self-pay

## 2019-10-12 DIAGNOSIS — Z1231 Encounter for screening mammogram for malignant neoplasm of breast: Secondary | ICD-10-CM

## 2019-10-18 ENCOUNTER — Other Ambulatory Visit: Payer: Self-pay | Admitting: *Deleted

## 2019-10-18 DIAGNOSIS — I10 Essential (primary) hypertension: Secondary | ICD-10-CM

## 2019-10-18 DIAGNOSIS — E119 Type 2 diabetes mellitus without complications: Secondary | ICD-10-CM

## 2019-10-18 MED ORDER — BASAGLAR KWIKPEN 100 UNIT/ML ~~LOC~~ SOPN: 14.0000 [IU] | PEN_INJECTOR | Freq: Two times a day (BID) | SUBCUTANEOUS | 5 refills | Status: DC

## 2019-10-18 MED ORDER — CANAGLIFLOZIN 300 MG PO TABS: ORAL_TABLET | ORAL | 5 refills | Status: DC

## 2019-10-18 MED ORDER — HYDROCHLOROTHIAZIDE 25 MG PO TABS: 25.0000 mg | ORAL_TABLET | Freq: Every day | ORAL | 5 refills | Status: DC

## 2019-11-01 ENCOUNTER — Other Ambulatory Visit: Payer: Self-pay

## 2019-11-01 ENCOUNTER — Ambulatory Visit
Admission: RE | Admit: 2019-11-01 | Discharge: 2019-11-01 | Disposition: A | Discharge status: Home / Self Care | Payer: No Typology Code available for payment source | Source: Ambulatory Visit | Attending: Internal Medicine | Admitting: Internal Medicine

## 2019-11-01 DIAGNOSIS — Z1231 Encounter for screening mammogram for malignant neoplasm of breast: Secondary | ICD-10-CM

## 2019-12-21 ENCOUNTER — Telehealth: Payer: Self-pay | Admitting: *Deleted

## 2019-12-21 ENCOUNTER — Ambulatory Visit (INDEPENDENT_AMBULATORY_CARE_PROVIDER_SITE_OTHER): Payer: Self-pay | Admitting: Internal Medicine

## 2019-12-21 ENCOUNTER — Other Ambulatory Visit: Payer: Self-pay

## 2019-12-21 ENCOUNTER — Encounter: Payer: Self-pay | Admitting: Internal Medicine

## 2019-12-21 VITALS — BP 174/99 | HR 77 | Temp 98.2°F | Ht 68.0 in | Wt 230.0 lb

## 2019-12-21 DIAGNOSIS — N1832 Chronic kidney disease, stage 3b: Secondary | ICD-10-CM

## 2019-12-21 DIAGNOSIS — I129 Hypertensive chronic kidney disease with stage 1 through stage 4 chronic kidney disease, or unspecified chronic kidney disease: Secondary | ICD-10-CM

## 2019-12-21 DIAGNOSIS — Z299 Encounter for prophylactic measures, unspecified: Secondary | ICD-10-CM

## 2019-12-21 DIAGNOSIS — E1122 Type 2 diabetes mellitus with diabetic chronic kidney disease: Secondary | ICD-10-CM

## 2019-12-21 DIAGNOSIS — I10 Essential (primary) hypertension: Secondary | ICD-10-CM

## 2019-12-21 DIAGNOSIS — Z23 Encounter for immunization: Secondary | ICD-10-CM

## 2019-12-21 DIAGNOSIS — Z794 Long term (current) use of insulin: Secondary | ICD-10-CM

## 2019-12-21 LAB — POCT GLYCOSYLATED HEMOGLOBIN (HGB A1C): Hemoglobin A1C: 7.5 % — AB (ref 4.0–5.6)

## 2019-12-21 LAB — GLUCOSE, CAPILLARY: Glucose-Capillary: 157 mg/dL — ABNORMAL HIGH (ref 70–99)

## 2019-12-21 NOTE — Patient Instructions (Signed)
Thank you, Ashley Long for allowing [place] to provide your care today. Today we discussed hypertension and diabetes.    I have ordered the following labs for you:   Lab Orders     Glucose, capillary     POC Hbg A1C  I will follow up your labs from Dr. Rubye Beach office.   I will call if any are abnormal. All of your labs can be accessed through "My Chart".  I have place a referrals to none.  I have ordered the following tests: none   I have ordered the following medication/changed the following medications:  1. No changes to your medications.   Please follow-up in 3 months.  Should you have any questions or concerns please call the internal medicine clinic at [phone].    Marianna Payment, D.O. [facility]   My Chart Access: [email or web address]   If you have not already done so, please get your COVID 19 vaccine  To schedule an appointment for a COVID vaccine choice any of the following: Go to [email or web address]   Go to [email or web address]                  Call [phone]                                     Call [phone] and select Option 2

## 2019-12-21 NOTE — Progress Notes (Signed)
CC: HTN  HPI:  [name] is a 59 y.o. female with a past medical history stated below and presents today for HTN. Please see problem based assessment and plan for additional details.  Past Medical History:  Diagnosis Date  . AKI (acute kidney injury) ([place]) 07/17/2017  . Chronic kidney disease (CKD), stage III (moderate) (HCC)   . DIABETES MELLITUS, TYPE II 06/02/2006   Last HBA1C 8.7. , check 3 times daily  . Hydronephrosis    sees Dr Ashley Long at [facility] ,  . HYPERLIPIDEMIA 06/02/2006   Qualifier: Diagnosis of  By: Ashley Paradise MD, Ashley Long    . HYPERTENSION 06/02/2006   Filed Vitals:   01/21/11 1134  BP: 162/98  Pulse: 80  Temp: 97.4 F (36.3 C)  TempSrc: Oral  Resp: 20  Height: '5\' 8"'  (1.727 m)  Weight: 261 lb (118.389 kg)     . Injury   . Stage 2 chronic kidney disease 12/02/2017   now stage 3   . Wound of right leg    right abover ankle, patient report sshe sustained a week ago from today  (11-08-2018) after hitting her leg on an object , reports wound is scabbed over and her pcp checked it out at [facility]  some days ago and was not converned, patient denies redness or pain at sit eof wound     Current Outpatient Medications on File Prior to Visit  Medication Sig Dispense Refill  . amLODipine (NORVASC) 10 MG tablet Take 1 tablet (10 mg total) by mouth at bedtime. 90 tablet 3  . aspirin (ASPIRIN ADULT LOW STRENGTH) 81 MG EC tablet Take 1 tablet (81 mg total) by mouth daily. Swallow whole. 90 tablet 3  . atorvastatin (LIPITOR) 40 MG tablet Take 1 tablet (40 mg total) by mouth daily. 90 tablet 3  . Blood Glucose Monitoring Suppl (WAVESENSE PRESTO) W/DEVICE KIT 1 each by Does not apply route 2 (two) times daily. 1 each 0  . canagliflozin (INVOKANA) 300 MG TABS tablet TAKE 1 Tablet BY MOUTH ONCE EVERY DAY BEFORE BREAKFAST 30 tablet 5  . carvedilol (COREG) 25 MG tablet Take 1 tablet (25 mg total) by mouth 2 (two) times daily. 180 tablet 3  . cloNIDine (CATAPRES) 0.3 MG tablet Take  1 tablet (0.3 mg total) by mouth 2 (two) times daily. 180 tablet 3  . glucose blood (WAVESENSE PRESTO) test strip Use to test blood sugar 3 times daily. diag code E11.9. Insulin dependent 100 each 11  . hydrochlorothiazide (HYDRODIURIL) 25 MG tablet Take 1 tablet (25 mg total) by mouth daily. 30 tablet 5  . Insulin Glargine (BASAGLAR KWIKPEN) 100 UNIT/ML Inject 0.14 mLs (14 Units total) into the skin 2 (two) times daily. 15 mL 5  . insulin lispro (HUMALOG) 100 UNIT/ML KwikPen Inject 0.03-0.08 mLs (3-8 Units total) into the skin 3 (three) times daily. 15 mL 5  . Insulin Pen Needle (PEN NEEDLES) 31G X 5 MM MISC 4 Doses/Fill by Does not apply route daily. 100 each 11  . Insulin Syringe-Needle U-100 31G X 15/64" 0.3 ML MISC 1 each by Does not apply route 2 (two) times daily with a meal. 100 each 11  . ketotifen (ALAWAY) 0.025 % ophthalmic solution Place 1 drop into both eyes daily as needed (allergies).    . Lancets 30G MISC 1 each by Does not apply route 2 (two) times daily. 100 each 12  . losartan (COZAAR) 50 MG tablet TAKE 1 Tablet BY MOUTH ONCE EVERY DAY 90  tablet 3  . magnesium oxide (MAG-OX) 400 MG tablet Take 400 mg by mouth 2 (two) times daily.    Marland Kitchen omeprazole (PRILOSEC) 40 MG capsule Take 1 capsule (40 mg total) by mouth daily. 90 capsule 3  . polyethylene glycol powder (MIRALAX) powder Take 17 g by mouth daily. (Patient not taking: Reported on 11/03/2018) 255 g 5  . sitaGLIPtin-metformin (JANUMET) 50-1000 MG tablet Take 1 tablet by mouth 2 (two) times daily with a meal. 180 tablet 3   No current facility-administered medications on file prior to visit.    Family History  Problem Relation Age of Onset  . Other Neg Hx     Social History   Socioeconomic History  . Marital status: Single    Spouse name: Not on file  . Number of children: Not on file  . Years of education: 29  . Highest education level: Not on file  Occupational History  . Occupation: Surveyor, quantity: [employer]  Tobacco Use  . Smoking status: Former Smoker    Quit date: 10/15/1982    Years since quitting: 37.2  . Smokeless tobacco: Never Used  Substance and Sexual Activity  . Alcohol use: No    Alcohol/week: 0.0 standard drinks  . Drug use: No  . Sexual activity: Not on file  Other Topics Concern  . Not on file  Social History Narrative   Financial assistance approved for 100% discount at [facility] and has [facility] card per Bonna Gains   02/12/10         Social Determinants of Health   Financial Resource Strain:   . Difficulty of Paying Living Expenses: Not on file  Food Insecurity:   . Worried About Charity fundraiser in the Last Year: Not on file  . Ran Out of Food in the Last Year: Not on file  Transportation Needs:   . Lack of Transportation (Medical): Not on file  . Lack of Transportation (Non-Medical): Not on file  Physical Activity:   . Days of Exercise per Week: Not on file  . Minutes of Exercise per Session: Not on file  Stress:   . Feeling of Stress : Not on file  Social Connections:   . Frequency of Communication with Friends and Family: Not on file  . Frequency of Social Gatherings with Friends and Family: Not on file  . Attends Religious Services: Not on file  . Active Member of Clubs or Organizations: Not on file  . Attends Archivist Meetings: Not on file  . Marital Status: Not on file  Intimate Partner Violence:   . Fear of Current or Ex-Partner: Not on file  . Emotionally Abused: Not on file  . Physically Abused: Not on file  . Sexually Abused: Not on file    Review of Systems: ROS negative except for what is noted on the assessment and plan.  Vitals:   12/21/19 1453  BP: (!) 174/99  Pulse: 77  Temp: 98.2 F (36.8 C)  TempSrc: Oral  SpO2: 100%  Weight: 230 lb (104.3 kg)  Height: '5\' 8"'  (1.727 m)     Physical Exam: Physical Exam Constitutional:      Appearance: Normal appearance.  HENT:     Head: Normocephalic and atraumatic.    Cardiovascular:     Rate and Rhythm: Normal rate.     Pulses: Normal pulses.     Heart sounds: Normal heart sounds.  Pulmonary:     Effort: Pulmonary effort  is normal.     Breath sounds: Normal breath sounds.  Musculoskeletal:        General: Normal range of motion.     Cervical back: Normal range of motion.     Right lower leg: No edema.     Left lower leg: No edema.  Skin:    General: Skin is warm and dry.  Neurological:     Mental Status: She is alert and oriented to person, place, and time. Mental status is at baseline.      Assessment & Plan:   See Encounters Tab for problem based charting.  Patient discussed with Dr. Karie Schwalbe, D.O. [facility], PGY-2 Pager: [phone], Phone: [phone] Date 12/22/2019 Time 8:09 PM

## 2019-12-22 ENCOUNTER — Encounter: Payer: Self-pay | Admitting: Internal Medicine

## 2019-12-22 ENCOUNTER — Telehealth: Payer: Self-pay | Admitting: Dietician

## 2019-12-22 NOTE — Assessment & Plan Note (Addendum)
Patient presents today for further evaluation and management of her HTN. Her blood pressure is controlled on losartan 50 mg, hydrochlorothiazide 25 mg, carvedilol 25 mg twice daily, clonidine 0.3 mg twice daily amlodipine 10 mg daily. She admits to being compliant with his antihypertensive medications. She denies medication side effects.   Cardiovascular risk factors: diabetes mellitus, dyslipidemia, hypertension, obesity (BMI >= 30 kg/m2) and smoking/ tobacco exposure. Use of agents associated with hypertension: none. History of target organ damage: chronic kidney disease. She is exercising, adherent to a low-salt diet, and checking his blood pressure at home.   Her home blood pressure cuff was compared against our clinics and were reasonably similar.  Current Blood pressure for today's visit is listed below:  Blood Pressure 12/21/2019 07/27/2019 06/15/2019 06/08/2019 04/20/2019  BP 174/99 120/80 176/84 120/80 175/83  Some recent data might be hidden    Plan: 1. Continue current antihypertensive medications.

## 2019-12-22 NOTE — Telephone Encounter (Signed)
Ms Marzano called for diabetes self management support.

## 2019-12-22 NOTE — Assessment & Plan Note (Signed)
Patient had a colonoscopy last year and is scheduled for a mammogram in November.

## 2019-12-22 NOTE — Assessment & Plan Note (Signed)
Patient presents for further evaluation and management of her DM. Her DM is controlled on canagloflozin 300 mg, Janumet 50-100 mg, Glargine 14 units twice daily and humalog sliding scale 3-8 units three times daily. She admits to being compliant with her diabetes medications. she denies foot ulcerations, hypoglycemia , nausea, paresthesia of the feet, polydipsia, polyuria, visual disturbances and vomitting. Her last eye exam was in May 2021 and waiting on the result to be faxed to the clinic.   Patients current random blood sugar was  Glucose-Capillary  Date/Time Value Ref Range Status  12/21/2019 03:08 PM 157 (H) 70 - 99 mg/dL Final    Comment:    Glucose reference range applies only to samples taken after fasting for at least 8 hours.     Last A1c was  Lab Results  Component Value Date   HGBA1C 7.5 (A) 12/21/2019  .   Patient has not seen [name] recently.  I counseled the patient regarding lifestyle modifications including moderate-intensity aerobic exercise for 30-60 minutes most days of the week in combination with dieting (DASH/Mediterranean) to achieve a sustained weight loss of 5-7%. I also recommended supplemental Diabetes education and weight management with [name].    Plan: 1. Continue current diabetic medication management. 2. Follow up in 3 months

## 2019-12-23 NOTE — Progress Notes (Signed)
Internal Medicine Clinic Attending  Case discussed with Dr. Coe  At the time of the visit.  We reviewed the resident's history and exam and pertinent patient test results.  I agree with the assessment, diagnosis, and plan of care documented in the resident's note.  

## 2020-01-14 ENCOUNTER — Ambulatory Visit: Payer: No Typology Code available for payment source | Attending: Internal Medicine

## 2020-01-14 DIAGNOSIS — Z23 Encounter for immunization: Secondary | ICD-10-CM

## 2020-01-14 NOTE — Progress Notes (Signed)
   Covid-19 Vaccination Clinic  Name:  Ashley Long    MRN: [id number] DOB: 1960/12/13  01/14/2020  [name] was observed post Covid-19 immunization for 15 minutes without incident. She was provided with Vaccine Information Sheet and instruction to access the V-Safe system.   [name] was instructed to call 911 with any severe reactions post vaccine: Marland Kitchen Difficulty breathing  . Swelling of face and throat  . A fast heartbeat  . A bad rash all over body  . Dizziness and weakness

## 2020-02-01 ENCOUNTER — Telehealth: Payer: Self-pay | Admitting: Dietician

## 2020-02-01 NOTE — Telephone Encounter (Signed)
Ashley Long calls for diabetes advice: she states that her "Blood sugars are a little low":  Only taking 3 units 86/95/96/65- drank juice /76- ate chocolate /73/88/90 His am 144 - took 3 units bowl of raisin bran weight- thinks less than 228# basaglar- 14 units twice a day injecting into thigh, side of stomach, humalog in abdomen.  Dropped about 2 times last week lower than 65   Wt Readings from Last 10 Encounters:  12/21/19 230 lb (104.3 kg)  07/27/19 229 lb (103.9 kg)  06/15/19 229 lb 14.4 oz (104.3 kg)  06/15/19 229 lb 14.4 oz (104.3 kg)  06/08/19 228 lb 11.2 oz (103.7 kg)  06/08/19 228 lb 11.2 oz (103.7 kg)  06/01/19 228 lb 1.6 oz (103.5 kg)  04/20/19 231 lb 3.2 oz (104.9 kg)  11/08/18 242 lb (109.8 kg)  11/03/18 242 lb 8 oz (110 kg)   Lab Results  Component Value Date   HGBA1C 7.5 (A) 12/21/2019   HGBA1C 7.9 (A) 07/27/2019   HGBA1C 7.8 (A) 04/20/2019   HGBA1C 8.3 (A) 11/03/2018   HGBA1C 8.3 (A) 08/11/2018    Will  call with more sugar readings.

## 2020-02-02 NOTE — Telephone Encounter (Signed)
Ashley Long calls with more blood sugar values to discusses hypoglycemia:  Day  Breakfast Lunch  Dinner  Bedtime Today 107  136/112  11/17 144  126  133   11/16 86  96  96 65/ 88 11/15 138  78  86  131  11/14 154  154  203   11/13 144  162  292 11/12 99  136  147  11/11 138  132  147 60/90/  96 11/10 186  144  156  151 11/9 140  137  245  145 11/8 147  100  82 66/50/ 72  Ashley Long reports no symptoms for one of the above low blood sugars. They are happening shortly after her dinner meal. She injects 3-5 units of insulin with her meals according to her carb intake.  We discussed matching her food intake to her mealtime insulin (advanced carb counting). She thinks she may have lost a bit more weight, is eating small portions of mostly healthy foods.  suggested professional Continuous glucose monitoring. She will let us know if that is something she would like to do.  She decided she would either add a small amount of carb to the dinner meal or decrease her mealtime insulin when she eats less to try to prevent hypoglycemia.

## 2020-02-15 ENCOUNTER — Ambulatory Visit: Payer: Self-pay

## 2020-02-15 ENCOUNTER — Other Ambulatory Visit: Payer: Self-pay

## 2020-03-22 ENCOUNTER — Encounter: Payer: Self-pay | Admitting: Internal Medicine

## 2020-03-22 ENCOUNTER — Other Ambulatory Visit: Payer: Self-pay

## 2020-03-22 ENCOUNTER — Ambulatory Visit (INDEPENDENT_AMBULATORY_CARE_PROVIDER_SITE_OTHER): Payer: Self-pay | Admitting: Internal Medicine

## 2020-03-22 VITALS — BP 153/78 | HR 80 | Temp 98.0°F | Ht 68.0 in | Wt 225.3 lb

## 2020-03-22 DIAGNOSIS — N1832 Chronic kidney disease, stage 3b: Secondary | ICD-10-CM

## 2020-03-22 DIAGNOSIS — D649 Anemia, unspecified: Secondary | ICD-10-CM

## 2020-03-22 DIAGNOSIS — Z794 Long term (current) use of insulin: Secondary | ICD-10-CM

## 2020-03-22 DIAGNOSIS — I10 Essential (primary) hypertension: Secondary | ICD-10-CM

## 2020-03-22 DIAGNOSIS — L720 Epidermal cyst: Secondary | ICD-10-CM

## 2020-03-22 DIAGNOSIS — Z0000 Encounter for general adult medical examination without abnormal findings: Secondary | ICD-10-CM

## 2020-03-22 DIAGNOSIS — Z299 Encounter for prophylactic measures, unspecified: Secondary | ICD-10-CM

## 2020-03-22 DIAGNOSIS — E1122 Type 2 diabetes mellitus with diabetic chronic kidney disease: Secondary | ICD-10-CM

## 2020-03-22 DIAGNOSIS — Z23 Encounter for immunization: Secondary | ICD-10-CM

## 2020-03-22 DIAGNOSIS — E785 Hyperlipidemia, unspecified: Secondary | ICD-10-CM

## 2020-03-22 DIAGNOSIS — I739 Peripheral vascular disease, unspecified: Secondary | ICD-10-CM

## 2020-03-22 LAB — POCT GLYCOSYLATED HEMOGLOBIN (HGB A1C): Hemoglobin A1C: 7.7 % — AB (ref 4.0–5.6)

## 2020-03-22 LAB — GLUCOSE, CAPILLARY: Glucose-Capillary: 177 mg/dL — ABNORMAL HIGH (ref 70–99)

## 2020-03-22 NOTE — Patient Instructions (Addendum)
Thank you, [name] for allowing [place] to provide your care today. Today we discussed blood pressure and diabetes.    I have ordered the following labs for you:   Lab Orders     Glucose, capillary     BMP8+Anion Gap     Aldosterone/Renin     POC Hbg A1C   Tests ordered today:  [place] of kidneys  Consider sleep study at next visit.  Referrals ordered today:    Referral Orders     Ambulatory referral to Dermatology   I have ordered the following medication/changed the following medications:   Stop the following medications: There are no discontinued medications.   Start the following medications: No orders of the defined types were placed in this encounter.    Please decrease your dinner insulin by 1-2 units. (lispro)  Follow up: 2 weeks    Should you have any questions or concerns please call the internal medicine clinic at [phone].     Dellia Cloud, D.O. [facility]

## 2020-03-22 NOTE — Progress Notes (Signed)
CC: Diabetes Mellitus  HPI:  Ashley Long is a 60 y.o. female with a past medical history stated below and presents today for diabetes. Please see problem based assessment and plan for additional details.  Past Medical History:  Diagnosis Date  . AKI (acute kidney injury) ([place]) 07/17/2017  . Chronic kidney disease (CKD), stage III (moderate) (HCC)   . DIABETES MELLITUS, TYPE II 06/02/2006   Last HBA1C 8.7. , check 3 times daily  . Hydronephrosis    sees Dr Royce Macadamia at [place] [facility] ,  . HYPERLIPIDEMIA 06/02/2006   Qualifier: Diagnosis of  By: Patrice Paradise MD, Roderic Palau    . HYPERTENSION 06/02/2006   Filed Vitals:   01/21/11 1134  BP: 162/98  Pulse: 80  Temp: 97.4 F (36.3 C)  TempSrc: Oral  Resp: 20  Height: $Remove'5\' 8"'qBqiiGu$  (1.727 m)  Weight: 261 lb (118.389 kg)     . Injury   . Stage 2 chronic kidney disease 12/02/2017   now stage 3   . Wound of right leg    right abover ankle, patient report sshe sustained a week ago from today  (11-08-2018) after hitting her leg on an object , reports wound is scabbed over and her pcp checked it out [facility]  some days ago and was not converned, patient denies redness or pain at sit eof wound     Current Outpatient Medications on File Prior to Visit  Medication Sig Dispense Refill  . amLODipine (NORVASC) 10 MG tablet Take 1 tablet (10 mg total) by mouth at bedtime. 90 tablet 3  . aspirin (ASPIRIN ADULT LOW STRENGTH) 81 MG EC tablet Take 1 tablet (81 mg total) by mouth daily. Swallow whole. 90 tablet 3  . atorvastatin (LIPITOR) 40 MG tablet Take 1 tablet (40 mg total) by mouth daily. 90 tablet 3  . Blood Glucose Monitoring Suppl (WAVESENSE PRESTO) W/DEVICE KIT 1 each by Does not apply route 2 (two) times daily. 1 each 0  . canagliflozin (INVOKANA) 300 MG TABS tablet TAKE 1 Tablet BY MOUTH ONCE EVERY DAY BEFORE BREAKFAST 30 tablet 5  . carvedilol (COREG) 25 MG tablet Take 1 tablet (25 mg total) by mouth 2 (two) times daily. 180 tablet 3  . cloNIDine (CATAPRES)  0.3 MG tablet Take 1 tablet (0.3 mg total) by mouth 2 (two) times daily. 180 tablet 3  . glucose blood (WAVESENSE PRESTO) test strip Use to test blood sugar 3 times daily. diag code E11.9. Insulin dependent 100 each 11  . hydrochlorothiazide (HYDRODIURIL) 25 MG tablet Take 1 tablet (25 mg total) by mouth daily. 30 tablet 5  . Insulin Glargine (BASAGLAR KWIKPEN) 100 UNIT/ML Inject 0.14 mLs (14 Units total) into the skin 2 (two) times daily. 15 mL 5  . insulin lispro (HUMALOG) 100 UNIT/ML KwikPen Inject 0.03-0.08 mLs (3-8 Units total) into the skin 3 (three) times daily. 15 mL 5  . Insulin Pen Needle (PEN NEEDLES) 31G X 5 MM MISC 4 Doses/Fill by Does not apply route daily. 100 each 11  . Insulin Syringe-Needle U-100 31G X 15/64" 0.3 ML MISC 1 each by Does not apply route 2 (two) times daily with a meal. 100 each 11  . ketotifen (ALAWAY) 0.025 % ophthalmic solution Place 1 drop into both eyes daily as needed (allergies).    . Lancets 30G MISC 1 each by Does not apply route 2 (two) times daily. 100 each 12  . losartan (COZAAR) 50 MG tablet TAKE 1 Tablet BY MOUTH ONCE EVERY DAY  90 tablet 3  . magnesium oxide (MAG-OX) 400 MG tablet Take 400 mg by mouth 2 (two) times daily.    Marland Kitchen omeprazole (PRILOSEC) 40 MG capsule Take 1 capsule (40 mg total) by mouth daily. 90 capsule 3  . polyethylene glycol powder (MIRALAX) powder Take 17 g by mouth daily. (Patient not taking: Reported on 11/03/2018) 255 g 5  . sitaGLIPtin-metformin (JANUMET) 50-1000 MG tablet Take 1 tablet by mouth 2 (two) times daily with a meal. 180 tablet 3   No current facility-administered medications on file prior to visit.    Family History  Problem Relation Age of Onset  . Other Neg Hx     Social History   Socioeconomic History  . Marital status: Single    Spouse name: Not on file  . Number of children: Not on file  . Years of education: 41  . Highest education level: Not on file  Occupational History  . Occupation: Tourist information centre manager: [employer]  Tobacco Use  . Smoking status: Former Smoker    Quit date: 10/15/1982    Years since quitting: 37.4  . Smokeless tobacco: Never Used  Substance and Sexual Activity  . Alcohol use: No    Alcohol/week: 0.0 standard drinks  . Drug use: No  . Sexual activity: Not on file  Other Topics Concern  . Not on file  Social History Narrative   Financial assistance approved for 100% discount at [facility] and has [facility] card per Bonna Gains   02/12/10         Social Determinants of Health   Financial Resource Strain: Not on file  Food Insecurity: Not on file  Transportation Needs: Not on file  Physical Activity: Not on file  Stress: Not on file  Social Connections: Not on file  Intimate Partner Violence: Not on file    Review of Systems: ROS negative except for what is noted on the assessment and plan.  Vitals:   03/22/20 1510  BP: (!) 153/78  Pulse: 80  Temp: 98 F (36.7 C)  TempSrc: Oral  SpO2: 100%  Weight: 225 lb 4.8 oz (102.2 kg)  Height: $Remove'5\' 8"'qRzWkPk$  (1.727 m)     Physical Exam: Physical Exam Constitutional:      Appearance: Normal appearance.  Cardiovascular:     Rate and Rhythm: Normal rate.     Pulses: Normal pulses.     Heart sounds: Normal heart sounds.  Pulmonary:     Effort: Pulmonary effort is normal.     Breath sounds: Normal breath sounds.  Abdominal:     General: Bowel sounds are normal.     Palpations: Abdomen is soft.     Tenderness: There is no abdominal tenderness.  Musculoskeletal:        General: Normal range of motion.     Cervical back: Normal range of motion.     Right lower leg: No edema.     Left lower leg: No edema.  Skin:    General: Skin is warm and dry.  Neurological:     Mental Status: She is alert and oriented to person, place, and time. Mental status is at baseline.  Psychiatric:        Mood and Affect: Mood normal.      Assessment & Plan:   See Encounters Tab for problem based charting.  Patient  discussed with Dr. Kelle Darting, D.O. [facility], PGY-2 Pager: [phone], Phone: [phone] Date 03/23/2020 Time 12:47  PM

## 2020-03-23 ENCOUNTER — Encounter: Payer: Self-pay | Admitting: Internal Medicine

## 2020-03-23 DIAGNOSIS — I739 Peripheral vascular disease, unspecified: Secondary | ICD-10-CM | POA: Insufficient documentation

## 2020-03-23 DIAGNOSIS — L720 Epidermal cyst: Secondary | ICD-10-CM | POA: Insufficient documentation

## 2020-03-23 DIAGNOSIS — D649 Anemia, unspecified: Secondary | ICD-10-CM | POA: Insufficient documentation

## 2020-03-23 HISTORY — DX: Epidermal cyst: L72.0

## 2020-03-23 LAB — BMP8+ANION GAP
Anion Gap: 16 mmol/L (ref 10.0–18.0)
BUN/Creatinine Ratio: 21 (ref 9–23)
BUN: 36 mg/dL — ABNORMAL HIGH (ref 6–24)
CO2: 22 mmol/L (ref 20–29)
Calcium: 10.3 mg/dL — ABNORMAL HIGH (ref 8.7–10.2)
Chloride: 102 mmol/L (ref 96–106)
Creatinine, Ser: 1.68 mg/dL — ABNORMAL HIGH (ref 0.57–1.00)
GFR calc Af Amer: 39 mL/min/{1.73_m2} — ABNORMAL LOW (ref >59)
GFR calc non Af Amer: 33 mL/min/{1.73_m2} — ABNORMAL LOW (ref >59)
Glucose: 140 mg/dL — ABNORMAL HIGH (ref 65–99)
Potassium: 4.3 mmol/L (ref 3.5–5.2)
Sodium: 140 mmol/L (ref 134–144)

## 2020-03-23 NOTE — Assessment & Plan Note (Signed)
History of HLD on atorvastatin 40 mg and ASA 81 mg for secondary prevention. CT abdomen/pelvis showed atherosclerotic disease fo the aorta. She is tolerating the medicine well without sign of bleeding.   Plan: 1. Continue Atorvastatin and ASA

## 2020-03-23 NOTE — Assessment & Plan Note (Signed)
Located on left gluteus without surrounding erythema, edema, drainage, or rash. She expresses pain with sitting and would like for it to be removed.   Plan: - Refer to dermatology for excision.

## 2020-03-23 NOTE — Assessment & Plan Note (Signed)
tetanus given today

## 2020-03-23 NOTE — Assessment & Plan Note (Signed)
CT abdomen showed evidence of aortic atherosclerotic disease:  Plan: - Continue Atorvastatin 40 mg and ASA 81 mg

## 2020-03-23 NOTE — Assessment & Plan Note (Signed)
Patient presents today for further evaluation and management of her HTN.   Her blood pressure is uncontrolled on the following medications:  1. Losartan 50 mg 2. Clonidine 0.3 mg BID 3. Carvedilol 25 mg BID 4. Amlodipine 10 mg daily 5. HCTZ 25 mg daily  She admits to being adherent with her antihypertensive medications.   She identifies the following barriers: none.   She denies medication side effects such as abdominal discomfort, dry cough, excessive urination, fatigue, headaches, myalgias, nausea, orthostatic lightheadedness, palpitations, swelling in ankles and weakness.   Current exercise regimen: unknown  Diet habits: unknow  Checking BP at home: Yes  and remains elevated at home  Current Blood pressure for today's visit is listed below:  Blood Pressure 03/22/2020 12/21/2019 07/27/2019 06/15/2019 06/08/2019  BP 153/78 174/99 120/80 176/84 120/80  Some recent data might be hidden     Plan: 1. Work up for resistant HTN including: serum aldosterone and renin and renal artery doppler. Will defer sleep study till her 2 week follow -up 2. I will make medication changes at her 2 week follow up once we have the results of her work-up. 3. DASH diet  4. < 2.3 g/day Salt intake 5. Weight loss & aerobic exercise  (at least 150 minutes of moderate-intensity - 30 minutes for 5+ days/week)  6. Limit ETOH use (< 2 drinks/day for women and < 3 drinks/day for Men)

## 2020-03-23 NOTE — Assessment & Plan Note (Signed)
Patient presents for further evaluation and management of her DM.   Her DM is uncontrolled on the following medications:  1. Canagliflozin 300 mg  2. Janumet 50-1000 mg BID 3. Glargine 14 units BID 4. Lispro 3-8 units with meals   She admits to being compliant with her diabetes medications.   She denies foot ulcerations, nausea, paresthesia of the feet, polydipsia, polyuria, vomitting and weight loss.   She does have a home glucometer and reading were scanned in to chart. Looks like she is having some post prandial low blood sugars, particularlly at night. .   Current exercise level: unknown  Current diet: unknown  Last eye exam was on 06/21 and showed fundoscopic exam normal.   Last foot exam was today and showed warm, good capillary refill.  Patients current random blood sugar was  Glucose-Capillary  Date/Time Value Ref Range Status  03/22/2020 02:59 PM 177 (H) 70 - 99 mg/dL Final    Comment:    Glucose reference range applies only to samples taken after fasting for at least 8 hours.     Last A1c was  Lab Results  Component Value Date   HGBA1C 7.7 (A) 03/22/2020  .   Patients current weight and BMI:  Filed Weights   03/22/20 1510  Weight: 225 lb 4.8 oz (102.2 kg)     Body mass index is 34.26 kg/m.   She has identified the following barriers to her DM management: none.   We identified the following goals during this appointment: daily monitoring and recording of CBG adherence to ADA/ carb modified diet exercise most days/week adherence to prescribed medication regimen  Plan: 1. Decrease meal time insulin at night by 1-2 units. We will need to reevaluate her blood sugars in 2 weeks and may need CGM 2. Counseled patient regarding moderate-intensity aerobic exercise for 30-60 minutes most days 3. Counseled patient regarding weight loss 4. Target Range blood sugar: 100-120 and target range for A1c: </= 7.

## 2020-03-23 NOTE — Assessment & Plan Note (Signed)
Patient has a new normocytic anemia on a CBC form 2 years ago that has not been followed up. I don't see where it has been worked up either. She does have a history of CKD stage 3 which would account for her anemia. Furthermore, her most recent BMP does show a mild elevation in calcium without knowing her albumin for corrects. Her last albumin from 2 years ago was WNL, which makes me believe this is truly elevated. Patient also has a history of proteinuria.   Although she has many risk factors for CKD, I don't see any evidence of paraproteinemia work up. 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 Regardless she will need further evaluation of her anemia at her next appointment.

## 2020-03-25 IMAGING — US US RENAL
1 series · 14 of 25 positions shown · non-contrast
Comparison: Ultrasound 02/05/2018, 09/05/2014

CLINICAL DATA: Chronic kidney disease

EXAM:
RENAL / URINARY TRACT ULTRASOUND COMPLETE

[Series 1: us renal · 0.22mm/px · 14 of 47 slices shown]
[im 1/47]
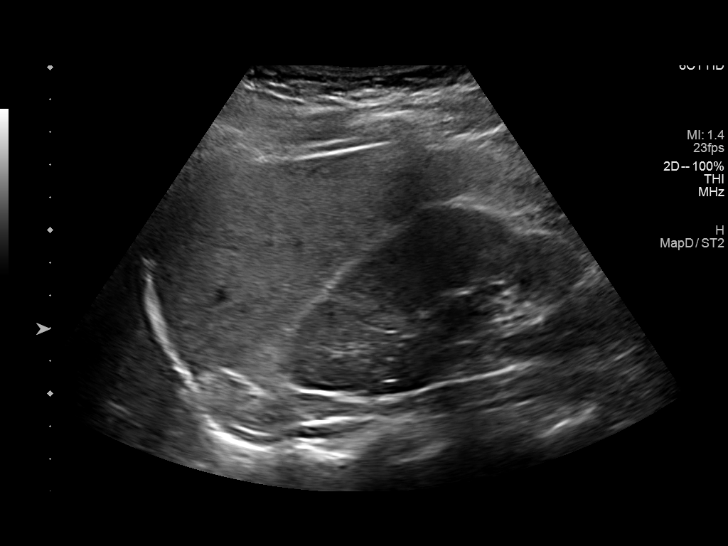
[im 4/47]
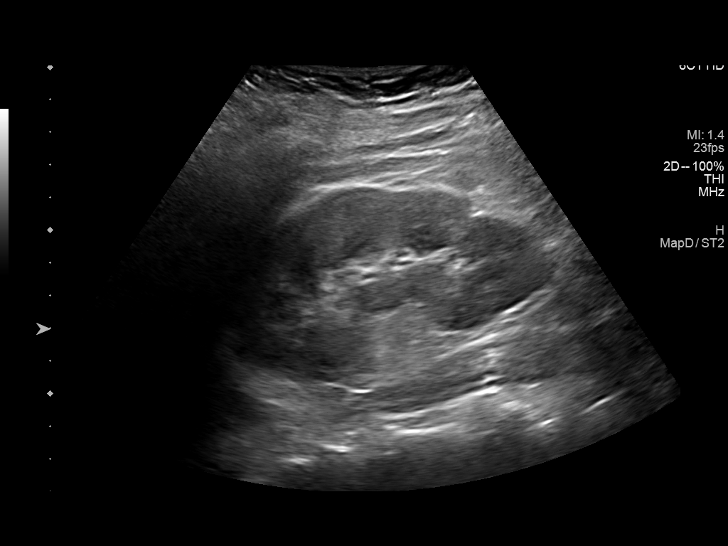
[im 8/47]
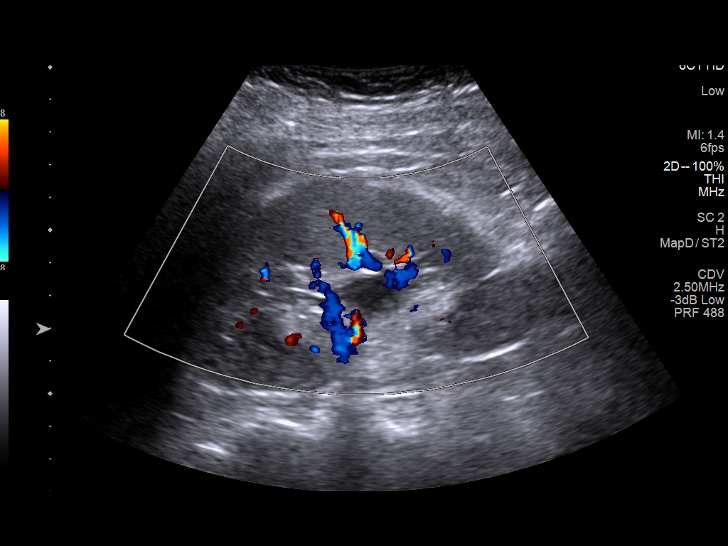
[im 12/47]
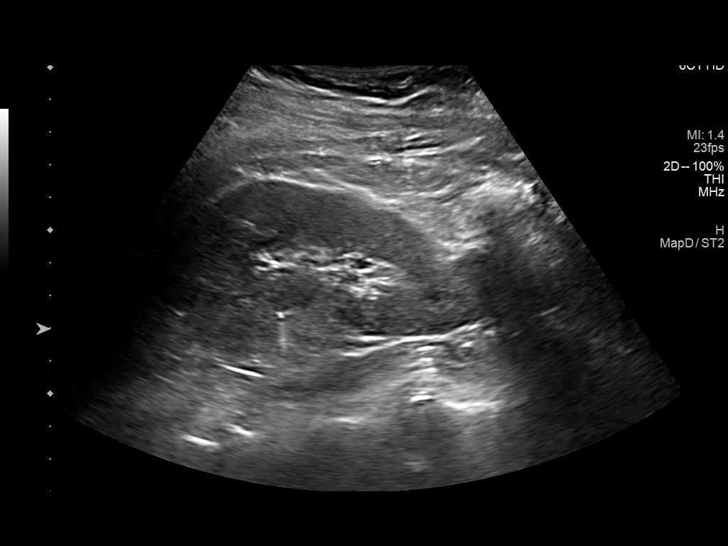
[im 16/47]
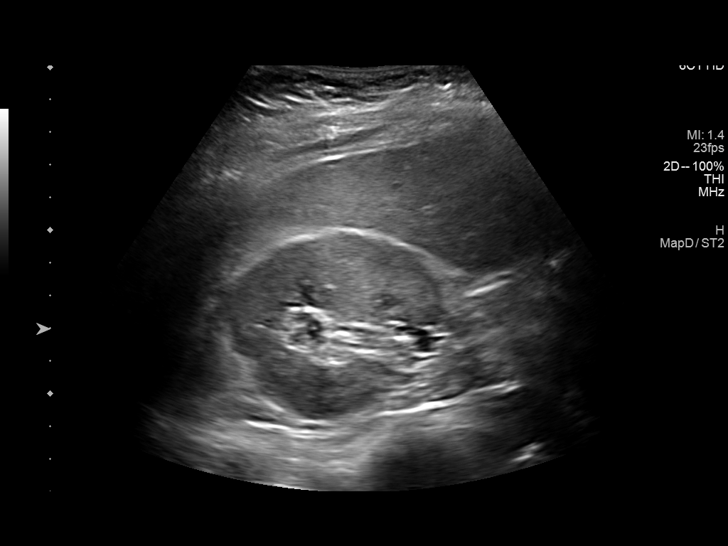
[im 18/47]
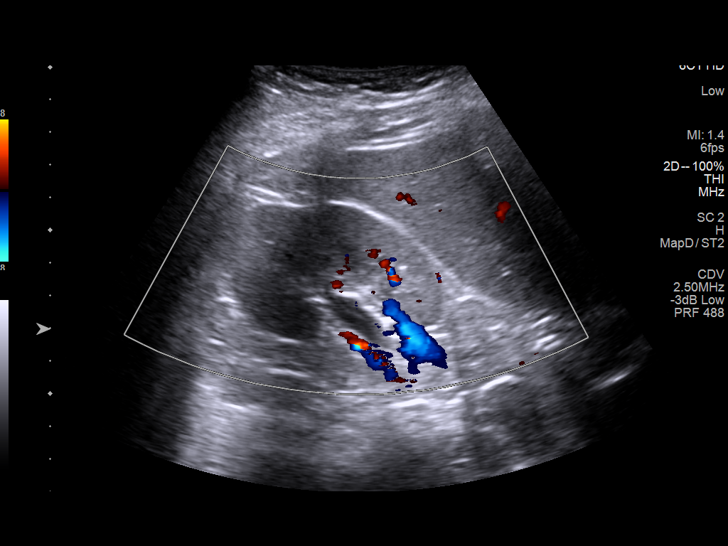
[im 22/47]
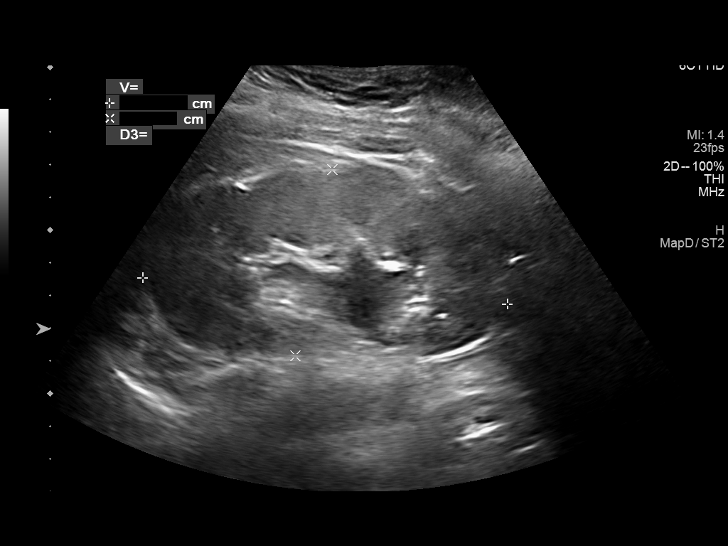
[im 25/47]
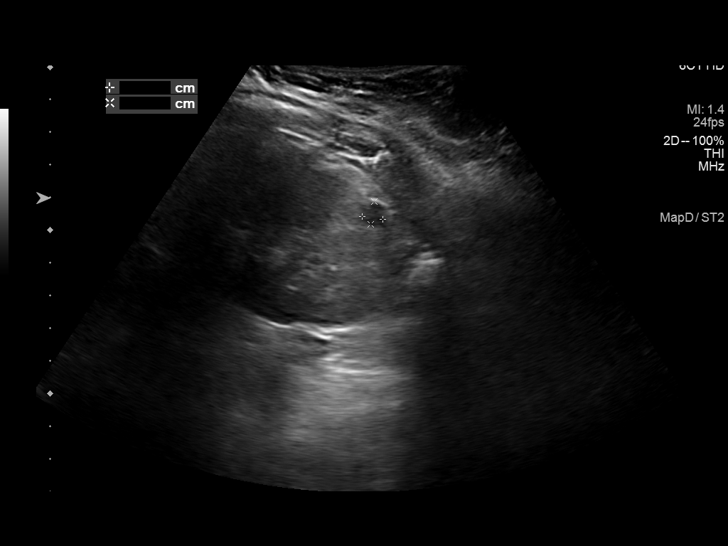
[im 29/47]
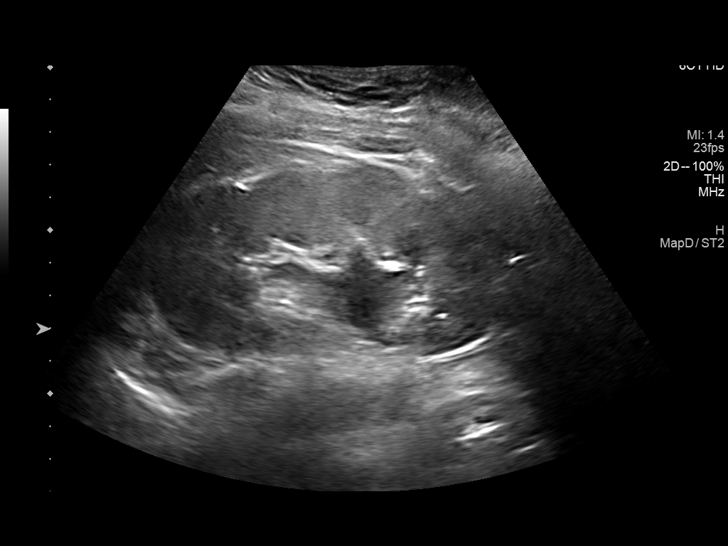
[im 31/47]
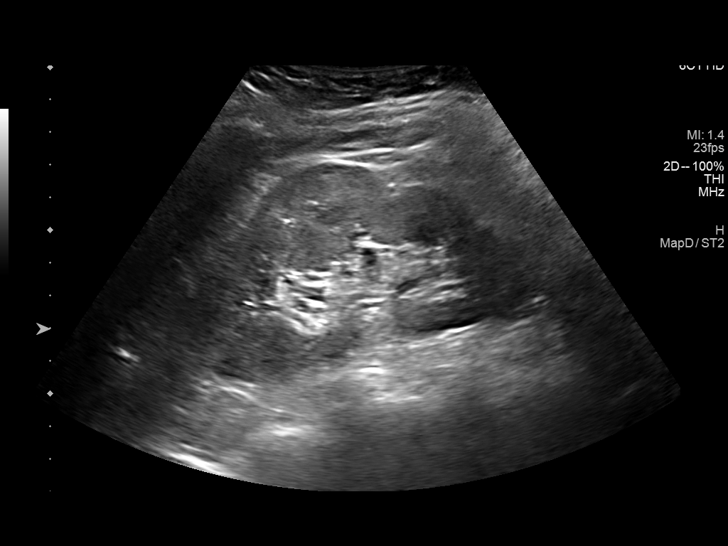
[im 35/47]
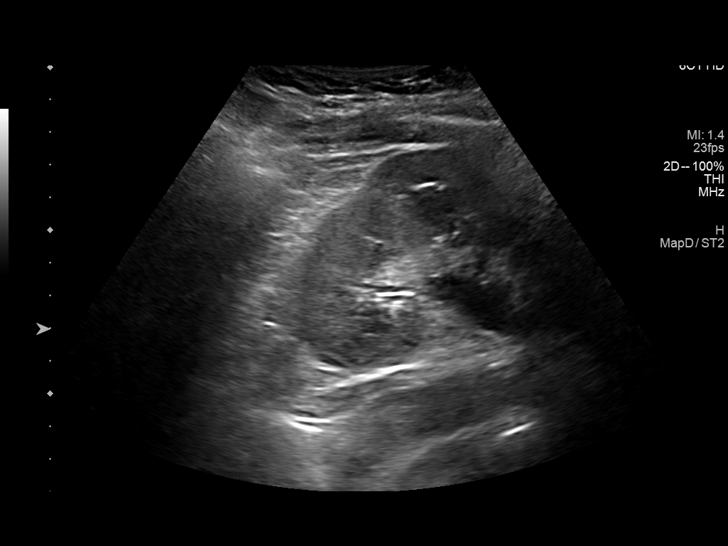
[im 39/47]
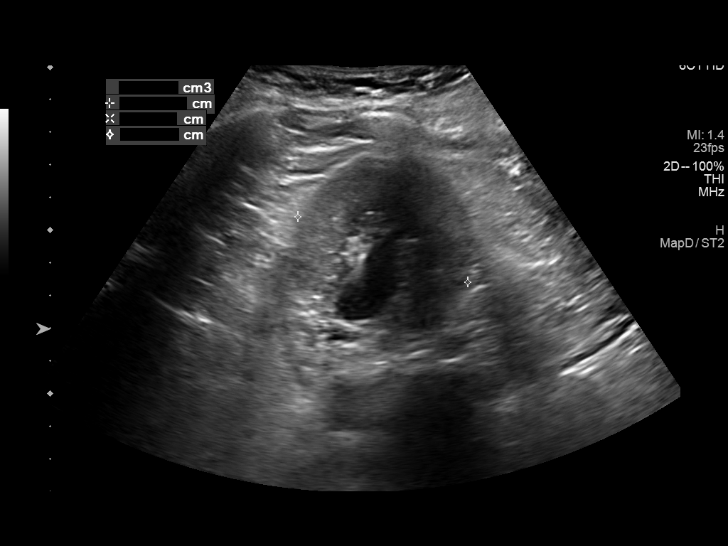
[im 43/47]
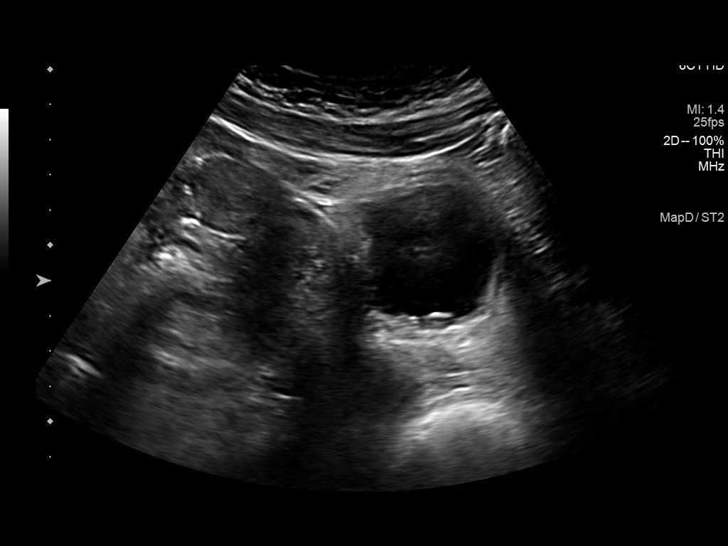
[im 47/47]
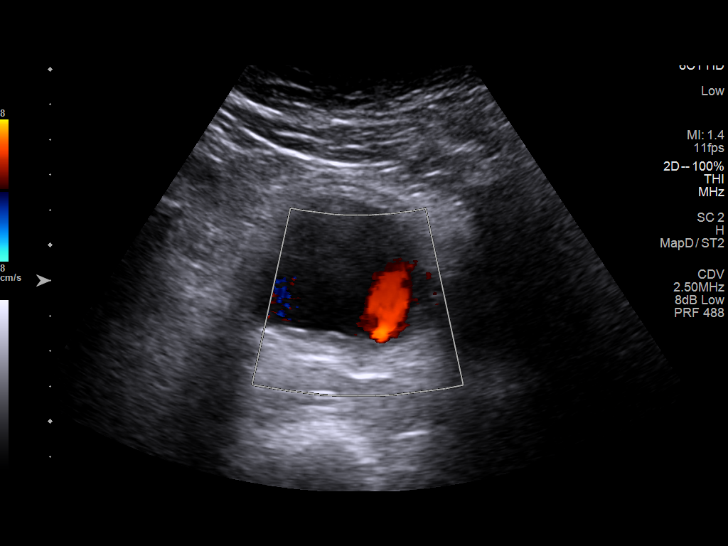

[14 of 25 positions shown; findings below may reference images not displayed]

FINDINGS: Right Kidney:

Renal measurements: 11.8 x 5.7 x 5.7 cm = volume: 200.7 mL. Cortical
echogenicity within normal limits. Mild right hydronephrosis without
significant change.

Left Kidney:

Renal measurements: 11.2 x 5.8 x 5.6 cm = volume: 189.6 mL. Cortical
echogenicity within normal limits. Mild left hydronephrosis without
significant change. Small hypoechoic mass lower pole left kidney
measuring 6 x 7 x 9 mm, previously 9 mm.

Bladder:

Appears normal for degree of bladder distention.
IMPRESSION: 1. Mild bilateral hydronephrosis without significant interval change
2. 9 mm hypoechoic mass lower pole left kidney, possible complex
cyst, no change in size since 02/05/2018.

## 2020-03-28 LAB — ALDOSTERONE + RENIN ACTIVITY W/ RATIO
ALDOS/RENIN RATIO: 0.3 (ref 0.0–30.0)
ALDOSTERONE: 3.6 ng/dL (ref 0.0–30.0)
Renin: 10.399 ng/mL/hr — ABNORMAL HIGH (ref 0.167–5.380)

## 2020-03-28 NOTE — Progress Notes (Signed)
Internal Medicine Attending Note:  This patient's plan of care was discussed with the house staff. Please see their note for complete details. I concur with their findings.  Velna Ochs, MD 03/28/2020, 9:14 AM

## 2020-04-03 NOTE — Progress Notes (Signed)
Will discuss patients lab results at her follow up appointment. It is difficult to draw any conclusions from her serum aldosterone/renin levels as she is take an ARB medication. Renal Artery [place] still pending.

## 2020-04-05 ENCOUNTER — Ambulatory Visit (INDEPENDENT_AMBULATORY_CARE_PROVIDER_SITE_OTHER): Payer: Self-pay | Admitting: Internal Medicine

## 2020-04-05 ENCOUNTER — Encounter: Payer: Self-pay | Admitting: Internal Medicine

## 2020-04-05 DIAGNOSIS — N1832 Chronic kidney disease, stage 3b: Secondary | ICD-10-CM

## 2020-04-05 DIAGNOSIS — K219 Gastro-esophageal reflux disease without esophagitis: Secondary | ICD-10-CM

## 2020-04-05 DIAGNOSIS — I10 Essential (primary) hypertension: Secondary | ICD-10-CM

## 2020-04-05 DIAGNOSIS — E119 Type 2 diabetes mellitus without complications: Secondary | ICD-10-CM

## 2020-04-05 DIAGNOSIS — N183 Chronic kidney disease, stage 3 unspecified: Secondary | ICD-10-CM

## 2020-04-05 DIAGNOSIS — E1122 Type 2 diabetes mellitus with diabetic chronic kidney disease: Secondary | ICD-10-CM

## 2020-04-05 DIAGNOSIS — Z794 Long term (current) use of insulin: Secondary | ICD-10-CM

## 2020-04-05 MED ORDER — INSULIN LISPRO (1 UNIT DIAL) 100 UNIT/ML (KWIKPEN)
3.0000 [IU] | PEN_INJECTOR | Freq: Three times a day (TID) | SUBCUTANEOUS | 5 refills | Status: DC
Start: 2020-04-05 — End: 2021-01-08

## 2020-04-05 MED ORDER — BASAGLAR KWIKPEN 100 UNIT/ML ~~LOC~~ SOPN
14.0000 [IU] | PEN_INJECTOR | Freq: Two times a day (BID) | SUBCUTANEOUS | 5 refills | Status: DC
Start: 2020-04-05 — End: 2020-05-16

## 2020-04-05 MED ORDER — "INSULIN SYRINGE-NEEDLE U-100 31G X 15/64"" 0.3 ML MISC": 1.0000 | Freq: Two times a day (BID) | 11 refills | Status: AC

## 2020-04-05 MED ORDER — CLONIDINE HCL 0.3 MG PO TABS: 0.3000 mg | ORAL_TABLET | Freq: Two times a day (BID) | ORAL | 3 refills | Status: DC

## 2020-04-05 MED ORDER — CANAGLIFLOZIN 300 MG PO TABS
ORAL_TABLET | ORAL | 5 refills | Status: DC
Start: 2020-04-05 — End: 2020-09-25

## 2020-04-05 MED ORDER — HYDROCHLOROTHIAZIDE 25 MG PO TABS: 25.0000 mg | ORAL_TABLET | Freq: Every day | ORAL | 5 refills | Status: DC

## 2020-04-05 MED ORDER — OMEPRAZOLE 40 MG PO CPDR
40.0000 mg | DELAYED_RELEASE_CAPSULE | Freq: Every day | ORAL | 3 refills | Status: DC
Start: 2020-04-05 — End: 2021-04-02

## 2020-04-05 MED ORDER — LOSARTAN POTASSIUM 50 MG PO TABS: ORAL_TABLET | ORAL | 3 refills | Status: DC

## 2020-04-05 MED ORDER — AMLODIPINE BESYLATE 10 MG PO TABS: 10.0000 mg | ORAL_TABLET | Freq: Every day | ORAL | 3 refills | Status: DC

## 2020-04-05 MED ORDER — ATORVASTATIN CALCIUM 40 MG PO TABS: 40.0000 mg | ORAL_TABLET | Freq: Every day | ORAL | 3 refills | Status: DC

## 2020-04-05 MED ORDER — ASPIRIN 81 MG PO TBEC: 81.0000 mg | DELAYED_RELEASE_TABLET | Freq: Every day | ORAL | 3 refills | Status: DC

## 2020-04-05 MED ORDER — CARVEDILOL 25 MG PO TABS: 25.0000 mg | ORAL_TABLET | Freq: Two times a day (BID) | ORAL | 3 refills | Status: DC

## 2020-04-05 MED ORDER — PEN NEEDLES 31G X 5 MM MISC: 4.0000 | Freq: Every day | 11 refills | Status: AC

## 2020-04-05 MED ORDER — JANUMET 50-1000 MG PO TABS: 1.0000 | ORAL_TABLET | Freq: Two times a day (BID) | ORAL | 3 refills | Status: DC

## 2020-04-05 NOTE — Patient Instructions (Signed)
Ms. AMILIAH CAMPISI,  It was a pleasure to see you today. Thank you for coming in.   Today we discussed your lab results. This has been reassuring. Your blood pressure looks great today! Continue taking your medications as prescribed. We still want you to get the imaging of your kidneys to further evaluate this.    We also discussed your diabetes. Please continue your current medications.   Please return to clinic in 1 month or sooner if needed.   Thank you again for coming in.   Asencion Noble.D.

## 2020-04-05 NOTE — Progress Notes (Signed)
   CC: HTN, DM and lab results  HPI:  [name] is a 60 y.o. with history listed below presenting for follow-up of her resistant hypertension, DM, and lab results.  Patient reports that she is feeling well today, she does not have any new complaints.  Past Medical History:  Diagnosis Date  . AKI (acute kidney injury) ([place]) 07/17/2017  . Chronic kidney disease (CKD), stage III (moderate) (HCC)   . DIABETES MELLITUS, TYPE II 06/02/2006   Last HBA1C 8.7. , check 3 times daily  . Hydronephrosis    sees Dr Royce Macadamia at [facility] ,  . HYPERLIPIDEMIA 06/02/2006   Qualifier: Diagnosis of  By: Patrice Paradise MD, Roderic Palau    . HYPERTENSION 06/02/2006   Filed Vitals:   01/21/11 1134  BP: 162/98  Pulse: 80  Temp: 97.4 F (36.3 C)  TempSrc: Oral  Resp: 20  Height: 5\' 8"  (1.727 m)  Weight: 261 lb (118.389 kg)     . Injury   . Stage 2 chronic kidney disease 12/02/2017   now stage 3   . Wound of right leg    right abover ankle, patient report sshe sustained a week ago from today  (11-08-2018) after hitting her leg on an object , reports wound is scabbed over and her pcp checked it out at [facility]  some days ago and was not converned, patient denies redness or pain at sit eof wound    Review of Systems:   Constitutional: Negative for chills and fever.  Respiratory: Negative for shortness of breath.   Cardiovascular: Negative for chest pain and leg swelling.  Gastrointestinal: Negative for abdominal pain, nausea and vomiting.  Neurological: Negative for dizziness and headaches.   Physical Exam:  Vitals:   04/05/20 1525  BP: 120/80  Pulse: 80  Temp: 97.9 F (36.6 C)  TempSrc: Oral  SpO2: 100%  Weight: 229 lb 12.8 oz (104.2 kg)  Height: 5\' 8"  (1.727 m)   Physical Exam Constitutional:      Appearance: Normal appearance.  HENT:     Head: Normocephalic and atraumatic.     Mouth/Throat:     Mouth: Mucous membranes are moist.     Pharynx: Oropharynx is clear.  Cardiovascular:     Rate and  Rhythm: Normal rate and regular rhythm.     Pulses: Normal pulses.     Heart sounds: Normal heart sounds.  Pulmonary:     Effort: Pulmonary effort is normal.     Breath sounds: Normal breath sounds.  Abdominal:     General: Abdomen is flat. Bowel sounds are normal.     Palpations: Abdomen is soft.  Musculoskeletal:     Cervical back: Normal range of motion and neck supple.  Skin:    General: Skin is warm and dry.     Capillary Refill: Capillary refill takes less than 2 seconds.  Neurological:     General: No focal deficit present.     Mental Status: She is alert and oriented to person, place, and time.  Psychiatric:        Mood and Affect: Mood normal.        Behavior: Behavior normal.    Assessment & Plan:   See Encounters Tab for problem based charting.  Patient discussed with Dr. Evette Doffing

## 2020-04-06 NOTE — Assessment & Plan Note (Signed)
Patient is currently on losartan 50 mg daily, clonidine 0.3 mg twice daily, carvedilol 25 mg twice daily, clonidine 10 mg daily, and HCTZ 25 mg daily.  She denies any issues taking her medications.  Denies any headaches, lightheadedness, dizziness, chest pain, nausea, vomiting, or lower extremity swelling.  Blood pressure today is well controlled at 120/80.  Labs done on last visit showed aldosterone 3.6, renin 10, and aldosterone/renin ratio of 0.3, K was 4.3.  While patient is on losartan which can potentially elevate plasma renin level and interfere with her results she does not have a significantly decreased aldosterone level.  Given history of difficult to control hypertension will be hesitant to hold her losartan to repeat labs.  Still awaiting renal artery Dopplers. There is discussion of obtaining a sleep study to evaluate for OSA, she denies any snoring, morning headaches, excessive fatigue or needing to nap during the day, or waking up needing to catch her breath.  Will defer sleep study for now.  She continues on the DASH diet and low-salt intake.  -Continue current medications -Follow-up renal artery Doppler -Continue DASH diet low-salt intake -RTC in 1 month to follow-up on renal artery Doppler results

## 2020-04-06 NOTE — Progress Notes (Signed)
Internal Medicine Clinic Attending ° °Case discussed with Dr. Krienke  At the time of the visit.  We reviewed the resident’s history and exam and pertinent patient test results.  I agree with the assessment, diagnosis, and plan of care documented in the resident’s note.  °

## 2020-04-06 NOTE — Assessment & Plan Note (Signed)
Patient is currently on canagliflozin 300 mg daily, Janumet 50-1000 milligrams twice daily, insulin glargine 14 units in a.m. and 12 units in p.m., lispro 3-8 units with meals.  She had decreased her nighttime insulin from her last visit.  Reports that her blood sugars have improved, lowest has been down to 65.  Denies any nausea, vomiting, polyuria, polydipsia, chest pain, shortness of breath, or other symptoms.  She does not have her meter with her at this time however states that she has been checking her CBGs 1-2 times per day. Her last A1c was 7.7.   -Advised patient to continue her current medications, to continue monitoring her blood sugars and bring in her meter at her next visit -RTC in 1 month to assess CBGs

## 2020-04-09 ENCOUNTER — Other Ambulatory Visit (HOSPITAL_COMMUNITY): Payer: Self-pay | Admitting: Internal Medicine

## 2020-04-09 DIAGNOSIS — I701 Atherosclerosis of renal artery: Secondary | ICD-10-CM

## 2020-04-12 ENCOUNTER — Other Ambulatory Visit: Payer: Self-pay

## 2020-04-12 ENCOUNTER — Ambulatory Visit (HOSPITAL_COMMUNITY)
Admission: RE | Admit: 2020-04-12 | Discharge: 2020-04-12 | Disposition: A | Discharge status: Home / Self Care | Payer: Self-pay | Source: Ambulatory Visit | Attending: Internal Medicine | Admitting: Internal Medicine

## 2020-04-12 DIAGNOSIS — I701 Atherosclerosis of renal artery: Secondary | ICD-10-CM

## 2020-05-14 ENCOUNTER — Telehealth: Payer: Self-pay

## 2020-05-14 NOTE — Telephone Encounter (Signed)
Requesting test results, please call pt back.  

## 2020-05-16 ENCOUNTER — Encounter: Payer: Self-pay | Admitting: Internal Medicine

## 2020-05-16 ENCOUNTER — Ambulatory Visit (INDEPENDENT_AMBULATORY_CARE_PROVIDER_SITE_OTHER): Payer: Self-pay | Admitting: Internal Medicine

## 2020-05-16 ENCOUNTER — Other Ambulatory Visit (HOSPITAL_COMMUNITY)
Admission: RE | Admit: 2020-05-16 | Discharge: 2020-05-16 | Disposition: A | Discharge status: Home / Self Care | Payer: No Typology Code available for payment source | Source: Ambulatory Visit | Attending: Internal Medicine | Admitting: Internal Medicine

## 2020-05-16 ENCOUNTER — Other Ambulatory Visit: Payer: Self-pay

## 2020-05-16 VITALS — BP 140/76 | HR 77 | Temp 98.5°F | Ht 68.0 in | Wt 226.9 lb

## 2020-05-16 DIAGNOSIS — Z794 Long term (current) use of insulin: Secondary | ICD-10-CM

## 2020-05-16 DIAGNOSIS — E1122 Type 2 diabetes mellitus with diabetic chronic kidney disease: Secondary | ICD-10-CM

## 2020-05-16 DIAGNOSIS — I129 Hypertensive chronic kidney disease with stage 1 through stage 4 chronic kidney disease, or unspecified chronic kidney disease: Secondary | ICD-10-CM

## 2020-05-16 DIAGNOSIS — I10 Essential (primary) hypertension: Secondary | ICD-10-CM

## 2020-05-16 DIAGNOSIS — N3000 Acute cystitis without hematuria: Secondary | ICD-10-CM

## 2020-05-16 DIAGNOSIS — Z1151 Encounter for screening for human papillomavirus (HPV): Secondary | ICD-10-CM

## 2020-05-16 DIAGNOSIS — E119 Type 2 diabetes mellitus without complications: Secondary | ICD-10-CM

## 2020-05-16 DIAGNOSIS — R102 Pelvic and perineal pain: Secondary | ICD-10-CM

## 2020-05-16 DIAGNOSIS — N1832 Chronic kidney disease, stage 3b: Secondary | ICD-10-CM

## 2020-05-16 DIAGNOSIS — R1030 Lower abdominal pain, unspecified: Secondary | ICD-10-CM

## 2020-05-16 MED ORDER — BASAGLAR KWIKPEN 100 UNIT/ML ~~LOC~~ SOPN: 28.0000 [IU] | PEN_INJECTOR | Freq: Every day | SUBCUTANEOUS | 5 refills | Status: DC

## 2020-05-16 NOTE — Patient Instructions (Signed)
Thank you for allowing [place] to provide your care today. Today we discussed your abdominal pain, diabetes and high blood pressure.   I have ordered the following labs for you:  Urinalysis, basic metabolic panel, complete blood count    I will call if any are abnormal.    Today we made the following changes to your medications:   Please START taking  Glargine 28U every evening  Please STOP taking   Glargine in the morning   Please follow-up in 1 week if your symptoms worsen or do not improve.   Please call the internal medicine center clinic if you have any questions or concerns, we may be able to help and keep you from a long and expensive emergency room wait. Our clinic and after hours phone number is [phone], the best time to call is Monday through Friday 9 am to 4 pm but there is always someone available 24/7 if you have an emergency. If you need medication refills please notify your pharmacy one week in advance and they will send [place] a request.

## 2020-05-16 NOTE — Telephone Encounter (Signed)
Pt has an appt today with Dr Sharon Seller.

## 2020-05-16 NOTE — Progress Notes (Signed)
° °  CC: TIIDM  HPI:  [name] is a 60 y.o. with PMH as below.   Please see A&P for assessment of the patient's acute and chronic medical conditions.   Lower central suprapelvic pain for the past week. No dysuria, difficulty with urination, sensation of incomplete emptying. She does have intermittent constipation but stools are otherwise normal, no diarrhea or loose stools, BRBPR, or dark stools. No pain with defecation. No fever, chills, nausea, vomiting, increased pain after eating. Last pap smear 2019 was normal but she would like to repeat this today. Last sexually active one year ago. Last colonoscopy did show congested and erythematous mucosa in the recto-sigmoid colon with pathology showing active chronic colitis.   Past Medical History:  Diagnosis Date   AKI (acute kidney injury) ([place]) 07/17/2017   Chronic kidney disease (CKD), stage III (moderate) (HCC)    DIABETES MELLITUS, TYPE II 06/02/2006   Last HBA1C 8.7. , check 3 times daily   Hydronephrosis    sees Dr Royce Macadamia at [facility] ,   [place] 06/02/2006   Qualifier: Diagnosis of  By: Patrice Paradise MD, Roderic Palau     HYPERTENSION 06/02/2006   Filed Vitals:   01/21/11 1134  BP: 162/98  Pulse: 80  Temp: 97.4 F (36.3 C)  TempSrc: Oral  Resp: 20  Height: 5\' 8"  (1.727 m)  Weight: 261 lb (118.389 kg)      Injury    Stage 2 chronic kidney disease 12/02/2017   now stage 3    Wound of right leg    right abover ankle, patient report sshe sustained a week ago from today  (11-08-2018) after hitting her leg on an object , reports wound is scabbed over and her pcp checked it out at [facility]  some days ago and was not converned, patient denies redness or pain at sit eof wound    Review of Systems:   10 point ROS negative except as noted in HPI  Physical Exam:   Constitution: NAD, appears stated age Cardio: RRR, no m/r/g, no LE edema  Respiratory: CTA, no w/r/r Abd: soft, non-distended, midline suprapubic tenderness,  normal BS  MSK: moving all extremities GU: vaginal exam without erythema, odor, +small amount opaque discharge, no adnexal or cervical motion tenderness  Neuro: normal affect, a&ox3 Skin: c/d/i    Vitals:   05/16/20 1328  BP: 140/76  Pulse: 77  Temp: 98.5 F (36.9 C)  TempSrc: Oral  SpO2: 100%  Weight: 226 lb 14.4 oz (102.9 kg)  Height: 5\' 8"  (1.727 m)     Assessment & Plan:   See Encounters Tab for problem based charting.  Patient discussed with Dr. Jimmye Norman

## 2020-05-17 DIAGNOSIS — R102 Pelvic and perineal pain: Secondary | ICD-10-CM | POA: Insufficient documentation

## 2020-05-17 HISTORY — DX: Pelvic and perineal pain: R10.2

## 2020-05-17 LAB — CBC WITH DIFFERENTIAL/PLATELET
Basophils Absolute: 0 10*3/uL (ref 0.0–0.2)
Basos: 1 %
EOS (ABSOLUTE): 0.3 10*3/uL (ref 0.0–0.4)
Eos: 4 %
Hematocrit: 40.7 % (ref 34.0–46.6)
Hemoglobin: 13.6 g/dL (ref 11.1–15.9)
Immature Grans (Abs): 0 10*3/uL (ref 0.0–0.1)
Immature Granulocytes: 0 %
Lymphocytes Absolute: 2.6 10*3/uL (ref 0.7–3.1)
Lymphs: 36 %
MCH: 28.8 pg (ref 26.6–33.0)
MCHC: 33.4 g/dL (ref 31.5–35.7)
MCV: 86 fL (ref 79–97)
Monocytes Absolute: 0.7 10*3/uL (ref 0.1–0.9)
Monocytes: 9 %
Neutrophils Absolute: 3.6 10*3/uL (ref 1.4–7.0)
Neutrophils: 50 %
Platelets: 257 10*3/uL (ref 150–450)
RBC: 4.72 x10E6/uL (ref 3.77–5.28)
RDW: 13.2 % (ref 11.7–15.4)
WBC: 7.2 10*3/uL (ref 3.4–10.8)

## 2020-05-17 LAB — BMP8+ANION GAP
Anion Gap: 18 mmol/L (ref 10.0–18.0)
BUN/Creatinine Ratio: 21 (ref 9–23)
BUN: 26 mg/dL — ABNORMAL HIGH (ref 6–24)
CO2: 20 mmol/L (ref 20–29)
Calcium: 9.8 mg/dL (ref 8.7–10.2)
Chloride: 102 mmol/L (ref 96–106)
Creatinine, Ser: 1.21 mg/dL — ABNORMAL HIGH (ref 0.57–1.00)
Glucose: 166 mg/dL — ABNORMAL HIGH (ref 65–99)
Potassium: 4 mmol/L (ref 3.5–5.2)
Sodium: 140 mmol/L (ref 134–144)
eGFR: 52 mL/min/{1.73_m2} — ABNORMAL LOW (ref >59)

## 2020-05-17 LAB — URINALYSIS, ROUTINE W REFLEX MICROSCOPIC
Bilirubin, UA: NEGATIVE
Nitrite, UA: NEGATIVE
RBC, UA: NEGATIVE
Specific Gravity, UA: >=1.03 — AB (ref 1.005–1.030)
Urobilinogen, Ur: 0.2 mg/dL (ref 0.2–1.0)
pH, UA: 5 (ref 5.0–7.5)

## 2020-05-17 LAB — CERVICOVAGINAL ANCILLARY ONLY
Bacterial Vaginitis (gardnerella): NEGATIVE
Candida Glabrata: NEGATIVE
Candida Vaginitis: NEGATIVE
Chlamydia: NEGATIVE
Comment: NEGATIVE
Comment: NEGATIVE
Comment: NEGATIVE
Comment: NEGATIVE
Comment: NEGATIVE
Comment: NORMAL
Neisseria Gonorrhea: NEGATIVE
Trichomonas: NEGATIVE

## 2020-05-17 LAB — MICROSCOPIC EXAMINATION
Casts: NONE SEEN /lpf
RBC, Urine: NONE SEEN /hpf (ref 0–2)
WBC, UA: >30 /hpf — AB (ref 0–5)

## 2020-05-17 NOTE — Assessment & Plan Note (Signed)
She has resistant hypertension. On aldosterone testing PRA was not suppressed while taking losartan, making hyperaldosteronism unlikely. Additionally ratio of PAC/PRA was .3. renal doppler US was done which was negative.   Medications include losartan 50 mg daily, clonidine 0.3 mg twice daily, carvedilol 25 mg twice daily, clonidine 10 mg daily, and HCTZ 25 mg daily. Blood pressure today 140/76.    - continue current medications  - plan was for possible sleep study if doppler negative, previous STOP-BANG 3. Discussed risks and benefits. She would like to think about this as she does not feel she has any symptoms, discuss again at follow-up

## 2020-05-17 NOTE — Assessment & Plan Note (Addendum)
Lower central suprapelvic pain for the past week. No dysuria, difficulty with urination, sensation of incomplete emptying. She does have intermittent constipation but stools are otherwise normal, no diarrhea or loose stools, BRBPR, or dark stools. No pain with defecation. No fever, chills, nausea, vomiting, increased pain after eating. Last pap smear 2019 was normal but she would like to repeat this today. Last sexually active one year ago. Last colonoscopy did show congested and erythematous mucosa in the recto-sigmoid colon with pathology showing active chronic colitis. As she is not currently having symptoms with BM will hold off on referral to GI and check labs.   - UA, BMP, CBC  - pap smear and STD testing per patient request  - refer to GI if symptoms persist   ADDENDUM: UA significant for leukocytes, many bacteria, no epithelial cells. Will send in bactrim bid for three days. Called patient, no answer, left vm to return call to clinic. Will try again later.   Addendum 3/7: discussed results. She was able to pick up her antibiotics and is feeling better.

## 2020-05-17 NOTE — Assessment & Plan Note (Signed)
Here for follow-up with no change in medications between visits. Prior to this insulin was decreased due to episodes of hypoglycemia. Current medications include canagliflozin 300 mg qd, janumet 50-1000 mg bid, lantus 14U bid, lispro 3-8U with breakfast and lunch, 1-3U with dinner. Last a1c was 7.7 03/22/2020.  Glucose readings today show range from 84-200, most readings ~140.   - change glargine to 28U qhs, otherwise continue current medications - f/u in two months for a1c - can consider addition of glp-1 at follow-up if GFR allows and tapering insulin

## 2020-05-18 LAB — CYTOLOGY - PAP
Comment: NEGATIVE
Diagnosis: NEGATIVE
High risk HPV: NEGATIVE

## 2020-05-18 MED ORDER — SULFAMETHOXAZOLE-TRIMETHOPRIM 800-160 MG PO TABS: 1.0000 | ORAL_TABLET | Freq: Two times a day (BID) | ORAL | 0 refills | Status: DC

## 2020-05-18 NOTE — Telephone Encounter (Signed)
Patient is returning Dr. Aurelio Jew phone call for test results.  Forwarding to triage.

## 2020-05-18 NOTE — Addendum Note (Signed)
Addended by: Molli Hazard A on: 05/18/2020 12:40 PM   Modules accepted: Orders

## 2020-05-21 ENCOUNTER — Telehealth: Payer: Self-pay

## 2020-05-21 NOTE — Telephone Encounter (Signed)
Was able to get in touch with her and discuss results and antibiotics.

## 2020-05-21 NOTE — Telephone Encounter (Signed)
Pt contact regarding results, if patient doesn't answer pls leave a detail message patient is at work [phone]

## 2020-05-29 NOTE — Progress Notes (Signed)
Internal Medicine Clinic Attending  Case discussed with Dr. Seawell  At the time of the visit.  We reviewed the resident's history and exam and pertinent patient test results.  I agree with the assessment, diagnosis, and plan of care documented in the resident's note.  

## 2020-07-12 ENCOUNTER — Ambulatory Visit (INDEPENDENT_AMBULATORY_CARE_PROVIDER_SITE_OTHER): Payer: Self-pay | Admitting: Internal Medicine

## 2020-07-12 ENCOUNTER — Encounter: Payer: Self-pay | Admitting: Internal Medicine

## 2020-07-12 VITALS — BP 140/70 | HR 78 | Temp 98.2°F | Ht 68.0 in | Wt 230.5 lb

## 2020-07-12 DIAGNOSIS — Z794 Long term (current) use of insulin: Secondary | ICD-10-CM

## 2020-07-12 DIAGNOSIS — N1832 Chronic kidney disease, stage 3b: Secondary | ICD-10-CM

## 2020-07-12 DIAGNOSIS — E1122 Type 2 diabetes mellitus with diabetic chronic kidney disease: Secondary | ICD-10-CM

## 2020-07-12 DIAGNOSIS — I10 Essential (primary) hypertension: Secondary | ICD-10-CM

## 2020-07-12 DIAGNOSIS — M21619 Bunion of unspecified foot: Secondary | ICD-10-CM

## 2020-07-12 LAB — GLUCOSE, CAPILLARY: Glucose-Capillary: 194 mg/dL — ABNORMAL HIGH (ref 70–99)

## 2020-07-12 LAB — POCT GLYCOSYLATED HEMOGLOBIN (HGB A1C): Hemoglobin A1C: 7.6 % — AB (ref 4.0–5.6)

## 2020-07-12 NOTE — Progress Notes (Signed)
CC: DM  HPI:  Ashley Long is a 60 y.o. female with a past medical history stated below and presents today for DM. Please see problem based assessment and plan for additional details.  Past Medical History:  Diagnosis Date  . AKI (acute kidney injury) ([place]) 07/17/2017  . Chronic kidney disease (CKD), stage III (moderate) (HCC)   . DIABETES MELLITUS, TYPE II 06/02/2006   Last HBA1C 8.7. , check 3 times daily  . Hydronephrosis    sees Dr Royce Macadamia at [facility] ,  . HYPERLIPIDEMIA 06/02/2006   Qualifier: Diagnosis of  By: Patrice Paradise MD, Roderic Palau    . HYPERTENSION 06/02/2006   Filed Vitals:   01/21/11 1134  BP: 162/98  Pulse: 80  Temp: 97.4 F (36.3 C)  TempSrc: Oral  Resp: 20  Height: _0  (1.727 m)  Weight: 261 lb (118.389 kg)     . Injury   . Stage 2 chronic kidney disease 12/02/2017   now stage 3   . Wound of right leg    right abover ankle, patient report sshe sustained a week ago from today  (11-08-2018) after hitting her leg on an object , reports wound is scabbed over and her pcp checked it out at [facility]  some days ago and was not converned, patient denies redness or pain at sit eof wound     Current Outpatient Medications on File Prior to Visit  Medication Sig Dispense Refill  . amLODipine (NORVASC) 10 MG tablet Take 1 tablet (10 mg total) by mouth at bedtime. 90 tablet 3  . aspirin (ASPIRIN ADULT LOW STRENGTH) 81 MG EC tablet Take 1 tablet (81 mg total) by mouth daily. Swallow whole. 90 tablet 3  . atorvastatin (LIPITOR) 40 MG tablet Take 1 tablet (40 mg total) by mouth daily. 90 tablet 3  . Blood Glucose Monitoring Suppl (WAVESENSE PRESTO) W/DEVICE KIT 1 each by Does not apply route 2 (two) times daily. 1 each 0  . canagliflozin (INVOKANA) 300 MG TABS tablet TAKE 1 Tablet BY MOUTH ONCE EVERY DAY BEFORE BREAKFAST 30 tablet 5  . carvedilol (COREG) 25 MG tablet Take 1 tablet (25 mg total) by mouth 2 (two) times daily. 180 tablet 3  . cloNIDine (CATAPRES) 0.3 MG tablet Take 1  tablet (0.3 mg total) by mouth 2 (two) times daily. 180 tablet 3  . glucose blood (WAVESENSE PRESTO) test strip Use to test blood sugar 3 times daily. diag code E11.9. Insulin dependent 100 each 11  . Insulin Glargine (BASAGLAR KWIKPEN) 100 UNIT/ML Inject 28 Units into the skin at bedtime. 15 mL 5  . insulin lispro (HUMALOG) 100 UNIT/ML KwikPen Inject 3-8 Units into the skin 3 (three) times daily. 15 mL 5  . Insulin Pen Needle (PEN NEEDLES) 31G X 5 MM MISC 4 Doses/Fill by Does not apply route daily. 100 each 11  . Insulin Syringe-Needle U-100 31G X 15/64" 0.3 ML MISC 1 each by Does not apply route 2 (two) times daily with a meal. 100 each 11  . ketotifen (ALAWAY) 0.025 % ophthalmic solution Place 1 drop into both eyes daily as needed (allergies).    . Lancets 30G MISC 1 each by Does not apply route 2 (two) times daily. 100 each 12  . losartan (COZAAR) 50 MG tablet TAKE 1 Tablet BY MOUTH ONCE EVERY DAY 90 tablet 3  . magnesium oxide (MAG-OX) 400 MG tablet Take 400 mg by mouth 2 (two) times daily.    Marland Kitchen omeprazole (PRILOSEC) 40 MG  capsule Take 1 capsule (40 mg total) by mouth daily. 90 capsule 3  . polyethylene glycol powder (MIRALAX) powder Take 17 g by mouth daily. (Patient not taking: Reported on 11/03/2018) 255 g 5  . sitaGLIPtin-metformin (JANUMET) 50-1000 MG tablet Take 1 tablet by mouth 2 (two) times daily with a meal. 180 tablet 3  . sulfamethoxazole-trimethoprim (BACTRIM DS) 800-160 MG tablet Take 1 tablet by mouth 2 (two) times daily. 6 tablet 0   No current facility-administered medications on file prior to visit.    Family History  Problem Relation Age of Onset  . Other Neg Hx     Social History   Socioeconomic History  . Marital status: Single    Spouse name: Not on file  . Number of children: Not on file  . Years of education: 84  . Highest education level: Not on file  Occupational History  . Occupation: Surveyor, quantity: [employer]  Tobacco Use  . Smoking  status: Former Smoker    Quit date: 10/15/1982    Years since quitting: 37.7  . Smokeless tobacco: Never Used  Substance and Sexual Activity  . Alcohol use: No    Alcohol/week: 0.0 standard drinks  . Drug use: No  . Sexual activity: Not on file  Other Topics Concern  . Not on file  Social History Narrative   Financial assistance approved for 100% discount at [facility] and has [facility] card per Bonna Gains   02/12/10         Social Determinants of Health   Financial Resource Strain: Not on file  Food Insecurity: Not on file  Transportation Needs: Not on file  Physical Activity: Not on file  Stress: Not on file  Social Connections: Not on file  Intimate Partner Violence: Not on file    Review of Systems: ROS negative except for what is noted on the assessment and plan.  Vitals:   07/12/20 1442  BP: 140/70  Pulse: 78  Temp: 98.2 F (36.8 C)  TempSrc: Oral  SpO2: 100%  Weight: 230 lb 8 oz (104.6 kg)  Height: _0  (1.727 m)    Physical Exam: Gen: A&O x3 and in no apparent distress, well appearing and nourished. HEENT: Head - normocephalic, atraumatic. Eye -  visual acuity grossly intact, conjunctiva clear, sclera non-icteric, EOM intact. Mouth - No obvious caries or periodontal disease. Neck: no obvious masses or nodules, AROM intact. CV: RRR, no murmurs, rubs, or gallops. S1/S2 presents  Resp: Clear to ascultation bilaterally  Abd: BS (+) x4, soft, non-tender, without obvious hepatosplenomegaly or masses MSK: Grossly normal AROM and strength x4 extremities. Skin: good skin turgor, no rashes, unusual bruising, or prominent lesions. Bunion on left foot. Neuro: No focal deficits, grossly normal sensation and coordination.  Psych: Oriented x3 and responding appropriately. Intact recent and remote memory, normal mood, judgement, affect , and insight.    Assessment & Plan:   See Encounters Tab for problem based charting.  Patient discussed with Dr. Hilbert Odor, D.O. [facility], PGY-2 Pager: [phone], Phone: [phone] Date 07/15/2020 Time 3:29 PM

## 2020-07-12 NOTE — Progress Notes (Deleted)
This is a Careers information officer Note.  The care of the patient was discussed with Dr. Marianna Payment and the assessment and plan was formulated with their assistance.    Subjective:   Patient ID: Ashley Long female   DOB: 1961-03-09 60 y.o.   MRN: [id number]  HPI: [name] is a 60 y.o. female with a past medical history of hypertension, peripheral artery disease, diabetes, CKD, and hyperlipidemia who presents today for a routine follow up.   1) HTN  - losartan 50 mg daily, clonidine 0.3 mg twice daily, carvedilol 25 mg twice daily, amlodipine 10 mg daily, and HCTZ 25 mg daily   2) PAD -atorvastatin 56m, ASA 820m 3) DM -canagliflozin 30071minsulin glargine, insulin lispro, sitagliptin - metformin  - A1C today ***   4) CKD   5) HLD -atorvastatin 45m35m Past Medical History:  Diagnosis Date  . AKI (acute kidney injury) (HCC)[place]/05/2017  . Chronic kidney disease (CKD), stage III (moderate) (HCC)   . DIABETES MELLITUS, TYPE II 06/02/2006   Last HBA1C 8.7. , check 3 times daily  . Hydronephrosis    sees Dr FostRoyce Macadami[place] ,  . HYPERLIPIDEMIA 06/02/2006   Qualifier: Diagnosis of  By: CohePatrice Paradise JonaRoderic Palau. HYPERTENSION 06/02/2006   Filed Vitals:   01/21/11 1134  BP: 162/98  Pulse: 80  Temp: 97.4 F (36.3 C)  TempSrc: Oral  Resp: 20  Height: 5' 8" (1.727 m)  Weight: 261 lb (118.389 kg)     . Injury   . Stage 2 chronic kidney disease 12/02/2017   now stage 3   . Wound of right leg    right abover ankle, patient report sshe sustained a week ago from today  (11-08-2018) after hitting her leg on an object , reports wound is scabbed over and her [facility]  some days ago and was not converned, patient denies redness or pain at sit eof wound    Current Outpatient Medications  Medication Sig Dispense Refill  . amLODipine (NORVASC) 10 MG tablet Take 1 tablet (10 mg total) by mouth at bedtime. 90 tablet 3  . aspirin (ASPIRIN ADULT LOW STRENGTH) 81 MG EC  tablet Take 1 tablet (81 mg total) by mouth daily. Swallow whole. 90 tablet 3  . atorvastatin (LIPITOR) 40 MG tablet Take 1 tablet (40 mg total) by mouth daily. 90 tablet 3  . Blood Glucose Monitoring Suppl (WAVESENSE PRESTO) W/DEVICE KIT 1 each by Does not apply route 2 (two) times daily. 1 each 0  . canagliflozin (INVOKANA) 300 MG TABS tablet TAKE 1 Tablet BY MOUTH ONCE EVERY DAY BEFORE BREAKFAST 30 tablet 5  . carvedilol (COREG) 25 MG tablet Take 1 tablet (25 mg total) by mouth 2 (two) times daily. 180 tablet 3  . cloNIDine (CATAPRES) 0.3 MG tablet Take 1 tablet (0.3 mg total) by mouth 2 (two) times daily. 180 tablet 3  . glucose blood (WAVESENSE PRESTO) test strip Use to test blood sugar 3 times daily. diag code E11.9. Insulin dependent 100 each 11  . hydrochlorothiazide (HYDRODIURIL) 25 MG tablet Take 1 tablet (25 mg total) by mouth daily. 30 tablet 5  . Insulin Glargine (BASAGLAR KWIKPEN) 100 UNIT/ML Inject 28 Units into the skin at bedtime. 15 mL 5  . insulin lispro (HUMALOG) 100 UNIT/ML KwikPen Inject 3-8 Units into the skin 3 (three) times daily. 15 mL 5  . Insulin Pen Needle (PEN NEEDLES) 31G  X 5 MM MISC 4 Doses/Fill by Does not apply route daily. 100 each 11  . Insulin Syringe-Needle U-100 31G X 15/64" 0.3 ML MISC 1 each by Does not apply route 2 (two) times daily with a meal. 100 each 11  . ketotifen (ALAWAY) 0.025 % ophthalmic solution Place 1 drop into both eyes daily as needed (allergies).    . Lancets 30G MISC 1 each by Does not apply route 2 (two) times daily. 100 each 12  . losartan (COZAAR) 50 MG tablet TAKE 1 Tablet BY MOUTH ONCE EVERY DAY 90 tablet 3  . magnesium oxide (MAG-OX) 400 MG tablet Take 400 mg by mouth 2 (two) times daily.    Marland Kitchen omeprazole (PRILOSEC) 40 MG capsule Take 1 capsule (40 mg total) by mouth daily. 90 capsule 3  . polyethylene glycol powder (MIRALAX) powder Take 17 g by mouth daily. (Patient not taking: Reported on 11/03/2018) 255 g 5  . sitaGLIPtin-metformin  (JANUMET) 50-1000 MG tablet Take 1 tablet by mouth 2 (two) times daily with a meal. 180 tablet 3  . sulfamethoxazole-trimethoprim (BACTRIM DS) 800-160 MG tablet Take 1 tablet by mouth 2 (two) times daily. 6 tablet 0   No current facility-administered medications for this visit.   Family History  Problem Relation Age of Onset  . Other Neg Hx    Social History   Socioeconomic History  . Marital status: Single    Spouse name: Not on file  . Number of children: Not on file  . Years of education: 69  . Highest education level: Not on file  Occupational History  . Occupation: Surveyor, quantity: [employer]  Tobacco Use  . Smoking status: Former Smoker    Quit date: 10/15/1982    Years since quitting: 37.7  . Smokeless tobacco: Never Used  Substance and Sexual Activity  . Alcohol use: No    Alcohol/week: 0.0 standard drinks  . Drug use: No  . Sexual activity: Not on file  Other Topics Concern  . Not on file  Social History Narrative   Financial assistance approved for 100% discount at [facility] and has [facility] card per Bonna Gains   02/12/10         Social Determinants of Health   Financial Resource Strain: Not on file  Food Insecurity: Not on file  Transportation Needs: Not on file  Physical Activity: Not on file  Stress: Not on file  Social Connections: Not on file   Review of Systems: {Review Of Systems:30496} Objective:  Physical Exam: There were no vitals filed for this visit. {Exam, Complete:17964} Assessment & Plan:  1. ***

## 2020-07-12 NOTE — Patient Instructions (Signed)
Thank you, Ashley Long for allowing [place] to provide your care today. Today we discussed Diabetes and blood pressure.    I have ordered the following labs for you:   Lab Orders     Glucose, capillary     POC Hbg A1C   Tests ordered today:  none  Referrals ordered today:   Referral Orders  No referral(s) requested today     Medication Changes:   There are no discontinued medications.   No orders of the defined types were placed in this encounter.  Instructions:   Follow up: 3 months   Remember:    Should you have any questions or concerns please call the internal medicine clinic at [phone].     Marianna Payment, D.O. [place]

## 2020-07-15 ENCOUNTER — Encounter: Payer: Self-pay | Admitting: Internal Medicine

## 2020-07-15 DIAGNOSIS — M21619 Bunion of unspecified foot: Secondary | ICD-10-CM | POA: Insufficient documentation

## 2020-07-15 MED ORDER — CHLORTHALIDONE 25 MG PO TABS: 25.0000 mg | ORAL_TABLET | Freq: Every day | ORAL | 2 refills | Status: DC

## 2020-07-15 NOTE — Assessment & Plan Note (Signed)
Referred to podiatry for bunion.

## 2020-07-15 NOTE — Assessment & Plan Note (Signed)
SUBJECTIVE: Patient presents for further evaluation and management of her DM.   Current DM medications:   1. Glargine 28 units 2. Janumet 50-1000mg   3. Lispro 3-8 units  4. Canagliflozin 300 mg   Medication Adherence: The patient reports adherence to this regimen.  Medication SE: none Barriers: none highlighted.   Monitoring: does have a home glucometer and is checking her blood sugar regularly DM S/Sx: Admits to none.  Exercise: admits to walking  Diet: tries to eat a stable diet   Labs: Glucose-Capillary  Date/Time Value Ref Range Status  07/12/2020 02:49 PM 194 (H) 70 - 99 mg/dL Final    Comment:    Glucose reference range applies only to samples taken after fasting for at least 8 hours.    Last A1c was  Lab Results  Component Value Date   HGBA1C 7.6 (A) 07/12/2020  .   ASSESSMENT: uncontrolled Type 2 Diabetes Mellitus with long term use of insulin with end-organ damage (nephropathy)   PLAN: 1. Counseling: A. Counseled regarding carb counting and gave handout. Instructed to measure CBG before and after meals B. Weight loss of five to seven percent of body weight at a rate of 0.5 to 1.0 kg per week (1 to 2 lb per week). C. Moderate-intensity aerobic exercise for 30-60 minutes most days. 2. Medications: continue current meds 3. Goals:  A. daily monitoring and recording of CBG B. CBG - 100-120  C. Hgb A1c: <7 4. Referrals: podiatry for bunion 5. Follow-up: 3 months

## 2020-07-15 NOTE — Assessment & Plan Note (Addendum)
Patient presents for reevaluation of HTN. She is on  losartan 50 mg daily, clonidine 0.3 mg twice daily, carvedilol 25 mg twice daily, amlodipine 10 mg daily, and HCTZ 25 mg. She denies any side effects. Her BP is as follows:   Recent blood pressures:  BP Readings from Last 3 Encounters:  07/12/20 140/70  05/16/20 140/76  04/05/20 120/80    Patient states that she is not interested in OSA screening as she would not want to wear the mask and does no think she has OSA.  Plan: 1. Switch her to chlorthalidone from HCTZ.  2. If she remains elevated consider starting Spironolactone.

## 2020-07-16 ENCOUNTER — Telehealth: Payer: Self-pay | Admitting: *Deleted

## 2020-07-16 ENCOUNTER — Other Ambulatory Visit: Payer: Self-pay

## 2020-07-16 DIAGNOSIS — I10 Essential (primary) hypertension: Secondary | ICD-10-CM

## 2020-07-16 NOTE — Telephone Encounter (Signed)
Pt had left a message on Lelan Pons Hague's vm about not getting new BP med. Per Dr Sammie Bench ov notes on 4/28 hctz was switched to chlorthalidone which was sent to [facility] on 5/1. I called pt - no answer; left message on self-identified vm about the medication; and to call back for any questions.

## 2020-07-16 NOTE — Telephone Encounter (Signed)
Requesting to speak with a nurse about meds, please call back.

## 2020-07-16 NOTE — Telephone Encounter (Signed)
Pt wanted to know name on "new bp medicine" Dr Marianna Payment started her on.   Per chart review-new rx was sent for Chlorthalidone rx was sent to [facility]-pt have not received it.   she was instructed to continue hctz until she receives it.   Pt had no further questions, phone call complete

## 2020-07-17 NOTE — Telephone Encounter (Addendum)
Call to [facility] to check on the status of new rx Chlorthalidone not available through [facility].  Rx is available through [facility]  CMA contacted [facility]-rx $35  Will have MD/team send new rx to [facility] ([facility])   Message left on pt's recorder for return call.Despina Hidden Cassady5/3/20223:43 PM  Pt now aware.Despina Hidden Cassady5/3/20224:17 PM

## 2020-07-17 NOTE — Addendum Note (Signed)
Addended by: Marcelino Duster on: 07/17/2020 04:21 PM   Modules accepted: Orders

## 2020-07-18 ENCOUNTER — Other Ambulatory Visit (HOSPITAL_COMMUNITY): Payer: Self-pay

## 2020-07-18 MED ORDER — CHLORTHALIDONE 25 MG PO TABS
25.0000 mg | ORAL_TABLET | Freq: Every day | ORAL | 2 refills | Status: DC
  Filled 2020-07-18: qty 30, 30d supply, fill #0
  Filled 2020-08-19: qty 30, 30d supply, fill #1
  Filled 2020-09-16: qty 30, 30d supply, fill #2

## 2020-07-20 NOTE — Progress Notes (Signed)
Internal Medicine Clinic Attending  Case discussed with Dr. Coe  At the time of the visit.  We reviewed the resident's history and exam and pertinent patient test results.  I agree with the assessment, diagnosis, and plan of care documented in the resident's note.  

## 2020-07-25 ENCOUNTER — Ambulatory Visit: Payer: No Typology Code available for payment source | Admitting: Podiatry

## 2020-07-25 NOTE — Addendum Note (Signed)
Addended by: Hulan Fray on: 07/25/2020 04:29 PM   Modules accepted: Orders

## 2020-08-06 ENCOUNTER — Other Ambulatory Visit: Payer: Self-pay

## 2020-08-06 ENCOUNTER — Ambulatory Visit (INDEPENDENT_AMBULATORY_CARE_PROVIDER_SITE_OTHER): Payer: No Typology Code available for payment source | Admitting: Podiatry

## 2020-08-06 DIAGNOSIS — M79675 Pain in left toe(s): Secondary | ICD-10-CM | POA: Diagnosis not present

## 2020-08-06 DIAGNOSIS — E0843 Diabetes mellitus due to underlying condition with diabetic autonomic (poly)neuropathy: Secondary | ICD-10-CM

## 2020-08-06 DIAGNOSIS — M79674 Pain in right toe(s): Secondary | ICD-10-CM

## 2020-08-06 DIAGNOSIS — B351 Tinea unguium: Secondary | ICD-10-CM | POA: Diagnosis not present

## 2020-08-06 MED ORDER — TERBINAFINE HCL 250 MG PO TABS: 250.0000 mg | ORAL_TABLET | Freq: Every day | ORAL | 0 refills | Status: AC

## 2020-08-10 ENCOUNTER — Other Ambulatory Visit: Payer: Self-pay | Admitting: Podiatry

## 2020-08-11 LAB — HEPATIC FUNCTION PANEL
ALT: 23 IU/L (ref 0–32)
AST: 18 IU/L (ref 0–40)
Albumin: 4.5 g/dL (ref 3.8–4.9)
Alkaline Phosphatase: 129 IU/L — ABNORMAL HIGH (ref 44–121)
Bilirubin Total: 0.4 mg/dL (ref 0.0–1.2)
Bilirubin, Direct: 0.14 mg/dL (ref 0.00–0.40)
Total Protein: 7.1 g/dL (ref 6.0–8.5)

## 2020-08-14 ENCOUNTER — Telehealth: Payer: Self-pay | Admitting: Podiatry

## 2020-08-14 NOTE — Telephone Encounter (Signed)
Patient called about her liver function test results, she wanted to know if they came back so that she can start taking her medicatin

## 2020-08-16 NOTE — Telephone Encounter (Signed)
Patient is calling back about the liver function test. Would like to know if she can go ahead and start medication. Please call asap

## 2020-08-20 ENCOUNTER — Other Ambulatory Visit (HOSPITAL_COMMUNITY): Payer: Self-pay

## 2020-08-20 NOTE — Progress Notes (Signed)
   Subjective: 60 y.o. female presenting today PMHx diabetes mellitus for evaluation of thickened dystrophic nails to the bilateral feet.  Patient states that especially her left great toenail is very thick and discolored with associated tenderness to palpation.  She is unable to trim her own nails.  She is looking for some treatment modalities to permanently alleviate some of the thickening and fungal nails.  Past Medical History:  Diagnosis Date  . AKI (acute kidney injury) ([place]) 07/17/2017  . Chronic kidney disease (CKD), stage III (moderate) (HCC)   . DIABETES MELLITUS, TYPE II 06/02/2006   Last HBA1C 8.7. , check 3 times daily  . Hydronephrosis    sees Dr Royce Macadamia at [facility] ,  . HYPERLIPIDEMIA 06/02/2006   Qualifier: Diagnosis of  By: Patrice Paradise MD, Roderic Palau    . HYPERTENSION 06/02/2006   Filed Vitals:   01/21/11 1134  BP: 162/98  Pulse: 80  Temp: 97.4 F (36.3 C)  TempSrc: Oral  Resp: 20  Height: 5\' 8"  (1.727 m)  Weight: 261 lb (118.389 kg)     . Injury   . Stage 2 chronic kidney disease 12/02/2017   now stage 3   . Wound of right leg    right abover ankle, patient report sshe sustained a week ago from today  (11-08-2018) after hitting her leg on an object , reports wound is scabbed over and her pcp checked it out [facility]  some days ago and was not converned, patient denies redness or pain at sit eof wound     Objective: Physical Exam General: The patient is alert and oriented x3 in no acute distress.  Dermatology: Hyperkeratotic, discolored, thickened, onychodystrophy noted 1-5 bilateral, most symptomatic to the left hallux nail plate. Skin is warm, dry and supple bilateral lower extremities. Negative for open lesions or macerations.  Vascular: Palpable pedal pulses bilaterally. No edema or erythema noted. Capillary refill within normal limits.  Neurological: Epicritic and protective threshold diminished bilaterally.   Musculoskeletal Exam: No pedal deformities  noted Assessment: #1 Onychomycosis of toenails bilateral #2  Diabetes mellitus with peripheral polyneuropathy  Plan of Care:  #1 Patient was evaluated. #2  Mechanical debridement of nails 1-5 bilateral was performed using a nail nipper without incident or bleeding #3 today we discussed different treatment options including oral, topical, and laser antifungal treatment modalities.  The patient opts for oral antifungal treatment #4 order placed for hepatic function panel #5 prescription for Lamisil 250 mg #90 daily.  Discontinue if hepatic function panel is abnormal #6 return to clinic 6 months   Edrick Kins, DPM [facility]  Dr. Edrick Kins, [address]                                        [place], [place] [address]                Office [phone]  Fax [phone]

## 2020-08-22 NOTE — Telephone Encounter (Signed)
Spoke with patient.

## 2020-08-22 NOTE — Telephone Encounter (Signed)
Patient is still waiting on a callback for results of liver function test. Please contact patient to inform her of results; ready to start medication for toenail fungus.

## 2020-09-18 ENCOUNTER — Other Ambulatory Visit (HOSPITAL_COMMUNITY): Payer: Self-pay

## 2020-09-25 ENCOUNTER — Other Ambulatory Visit: Payer: Self-pay | Admitting: Internal Medicine

## 2020-09-25 DIAGNOSIS — E119 Type 2 diabetes mellitus without complications: Secondary | ICD-10-CM

## 2020-09-25 DIAGNOSIS — I10 Essential (primary) hypertension: Secondary | ICD-10-CM

## 2020-10-01 ENCOUNTER — Other Ambulatory Visit: Payer: Self-pay | Admitting: Internal Medicine

## 2020-10-07 ENCOUNTER — Other Ambulatory Visit: Payer: Self-pay | Admitting: Student

## 2020-10-07 DIAGNOSIS — I10 Essential (primary) hypertension: Secondary | ICD-10-CM

## 2020-10-09 ENCOUNTER — Other Ambulatory Visit (HOSPITAL_COMMUNITY): Payer: Self-pay

## 2020-10-09 MED ORDER — CHLORTHALIDONE 25 MG PO TABS
25.0000 mg | ORAL_TABLET | Freq: Every day | ORAL | 2 refills | Status: DC
  Filled 2020-10-09: qty 30, 30d supply, fill #0
  Filled 2020-11-16: qty 30, 30d supply, fill #1
  Filled 2020-11-26: qty 30, 30d supply, fill #2

## 2020-10-15 ENCOUNTER — Encounter: Payer: Self-pay | Admitting: Internal Medicine

## 2020-10-15 ENCOUNTER — Ambulatory Visit (INDEPENDENT_AMBULATORY_CARE_PROVIDER_SITE_OTHER): Payer: Self-pay | Admitting: Internal Medicine

## 2020-10-15 ENCOUNTER — Other Ambulatory Visit: Payer: Self-pay

## 2020-10-15 VITALS — BP 138/88 | HR 76 | Temp 98.2°F | Ht 68.0 in | Wt 225.4 lb

## 2020-10-15 DIAGNOSIS — N1832 Chronic kidney disease, stage 3b: Secondary | ICD-10-CM

## 2020-10-15 DIAGNOSIS — E1122 Type 2 diabetes mellitus with diabetic chronic kidney disease: Secondary | ICD-10-CM

## 2020-10-15 DIAGNOSIS — I10 Essential (primary) hypertension: Secondary | ICD-10-CM

## 2020-10-15 DIAGNOSIS — Z794 Long term (current) use of insulin: Secondary | ICD-10-CM

## 2020-10-15 LAB — POCT GLYCOSYLATED HEMOGLOBIN (HGB A1C): Hemoglobin A1C: 7.9 % — AB (ref 4.0–5.6)

## 2020-10-15 LAB — GLUCOSE, CAPILLARY: Glucose-Capillary: 129 mg/dL — ABNORMAL HIGH (ref 70–99)

## 2020-10-15 NOTE — Progress Notes (Signed)
CC: DM  HPI:  [name] is a 60 y.o. female with a past medical history stated below and presents today for DM. Please see problem based assessment and plan for additional details.  Past Medical History:  Diagnosis Date   AKI (acute Long injury) ([place]) 07/17/2017   Chronic Long disease (CKD), stage III (moderate) (HCC)    DIABETES MELLITUS, TYPE II 06/02/2006   Last HBA1C 8.7. , check 3 times daily   Hydronephrosis    sees Dr Ashley Long at [facility] ,   [place] 06/02/2006   Qualifier: Diagnosis of  By: Ashley Paradise MD, Ashley Long     HYPERTENSION 06/02/2006   Filed Vitals:   01/21/11 1134  BP: 162/98  Pulse: 80  Temp: 97.4 F (36.3 C)  TempSrc: Oral  Resp: 20  Height: '5\' 8"'  (1.727 m)  Weight: 261 lb (118.389 kg)      Injury    Stage 2 chronic Long disease 12/02/2017   now stage 3    Wound of right leg    right abover ankle, patient report sshe sustained a week ago from today  (11-08-2018) after hitting her leg on an object , reports wound is scabbed over and her pcp checked it out at [facility]  some days ago and was not converned, patient denies redness or pain at sit eof wound     Current Outpatient Medications on File Prior to Visit  Medication Sig Dispense Refill   amLODipine (NORVASC) 10 MG tablet Take 1 tablet (10 mg total) by mouth at bedtime. 90 tablet 3   aspirin (ASPIRIN ADULT LOW STRENGTH) 81 MG EC tablet Take 1 tablet (81 mg total) by mouth daily. Swallow whole. 90 tablet 3   atorvastatin (LIPITOR) 40 MG tablet Take 1 tablet (40 mg total) by mouth daily. 90 tablet 3   Blood Glucose Monitoring Suppl (WAVESENSE PRESTO) W/DEVICE KIT 1 each by Does not apply route 2 (two) times daily. 1 each 0   carvedilol (COREG) 25 MG tablet Take 1 tablet (25 mg total) by mouth 2 (two) times daily. 180 tablet 3   chlorthalidone (HYGROTON) 25 MG tablet Take 1 tablet (25 mg total) by mouth daily 30 tablet 2   cloNIDine (CATAPRES) 0.3 MG tablet Take 1 tablet (0.3 mg total) by mouth 2  (two) times daily. 180 tablet 3   glucose blood (WAVESENSE PRESTO) test strip Use to test blood sugar 3 times daily. diag code E11.9. Insulin dependent 100 each 11   Insulin Glargine (BASAGLAR KWIKPEN) 100 UNIT/ML Inject 28 Units into the skin at bedtime. 15 mL 5   insulin lispro (HUMALOG) 100 UNIT/ML KwikPen Inject 3-8 Units into the skin 3 (three) times daily. 15 mL 5   Insulin Pen Needle (PEN NEEDLES) 31G X 5 MM MISC 4 Doses/Fill by Does not apply route daily. 100 each 11   Insulin Syringe-Needle U-100 31G X 15/64" 0.3 ML MISC 1 each by Does not apply route 2 (two) times daily with a meal. 100 each 11   INVOKANA 300 MG TABS tablet TAKE 1 Tablet BY MOUTH ONCE EVERY DAY BEFORE BREAKFAST 30 tablet 5   ketotifen (ALAWAY) 0.025 % ophthalmic solution Place 1 drop into both eyes daily as needed (allergies).     Lancets 30G MISC 1 each by Does not apply route 2 (two) times daily. 100 each 12   losartan (COZAAR) 50 MG tablet TAKE 1 Tablet BY MOUTH ONCE EVERY DAY 90 tablet 3   magnesium oxide (MAG-OX) 400 MG  tablet Take 400 mg by mouth 2 (two) times daily.     omeprazole (PRILOSEC) 40 MG capsule Take 1 capsule (40 mg total) by mouth daily. 90 capsule 3   polyethylene glycol powder (MIRALAX) powder Take 17 g by mouth daily. (Patient not taking: Reported on 11/03/2018) 255 g 5   sitaGLIPtin-metformin (JANUMET) 50-1000 MG tablet Take 1 tablet by mouth 2 (two) times daily with a meal. 180 tablet 3   sulfamethoxazole-trimethoprim (BACTRIM DS) 800-160 MG tablet Take 1 tablet by mouth 2 (two) times daily. 6 tablet 0   terbinafine (LAMISIL) 250 MG tablet Take 1 tablet (250 mg total) by mouth daily. 90 tablet 0   No current facility-administered medications on file prior to visit.    Family History  Problem Relation Age of Onset   Other Neg Hx     Social History   Socioeconomic History   Marital status: Single    Spouse name: Not on file   Number of children: Not on file   Years of education: 12    Highest education level: Not on file  Occupational History   Occupation: Surveyor, quantity: [facility]  Tobacco Use   Smoking status: Former    Types: Cigarettes    Quit date: 10/15/1982    Years since quitting: 38.0   Smokeless tobacco: Never  Substance and Sexual Activity   Alcohol use: No    Alcohol/week: 0.0 standard drinks   Drug use: No   Sexual activity: Not on file  Other Topics Concern   Not on file  Social History Narrative   Financial assistance approved for 100% discount at [facility] and has [facility] card per [employer]   02/12/10         Social Determinants of Health   Financial Resource Strain: Not on file  Food Insecurity: Not on file  Transportation Needs: Not on file  Physical Activity: Not on file  Stress: Not on file  Social Connections: Not on file  Intimate Partner Violence: Not on file    Review of Systems: ROS negative except for what is noted on the assessment and plan.  Vitals:   10/15/20 1521  BP: 138/88  Pulse: 76  Temp: 98.2 F (36.8 C)  TempSrc: Oral  SpO2: 100%  Weight: 225 lb 6.4 oz (102.2 kg)  Height: '5\' 8"'  (1.727 m)    Physical Exam: Gen: A&O x3 and in no apparent distress, well appearing and nourished. HEENT: Head - normocephalic, atraumatic. Eye -  visual acuity grossly intact, conjunctiva clear, sclera non-icteric, EOM intact. Mouth - No obvious caries or periodontal disease. Neck: no obvious masses or nodules, AROM intact. CV: RRR, no murmurs, rubs, or gallops. S1/S2 presents  Resp: Clear to ascultation bilaterally  Abd: BS (+) x4, soft, non-tender, without obvious hepatosplenomegaly or masses MSK: Grossly normal AROM and strength x4 extremities. Skin: good skin turgor, no rashes, unusual bruising, or prominent lesions.  Neuro: No focal deficits, grossly normal sensation and coordination.  Psych: Oriented x3 and responding appropriately. Intact recent and remote memory, normal mood, judgement, affect , and insight.     Assessment & Plan:   See Encounters Tab for problem based charting.  Patient discussed with Dr. Elyn Peers, D.O. [facility], PGY3 Pager: [phone], Phone: [phone] Date 10/15/2020 Time 4:50 PM

## 2020-10-15 NOTE — Assessment & Plan Note (Signed)
SandraAssessment: Patient presents for reevaluation of her resistant hypertension.  Previous evaluation for causes of hypertension were unrevealing.  Her blood pressure is 138/88 on carvedilol 25 mg twice daily amlodipine 10 mg daily, clonidine 0.3 mg twice daily, and losartan 50 mg daily.  If blood pressures remain elevated she would benefit from switching losartan to olmesartan in order to start her on a combination pill and starting spironolactone.   Plan: -Continue current antihypertensive medications

## 2020-10-15 NOTE — Assessment & Plan Note (Signed)
Assessment: Patient presents for reevaluation of her diabetes.  Her hemoglobin A1c was 7.9 today on glargine 28 units nightly, Janumet 50-1000 mg, lispro 3 to 8 units with meals, and canagliflozin 300 mg.  She denies any hypoglycemic episodes and states she is tolerating these medications well.  She denies any new lesions on her feet or diabetic neuropathy.  I counseled on importance of yearly eye exam.   Plan: -Continue current diabetic medication management

## 2020-10-15 NOTE — Patient Instructions (Signed)
Thank you, [name] for allowing [place] to provide your care today. Today we discussed Diabetes and blood presure.    I have ordered the following labs for you:   Lab Orders  Glucose, capillary  POC Hbg A1C    Tests ordered today:  none  Referrals ordered today:   Referral Orders  No referral(s) requested today     Medication Changes:   There are no discontinued medications.   No orders of the defined types were placed in this encounter.    Follow up: 3 months   Remember:    Should you have any questions or concerns please call the internal medicine clinic at [phone].     Marianna Payment, D.O. [place]

## 2020-10-16 NOTE — Progress Notes (Signed)
Internal Medicine Clinic Attending  Case discussed with Dr. Coe  At the time of the visit.  We reviewed the resident's history and exam and pertinent patient test results.  I agree with the assessment, diagnosis, and plan of care documented in the resident's note.  

## 2020-10-22 ENCOUNTER — Telehealth: Payer: Self-pay | Admitting: Internal Medicine

## 2020-10-22 DIAGNOSIS — Z794 Long term (current) use of insulin: Secondary | ICD-10-CM

## 2020-10-22 DIAGNOSIS — N1832 Chronic kidney disease, stage 3b: Secondary | ICD-10-CM

## 2020-10-22 NOTE — Telephone Encounter (Signed)
Pt calling to report [facility] contacted her about sch her Annual Diabetic Eye Exam. Pt is with the [facility]  [employer]) program and is requesting a new Referral be sent  as soon aspossible.  Please advise if a new referral can be placed.

## 2020-11-07 ENCOUNTER — Other Ambulatory Visit: Payer: Self-pay | Admitting: Obstetrics and Gynecology

## 2020-11-07 DIAGNOSIS — Z1231 Encounter for screening mammogram for malignant neoplasm of breast: Secondary | ICD-10-CM

## 2020-11-16 ENCOUNTER — Other Ambulatory Visit (HOSPITAL_COMMUNITY): Payer: Self-pay

## 2020-11-20 ENCOUNTER — Other Ambulatory Visit: Payer: Self-pay | Admitting: Obstetrics and Gynecology

## 2020-11-20 DIAGNOSIS — Z1231 Encounter for screening mammogram for malignant neoplasm of breast: Secondary | ICD-10-CM

## 2020-11-26 ENCOUNTER — Other Ambulatory Visit (HOSPITAL_COMMUNITY): Payer: Self-pay

## 2020-12-16 ENCOUNTER — Other Ambulatory Visit: Payer: Self-pay | Admitting: Internal Medicine

## 2020-12-16 DIAGNOSIS — I10 Essential (primary) hypertension: Secondary | ICD-10-CM

## 2020-12-18 ENCOUNTER — Other Ambulatory Visit (HOSPITAL_COMMUNITY): Payer: Self-pay

## 2020-12-18 MED ORDER — CHLORTHALIDONE 25 MG PO TABS
25.0000 mg | ORAL_TABLET | Freq: Every day | ORAL | 2 refills | Status: DC
  Filled 2020-12-18: qty 30, 30d supply, fill #0
  Filled 2021-01-15: qty 30, 30d supply, fill #1
  Filled 2021-02-19: qty 30, 30d supply, fill #2

## 2020-12-18 NOTE — Telephone Encounter (Signed)
Next appt scheduled 01/29/21 with PCP.

## 2020-12-28 ENCOUNTER — Other Ambulatory Visit: Payer: Self-pay | Admitting: Internal Medicine

## 2020-12-28 DIAGNOSIS — E119 Type 2 diabetes mellitus without complications: Secondary | ICD-10-CM

## 2020-12-31 NOTE — Telephone Encounter (Signed)
Next appt scheduled 01/29/21 with PCP.

## 2021-01-03 ENCOUNTER — Ambulatory Visit: Payer: No Typology Code available for payment source

## 2021-01-10 ENCOUNTER — Ambulatory Visit
Admission: RE | Admit: 2021-01-10 | Discharge: 2021-01-10 | Disposition: A | Discharge status: Home / Self Care | Payer: No Typology Code available for payment source | Source: Ambulatory Visit | Attending: Obstetrics and Gynecology | Admitting: Obstetrics and Gynecology

## 2021-01-10 ENCOUNTER — Ambulatory Visit: Payer: Self-pay | Admitting: *Deleted

## 2021-01-10 ENCOUNTER — Other Ambulatory Visit: Payer: Self-pay

## 2021-01-10 VITALS — BP 142/90 | Wt 219.5 lb

## 2021-01-10 DIAGNOSIS — Z1239 Encounter for other screening for malignant neoplasm of breast: Secondary | ICD-10-CM

## 2021-01-10 DIAGNOSIS — Z1231 Encounter for screening mammogram for malignant neoplasm of breast: Secondary | ICD-10-CM

## 2021-01-10 NOTE — Patient Instructions (Signed)
Explained breast self awareness with Ronaldo Miyamoto. Patient did not need a Pap smear today due to last Pap smear and HPV typing was 05/16/2020. Let her know [facility] will cover Pap smears and HPV typing every 5 years unless has a history of abnormal Pap smears. Referred patient to the [facility] for a screening mammogram on mobile unit. Appointment scheduled Thursday, January 10, 2021 at 1550. Patient escorted to the mobile unit following [facility] appointment for her screening mammogram. Let patient know the [facility] will follow up with her within the next couple weeks with results of her mammogram by letter or phone. Ronaldo Miyamoto verbalized understanding.  Levante Simones, Arvil Ch[place], RN 2:50 PM

## 2021-01-10 NOTE — Progress Notes (Signed)
[name] is a 60 y.o. female who presents to [facility] clinic today with no complaints.    Pap Smear: Pap smear not completed today. Last Pap smear was 05/16/2020 at [facility] and was normal with negative HPV. Per patient has history of a Pap smear that was normal with positive HPV 02/09/2012 that no follow-up was completed. Last Pap smear result is available in Epic.   Physical exam: Breasts Right breast larger than the left breast that per patient is normal for her. No skin abnormalities bilateral breasts. No nipple retraction bilateral breasts. No nipple discharge bilateral breasts. No lymphadenopathy. No lumps palpated bilateral breasts. No complaints of pain or tenderness on exam.      MM DIGITAL SCREENING BILATERAL  Result Date: 07/11/2016 CLINICAL DATA:  Screening. EXAM: DIGITAL SCREENING BILATERAL MAMMOGRAM WITH CAD COMPARISON:  Previous exam(s). ACR Breast Density Category a: The breast tissue is almost entirely fatty. FINDINGS: There are no findings suspicious for malignancy. Images were processed with CAD. IMPRESSION: No mammographic evidence of malignancy. A result letter of this screening mammogram will be mailed directly to the patient. RECOMMENDATION: Screening mammogram in one year. (Code:SM-B-01Y) BI-RADS CATEGORY  1: Negative. Electronically Signed   By: Ashley Long M.D.   On: 07/11/2016 08:28   MS DIGITAL SCREENING TOMO BILATERAL  Result Date: 11/02/2019 CLINICAL DATA:  Screening. EXAM: DIGITAL SCREENING BILATERAL MAMMOGRAM WITH TOMO AND CAD COMPARISON:  Previous exam(s). ACR Breast Density Category a: The breast tissue is almost entirely fatty. FINDINGS: There are no findings suspicious for malignancy. Images were processed with CAD. IMPRESSION: No mammographic evidence of malignancy. A result letter of this screening mammogram will be mailed directly to the patient. RECOMMENDATION: Screening mammogram in one year. (Code:SM-B-01Y) BI-RADS  CATEGORY  1: Negative. Electronically Signed   By: Ashley Long M.D.   On: 11/02/2019 15:38   MS DIGITAL SCREENING TOMO BILATERAL  Result Date: 09/28/2018 CLINICAL DATA:  Screening. EXAM: DIGITAL SCREENING BILATERAL MAMMOGRAM WITH TOMO AND CAD COMPARISON:  Previous exam(s). ACR Breast Density Category b: There are scattered areas of fibroglandular density. FINDINGS: There are no findings suspicious for malignancy. Images were processed with CAD. IMPRESSION: No mammographic evidence of malignancy. A result letter of this screening mammogram will be mailed directly to the patient. RECOMMENDATION: Screening mammogram in one year. (Code:SM-B-01Y) BI-RADS CATEGORY  1: Negative. Electronically Signed   By: Ashley Long M.D.   On: 09/28/2018 09:25   MS DIGITAL SCREENING TOMO BILATERAL  Result Date: 07/23/2017 CLINICAL DATA:  Screening. EXAM: DIGITAL SCREENING BILATERAL MAMMOGRAM WITH TOMO AND CAD COMPARISON:  Previous exam(s). ACR Breast Density Category b: There are scattered areas of fibroglandular density. FINDINGS: There are no findings suspicious for malignancy. Images were processed with CAD. IMPRESSION: No mammographic evidence of malignancy. A result letter of this screening mammogram will be mailed directly to the patient. RECOMMENDATION: Screening mammogram in one year. (Code:SM-B-01Y) BI-RADS CATEGORY  1: Negative. Electronically Signed   By: Dorise Bullion Long M.D   On: 07/23/2017 18:28     Pelvic/Bimanual Pap is not indicated today per BCCCP guidelines.   Smoking History: Patient has never smoked.   Patient Navigation: Patient education provided. Access to services provided for patient through [facility] program.   Colorectal Cancer Screening: Per patient has had colonoscopy completed on 11/09/2018 and 03/29/2013 at [place].  No complaints today.    Breast and Cervical Cancer Risk Assessment: Patient does not have family history of breast cancer, known genetic mutations,  or radiation treatment  to the chest before age 74. Patient does not have history of cervical dysplasia, immunocompromised, or DES exposure in-utero.  Risk Assessment     Risk Scores       01/10/2021   Last edited by: Ashley Revel, LPN   5-year risk: 1.4 %   Lifetime risk: 7.3 %           A: BCCCP exam without pap smear No complaints.  P: Referred patient to the [place] for a screening mammogram on mobile unit. Appointment scheduled Thursday, January 10, 2021 at 1550.  Loletta Parish, RN 01/10/2021 2:50 PM

## 2021-01-15 ENCOUNTER — Other Ambulatory Visit (HOSPITAL_COMMUNITY): Payer: Self-pay

## 2021-01-21 LAB — HM DIABETES EYE EXAM

## 2021-01-29 ENCOUNTER — Ambulatory Visit (INDEPENDENT_AMBULATORY_CARE_PROVIDER_SITE_OTHER): Payer: Self-pay | Admitting: Internal Medicine

## 2021-01-29 ENCOUNTER — Other Ambulatory Visit: Payer: Self-pay

## 2021-01-29 VITALS — BP 120/82 | HR 76 | Temp 98.2°F | Ht 68.0 in | Wt 221.6 lb

## 2021-01-29 DIAGNOSIS — Z794 Long term (current) use of insulin: Secondary | ICD-10-CM

## 2021-01-29 DIAGNOSIS — N1832 Chronic kidney disease, stage 3b: Secondary | ICD-10-CM

## 2021-01-29 DIAGNOSIS — K5900 Constipation, unspecified: Secondary | ICD-10-CM

## 2021-01-29 DIAGNOSIS — Z23 Encounter for immunization: Secondary | ICD-10-CM

## 2021-01-29 DIAGNOSIS — N1831 Chronic kidney disease, stage 3a: Secondary | ICD-10-CM

## 2021-01-29 DIAGNOSIS — Z299 Encounter for prophylactic measures, unspecified: Secondary | ICD-10-CM

## 2021-01-29 DIAGNOSIS — E1122 Type 2 diabetes mellitus with diabetic chronic kidney disease: Secondary | ICD-10-CM

## 2021-01-29 DIAGNOSIS — E785 Hyperlipidemia, unspecified: Secondary | ICD-10-CM

## 2021-01-29 DIAGNOSIS — I129 Hypertensive chronic kidney disease with stage 1 through stage 4 chronic kidney disease, or unspecified chronic kidney disease: Secondary | ICD-10-CM

## 2021-01-29 DIAGNOSIS — I10 Essential (primary) hypertension: Secondary | ICD-10-CM

## 2021-01-29 DIAGNOSIS — I739 Peripheral vascular disease, unspecified: Secondary | ICD-10-CM

## 2021-01-29 LAB — POCT GLYCOSYLATED HEMOGLOBIN (HGB A1C): Hemoglobin A1C: 7.9 % — AB (ref 4.0–5.6)

## 2021-01-29 LAB — GLUCOSE, CAPILLARY: Glucose-Capillary: 163 mg/dL — ABNORMAL HIGH (ref 70–99)

## 2021-01-29 NOTE — Progress Notes (Signed)
Subjective:  CC: Diabetes Mellitus  HPI:  [name] is a 60 y.o. female with a past medical history stated below and presents today for DM. Please see problem based assessment and plan for additional details.  Past Medical History:  Diagnosis Date   AKI (acute kidney injury) ([place]) 07/17/2017   Chronic kidney disease (CKD), stage III (moderate) (HCC)    DIABETES MELLITUS, TYPE II 06/02/2006   Last HBA1C 8.7. , check 3 times daily   Hydronephrosis    sees Dr Ashley Long at [facility] ,   [place] 06/02/2006   Qualifier: Diagnosis of  By: Ashley Paradise MD, Ashley Long     HYPERTENSION 06/02/2006   Filed Vitals:   01/21/11 1134  BP: 162/98  Pulse: 80  Temp: 97.4 F (36.3 C)  TempSrc: Oral  Resp: 20  Height: _0  (1.727 m)  Weight: 261 lb (118.389 kg)      Injury    Stage 2 chronic kidney disease 12/02/2017   now stage 3    Wound of right leg    right abover ankle, patient report sshe sustained a week ago from today  (11-08-2018) after hitting her leg on an object , reports wound is scabbed over and her pcp checked it out at [facility]  some days ago and was not converned, patient denies redness or pain at sit eof wound     Current Outpatient Medications on File Prior to Visit  Medication Sig Dispense Refill   chlorthalidone (HYGROTON) 25 MG tablet Take 1 tablet (25 mg total) by mouth daily 30 tablet 2   amLODipine (NORVASC) 10 MG tablet Take 1 tablet (10 mg total) by mouth at bedtime. 90 tablet 3   aspirin (ASPIRIN ADULT LOW STRENGTH) 81 MG EC tablet Take 1 tablet (81 mg total) by mouth daily. Swallow whole. 90 tablet 3   atorvastatin (LIPITOR) 40 MG tablet Take 1 tablet (40 mg total) by mouth daily. 90 tablet 3   Blood Glucose Monitoring Suppl (WAVESENSE PRESTO) W/DEVICE KIT 1 each by Does not apply route 2 (two) times daily. 1 each 0   carvedilol (COREG) 25 MG tablet Take 1 tablet (25 mg total) by mouth 2 (two) times daily. 180 tablet 3   cloNIDine (CATAPRES) 0.3 MG tablet Take 1  tablet (0.3 mg total) by mouth 2 (two) times daily. 180 tablet 3   glucose blood (WAVESENSE PRESTO) test strip Use to test blood sugar 3 times daily. diag code E11.9. Insulin dependent 100 each 11   HUMALOG KWIKPEN 100 UNIT/ML KwikPen INJECT 3 TO 8 UNITS UNDER THE SKIN 3 TIMES DAILY 15 mL 5   Insulin Glargine (BASAGLAR KWIKPEN) 100 UNIT/ML INJECT 14 UNITS UNDER THE SKIN TWICE DAILY 15 mL 5   Insulin Pen Needle (PEN NEEDLES) 31G X 5 MM MISC 4 Doses/Fill by Does not apply route daily. 100 each 11   Insulin Syringe-Needle U-100 31G X 15/64" 0.3 ML MISC 1 each by Does not apply route 2 (two) times daily with a meal. 100 each 11   INVOKANA 300 MG TABS tablet TAKE 1 Tablet BY MOUTH ONCE EVERY DAY BEFORE BREAKFAST 30 tablet 5   ketotifen (ALAWAY) 0.025 % ophthalmic solution Place 1 drop into both eyes daily as needed (allergies).     Lancets 30G MISC 1 each by Does not apply route 2 (two) times daily. 100 each 12   losartan (COZAAR) 50 MG tablet TAKE 1 Tablet BY MOUTH ONCE EVERY DAY 90 tablet 3   magnesium  oxide (MAG-OX) 400 MG tablet Take 400 mg by mouth 2 (two) times daily.     omeprazole (PRILOSEC) 40 MG capsule Take 1 capsule (40 mg total) by mouth daily. 90 capsule 3   polyethylene glycol powder (MIRALAX) powder Take 17 g by mouth daily. (Patient not taking: Reported on 11/03/2018) 255 g 5   sitaGLIPtin-metformin (JANUMET) 50-1000 MG tablet Take 1 tablet by mouth 2 (two) times daily with a meal. 180 tablet 3   sulfamethoxazole-trimethoprim (BACTRIM DS) 800-160 MG tablet Take 1 tablet by mouth 2 (two) times daily. 6 tablet 0   No current facility-administered medications on file prior to visit.    Family History  Problem Relation Age of Onset   Diabetes Mother    Other Neg Hx     Social History   Socioeconomic History   Marital status: Single    Spouse name: Not on file   Number of children: 1   Years of education: 12   Highest education level: High school graduate  Occupational History    Occupation: Surveyor, quantity: [employer]  Tobacco Use   Smoking status: Former    Types: Cigarettes    Quit date: 10/15/1982    Years since quitting: 38.3   Smokeless tobacco: Never  Vaping Use   Vaping Use: Never used  Substance and Sexual Activity   Alcohol use: No    Alcohol/week: 0.0 standard drinks   Drug use: No   Sexual activity: Not Currently  Other Topics Concern   Not on file  Social History Narrative   Financial assistance approved for 100% discount at [facility] and has [facility] card per [employer]   02/12/10         Social Determinants of Health   Financial Resource Strain: Not on file  Food Insecurity: No Food Insecurity   Worried About [place] in the Last Year: Never true   Ran Out of Food in the Last Year: Never true  Transportation Needs: No Transportation Needs   Lack of Transportation (Medical): No   Lack of Transportation (Non-Medical): No  Physical Activity: Not on file  Stress: Not on file  Social Connections: Not on file  Intimate Partner Violence: Not on file    Review of Systems: ROS negative except for what is noted on the assessment and plan.  Objective:   Vitals:   01/29/21 1442  BP: 120/82  Pulse: 76  Temp: 98.2 F (36.8 C)  TempSrc: Oral  SpO2: 98%  Weight: 221 lb 9.6 oz (100.5 kg)  Height: _0  (1.727 m)    Physical Exam: Gen: A&O x3 and in no apparent distress, well appearing and nourished. HEENT:    Head - normocephalic, atraumatic.    Eye - visual acuity grossly intact, conjunctiva clear, sclera non-icteric, EOM intact.    Mouth - No obvious caries or periodontal disease. Neck: no masses or nodules, AROM intact. CV: RRR, no murmurs, S1/S2 presents  Resp: Clear to ascultation bilaterally  Abd: BS (+) x4, soft, non-tender abdomen, without hepatosplenomegaly or masses MSK: Grossly normal AROM and strength x4 extremities. Skin: good skin turgor, no rashes, unusual bruising, or prominent lesions.  Neuro:  No focal deficits, grossly normal sensation and coordination.  Psych: Oriented x3 and responding appropriately. Intact memory, normal mood, judgement, affect, and insight.    Assessment & Plan:  See Encounters Tab for problem based charting.  Patient discussed with Dr.  Lacretia Nicks, D.O. [place]  Internal Medicine  PGY-3 Pager: [phone]  Phone: [phone] Date 01/30/2021  Time 10:32 AM

## 2021-01-29 NOTE — Patient Instructions (Signed)
Thank you, Ms.Ronaldo Miyamoto for allowing [place] to provide your care today. Today we discussed Diabetes, Blood pressure Cholesterol.    Labs/Tests Ordered:  Lab Orders         Glucose, capillary         BMP8+Anion Gap         Lipid Profile         Microalbumin / Creatinine Urine Ratio         POC Hbg A1C      Referrals Ordered:  Referral Orders  No referral(s) requested today     Medication Changes:  There are no discontinued medications.   No orders of the defined types were placed in this encounter.    Health Maintenance Screening: Diabetes Health Maintenance Due  Topic Date Due   LIPID PANEL  04/19/2020   OPHTHALMOLOGY EXAM  09/07/2020   FOOT EXAM  03/22/2021   HEMOGLOBIN A1C  05/01/2021     Instructions:   Follow up: 3 months   Remember: If you have any questions or concerns, call our clinic at [phone] or after hours call [phone] and ask for the internal medicine resident on call.  Marianna Payment, D.O. [place]

## 2021-01-30 ENCOUNTER — Encounter: Payer: Self-pay | Admitting: Internal Medicine

## 2021-01-30 LAB — MICROALBUMIN / CREATININE URINE RATIO
Creatinine, Urine: 225.3 mg/dL
Microalb/Creat Ratio: 44 mg/g creat — ABNORMAL HIGH (ref 0–29)
Microalbumin, Urine: 99.8 ug/mL

## 2021-01-30 LAB — LIPID PANEL
Chol/HDL Ratio: 2.5 ratio (ref 0.0–4.4)
Cholesterol, Total: 114 mg/dL (ref 100–199)
HDL: 46 mg/dL (ref >39)
LDL Chol Calc (NIH): 40 mg/dL (ref 0–99)
Triglycerides: 166 mg/dL — ABNORMAL HIGH (ref 0–149)
VLDL Cholesterol Cal: 28 mg/dL (ref 5–40)

## 2021-01-30 MED ORDER — POLYETHYLENE GLYCOL 3350 17 GM/SCOOP PO POWD: 17.0000 g | Freq: Every day | ORAL | 2 refills | Status: AC

## 2021-01-30 NOTE — Assessment & Plan Note (Signed)
Patient continues to have intermittent constipation that improved with MiraLAX.  Will prescribe MiraLAX today.

## 2021-01-30 NOTE — Assessment & Plan Note (Signed)
Patient presents for further evaluation and management of her hypertension.  She is currently on multiple antihypertensive medications including chlorthalidone 25 mg, carvedilol 25 mg twice daily, amlodipine 10 mg daily, clonidine 0.3 mg twice daily, and losartan 50 mg daily.  She states that she is tolerating these medications well without side effects.  Her most recent blood pressure readings are listed below.  BP Readings from Last 3 Encounters:  01/29/21 120/82  01/10/21 (!) 142/90  10/15/20 138/88   Plan: -We will get BMP today to assess kidney function and electrolytes -Continue current antihypertensive medications. -At goal of less than 130/90

## 2021-01-30 NOTE — Assessment & Plan Note (Signed)
Patient has a history of hyperlipidemia on atorvastatin 40 mg daily.  She is tolerating this medication well.  Has not had a lipid panel in over a year.  Plan: -Continue atorvastatin 40 mg -Lipid panel today

## 2021-01-30 NOTE — Assessment & Plan Note (Signed)
Presents for further evaluation management of her diabetes.  She is currently taking canagliflozin 300 mg , glargine 14 units twice daily, Humalog 3-8 units 3 times daily, Janumet 50-1000 mg.  She is tolerating these medications well and is checking her blood sugar 3-4 times daily. She recently had an eye exam a week ago without evidence of diabetic retinopathy.  She is up-to-date with her diabetic foot exams without any significant lower extremity neuropathy noted.  She tries to exercise as much as possible but works a full-time job which can make it difficult.  Most recent A1c is listed below.  Hemoglobin A1C Latest Ref Rng & Units 01/29/2021 10/15/2020 07/12/2020 03/22/2020  HGBA1C 4.0 - 5.6 % 7.9(A) 7.9(A) 7.6(A) 7.7(A)  Some recent data might be hidden   Plan: -Continue current diabetic medication management -Encourage patient to exercise 30 to 45 minutes of moderate intensity activity on most days -Discussed the importance of fasting blood sugar checks and commended her for her continued progress -We will get BMP and urine microalbumin to assess kidney function -Repeat hemoglobin A1c today -Follow-up in 3 months

## 2021-01-30 NOTE — Assessment & Plan Note (Signed)
Patient will get pneumonia vaccine and flu vaccine today.  Discussed getting zoster vaccine at her pharmacy.

## 2021-01-30 NOTE — Addendum Note (Signed)
Addended by: Lawerance Cruel on: 01/30/2021 01:30 PM   Modules accepted: Orders

## 2021-01-31 LAB — BMP8+ANION GAP
Anion Gap: 17 mmol/L (ref 10.0–18.0)
BUN/Creatinine Ratio: 17 (ref 9–23)
BUN: 25 mg/dL — ABNORMAL HIGH (ref 6–24)
CO2: 24 mmol/L (ref 20–29)
Calcium: 9.6 mg/dL (ref 8.7–10.2)
Chloride: 97 mmol/L (ref 96–106)
Creatinine, Ser: 1.49 mg/dL — ABNORMAL HIGH (ref 0.57–1.00)
Glucose: 124 mg/dL — ABNORMAL HIGH (ref 70–99)
Potassium: 3.7 mmol/L (ref 3.5–5.2)
Sodium: 138 mmol/L (ref 134–144)
eGFR: 40 mL/min/{1.73_m2} — ABNORMAL LOW (ref >59)

## 2021-02-01 NOTE — Progress Notes (Signed)
Internal Medicine Clinic Attending  Case discussed with Dr. Coe  At the time of the visit.  We reviewed the resident's history and exam and pertinent patient test results.  I agree with the assessment, diagnosis, and plan of care documented in the resident's note.  

## 2021-02-13 ENCOUNTER — Encounter: Payer: Self-pay | Admitting: Dietician

## 2021-02-14 NOTE — Addendum Note (Signed)
Addended by: Lawerance Cruel on: 02/14/2021 03:12 PM   Modules accepted: Orders

## 2021-02-19 ENCOUNTER — Other Ambulatory Visit (HOSPITAL_COMMUNITY): Payer: Self-pay

## 2021-03-24 ENCOUNTER — Other Ambulatory Visit: Payer: Self-pay | Admitting: Internal Medicine

## 2021-03-24 DIAGNOSIS — I10 Essential (primary) hypertension: Secondary | ICD-10-CM

## 2021-03-27 ENCOUNTER — Other Ambulatory Visit: Payer: Self-pay | Admitting: Internal Medicine

## 2021-03-27 ENCOUNTER — Other Ambulatory Visit (HOSPITAL_COMMUNITY): Payer: Self-pay

## 2021-03-27 DIAGNOSIS — I10 Essential (primary) hypertension: Secondary | ICD-10-CM

## 2021-03-27 MED ORDER — CHLORTHALIDONE 25 MG PO TABS
25.0000 mg | ORAL_TABLET | Freq: Every day | ORAL | 2 refills | Status: DC
  Filled 2021-03-27: qty 30, 30d supply, fill #0
  Filled 2021-04-14: qty 30, 30d supply, fill #1
  Filled 2021-05-22: qty 30, 30d supply, fill #2

## 2021-04-02 ENCOUNTER — Other Ambulatory Visit: Payer: Self-pay | Admitting: Student

## 2021-04-02 DIAGNOSIS — E119 Type 2 diabetes mellitus without complications: Secondary | ICD-10-CM

## 2021-04-02 DIAGNOSIS — I10 Essential (primary) hypertension: Secondary | ICD-10-CM

## 2021-04-02 DIAGNOSIS — K219 Gastro-esophageal reflux disease without esophagitis: Secondary | ICD-10-CM

## 2021-04-03 ENCOUNTER — Other Ambulatory Visit: Payer: Self-pay

## 2021-04-03 DIAGNOSIS — I10 Essential (primary) hypertension: Secondary | ICD-10-CM

## 2021-04-03 DIAGNOSIS — E119 Type 2 diabetes mellitus without complications: Secondary | ICD-10-CM

## 2021-04-03 DIAGNOSIS — N183 Chronic kidney disease, stage 3 unspecified: Secondary | ICD-10-CM

## 2021-04-03 MED ORDER — OMEPRAZOLE 40 MG PO CPDR: 40.0000 mg | DELAYED_RELEASE_CAPSULE | Freq: Every day | ORAL | 3 refills | Status: DC

## 2021-04-03 MED ORDER — CLONIDINE HCL 0.3 MG PO TABS: 0.3000 mg | ORAL_TABLET | Freq: Two times a day (BID) | ORAL | 3 refills | Status: DC

## 2021-04-05 MED ORDER — CARVEDILOL 25 MG PO TABS: 25.0000 mg | ORAL_TABLET | Freq: Two times a day (BID) | ORAL | 3 refills | Status: DC

## 2021-04-05 MED ORDER — LOSARTAN POTASSIUM 50 MG PO TABS: ORAL_TABLET | ORAL | 3 refills | Status: DC

## 2021-04-05 MED ORDER — JANUMET 50-1000 MG PO TABS: 1.0000 | ORAL_TABLET | Freq: Two times a day (BID) | ORAL | 3 refills | Status: DC

## 2021-04-05 MED ORDER — ATORVASTATIN CALCIUM 40 MG PO TABS: 40.0000 mg | ORAL_TABLET | Freq: Every day | ORAL | 3 refills | Status: DC

## 2021-04-05 MED ORDER — AMLODIPINE BESYLATE 10 MG PO TABS: 10.0000 mg | ORAL_TABLET | Freq: Every day | ORAL | 3 refills | Status: DC

## 2021-04-05 MED ORDER — ASPIRIN 81 MG PO TBEC: 81.0000 mg | DELAYED_RELEASE_TABLET | Freq: Every day | ORAL | 3 refills | Status: DC

## 2021-04-08 ENCOUNTER — Ambulatory Visit (INDEPENDENT_AMBULATORY_CARE_PROVIDER_SITE_OTHER): Payer: Self-pay | Admitting: Student

## 2021-04-08 ENCOUNTER — Encounter: Payer: Self-pay | Admitting: Student

## 2021-04-08 VITALS — BP 130/80 | HR 70 | Temp 98.2°F | Ht 68.0 in | Wt 222.2 lb

## 2021-04-08 DIAGNOSIS — E1122 Type 2 diabetes mellitus with diabetic chronic kidney disease: Secondary | ICD-10-CM

## 2021-04-08 DIAGNOSIS — Z794 Long term (current) use of insulin: Secondary | ICD-10-CM

## 2021-04-08 DIAGNOSIS — N1832 Chronic kidney disease, stage 3b: Secondary | ICD-10-CM

## 2021-04-08 DIAGNOSIS — N6489 Other specified disorders of breast: Secondary | ICD-10-CM

## 2021-04-08 DIAGNOSIS — Z299 Encounter for prophylactic measures, unspecified: Secondary | ICD-10-CM

## 2021-04-08 NOTE — Patient Instructions (Signed)
It was a pleasure seeing you in clinic. Today we discussed:   Bruise on the R breast: the nodule on the breast is most likely due to bruising and should improve in several weeks to a month. You can take tylenol as needed for pain and use hot compress as well. If not improving please follow up as you may need further workup.   If you have any questions or concerns, please call our clinic at [phone] between 9am-5pm and after hours call [phone] and ask for the internal medicine resident on call. If you feel you are having a medical emergency please call 911.   Thank you, we look forward to helping you remain healthy!

## 2021-04-10 DIAGNOSIS — N6489 Other specified disorders of breast: Secondary | ICD-10-CM | POA: Insufficient documentation

## 2021-04-10 HISTORY — DX: Other specified disorders of breast: N64.89

## 2021-04-10 NOTE — Assessment & Plan Note (Signed)
Patient reports walking to the corner of her dresser 2 days ago.  Now has a 2 to 3 cm bruise on the right breast at the 2 to 3 o'clock position with some underlying firmness to palpation.  States that this lump was not present prior to her bumping into the dressing.  Likely that likely this from is due to hematoma from trauma to the right breast.  States she is not having much pain or discomfort from this.  Last mammogram was in October which was negative.  Denies personal or familial history of breast cancer.  She can use Tylenol and warm compresses as needed for pain anticipate this will improve in 4 to 6 weeks.  If not improving in 4 to 6 weeks would consider further imaging.

## 2021-04-10 NOTE — Progress Notes (Signed)
° °  CC: Right breast lump  HPI:  [name] is a 61 y.o.   Past Medical History:  Diagnosis Date   AKI (acute kidney injury) ([place]) 07/17/2017   Chronic kidney disease (CKD), stage III (moderate) (HCC)    DIABETES MELLITUS, TYPE II 06/02/2006   Last HBA1C 8.7. , check 3 times daily   Hydronephrosis    sees Dr Royce Macadamia at [facility] ,   [employer] 06/02/2006   Qualifier: Diagnosis of  By: Patrice Paradise MD, Roderic Palau     HYPERTENSION 06/02/2006   Filed Vitals:   01/21/11 1134  BP: 162/98  Pulse: 80  Temp: 97.4 F (36.3 C)  TempSrc: Oral  Resp: 20  Height: 5\' 8"  (1.727 m)  Weight: 261 lb (118.389 kg)      Injury    Stage 2 chronic kidney disease 12/02/2017   now stage 3    Wound of right leg    right abover ankle, patient report sshe sustained a week ago from today  (11-08-2018) after hitting her leg on an object , reports wound is scabbed over and her pcp checked it out at [facility]  some days ago and was not converned, patient denies redness or pain at sit eof wound    Review of Systems:  Negative as per hPI  Physical Exam:  Vitals:   04/08/21 1511 04/08/21 1552  BP: 140/90 130/80  Pulse: 72 70  Temp: 98.2 F (36.8 C)   TempSrc: Oral   SpO2: 100%   Weight: 222 lb 3.2 oz (100.8 kg)   Height: 5\' 8"  (1.727 m)    Physical Exam Constitutional:      Appearance: Normal appearance.  HENT:     Mouth/Throat:     Mouth: Mucous membranes are moist.     Pharynx: Oropharynx is clear.  Cardiovascular:     Rate and Rhythm: Normal rate and regular rhythm.  Pulmonary:     Effort: Pulmonary effort is normal.     Breath sounds: No rhonchi or rales.  Chest:     Chest wall: Swelling, tenderness and edema present. Mass: 2cm palpible mass. Breasts:    Right: Skin change (ecchymosis) and tenderness (minimal) present. No bleeding, inverted nipple or nipple discharge.     Left: Normal.    Abdominal:     General: Abdomen is flat. Bowel sounds are normal. There is no distension.      Palpations: Abdomen is soft.     Tenderness: There is no abdominal tenderness.  Musculoskeletal:        General: Normal range of motion.     Cervical back: Normal range of motion. No rigidity.     Right lower leg: No edema.     Left lower leg: No edema.  Lymphadenopathy:     Cervical: No cervical adenopathy.     Upper Body:     Right upper body: No supraclavicular, axillary or pectoral adenopathy.  Skin:    General: Skin is warm and dry.     Capillary Refill: Capillary refill takes less than 2 seconds.  Neurological:     General: No focal deficit present.     Mental Status: She is alert and oriented to person, place, and time.  Psychiatric:        Mood and Affect: Mood normal.        Behavior: Behavior normal.    Assessment & Plan:   See Encounters Tab for problem based charting.  Patient discussed with Dr. Dareen Piano

## 2021-04-10 NOTE — Assessment & Plan Note (Signed)
Diabetic foot exam performed today.  Good sensation no ulcerations or skin breakdown.  Pulses 2+ and symmetric.  Reports compliance with medications including canagliflozin 300 mg daily, Basaglar 14 units daily, Humalog 3 times a day, Janumet 50-1000 mg twice daily.  States blood sugars at home have been reasonably well controlled.  Due for A1c at her next visit in March.

## 2021-04-10 NOTE — Assessment & Plan Note (Deleted)
Inf

## 2021-04-15 ENCOUNTER — Other Ambulatory Visit (HOSPITAL_COMMUNITY): Payer: Self-pay

## 2021-04-18 NOTE — Progress Notes (Signed)
Internal Medicine Clinic Attending ? ?Case discussed with Dr. Liang  At the time of the visit.  We reviewed the resident?s history and exam and pertinent patient test results.  I agree with the assessment, diagnosis, and plan of care documented in the resident?s note. ? ?

## 2021-05-13 LAB — HM DIABETES EYE EXAM

## 2021-05-22 ENCOUNTER — Other Ambulatory Visit (HOSPITAL_COMMUNITY): Payer: Self-pay

## 2021-05-22 ENCOUNTER — Ambulatory Visit (INDEPENDENT_AMBULATORY_CARE_PROVIDER_SITE_OTHER): Payer: Self-pay | Admitting: Internal Medicine

## 2021-05-22 VITALS — BP 130/90 | HR 77 | Temp 98.4°F | Ht 68.0 in | Wt 223.4 lb

## 2021-05-22 DIAGNOSIS — Z794 Long term (current) use of insulin: Secondary | ICD-10-CM

## 2021-05-22 DIAGNOSIS — I10 Essential (primary) hypertension: Secondary | ICD-10-CM

## 2021-05-22 DIAGNOSIS — N1832 Chronic kidney disease, stage 3b: Secondary | ICD-10-CM

## 2021-05-22 DIAGNOSIS — I129 Hypertensive chronic kidney disease with stage 1 through stage 4 chronic kidney disease, or unspecified chronic kidney disease: Secondary | ICD-10-CM

## 2021-05-22 DIAGNOSIS — N1831 Chronic kidney disease, stage 3a: Secondary | ICD-10-CM

## 2021-05-22 DIAGNOSIS — E1122 Type 2 diabetes mellitus with diabetic chronic kidney disease: Secondary | ICD-10-CM

## 2021-05-22 LAB — POCT GLYCOSYLATED HEMOGLOBIN (HGB A1C): Hemoglobin A1C: 8.2 % — AB (ref 4.0–5.6)

## 2021-05-22 LAB — GLUCOSE, CAPILLARY: Glucose-Capillary: 239 mg/dL — ABNORMAL HIGH (ref 70–99)

## 2021-05-22 NOTE — Progress Notes (Signed)
Subjective:  CC: DM  HPI:  Ashley Long is a 61 y.o. female with a past medical history stated below and presents today for DM. Please see problem based assessment and plan for additional details.  Past Medical History:  Diagnosis Date   AKI (acute kidney injury) ([place]) 07/17/2017   Chronic kidney disease (CKD), stage III (moderate) (HCC)    DIABETES MELLITUS, TYPE II 06/02/2006   Last HBA1C 8.7. , check 3 times daily   Hydronephrosis    sees Dr Royce Macadamia at [facility] ,   [place] 06/02/2006   Qualifier: Diagnosis of  By: Patrice Paradise MD, Roderic Palau     HYPERTENSION 06/02/2006   Filed Vitals:   01/21/11 1134  BP: 162/98  Pulse: 80  Temp: 97.4 F (36.3 C)  TempSrc: Oral  Resp: 20  Height: _0  (1.727 m)  Weight: 261 lb (118.389 kg)      Injury    Stage 2 chronic kidney disease 12/02/2017   now stage 3    Wound of right leg    right abover ankle, patient report sshe sustained a week ago from today  (11-08-2018) after hitting her leg on an object , reports wound is scabbed over and her pcp checked it out at [facility]  some days ago and was not converned, patient denies redness or pain at sit eof wound     Current Outpatient Medications on File Prior to Visit  Medication Sig Dispense Refill   amLODipine (NORVASC) 10 MG tablet Take 1 tablet (10 mg total) by mouth at bedtime. 90 tablet 3   aspirin (ASPIRIN ADULT LOW STRENGTH) 81 MG EC tablet Take 1 tablet (81 mg total) by mouth daily. Swallow whole. 90 tablet 3   atorvastatin (LIPITOR) 40 MG tablet Take 1 tablet (40 mg total) by mouth daily. 90 tablet 3   Blood Glucose Monitoring Suppl (WAVESENSE PRESTO) W/DEVICE KIT 1 each by Does not apply route 2 (two) times daily. 1 each 0   carvedilol (COREG) 25 MG tablet Take 1 tablet (25 mg total) by mouth 2 (two) times daily. 180 tablet 3   chlorthalidone (HYGROTON) 25 MG tablet Take 1 tablet (25 mg total) by mouth daily 30 tablet 2   cloNIDine (CATAPRES) 0.3 MG tablet Take 1 tablet (0.3 mg  total) by mouth 2 (two) times daily. 180 tablet 3   glucose blood (WAVESENSE PRESTO) test strip Use to test blood sugar 3 times daily. diag code E11.9. Insulin dependent 100 each 11   HUMALOG KWIKPEN 100 UNIT/ML KwikPen INJECT 3 TO 8 UNITS UNDER THE SKIN 3 TIMES DAILY 15 mL 5   Insulin Glargine (BASAGLAR KWIKPEN) 100 UNIT/ML INJECT 14 UNITS UNDER THE SKIN TWICE DAILY 15 mL 5   Insulin Pen Needle (PEN NEEDLES) 31G X 5 MM MISC 4 Doses/Fill by Does not apply route daily. 100 each 11   Insulin Syringe-Needle U-100 31G X 15/64" 0.3 ML MISC 1 each by Does not apply route 2 (two) times daily with a meal. 100 each 11   INVOKANA 300 MG TABS tablet TAKE 1 Tablet BY MOUTH ONCE EVERY DAY BEFORE BREAKFAST 30 tablet 5   ketotifen (ALAWAY) 0.025 % ophthalmic solution Place 1 drop into both eyes daily as needed (allergies).     Lancets 30G MISC 1 each by Does not apply route 2 (two) times daily. 100 each 12   losartan (COZAAR) 50 MG tablet TAKE 1 Tablet BY MOUTH ONCE EVERY DAY 90 tablet 3   magnesium oxide (  MAG-OX) 400 MG tablet Take 400 mg by mouth 2 (two) times daily.     omeprazole (PRILOSEC) 40 MG capsule Take 1 capsule (40 mg total) by mouth daily. 90 capsule 3   sitaGLIPtin-metformin (JANUMET) 50-1000 MG tablet Take 1 tablet by mouth 2 (two) times daily with a meal. 180 tablet 3   sulfamethoxazole-trimethoprim (BACTRIM DS) 800-160 MG tablet Take 1 tablet by mouth 2 (two) times daily. 6 tablet 0   No current facility-administered medications on file prior to visit.    Family History  Problem Relation Age of Onset   Diabetes Mother    Other Neg Hx     Social History   Socioeconomic History   Marital status: Single    Spouse name: Not on file   Number of children: 1   Years of education: 12   Highest education level: High school graduate  Occupational History   Occupation: Surveyor, quantity: [employer]  Tobacco Use   Smoking status: Former    Types: Cigarettes    Quit date:  10/15/1982    Years since quitting: 38.6   Smokeless tobacco: Never  Vaping Use   Vaping Use: Never used  Substance and Sexual Activity   Alcohol use: No    Alcohol/week: 0.0 standard drinks   Drug use: No   Sexual activity: Not Currently  Other Topics Concern   Not on file  Social History Narrative   Financial assistance approved for 100% discount at [facility] and has [facility] card per [employer]   02/12/10         Social Determinants of Health   Financial Resource Strain: Not on file  Food Insecurity: No Food Insecurity   Worried About [place] in the Last Year: Never true   Ran Out of Food in the Last Year: Never true  Transportation Needs: No Transportation Needs   Lack of Transportation (Medical): No   Lack of Transportation (Non-Medical): No  Physical Activity: Not on file  Stress: Not on file  Social Connections: Not on file  Intimate Partner Violence: Not on file    Review of Systems: ROS negative except for what is noted on the assessment and plan.  Objective:   Vitals:   05/22/21 1542  BP: 130/90  Pulse: 77  Temp: 98.4 F (36.9 C)  TempSrc: Oral  SpO2: 99%  Weight: 223 lb 6.4 oz (101.3 kg)  Height: _0  (1.727 m)    Physical Exam: Gen: A&O x3 and in no apparent distress, well appearing and nourished. Neck: no masses or nodules, AROM intact. CV: RRR, no murmurs, S1/S2 presents  Resp: Clear to ascultation bilaterally  Abd: BS (+) x4, soft, non-tender abdomen, without hepatosplenomegaly or masses MSK: Grossly normal AROM and strength x4 extremities. Skin: good skin turgor, no rashes, unusual bruising, or prominent lesions.     Assessment & Plan:  See Encounters Tab for problem based charting.  Patient discussed with Dr.  Claudell Kyle, D.O. [facility]   PGY-3 Pager: [phone]   Phone: [phone] Date 05/23/2021   Time 12:14 PM

## 2021-05-22 NOTE — Patient Instructions (Signed)
Thank you, Ms.Ronaldo Miyamoto for allowing [place] to provide your care today. Today we discussed Diabetes and blood pressure.   ? ?Labs/Tests Ordered: ? ?Lab Orders    ?     Glucose, capillary    ?     POC Hbg A1C     ? ?Referrals Ordered:  ?Referral Orders  ?No referral(s) requested today  ?  ? ?Medication Changes:  ?There are no discontinued medications.  ? ?No orders of the defined types were placed in this encounter. ?  ? ?Health Maintenance Screening: ?Diabetes Health Maintenance Due  ?Topic Date Due  ? HEMOGLOBIN A1C  08/22/2021  ? LIPID PANEL  01/29/2022  ? OPHTHALMOLOGY EXAM  02/13/2022  ? FOOT EXAM  04/08/2022  ?  ? ?Instructions: KEEP UP THE GOOD WORK!  ? ?Follow up: 3 months  ? ?Remember: If you have any questions or concerns, call our clinic at [phone] or after hours call [phone] and ask for the internal medicine resident on call. ? ?Marianna Payment, D.O. ?[place] ? ?  ?

## 2021-05-23 ENCOUNTER — Encounter: Payer: Self-pay | Admitting: Internal Medicine

## 2021-05-23 NOTE — Assessment & Plan Note (Signed)
Patient has a follow up with [facility] in the next month. Will get BMP results form that visit.  ?

## 2021-05-23 NOTE — Assessment & Plan Note (Signed)
Patient presents for DM follow up. She is currently on Janumet 50-1000 mg BID, humolog 3-8 units TID, glargine 14 units BID, Invokana 300 mg. She is tolerating this regimen well. She denies significant hypoglycemia.  ? ?Hemoglobin A1C Latest Ref Rng & Units 05/22/2021 01/29/2021 10/15/2020 07/12/2020  ?HGBA1C 4.0 - 5.6 % 8.2(A) 7.9(A) 7.9(A) 7.6(A)  ?Some recent data might be hidden  ? ? ?Plan: ?- Cont current management. ?- Counseled on Diet and exercise  ?

## 2021-05-23 NOTE — Assessment & Plan Note (Signed)
Presents for further evaluation of her HTN. She is currently on Chlorthalidone 25 mg , carvedilol 25 mg BID, amlodipine 10 mg, losartan 50 mg, clonidine 0.3 mg BID.  ? ?BP Readings from Last 3 Encounters:  ?05/22/21 130/90  ?04/08/21 130/80  ?01/29/21 120/82  ? ? ?Plan: ?- Continue current management.  ?

## 2021-06-16 ENCOUNTER — Other Ambulatory Visit: Payer: Self-pay | Admitting: Internal Medicine

## 2021-06-16 DIAGNOSIS — I10 Essential (primary) hypertension: Secondary | ICD-10-CM

## 2021-06-17 ENCOUNTER — Other Ambulatory Visit (HOSPITAL_COMMUNITY): Payer: Self-pay

## 2021-06-17 MED ORDER — CHLORTHALIDONE 25 MG PO TABS
25.0000 mg | ORAL_TABLET | Freq: Every day | ORAL | 2 refills | Status: DC
  Filled 2021-06-17: qty 30, 30d supply, fill #0
  Filled 2021-07-21: qty 30, 30d supply, fill #1
  Filled 2021-08-23: qty 30, 30d supply, fill #2

## 2021-07-22 ENCOUNTER — Other Ambulatory Visit (HOSPITAL_COMMUNITY): Payer: Self-pay

## 2021-07-24 ENCOUNTER — Other Ambulatory Visit: Payer: Self-pay | Admitting: Gastroenterology

## 2021-08-26 ENCOUNTER — Other Ambulatory Visit (HOSPITAL_COMMUNITY): Payer: Self-pay

## 2021-08-26 ENCOUNTER — Ambulatory Visit (INDEPENDENT_AMBULATORY_CARE_PROVIDER_SITE_OTHER): Payer: Self-pay | Admitting: Student

## 2021-08-26 VITALS — BP 148/80 | HR 70 | Temp 98.2°F | Ht 68.0 in | Wt 227.9 lb

## 2021-08-26 DIAGNOSIS — I10 Essential (primary) hypertension: Secondary | ICD-10-CM

## 2021-08-26 DIAGNOSIS — N183 Chronic kidney disease, stage 3 unspecified: Secondary | ICD-10-CM

## 2021-08-26 DIAGNOSIS — E1122 Type 2 diabetes mellitus with diabetic chronic kidney disease: Secondary | ICD-10-CM

## 2021-08-26 DIAGNOSIS — I129 Hypertensive chronic kidney disease with stage 1 through stage 4 chronic kidney disease, or unspecified chronic kidney disease: Secondary | ICD-10-CM

## 2021-08-26 DIAGNOSIS — N1831 Chronic kidney disease, stage 3a: Secondary | ICD-10-CM

## 2021-08-26 DIAGNOSIS — N1832 Chronic kidney disease, stage 3b: Secondary | ICD-10-CM

## 2021-08-26 DIAGNOSIS — E119 Type 2 diabetes mellitus without complications: Secondary | ICD-10-CM

## 2021-08-26 DIAGNOSIS — Z794 Long term (current) use of insulin: Secondary | ICD-10-CM

## 2021-08-26 DIAGNOSIS — K5900 Constipation, unspecified: Secondary | ICD-10-CM

## 2021-08-26 DIAGNOSIS — Z87891 Personal history of nicotine dependence: Secondary | ICD-10-CM

## 2021-08-26 LAB — POCT GLYCOSYLATED HEMOGLOBIN (HGB A1C): Hemoglobin A1C: 8.1 % — AB (ref 4.0–5.6)

## 2021-08-26 LAB — GLUCOSE, CAPILLARY: Glucose-Capillary: 144 mg/dL — ABNORMAL HIGH (ref 70–99)

## 2021-08-26 MED ORDER — AMLODIPINE-OLMESARTAN 10-20 MG PO TABS: 1.0000 | ORAL_TABLET | Freq: Every day | ORAL | 1 refills | Status: DC

## 2021-08-26 MED ORDER — INSULIN LISPRO (1 UNIT DIAL) 100 UNIT/ML (KWIKPEN): PEN_INJECTOR | SUBCUTANEOUS | 5 refills | Status: DC

## 2021-08-26 MED ORDER — BASAGLAR KWIKPEN 100 UNIT/ML ~~LOC~~ SOPN: 30.0000 [IU] | PEN_INJECTOR | Freq: Every day | SUBCUTANEOUS | 5 refills | Status: DC

## 2021-08-26 MED ORDER — SENNOSIDES-DOCUSATE SODIUM 8.6-50 MG PO TABS: 1.0000 | ORAL_TABLET | Freq: Every evening | ORAL | 2 refills | Status: DC | PRN

## 2021-08-26 NOTE — Progress Notes (Unsigned)
   CC: Follow-up  HPI:  [name] is a 60 y.o. female with PMH as below who presents to clinic to follow-up on her chronic medical problems. Please see problem based charting for evaluation, assessment and plan.  Past Medical History:  Diagnosis Date   AKI (acute kidney injury) ([place]) 07/17/2017   Chronic kidney disease (CKD), stage III (moderate) (HCC)    DIABETES MELLITUS, TYPE II 06/02/2006   Last HBA1C 8.7. , check 3 times daily   Epidermal inclusion cyst 03/23/2020   Hematoma of right breast 04/10/2021   Hydronephrosis    sees Dr Royce Macadamia at [facility] ,   [place] 06/02/2006   Qualifier: Diagnosis of  By: Patrice Paradise MD, Roderic Palau     HYPERTENSION 06/02/2006   Filed Vitals:   01/21/11 1134  BP: 162/98  Pulse: 80  Temp: 97.4 F (36.3 C)  TempSrc: Oral  Resp: 20  Height: '5\' 8"'$  (1.727 m)  Weight: 261 lb (118.389 kg)      Injury    Stage 2 chronic kidney disease 12/02/2017   now stage 3    Wound of right leg    right abover ankle, patient report sshe sustained a week ago from today  (11-08-2018) after hitting her leg on an object , reports wound is scabbed over and her p[facility] it out [facility]  some days ago and was not converned, patient denies redness or pain at sit eof wound     Review of Systems:  Constitutional: Negative for fever, polydipsia or fatigue Eyes: Negative for visual changes Respiratory: Negative for shortness of breath GU: Negative for polyuria Abdomen: Positive for constipation. Negative for abdominal pain, nausea or vomiting. Neuro: Negative for headache or weakness  Physical Exam: General: Pleasant, well-appearing elderly woman.  No acute distress. Cardiac: RRR. No murmurs, rubs or gallops. No LE edema Respiratory: Lungs CTAB. No wheezing or crackles. Abdominal: Soft, symmetric and non tender. Normal BS. Skin: Warm, dry and intact without rashes or lesions Extremities: Atraumatic. Full ROM. Palpable radial and DP pulses. Neuro: A&O x 3. Moves  all extremities. Normal sensation to gross touch. Psych: Appropriate mood and affect.  Vitals:   08/26/21 1605 08/26/21 1648  BP: 140/90 (!) 148/80  Pulse: 70   Temp: 98.2 F (36.8 C)   TempSrc: Oral   Weight: 227 lb 14.4 oz (103.4 kg)   Height: '5\' 8"'$  (1.727 m)     Assessment & Plan:   See Encounters Tab for problem based charting.  Patient discussed with Dr. Lorenz Coaster, MD, MPH

## 2021-08-26 NOTE — Patient Instructions (Signed)
Thank you, Ashley Long for allowing [place] to provide your care today. Today we discussed your diabetes, CKD and blood pressure. Your A1c is still at 8.1%. We will make some changes to your insulin regimen to get it under 7%.    I have ordered the following medication/changed the following medications:  Switch amlodipine and losartan to amlodipine-olmesartan 10-20 mg daily Increase Lantus to 30 units at bedtime For your Humalog, INJECT 3 TO 8 UNITS UNDER THE SKIN 3 TIMES DAILY If 70-99, inject 2 units 100-140, inject 3 units 141-180, inject 4 units 181-220, inject 5 units 221-260, inject 6 units 261-300, inject 7 units 301-360, inject 8 units   My Chart Access: [email or web address]  Please follow-up in 3 months  Please make sure to arrive 15 minutes prior to your next appointment. If you arrive late, you may be asked to reschedule.    We look forward to seeing you next time. Please call our clinic at [phone] if you have any questions or concerns. The best time to call is Monday-Friday from 9am-4pm, but there is someone available 24/7. If after hours or the weekend, call the main hospital number and ask for the Internal Medicine Resident On-Call. If you need medication refills, please notify your pharmacy one week in advance and they will send [place] a request.   Thank you for letting us take part in your care. Wishing you the best!  Lacinda Axon, MD 08/26/2021, 5:04 PM IM Resident, PGY-2 [facility] 41:10

## 2021-08-28 ENCOUNTER — Encounter: Payer: Self-pay | Admitting: Student

## 2021-08-28 NOTE — Assessment & Plan Note (Addendum)
Patient continues to have intermittent constipation.  States she is moving her bowels with a laxative but becomes severely constipated if she does not use it.  She was recently evaluated by her GI provider who encouraged patient to continue MiraLAX daily as needed.  Patient hesitant to rely on MiraLAX daily for her bowel movement.  States she wants to try prune juice and other natural ways to move her bowel.  Advised patient to increase fiber in diet and also try Benefiber.  Plan: -Continue MiraLAX and start prn Senokot-S -Advised to try over-the-counter Benefiber -Follow-up with GI as needed

## 2021-08-28 NOTE — Assessment & Plan Note (Addendum)
Patient reports that the last few weeks, she has been a lot of stressed so she has not followed her diabetic diet as recommended.  A1c relatively unchanged from 8.2% 3 months ago to 8.1% today. Printout of meter shows an average blood glucose of 142, lowest of 51 and highest of 395. Patient above the target range 50% of the time, within target range 39% and below target 11%. Further analysis of patient's meter log shows majority of her hypoglycemic episodes are happening before lunch and dinner and her hypoglycemic episodes are happening after dinner. Patient advised to stick strictly to the sliding scale recommendation in order to decrease her risk of hypoglycemic episodes.  Plan: -Increase insulin glargine from 28 units to 30 units at bedtime. -Continue Humalog 3-8 units 3 times daily, adjusted scale and educated patient on sticking closely to this scale. -Continue Janumet 50-1000 mg twice daily -Continue Invokana 300 mg daily -Repeat A1c in 3 months, consider mealtime coverage if patient continues to have large swings in her glucose.

## 2021-08-28 NOTE — Assessment & Plan Note (Signed)
Follows closely with [facility].

## 2021-08-28 NOTE — Assessment & Plan Note (Signed)
BP slightly elevated in clinic today with SBP in the 140s however patient states her blood pressures usually well controlled at home. She checks it regularly at home and the SBP is usually in the 110s-130s. Patient has no acute complaints denies any side effects from current regimen. Patient currently on 5 different antihypertensive medications. We will consolidate her amlodipine and losartan into amlodipine-olmesartan to decrease pill burden.  Patient get medication assistance from [facility] in [place] who delivers all her medications free of charge.  Patient advised to take her losartan and amlodipine until she receives the new combo pill. Patient also advised to check her BP at home and call the office later this week with the readings.  Recent labs from [facility] shows stable kidney function.  Plan: -Replace amlodipine 10 and losartan 50 mg with amlodipine-olmesartan (Azor) 10-20 mg daily -Continue chlorthalidone 25 mg daily -Continue Coreg 25 mg twice daily -Continue clonidine 0.3 mg twice daily

## 2021-08-30 NOTE — Progress Notes (Signed)
Internal Medicine Clinic Attending  Case discussed with the resident at the time of the visit.  We reviewed the resident's history and exam and pertinent patient test results.  I agree with the assessment, diagnosis, and plan of care documented in the resident's note.  

## 2021-09-07 ENCOUNTER — Encounter: Payer: Self-pay | Admitting: *Deleted

## 2021-09-26 ENCOUNTER — Other Ambulatory Visit: Payer: Self-pay

## 2021-09-27 ENCOUNTER — Other Ambulatory Visit: Payer: Self-pay

## 2021-09-27 ENCOUNTER — Other Ambulatory Visit (HOSPITAL_COMMUNITY): Payer: Self-pay

## 2021-09-27 DIAGNOSIS — I10 Essential (primary) hypertension: Secondary | ICD-10-CM

## 2021-09-27 MED ORDER — CLONIDINE HCL 0.3 MG PO TABS
0.3000 mg | ORAL_TABLET | Freq: Two times a day (BID) | ORAL | 3 refills | Status: DC
  Filled 2021-09-27: qty 60, 30d supply, fill #0

## 2021-09-27 NOTE — Telephone Encounter (Signed)
Pt states the pharmacy is waiting for reply on cloNIDine (CATAPRES) 0.3 MG tablet. Pt is using [facility].

## 2021-10-04 ENCOUNTER — Other Ambulatory Visit: Payer: Self-pay | Admitting: Internal Medicine

## 2021-10-04 ENCOUNTER — Other Ambulatory Visit (HOSPITAL_COMMUNITY): Payer: Self-pay

## 2021-10-04 DIAGNOSIS — I10 Essential (primary) hypertension: Secondary | ICD-10-CM

## 2021-10-07 ENCOUNTER — Other Ambulatory Visit (HOSPITAL_COMMUNITY): Payer: Self-pay

## 2021-10-07 MED ORDER — CHLORTHALIDONE 25 MG PO TABS
25.0000 mg | ORAL_TABLET | Freq: Every day | ORAL | 2 refills | Status: DC
  Filled 2021-10-07: qty 30, 30d supply, fill #0
  Filled 2021-11-03: qty 30, 30d supply, fill #1
  Filled 2021-12-01: qty 30, 30d supply, fill #2

## 2021-10-10 ENCOUNTER — Other Ambulatory Visit: Payer: Self-pay | Admitting: Internal Medicine

## 2021-10-10 DIAGNOSIS — E119 Type 2 diabetes mellitus without complications: Secondary | ICD-10-CM

## 2021-10-29 ENCOUNTER — Other Ambulatory Visit: Payer: Self-pay

## 2021-10-29 ENCOUNTER — Other Ambulatory Visit (HOSPITAL_COMMUNITY): Payer: Self-pay

## 2021-10-29 ENCOUNTER — Ambulatory Visit (INDEPENDENT_AMBULATORY_CARE_PROVIDER_SITE_OTHER): Payer: Self-pay | Admitting: Internal Medicine

## 2021-10-29 ENCOUNTER — Encounter: Payer: Self-pay | Admitting: Internal Medicine

## 2021-10-29 VITALS — BP 138/88 | HR 82 | Temp 98.3°F | Resp 28 | Ht 68.0 in | Wt 232.3 lb

## 2021-10-29 DIAGNOSIS — E1122 Type 2 diabetes mellitus with diabetic chronic kidney disease: Secondary | ICD-10-CM

## 2021-10-29 DIAGNOSIS — I10 Essential (primary) hypertension: Secondary | ICD-10-CM

## 2021-10-29 DIAGNOSIS — I129 Hypertensive chronic kidney disease with stage 1 through stage 4 chronic kidney disease, or unspecified chronic kidney disease: Secondary | ICD-10-CM

## 2021-10-29 DIAGNOSIS — E785 Hyperlipidemia, unspecified: Secondary | ICD-10-CM

## 2021-10-29 DIAGNOSIS — N1832 Chronic kidney disease, stage 3b: Secondary | ICD-10-CM

## 2021-10-29 DIAGNOSIS — Z87891 Personal history of nicotine dependence: Secondary | ICD-10-CM

## 2021-10-29 DIAGNOSIS — Z794 Long term (current) use of insulin: Secondary | ICD-10-CM

## 2021-10-29 LAB — GLUCOSE, CAPILLARY: Glucose-Capillary: 126 mg/dL — ABNORMAL HIGH (ref 70–99)

## 2021-10-29 MED ORDER — AMLODIPINE-OLMESARTAN 5-20 MG PO TABS
1.0000 | ORAL_TABLET | Freq: Every day | ORAL | 0 refills | Status: DC
  Filled 2021-10-29: qty 30, 30d supply, fill #0
  Filled 2021-11-24: qty 30, 30d supply, fill #1

## 2021-10-29 NOTE — Patient Instructions (Addendum)
Ashley Long, it was a pleasure seeing you today! You endorsed feeling well today. Below are some of the things we talked about this visit. We look forward to seeing you in the follow up appointment!  Today we discussed: For your blood pressure: Looking at your log, your blood pressure appears well controlled. Continue current medications including the amlodipine-olmesartan combo, Coreg, Hygroton, and Clonidine. We checked your kidney function this visit and will call you with the result.   For your Cholesterol: We repeated your cholesterol this visit and will call you with the result. Continue your lipitor.   Please get shingles vaccine from the pharmacy.   I have ordered the following labs today:  Lab Orders         BMP8+Anion Gap         Lipid Profile         Glucose, capillary       Referrals ordered today:   Referral Orders  No referral(s) requested today     I have ordered the following medication/changed the following medications:   Stop the following medications: Medications Discontinued During This Encounter  Medication Reason   amlodipine-olmesartan (AZOR) 10-20 MG tablet Discontinued by provider     Start the following medications: Meds ordered this encounter  Medications   amLODipine-olmesartan (AZOR) 5-20 MG tablet    Sig: Take 1 tablet by mouth daily.    Dispense:  90 tablet    Refill:  0     Follow-up: 1 month follow up  Please make sure to arrive 15 minutes prior to your next appointment. If you arrive late, you may be asked to reschedule.   We look forward to seeing you next time. Please call our clinic at [phone] if you have any questions or concerns. The best time to call is Monday-Friday from 9am-4pm, but there is someone available 24/7. If after hours or the weekend, call the main hospital number and ask for the Internal Medicine Resident On-Call. If you need medication refills, please notify your pharmacy one week in advance and they will  send [place] a request.  Thank you for letting us take part in your care. Wishing you the best!  Thank you, [name], MD

## 2021-10-29 NOTE — Progress Notes (Signed)
CC: Follow up  HPI:  Ashley Long is a 61 y.o. with medical history HTN, DMII, HLD, CKDIII presenting to the [facility] for a follow up.   Please see problem-based list for further details, assessments, and plans.  Past Medical History:  Diagnosis Date   AKI (acute kidney injury) ([place]) 07/17/2017   Chronic kidney disease (CKD), stage III (moderate) (HCC)    DIABETES MELLITUS, TYPE II 06/02/2006   Last HBA1C 8.7. , check 3 times daily   Epidermal inclusion cyst 03/23/2020   Hematoma of right breast 04/10/2021   Hydronephrosis    sees Dr Royce Macadamia at [facility] ,   [place] 06/02/2006   Qualifier: Diagnosis of  By: Patrice Paradise MD, Roderic Palau     HYPERTENSION 06/02/2006   Filed Vitals:   01/21/11 1134  BP: 162/98  Pulse: 80  Temp: 97.4 F (36.3 C)  TempSrc: Oral  Resp: 20  Height: '5\' 8"'  (1.727 m)  Weight: 261 lb (118.389 kg)      Injury    Persistent proteinuria 01/21/2018   Stage 2 chronic kidney disease 12/02/2017   now stage 3    Suprapubic pain 05/17/2020   Wound of right leg    right abover ankle, patient report sshe sustained a week ago from today  (11-08-2018) after hitting her leg on an object , reports wound is scabbed over and her pcp [facility] it [facility]  some days ago and was not converned, patient denies redness or pain at sit eof wound     Current Outpatient Medications (Endocrine & Metabolic):    Insulin Glargine (BASAGLAR KWIKPEN) 100 UNIT/ML, Inject 30 Units into the skin at bedtime.   insulin lispro (HUMALOG KWIKPEN) 100 UNIT/ML KwikPen, INJECT 3 TO 8 UNITS UNDER THE SKIN 3 TIMES DAILY If 70-99, inject 2 units 100-140, inject 3 units 141-180, inject 4 units 181-220, inject 5 units 221-260, inject 6 units 261-300, inject 7 units 301-360, inject 8 units   INVOKANA 300 MG TABS tablet, TAKE 1 Tablet BY MOUTH ONCE EVERY DAY BEFORE BREAKFAST   sitaGLIPtin-metformin (JANUMET) 50-1000 MG tablet, Take 1 tablet by mouth 2 (two) times daily with a meal.  Current Outpatient  Medications (Cardiovascular):    amLODipine-olmesartan (AZOR) 5-20 MG tablet, Take 1 tablet by mouth daily.   atorvastatin (LIPITOR) 40 MG tablet, Take 1 tablet (40 mg total) by mouth daily.   carvedilol (COREG) 25 MG tablet, Take 1 tablet (25 mg total) by mouth 2 (two) times daily.   chlorthalidone (HYGROTON) 25 MG tablet, Take 1 tablet (25 mg total) by mouth daily   cloNIDine (CATAPRES) 0.3 MG tablet, Take 1 tablet (0.3 mg total) by mouth 2 (two) times daily.   Current Outpatient Medications (Analgesics):    aspirin (ASPIRIN ADULT LOW STRENGTH) 81 MG EC tablet, Take 1 tablet (81 mg total) by mouth daily. Swallow whole.   Current Outpatient Medications (Other):    Blood Glucose Monitoring Suppl (WAVESENSE PRESTO) W/DEVICE KIT, 1 each by Does not apply route 2 (two) times daily.   glucose blood (WAVESENSE PRESTO) test strip, Use to test blood sugar 3 times daily. diag code E11.9. Insulin dependent   Insulin Pen Needle (PEN NEEDLES) 31G X 5 MM MISC, 4 Doses/Fill by Does not apply route daily.   Insulin Syringe-Needle U-100 31G X 15/64" 0.3 ML MISC, 1 each by Does not apply route 2 (two) times daily with a meal.   ketotifen (ALAWAY) 0.025 % ophthalmic solution, Place 1 drop into both eyes daily  as needed (allergies).   Lancets 30G MISC, 1 each by Does not apply route 2 (two) times daily.   magnesium oxide (MAG-OX) 400 MG tablet, Take 400 mg by mouth 2 (two) times daily.   omeprazole (PRILOSEC) 40 MG capsule, Take 1 capsule (40 mg total) by mouth daily.   senna-docusate (SENOKOT S) 8.6-50 MG tablet, Take 1 tablet by mouth at bedtime as needed for mild constipation or moderate constipation.  Review of Systems:  Review of system negative unless stated in the problem list or HPI.    Physical Exam:  Vitals:   10/29/21 1635 10/29/21 1704  BP: (!) 152/86 138/88  Pulse: 82   Resp: (!) 28   Temp: 98.3 F (36.8 C)   TempSrc: Oral   SpO2: 100%   Weight: 232 lb 4.8 oz (105.4 kg)   Height: 5'  8" (1.727 m)     Physical Exam  General: NAD HENT: NCAT Lungs: CTAB, no wheeze, rhonchi or rales.  Cardiovascular: Normal heart sounds, no r/m/g, 2+ pulses in all extremities. No LE edema Abdomen: No TTP, normal bowel sounds, abdominal hernia present that is reducible and without TTP (chronic >1 year duration).  MSK: No asymmetry or muscle atrophy.  Skin: no lesions noted on exposed skin Neuro: Alert and oriented x4. 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 BP in clinic today 138/88, HR 82. Reported home readings with SBP 120s, and Dia 70s. Does monitor BP level at home. Home medications include Amlodipine-Olmesartan 10-20 mg qd, Coreg 25 mg BID, Hygroton 25 mg qd, and Clonidine 0.3 mg BID. Has not been able to pick up combo pill and only taking Amlodipine 10 mg and not Losartan. Otherwise reports good medication compliance. Tolerating medication without adverse effects. Denies headaches, vision changes, lightheadedness, chest pain, SHOB, leg swelling or changes in speech. Exam benign and vitals otherwise wnl. Last creatinine was 1.49 in 01/2021. Counseled on the importance of daily exercise, low salt diet, and weight loss. -Will resent combo pill but will decrease amlodipine to 5 mg as her blood pressure is doing well without Losartan. Will continue ARB for reno-protective effect.  BP log and bring to next visit  Continue lifestyle changes F/u on BMP done this visit  Addendum: BMP shows slight increase in Cr to 1.7 but overall stable. Pt has regular nephrology follow up with next appointment in 3 months.   Hyperlipidemia On lipitor 40 mg qd. Last lipid panel in 01/2021 showed chol of 114 and LDL of 40. Reports adherence to regimen. Plan to repeat lipid panel this visit. Lipid showed Chol 115 and LDL 50.  -Plan to continue Lipitor 40 mg and repeat lipid panel in 1 year.   Diabetes mellitus ([place]) Patient's DMII is  uncontrolled. A1c was 8.1 2 months ago. CBG 126. Home monitoring with fasting glucose ranging 80-140s with some 200s which were >4 weeks ago. Current regimen includes insulin glargine 30 units qd, insulin lispro based on sliding scale, Invokana 300 mg qd, and Janumet 50-1000 mg BID. Reports good medication adherence. Improving on current regimen as A1c decreased from 8.9 to 8.1 and her capillary readings are improved. . Tolerating medication without adverse effects. Counseled on the benefits of daily exercise, limiting processed foods and high sugar foods, and weight loss. No hypoglycemic episodes. Denies polydipsia, polyuria, weight loss, lethargy, blurry vision, and changes in sensation. No wounds or ulcer of bilateral feet. Benign physical exam and  vitals wnl.   -Continue current therapy. Repeat A1c next month.  -CTM CBG's and bring glucometer to next visit  -Continue lifestyle modifications   Patient discussed with Dr. Dollene Cleveland, MD

## 2021-10-30 ENCOUNTER — Other Ambulatory Visit (HOSPITAL_COMMUNITY): Payer: Self-pay

## 2021-10-30 ENCOUNTER — Telehealth: Payer: Self-pay

## 2021-10-30 LAB — BMP8+ANION GAP
Anion Gap: 18 mmol/L (ref 10.0–18.0)
BUN/Creatinine Ratio: 18 (ref 9–23)
BUN: 30 mg/dL — ABNORMAL HIGH (ref 6–24)
CO2: 22 mmol/L (ref 20–29)
Calcium: 10.2 mg/dL (ref 8.7–10.2)
Chloride: 99 mmol/L (ref 96–106)
Creatinine, Ser: 1.71 mg/dL — ABNORMAL HIGH (ref 0.57–1.00)
Glucose: 142 mg/dL — ABNORMAL HIGH (ref 70–99)
Potassium: 4.3 mmol/L (ref 3.5–5.2)
Sodium: 139 mmol/L (ref 134–144)
eGFR: 34 mL/min/{1.73_m2} — ABNORMAL LOW (ref >59)

## 2021-10-30 LAB — LIPID PANEL
Chol/HDL Ratio: 2.6 ratio (ref 0.0–4.4)
Cholesterol, Total: 115 mg/dL (ref 100–199)
HDL: 45 mg/dL (ref >39)
LDL Chol Calc (NIH): 50 mg/dL (ref 0–99)
Triglycerides: 108 mg/dL (ref 0–149)
VLDL Cholesterol Cal: 20 mg/dL (ref 5–40)

## 2021-10-30 MED ORDER — AMLODIPINE-OLMESARTAN 5-20 MG PO TABS: 1.0000 | ORAL_TABLET | Freq: Every day | ORAL | 0 refills | Status: DC

## 2021-10-30 NOTE — Assessment & Plan Note (Addendum)
BP near goal. BP in clinic today 138/88, HR 82. Reported home readings with SBP 120s, and Dia 70s. Does monitor BP level at home. Home medications include Amlodipine-Olmesartan 10-20 mg qd, Coreg 25 mg BID, [place] 25 mg qd, and Clonidine 0.3 mg BID. Has not been able to pick up combo pill and only taking Amlodipine 10 mg and not Losartan. Otherwise reports good medication compliance. Tolerating medication without adverse effects. Denies headaches, vision changes, lightheadedness, chest pain, SHOB, leg swelling or changes in speech. Exam benign and vitals otherwise wnl. Last creatinine was 1.49 in 01/2021. Counseled on the importance of daily exercise, low salt diet, and weight loss. -Will resent combo pill but will decrease amlodipine to 5 mg as her blood pressure is doing well without Losartan. Will continue ARB for reno-protective effect.  BP log and bring to next visit  Continue lifestyle changes F/u on BMP done this visit  Addendum: BMP shows slight increase in Cr to 1.7 but overall stable. Pt has regular nephrology follow up with next appointment in 3 months. Results called and communicated to the patient.

## 2021-10-30 NOTE — Assessment & Plan Note (Signed)
Patient's DMII is uncontrolled. A1c was 8.1 2 months ago. CBG 126. Home monitoring with fasting glucose ranging 80-140s with some 200s which were >4 weeks ago. Current regimen includes insulin glargine 30 units qd, insulin lispro based on sliding scale, Invokana 300 mg qd, and Janumet 50-1000 mg BID. Reports good medication adherence. Improving on current regimen as A1c decreased from 8.9 to 8.1 and her capillary readings are improved. . Tolerating medication without adverse effects. Counseled on the benefits of daily exercise, limiting processed foods and high sugar foods, and weight loss. No hypoglycemic episodes. Denies polydipsia, polyuria, weight loss, lethargy, blurry vision, and changes in sensation. No wounds or ulcer of bilateral feet. Benign physical exam and vitals wnl.   -Continue current therapy. Repeat A1c next month.  -CTM CBG's and bring glucometer to next visit  -Continue lifestyle modifications

## 2021-10-30 NOTE — Telephone Encounter (Signed)
Pt requesting a call back.  The patient states she could not get her medication refilled that was called in because the [facility] does not cover the medication.  The pt states [facility] should be able to help her.  amLODIPine-Olmesartan   Patient also calling back about her Lab results and make sure of the correct medications she should be taking.

## 2021-10-30 NOTE — Telephone Encounter (Signed)
Patient called she is requesting her lab results from yesterdays visit, patient also wants to go over her medications to see what she needs to be taking and what she does not need to be taking. Please return patients call

## 2021-10-30 NOTE — Assessment & Plan Note (Signed)
On lipitor 40 mg qd. Last lipid panel in 01/2021 showed chol of 114 and LDL of 40. Reports adherence to regimen. Plan to repeat lipid panel this visit. Lipid showed Chol 115 and LDL 50.  -Plan to continue Lipitor 40 mg and repeat lipid panel in 1 year.

## 2021-10-31 NOTE — Telephone Encounter (Signed)
Call from patient questions about when to stop taking her Amlodipine and Losartan medications.  Patient is take as usual today and start the new combination medication tomorrow per Dr. Humphrey Rolls and Cain Sieve.  Patient voiced understanding of the plan.  Also Dr. Humphrey Rolls to see patient can get her combination med through [facility]  where patient said that she will get for free.

## 2021-11-04 ENCOUNTER — Other Ambulatory Visit (HOSPITAL_COMMUNITY): Payer: Self-pay

## 2021-11-04 NOTE — Progress Notes (Signed)
Internal Medicine Clinic Attending  Case discussed with Dr. Khan  At the time of the visit.  We reviewed the resident's history and exam and pertinent patient test results.  I agree with the assessment, diagnosis, and plan of care documented in the resident's note.  

## 2021-11-04 NOTE — Addendum Note (Signed)
Addended by: Jodean Lima on: 11/04/2021 09:03 AM   Modules accepted: Level of Service

## 2021-11-11 ENCOUNTER — Other Ambulatory Visit (HOSPITAL_COMMUNITY): Payer: Self-pay

## 2021-11-11 ENCOUNTER — Encounter (HOSPITAL_COMMUNITY): Payer: Self-pay | Admitting: Gastroenterology

## 2021-11-19 ENCOUNTER — Ambulatory Visit (HOSPITAL_BASED_OUTPATIENT_CLINIC_OR_DEPARTMENT_OTHER): Payer: No Typology Code available for payment source | Admitting: Anesthesiology

## 2021-11-19 ENCOUNTER — Encounter (HOSPITAL_COMMUNITY)
Admission: RE | Disposition: A | Discharge status: Home / Self Care | Payer: Self-pay | Source: Home / Self Care | Attending: Gastroenterology

## 2021-11-19 ENCOUNTER — Encounter (HOSPITAL_COMMUNITY): Payer: Self-pay | Admitting: Gastroenterology

## 2021-11-19 ENCOUNTER — Other Ambulatory Visit: Payer: Self-pay

## 2021-11-19 ENCOUNTER — Ambulatory Visit (HOSPITAL_COMMUNITY): Payer: No Typology Code available for payment source | Admitting: Anesthesiology

## 2021-11-19 ENCOUNTER — Ambulatory Visit (HOSPITAL_COMMUNITY)
Admission: RE | Admit: 2021-11-19 | Discharge: 2021-11-19 | Disposition: A | Discharge status: Home / Self Care | Payer: No Typology Code available for payment source | Attending: Gastroenterology | Admitting: Gastroenterology

## 2021-11-19 DIAGNOSIS — K2900 Acute gastritis without bleeding: Secondary | ICD-10-CM

## 2021-11-19 DIAGNOSIS — K2950 Unspecified chronic gastritis without bleeding: Secondary | ICD-10-CM | POA: Insufficient documentation

## 2021-11-19 DIAGNOSIS — K2289 Other specified disease of esophagus: Secondary | ICD-10-CM | POA: Insufficient documentation

## 2021-11-19 DIAGNOSIS — Z7984 Long term (current) use of oral hypoglycemic drugs: Secondary | ICD-10-CM | POA: Insufficient documentation

## 2021-11-19 DIAGNOSIS — E119 Type 2 diabetes mellitus without complications: Secondary | ICD-10-CM | POA: Insufficient documentation

## 2021-11-19 DIAGNOSIS — I10 Essential (primary) hypertension: Secondary | ICD-10-CM | POA: Insufficient documentation

## 2021-11-19 DIAGNOSIS — R1310 Dysphagia, unspecified: Secondary | ICD-10-CM

## 2021-11-19 DIAGNOSIS — Z87891 Personal history of nicotine dependence: Secondary | ICD-10-CM

## 2021-11-19 DIAGNOSIS — K219 Gastro-esophageal reflux disease without esophagitis: Secondary | ICD-10-CM | POA: Diagnosis present

## 2021-11-19 DIAGNOSIS — Z794 Long term (current) use of insulin: Secondary | ICD-10-CM | POA: Insufficient documentation

## 2021-11-19 HISTORY — PX: BIOPSY: SHX5522

## 2021-11-19 HISTORY — PX: ESOPHAGOGASTRODUODENOSCOPY (EGD) WITH PROPOFOL: SHX5813

## 2021-11-19 LAB — GLUCOSE, CAPILLARY: Glucose-Capillary: 112 mg/dL — ABNORMAL HIGH (ref 70–99)

## 2021-11-19 SURGERY — ESOPHAGOGASTRODUODENOSCOPY (EGD) WITH PROPOFOL
Anesthesia: Monitor Anesthesia Care

## 2021-11-19 MED ORDER — LACTATED RINGERS IV SOLN: INTRAVENOUS | Status: DC

## 2021-11-19 MED ORDER — SODIUM CHLORIDE 0.9 % IV SOLN: INTRAVENOUS | Status: DC

## 2021-11-19 MED ORDER — PROPOFOL 500 MG/50ML IV EMUL
INTRAVENOUS | Status: DC | PRN
  Administered 2021-11-19: 100 ug/kg/min via INTRAVENOUS

## 2021-11-19 MED ORDER — LACTATED RINGERS IV SOLN: INTRAVENOUS | Status: DC | PRN

## 2021-11-19 MED ORDER — LIDOCAINE 2% (20 MG/ML) 5 ML SYRINGE
INTRAMUSCULAR | Status: DC | PRN
  Administered 2021-11-19 (×2): 40 mg via INTRAVENOUS

## 2021-11-19 MED ORDER — PROPOFOL 10 MG/ML IV BOLUS
INTRAVENOUS | Status: DC | PRN
  Administered 2021-11-19 (×2): 40 mg via INTRAVENOUS

## 2021-11-19 SURGICAL SUPPLY — 15 items

## 2021-11-19 NOTE — Transfer of Care (Signed)
Immediate Anesthesia Transfer of Care Note  Patient: Ashley Long  Procedure(s) Performed: ESOPHAGOGASTRODUODENOSCOPY (EGD) WITH PROPOFOL BIOPSY  Patient Location: PACU and Endoscopy Unit  Anesthesia Type:MAC  Level of Consciousness: awake  Airway & Oxygen Therapy: Patient Spontanous Breathing and Patient connected to face mask oxygen  Post-op Assessment: Report given to RN and Post -op Vital signs reviewed and stable  Post vital signs: Reviewed and stable  Last Vitals:  Vitals Value Taken Time  BP 152/83 11/19/21 0858  Temp    Pulse 82 11/19/21 0859  Resp 18 11/19/21 0859  SpO2 100 % 11/19/21 0859  Vitals shown include unvalidated device data.  Last Pain:  Vitals:   11/19/21 0723  TempSrc: Temporal  PainSc: 0-No pain         Complications: No notable events documented.

## 2021-11-19 NOTE — Anesthesia Preprocedure Evaluation (Signed)
Anesthesia Evaluation  Patient identified by MRN, date of birth, ID band Patient awake    Reviewed: Allergy & Precautions, NPO status , Patient's Chart, lab work & pertinent test results  Airway Mallampati: II  TM Distance: >3 FB Neck ROM: Full    Dental no notable dental hx.    Pulmonary neg pulmonary ROS, former smoker,    Pulmonary exam normal breath sounds clear to auscultation       Cardiovascular hypertension, Normal cardiovascular exam Rhythm:Regular Rate:Normal     Neuro/Psych negative neurological ROS  negative psych ROS   GI/Hepatic negative GI ROS, Neg liver ROS,   Endo/Other  diabetes, Insulin Dependent  Renal/GU Renal InsufficiencyRenal disease  negative genitourinary   Musculoskeletal negative musculoskeletal ROS (+)   Abdominal   Peds negative pediatric ROS (+)  Hematology negative hematology ROS (+)   Anesthesia Other Findings   Reproductive/Obstetrics negative OB ROS                             Anesthesia Physical Anesthesia Plan  ASA: 3  Anesthesia Plan: MAC   Post-op Pain Management: Minimal or no pain anticipated   Induction: Intravenous  PONV Risk Score and Plan: 2 and Propofol infusion and Treatment may vary due to age or medical condition  Airway Management Planned: Simple Face Mask  Additional Equipment:   Intra-op Plan:   Post-operative Plan:   Informed Consent: I have reviewed the patients History and Physical, chart, labs and discussed the procedure including the risks, benefits and alternatives for the proposed anesthesia with the patient or authorized representative who has indicated his/her understanding and acceptance.     Dental advisory given  Plan Discussed with: CRNA and Surgeon  Anesthesia Plan Comments:         Anesthesia Quick Evaluation

## 2021-11-19 NOTE — Discharge Instructions (Signed)

## 2021-11-19 NOTE — Anesthesia Procedure Notes (Signed)
Date/Time: 11/19/2021 8:34 AM  Performed by: Cynda Familia, CRNAPre-anesthesia Checklist: Patient identified, Emergency Drugs available, Suction available, Patient being monitored and Timeout performed Patient Re-evaluated:Patient Re-evaluated prior to induction Oxygen Delivery Method: Simple face mask Placement Confirmation: breath sounds checked- equal and bilateral and positive ETCO2 Dental Injury: Teeth and Oropharynx as per pre-operative assessment

## 2021-11-19 NOTE — Interval H&P Note (Signed)
History and Physical Interval Note:  11/19/2021 8:32 AM  Ashley Long  has presented today for surgery, with the diagnosis of Dysphagia/GERD.  The various methods of treatment have been discussed with the patient and family. After consideration of risks, benefits and other options for treatment, the patient has consented to  Procedure(s) with comments: ESOPHAGOGASTRODUODENOSCOPY (EGD) WITH PROPOFOL (N/A) SAVORY DILATION (N/A) - savory vs balloon as a surgical intervention.  The patient's history has been reviewed, patient examined, no change in status, stable for surgery.  I have reviewed the patient's chart and labs.  Questions were answered to the patient's satisfaction.     Lear Ng

## 2021-11-19 NOTE — Op Note (Signed)
[facility] Patient Name: Ashley Long Procedure Date: 11/19/2021 MRN: [id number] Attending MD: Lear Ng , MD Date of Birth: 1961-02-06 CSN: [id number] Age: 61 Admit Type: Outpatient Procedure:                Upper GI endoscopy Indications:              Dysphagia, Esophageal reflux Providers:                Lear Ng, MD, Ladoris Gene, RN, Theodora Blow, Technician Referring MD:              Medicines:                Propofol per Anesthesia, Monitored Anesthesia Care Complications:            No immediate complications. Estimated Blood Loss:     Estimated blood loss was minimal. Procedure:                Pre-Anesthesia Assessment:                           - Prior to the procedure, a History and Physical                            was performed, and patient medications and                            allergies were reviewed. The patient's tolerance of                            previous anesthesia was also reviewed. The risks                            and benefits of the procedure and the sedation                            options and risks were discussed with the patient.                            All questions were answered, and informed consent                            was obtained. Prior Anticoagulants: The patient has                            taken no previous anticoagulant or antiplatelet                            agents. ASA Grade Assessment: III - A patient with                            severe systemic disease. After reviewing the risks  and benefits, the patient was deemed in                            satisfactory condition to undergo the procedure.                           After obtaining informed consent, the endoscope was                            passed under direct vision. Throughout the                            procedure, the patient's blood pressure, pulse, and                             oxygen saturations were monitored continuously. The                            [id number]-H190 ([id number]) Olympus endoscope was introduced                            through the mouth, and advanced to the second part                            of duodenum. The upper GI endoscopy was                            accomplished without difficulty. The patient                            tolerated the procedure well. Scope In: Scope Out: Findings:      Mucosal changes including congestion (edema) were found in the distal       esophagus. Biopsies were taken with a cold forceps for histology.       Estimated blood loss was minimal.      The Z-line was regular and was found 38 cm from the incisors.      There is no endoscopic evidence of stricture in the entire esophagus.      Segmental mild inflammation characterized by congestion (edema) and       erythema was found in the gastric antrum. Biopsies were taken with a       cold forceps for histology. Estimated blood loss was minimal.      The cardia and gastric fundus were normal on retroflexion.      The examined duodenum was normal.      The cardia and gastric fundus were normal on retroflexion. Impression:               - Esophageal mucosal changes suspicious for                            eosinophilic esophagitis. Biopsied.                           - Z-line regular, 38 cm from the incisors.                           -  Acute gastritis. Biopsied.                           - Normal examined duodenum. Moderate Sedation:      Not Applicable - Patient had care per Anesthesia. Recommendation:           - Patient has a contact number available for                            emergencies. The signs and symptoms of potential                            delayed complications were discussed with the                            patient. Return to normal activities tomorrow.                            Written discharge instructions were  provided to the                            patient.                           - Follow an antireflux regimen.                           - Await pathology results. Procedure Code(s):        --- Professional ---                           [phone], Esophagogastroduodenoscopy, flexible,                            transoral; with biopsy, single or multiple Diagnosis Code(s):        --- Professional ---                           R13.10, Dysphagia, unspecified                           K21.9, Gastro-esophageal reflux disease without                            esophagitis                           K29.00, Acute gastritis without bleeding                           K22.8, Other specified diseases of esophagus CPT copyright 2019 American Medical Association. All rights reserved. The codes documented in this report are preliminary and upon coder review may  be revised to meet current compliance requirements. Lear Ng, MD 11/19/2021 8:57:08 AM This report has been signed electronically. Number of Addenda: 0

## 2021-11-19 NOTE — H&P (Signed)
Date of Initial H&P: 11/05/21  History reviewed, patient examined, no change in status, stable for surgery.

## 2021-11-19 NOTE — Anesthesia Postprocedure Evaluation (Signed)
Anesthesia Post Note  Patient: Ashley Long  Procedure(s) Performed: ESOPHAGOGASTRODUODENOSCOPY (EGD) WITH PROPOFOL BIOPSY     Patient location during evaluation: PACU Anesthesia Type: MAC Level of consciousness: awake and alert Pain management: pain level controlled Vital Signs Assessment: post-procedure vital signs reviewed and stable Respiratory status: spontaneous breathing, nonlabored ventilation, respiratory function stable and patient connected to nasal cannula oxygen Cardiovascular status: stable and blood pressure returned to baseline Postop Assessment: no apparent nausea or vomiting Anesthetic complications: no   No notable events documented.  Last Vitals:  Vitals:   11/19/21 0912 11/19/21 0913  BP: (!) 155/90 (!) 158/87  Pulse: 70 70  Resp: 15 15  Temp:    SpO2: 97% 99%    Last Pain:  Vitals:   11/19/21 0723  TempSrc: Temporal  PainSc: 0-No pain                 [name] S

## 2021-11-21 ENCOUNTER — Encounter (HOSPITAL_COMMUNITY): Payer: Self-pay | Admitting: Gastroenterology

## 2021-11-21 LAB — SURGICAL PATHOLOGY

## 2021-11-25 ENCOUNTER — Other Ambulatory Visit (HOSPITAL_COMMUNITY): Payer: Self-pay

## 2021-11-28 ENCOUNTER — Ambulatory Visit (INDEPENDENT_AMBULATORY_CARE_PROVIDER_SITE_OTHER): Payer: Self-pay | Admitting: Internal Medicine

## 2021-11-28 ENCOUNTER — Other Ambulatory Visit (HOSPITAL_COMMUNITY): Payer: Self-pay

## 2021-11-28 VITALS — BP 142/86 | HR 71 | Temp 98.3°F | Ht 68.0 in | Wt 228.9 lb

## 2021-11-28 DIAGNOSIS — N1832 Chronic kidney disease, stage 3b: Secondary | ICD-10-CM

## 2021-11-28 DIAGNOSIS — Z87891 Personal history of nicotine dependence: Secondary | ICD-10-CM

## 2021-11-28 DIAGNOSIS — I129 Hypertensive chronic kidney disease with stage 1 through stage 4 chronic kidney disease, or unspecified chronic kidney disease: Secondary | ICD-10-CM

## 2021-11-28 DIAGNOSIS — Z7984 Long term (current) use of oral hypoglycemic drugs: Secondary | ICD-10-CM

## 2021-11-28 DIAGNOSIS — E1122 Type 2 diabetes mellitus with diabetic chronic kidney disease: Secondary | ICD-10-CM

## 2021-11-28 DIAGNOSIS — Z794 Long term (current) use of insulin: Secondary | ICD-10-CM

## 2021-11-28 DIAGNOSIS — I10 Essential (primary) hypertension: Secondary | ICD-10-CM

## 2021-11-28 DIAGNOSIS — E119 Type 2 diabetes mellitus without complications: Secondary | ICD-10-CM

## 2021-11-28 DIAGNOSIS — Z23 Encounter for immunization: Secondary | ICD-10-CM

## 2021-11-28 LAB — POCT GLYCOSYLATED HEMOGLOBIN (HGB A1C): Hemoglobin A1C: 8 % — AB (ref 4.0–5.6)

## 2021-11-28 LAB — GLUCOSE, CAPILLARY: Glucose-Capillary: 192 mg/dL — ABNORMAL HIGH (ref 70–99)

## 2021-11-28 MED ORDER — SEMAGLUTIDE(0.25 OR 0.5MG/DOS) 2 MG/3ML ~~LOC~~ SOPN
0.2500 mg | PEN_INJECTOR | SUBCUTANEOUS | 0 refills | Status: DC
  Filled 2021-11-28: qty 3, 30d supply, fill #0

## 2021-11-28 MED ORDER — METFORMIN HCL ER 500 MG PO TB24
500.0000 mg | ORAL_TABLET | Freq: Two times a day (BID) | ORAL | 2 refills | Status: DC
  Filled 2021-11-28: qty 60, 30d supply, fill #0

## 2021-11-28 MED ORDER — AMLODIPINE-OLMESARTAN 10-40 MG PO TABS
1.0000 | ORAL_TABLET | Freq: Every day | ORAL | 0 refills | Status: DC
  Filled 2021-11-28: qty 30, 30d supply, fill #0

## 2021-11-28 NOTE — Progress Notes (Unsigned)
CC: 1 month HTN follow up  HPI:  [name] is a 61 y.o. with medical history of HTN, DMII, HLD, CKDIII presenting to the [facility] for a follow up.  presenting to [facility] for HTN follow up.   Please see problem-based list for further details, assessments, and plans.  Past Medical History:  Diagnosis Date   AKI (acute kidney injury) ([place]) 07/17/2017   Chronic kidney disease (CKD), stage III (moderate) ([facility])    DIABETES MELLITUS, TYPE II 06/02/2006   Last HBA1C 8.7. , check 3 times daily   Epidermal inclusion cyst 03/23/2020   Hematoma of right breast 04/10/2021   Hydronephrosis    sees Dr Royce Macadamia at [facility] [facility] ,   [place] 06/02/2006   Qualifier: Diagnosis of  By: Patrice Paradise MD, Roderic Palau     HYPERTENSION 06/02/2006   Filed Vitals:   01/21/11 1134  BP: 162/98  Pulse: 80  Temp: 97.4 F (36.3 C)  TempSrc: Oral  Resp: 20  Height: _0  (1.727 m)  Weight: 261 lb (118.389 kg)      Injury    Persistent proteinuria 01/21/2018   Stage 2 chronic kidney disease 12/02/2017   now stage 3    Suprapubic pain 05/17/2020   Wound of right leg    right abover ankle, patient report sshe sustained a week ago from today  (11-08-2018) after hitting her leg on an object , reports wound is scabbed over and her pcp checked it out at [facility]  some days ago and was not converned, patient denies redness or pain at sit eof wound     Current Outpatient Medications (Endocrine & Metabolic):    Insulin Glargine (BASAGLAR KWIKPEN) 100 UNIT/ML, Inject 30 Units into the skin at bedtime.   insulin lispro (HUMALOG KWIKPEN) 100 UNIT/ML KwikPen, INJECT 3 TO 8 UNITS UNDER THE SKIN 3 TIMES DAILY If 70-99, inject 2 units 100-140, inject 3 units 141-180, inject 4 units 181-220, inject 5 units 221-260, inject 6 units 261-300, inject 7 units 301-360, inject 8 units   INVOKANA 300 MG TABS tablet, TAKE 1 Tablet BY MOUTH ONCE EVERY DAY BEFORE BREAKFAST   sitaGLIPtin-metformin (JANUMET) 50-1000 MG tablet, Take 1 tablet by mouth 2 (two)  times daily with a meal.  Current Outpatient Medications (Cardiovascular):    amLODipine-olmesartan (AZOR) 5-20 MG tablet, Take 1 tablet by mouth daily.   atorvastatin (LIPITOR) 40 MG tablet, Take 1 tablet (40 mg total) by mouth daily.   carvedilol (COREG) 25 MG tablet, Take 1 tablet (25 mg total) by mouth 2 (two) times daily.   chlorthalidone (HYGROTON) 25 MG tablet, Take 1 tablet (25 mg total) by mouth daily   cloNIDine (CATAPRES) 0.3 MG tablet, Take 1 tablet (0.3 mg total) by mouth 2 (two) times daily.   Current Outpatient Medications (Analgesics):    acetaminophen (TYLENOL) 500 MG tablet, Take 500 mg by mouth every 6 (six) hours as needed for moderate pain.   aspirin (ASPIRIN ADULT LOW STRENGTH) 81 MG EC tablet, Take 1 tablet (81 mg total) by mouth daily. Swallow whole.   Current Outpatient Medications (Other):    ascorbic acid (VITAMIN C) 500 MG tablet, Take 500 mg by mouth daily.   Blood Glucose Monitoring Suppl (WAVESENSE PRESTO) W/DEVICE KIT, 1 each by Does not apply route 2 (two) times daily.   cholecalciferol (VITAMIN D3) 25 MCG (1000 UNIT) tablet, Take 1,000 Units by mouth 3 (three) times a week.   glucose blood (WAVESENSE PRESTO) test strip, Use to test blood sugar  3 times daily. diag code E11.9. Insulin dependent   Insulin Pen Needle (PEN NEEDLES) 31G X 5 MM MISC, 4 Doses/Fill by Does not apply route daily.   Insulin Syringe-Needle U-100 31G X 15/64" 0.3 ML MISC, 1 each by Does not apply route 2 (two) times daily with a meal.   Lancets 30G MISC, 1 each by Does not apply route 2 (two) times daily.   magnesium oxide (MAG-OX) 400 MG tablet, Take 400 mg by mouth daily.   omeprazole (PRILOSEC) 40 MG capsule, Take 1 capsule (40 mg total) by mouth daily.   Polyethyl Glycol-Propyl Glycol (SYSTANE OP), Place 1 drop into both eyes daily as needed (irritation).   polyethylene glycol (MIRALAX / GLYCOLAX) 17 g packet, Take 17 g by mouth daily as needed for moderate constipation.    senna-docusate (SENOKOT S) 8.6-50 MG tablet, Take 1 tablet by mouth at bedtime as needed for mild constipation or moderate constipation. (Patient not taking: Reported on 11/11/2021)   valACYclovir (VALTREX) 500 MG tablet, Take 500 mg by mouth 2 (two) times daily as needed (fever blisters).  Review of Systems:  Review of system negative unless stated in the problem list or HPI.    Physical Exam:  There were no vitals filed for this visit.  Physical Exam General: NAD HENT: NCAT Lungs: CTAB, no wheeze, rhonchi or rales.  Cardiovascular: Normal heart sounds, no r/m/g, 2+ pulses in all extremities. No LE edema Abdomen: No TTP, normal bowel sounds MSK: No asymmetry or muscle atrophy.  Skin: no lesions noted on exposed skin Neuro: Alert and oriented x4. CN grossly intact Psych: Normal mood and normal affect   Assessment & Plan:   No problem-specific Assessment & Plan notes found for this encounter.   See Encounters Tab for problem based charting.  Patient discussed with Dr. {NAMES:[id number]::"Guilloud","Hoffman","Mullen","Narendra","Vincent","Machen","Lau","Hatcher"} Ashley Schuller, MD Tillie Rung. [facility] Internal Medicine Residency, PGY-2   A1c 8.0 Current regimen includes insulin glargine 30 units qd, insulin lispro based on sliding scale, Invokana 300 mg qd, and Janumet 50-1000 mg BID  HTN/CKDIIIb On Amlodipine 5-Olmesartan 20 mg qd, Chlorthalidone 25 mg qd, Clonidine 0.3 mg BID.  1.7 Cr.

## 2021-11-28 NOTE — Patient Instructions (Addendum)
Ms.Elektra D Jerline Pain, it was a pleasure seeing you today! You endorsed feeling well today. Below are some of the things we talked about this visit. We look forward to seeing you in the follow up appointment!  Today we discussed: We are changing your blood pressure medication. Please pick up the new prescription.  We are also changing your diabetes medications. Stop taking janumet and start taking metformin and ozempic.  We will refer you to have a sleep study done.   I have ordered the following labs today:  Lab Orders         Glucose, capillary         BMP8+Anion Gap         POC Hbg A1C       Referrals ordered today:   Referral Orders         Ambulatory referral to Sleep Studies       I have ordered the following medication/changed the following medications:   Stop the following medications: Medications Discontinued During This Encounter  Medication Reason   sitaGLIPtin-metformin (JANUMET) 50-1000 MG tablet Discontinued by provider   amLODipine-olmesartan (AZOR) 5-20 MG tablet Discontinued by provider     Start the following medications: Meds ordered this encounter  Medications   amLODipine-olmesartan (AZOR) 10-40 MG tablet    Sig: Take 1 tablet by mouth daily.    Dispense:  90 tablet    Refill:  0   metFORMIN (GLUCOPHAGE-XR) 500 MG 24 hr tablet    Sig: Take 1 tablet (500 mg total) by mouth 2 (two) times daily with a meal.    Dispense:  60 tablet    Refill:  2   Semaglutide,0.25 or 0.'5MG'$ /DOS, 2 MG/3ML SOPN    Sig: Inject 0.25 mg into the skin once a week.    Dispense:  3 mL    Refill:  0     Follow-up: 1 month follow up   Please make sure to arrive 15 minutes prior to your next appointment. If you arrive late, you may be asked to reschedule.   We look forward to seeing you next time. Please call our clinic at [phone] if you have any questions or concerns. The best time to call is Monday-Friday from 9am-4pm, but there is someone available 24/7. If after hours or  the weekend, call the main hospital number and ask for the Internal Medicine Resident On-Call. If you need medication refills, please notify your pharmacy one week in advance and they will send [place] a request.  Thank you for letting us take part in your care. Wishing you the best!  Thank you, [name], MD

## 2021-11-29 ENCOUNTER — Other Ambulatory Visit (HOSPITAL_COMMUNITY): Payer: Self-pay

## 2021-11-30 LAB — BMP8+ANION GAP
Anion Gap: 16 mmol/L (ref 10.0–18.0)
BUN/Creatinine Ratio: 23 (ref 9–23)
BUN: 35 mg/dL — ABNORMAL HIGH (ref 6–24)
CO2: 22 mmol/L (ref 20–29)
Calcium: 10.2 mg/dL (ref 8.7–10.2)
Chloride: 98 mmol/L (ref 96–106)
Creatinine, Ser: 1.51 mg/dL — ABNORMAL HIGH (ref 0.57–1.00)
Glucose: 203 mg/dL — ABNORMAL HIGH (ref 70–99)
Potassium: 3.9 mmol/L (ref 3.5–5.2)
Sodium: 136 mmol/L (ref 134–144)
eGFR: 40 mL/min/{1.73_m2} — ABNORMAL LOW (ref >59)

## 2021-12-01 NOTE — Assessment & Plan Note (Addendum)
Patient has HTN and is on Azor 5-20 mg qd, Chlorthalidone 25 mg qd, Coreg 25 mg BID, and Clonidine 0.3 mg BID. Her blood pressure is elevated in the clinic at 142/86. Home BP readings are 120s-130s/70s-80s. She has higher readings in 140s/80s as well. Will increase her Azor to 10-40 mg qd. Continue other antihypertensives as before. BMP this visit shows stable renal fxn. 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  -1 month follow up for HTN and repeat BMP.

## 2021-12-01 NOTE — Assessment & Plan Note (Signed)
Patient has DMII and last A1c was 8.1. A1c this visit was 8.0. Current regimen includes insulin glargine 30 units qd, insulin lispro based on sliding scale, Invokana 300 mg qd, and Janumet 50-1000 mg BID. Will discontinue Janumet and start Metformin 1000 BID and Ozempic weekly. Weight loss from ozempic will help with BP and DMII.  -Monitor at 1 month follow up visit.

## 2021-12-02 ENCOUNTER — Other Ambulatory Visit (HOSPITAL_COMMUNITY): Payer: Self-pay

## 2021-12-02 MED ORDER — JANUMET 50-1000 MG PO TABS: 1.0000 | ORAL_TABLET | Freq: Two times a day (BID) | ORAL | 5 refills | Status: DC

## 2021-12-02 NOTE — Addendum Note (Signed)
Addended by: [name] on: 12/02/2021 03:27 PM   Modules accepted: Orders

## 2021-12-03 MED ORDER — AMLODIPINE-OLMESARTAN 5-20 MG PO TABS: 1.0000 | ORAL_TABLET | Freq: Every day | ORAL | 0 refills | Status: DC

## 2021-12-03 NOTE — Addendum Note (Signed)
Addended by: [name] on: 12/03/2021 04:37 PM   Modules accepted: Orders

## 2021-12-04 NOTE — Progress Notes (Signed)
Internal Medicine Clinic Attending  Case discussed with Dr. Khan  At the time of the visit.  We reviewed the resident's history and exam and pertinent patient test results.  I agree with the assessment, diagnosis, and plan of care documented in the resident's note.  

## 2021-12-04 NOTE — Addendum Note (Signed)
Addended by: Aldine Contes on: 12/04/2021 10:00 AM   Modules accepted: Level of Service

## 2021-12-09 ENCOUNTER — Other Ambulatory Visit: Payer: Self-pay | Admitting: Student

## 2021-12-23 ENCOUNTER — Other Ambulatory Visit: Payer: Self-pay

## 2021-12-23 ENCOUNTER — Other Ambulatory Visit: Payer: Self-pay | Admitting: Internal Medicine

## 2021-12-23 ENCOUNTER — Other Ambulatory Visit: Payer: Self-pay | Admitting: Student

## 2021-12-23 ENCOUNTER — Other Ambulatory Visit (HOSPITAL_COMMUNITY): Payer: Self-pay

## 2021-12-23 DIAGNOSIS — I10 Essential (primary) hypertension: Secondary | ICD-10-CM

## 2021-12-23 MED ORDER — AMLODIPINE-OLMESARTAN 5-20 MG PO TABS
2.0000 | ORAL_TABLET | Freq: Every day | ORAL | 0 refills | Status: DC
  Filled 2021-12-23: qty 180, 90d supply, fill #0

## 2021-12-23 NOTE — Telephone Encounter (Signed)
Needs new prescription sent to pharmacy.  Did not receive recent prescription.

## 2021-12-24 ENCOUNTER — Other Ambulatory Visit (HOSPITAL_COMMUNITY): Payer: Self-pay

## 2021-12-25 ENCOUNTER — Telehealth: Payer: Self-pay

## 2021-12-25 ENCOUNTER — Other Ambulatory Visit (HOSPITAL_COMMUNITY): Payer: Self-pay

## 2021-12-25 MED ORDER — CHLORTHALIDONE 25 MG PO TABS
25.0000 mg | ORAL_TABLET | Freq: Every day | ORAL | 2 refills | Status: DC
  Filled 2021-12-25: qty 30, 30d supply, fill #0
  Filled 2022-01-20: qty 30, 30d supply, fill #1
  Filled 2022-02-24: qty 30, 30d supply, fill #2

## 2021-12-25 NOTE — Telephone Encounter (Signed)
Pt is requesting a call back .Marland Kitchen She stated that Dr Humphrey Rolls was checking on her med

## 2021-12-25 NOTE — Telephone Encounter (Signed)
Pt called and stated she picked up Azor rx which stated take 2 tabs daily. She stated her BPs have been ok at home and she prefers not to take 2 tablets b/c she does not want her BP to bottom out so she will only be taking 1 tablet "until further notice". Her BP's at home - today117/76, 134/83, 112/80, 106/66 ; yesterday 90/59, 99/61,116/81. She checks her BP's at least 3 times a day. Also stated she gets her meds from [place] [facility] for free; she had to pay $4 at [facility]; she prefers using [facility]. Thanks

## 2021-12-25 NOTE — Telephone Encounter (Signed)
Return pt's call - no answer; left message on self-identified vm the office had call and to call the office.

## 2021-12-26 ENCOUNTER — Other Ambulatory Visit: Payer: Self-pay

## 2021-12-26 DIAGNOSIS — Z1231 Encounter for screening mammogram for malignant neoplasm of breast: Secondary | ICD-10-CM

## 2021-12-30 ENCOUNTER — Other Ambulatory Visit: Payer: Self-pay | Admitting: Internal Medicine

## 2021-12-30 DIAGNOSIS — E119 Type 2 diabetes mellitus without complications: Secondary | ICD-10-CM

## 2021-12-31 ENCOUNTER — Telehealth: Payer: Self-pay

## 2021-12-31 NOTE — Telephone Encounter (Signed)
Returned pt's phone call regarding medication questions. Left message.

## 2022-01-02 NOTE — Telephone Encounter (Signed)
Pt is calling again about the bp meds  she stated that the meds showed up with out speaking to Dr Humphrey Rolls  can some one please call her back

## 2022-01-16 ENCOUNTER — Ambulatory Visit
Admission: RE | Admit: 2022-01-16 | Discharge: 2022-01-16 | Disposition: A | Discharge status: Home / Self Care | Payer: No Typology Code available for payment source | Source: Ambulatory Visit | Attending: Obstetrics and Gynecology | Admitting: Obstetrics and Gynecology

## 2022-01-16 DIAGNOSIS — Z1231 Encounter for screening mammogram for malignant neoplasm of breast: Secondary | ICD-10-CM

## 2022-01-20 ENCOUNTER — Other Ambulatory Visit (HOSPITAL_COMMUNITY): Payer: Self-pay

## 2022-01-22 ENCOUNTER — Other Ambulatory Visit (HOSPITAL_COMMUNITY): Payer: Self-pay

## 2022-02-24 ENCOUNTER — Other Ambulatory Visit: Payer: Self-pay

## 2022-02-26 ENCOUNTER — Ambulatory Visit (INDEPENDENT_AMBULATORY_CARE_PROVIDER_SITE_OTHER): Payer: Self-pay

## 2022-02-26 ENCOUNTER — Other Ambulatory Visit: Payer: Self-pay

## 2022-02-26 VITALS — BP 122/84 | HR 78 | Temp 98.4°F | Ht 68.0 in | Wt 229.1 lb

## 2022-02-26 DIAGNOSIS — N1832 Chronic kidney disease, stage 3b: Secondary | ICD-10-CM

## 2022-02-26 DIAGNOSIS — E119 Type 2 diabetes mellitus without complications: Secondary | ICD-10-CM

## 2022-02-26 DIAGNOSIS — I1A0 Resistant hypertension: Secondary | ICD-10-CM

## 2022-02-26 DIAGNOSIS — I129 Hypertensive chronic kidney disease with stage 1 through stage 4 chronic kidney disease, or unspecified chronic kidney disease: Secondary | ICD-10-CM

## 2022-02-26 DIAGNOSIS — Z794 Long term (current) use of insulin: Secondary | ICD-10-CM

## 2022-02-26 DIAGNOSIS — Z87891 Personal history of nicotine dependence: Secondary | ICD-10-CM

## 2022-02-26 DIAGNOSIS — E1122 Type 2 diabetes mellitus with diabetic chronic kidney disease: Secondary | ICD-10-CM

## 2022-02-26 DIAGNOSIS — L729 Follicular cyst of the skin and subcutaneous tissue, unspecified: Secondary | ICD-10-CM

## 2022-02-26 LAB — GLUCOSE, CAPILLARY: Glucose-Capillary: 124 mg/dL — ABNORMAL HIGH (ref 70–99)

## 2022-02-26 LAB — POCT GLYCOSYLATED HEMOGLOBIN (HGB A1C): Hemoglobin A1C: 8.6 % — AB (ref 4.0–5.6)

## 2022-02-26 MED ORDER — BASAGLAR KWIKPEN 100 UNIT/ML ~~LOC~~ SOPN: 25.0000 [IU] | PEN_INJECTOR | Freq: Every day | SUBCUTANEOUS | 5 refills | Status: DC

## 2022-02-26 NOTE — Patient Instructions (Signed)
Ms.Emmersyn D Jerline Pain, it was a pleasure seeing you today!  Today we discussed: Diabetes - Reduce glargine to 25 u nightly.  Hypertension - continue current regimen. Hold chlorthalidone as need per discussion with nephrology Will place referral for ophthalmology visit  I have ordered the following labs today:   Lab Orders         Glucose, capillary         POC Hbg A1C      Tests ordered today:  none  Referrals ordered today:   Referral Orders  No referral(s) requested today     I have ordered the following medication/changed the following medications:   Stop the following medications: Medications Discontinued During This Encounter  Medication Reason   Insulin Glargine (BASAGLAR KWIKPEN) 100 UNIT/ML Reorder     Start the following medications: Meds ordered this encounter  Medications   Insulin Glargine (BASAGLAR KWIKPEN) 100 UNIT/ML    Sig: Inject 25 Units into the skin at bedtime.    Dispense:  15 mL    Refill:  5     Follow-up:  2-4 weeks    Please make sure to arrive 15 minutes prior to your next appointment. If you arrive late, you may be asked to reschedule.   We look forward to seeing you next time. Please call our clinic at [phone] if you have any questions or concerns. The best time to call is Monday-Friday from 9am-4pm, but there is someone available 24/7. If after hours or the weekend, call the main hospital number and ask for the Internal Medicine Resident On-Call. If you need medication refills, please notify your pharmacy one week in advance and they will send [place] a request.  Thank you for letting us take part in your care. Wishing you the best!  Thank you, Linward Natal, MD

## 2022-02-26 NOTE — Progress Notes (Addendum)
   CC: BP recheck, DM follow up  HPI:  Ashley Long is a 61 y.o. with past medical history as below who presents for BP check and DMII follow up.  Past Medical History:  Diagnosis Date   AKI (acute kidney injury) ([place]) 07/17/2017   Chronic kidney disease (CKD), stage III (moderate) (HCC)    DIABETES MELLITUS, TYPE II 06/02/2006   Last HBA1C 8.7. , check 3 times daily   Epidermal inclusion cyst 03/23/2020   Hematoma of right breast 04/10/2021   Hydronephrosis    sees Dr Royce Macadamia at [facility] ,   [place] 06/02/2006   Qualifier: Diagnosis of  By: Patrice Paradise MD, Roderic Palau     HYPERTENSION 06/02/2006   Filed Vitals:   01/21/11 1134  BP: 162/98  Pulse: 80  Temp: 97.4 F (36.3 C)  TempSrc: Oral  Resp: 20  Height: '5\' 8"'$  (1.727 m)  Weight: 261 lb (118.389 kg)      Injury    Persistent proteinuria 01/21/2018   Stage 2 chronic kidney disease 12/02/2017   now stage 3    Suprapubic pain 05/17/2020   Wound of right leg    right abover ankle, patient report sshe sustained a week ago from today  (11-08-2018) after hitting her leg on an object , reports wound is scabbed over and her pcp checked it out at [facility]  some days ago and was not converned, patient denies redness or pain at sit eof wound    Review of Systems:  See detailed assessment and plan for pertinent ROS.  Physical Exam:  Vitals:   02/26/22 1547  Pulse: 78  Temp: 98.4 F (36.9 C)  TempSrc: Oral  SpO2: 100%  Weight: 229 lb 1.6 oz (103.9 kg)  Height: '5\' 8"'$  (1.727 m)   ***  Assessment & Plan:   See Encounters Tab for problem based charting.  Type 2 diabetes mellitus with stage 3b chronic kidney disease, with long-term current use of insulin (HCC) -     POCT glycosylated hemoglobin (Hb A1C)  Other orders -     Glucose, capillary     Patient {GC/GE:[id number]::"discussed with","seen with"} Dr. {NAMES:[id number]::"Guilloud","Hoffman","Mullen","Narendra","Williams","Vincent"}

## 2022-02-27 DIAGNOSIS — L729 Follicular cyst of the skin and subcutaneous tissue, unspecified: Secondary | ICD-10-CM | POA: Insufficient documentation

## 2022-02-27 NOTE — Assessment & Plan Note (Signed)
BP 122/84 today. This is a great improvement but there is a possibility we've overcorrected. Occasional low readings, lowest sdeen on home cuff 87/59. Denies symptoms of orthostatic hypotension, nausea, vomiting, lightheadedness. Denies headaches, vision changes, chest pain, shortness of breath. Cardiopulmonary exam normal with no lower extremity edema. Nephrologist instructed to hold chlorthalidone if symptomatic hypotension or low readings in the morning.   Plan: Will continue current regimen with carvedilol 25 mg twice daily, chlorthalidone 25 mg daily (hold if symptoms), and clonidine 0.3 twice daily. Would prefer prn holding of clonidine but will defer to nephrology.

## 2022-02-27 NOTE — Assessment & Plan Note (Signed)
Patient with DMII. A1c up to 8.6 from 8.0. Despite changes discussed at last visit, availability issues resulted in patient continuing on previous regimen which includes glargine 30 u qhs, humalog 3-8 SS with meals, Invokana 300 mg once daily and Janumet 50-1000 twice daily. Endorses occasionally feeling hypoglycemic. Brings home log with fasting sugars labile, often times in the 50s but also often in the 300s. Reports consistent diet and good medication compliance. Feel hypoglycemia is greatest short term risk. Despite increased A1c, will decrease glargine.  Plan: Decreased glargine to 25 u nightly. Referral placed for ophthalmology. Patient may require CGM in the future. Patient will call podiatry for foot exam per her preference.

## 2022-02-27 NOTE — Assessment & Plan Note (Signed)
Patient endorses nontender cyst at left medial buttock. Told by dermatology it was too deep for them to address. Exam with 2-3 cm cyst without erythema, discharge, or tenderness at left medial buttock. Likely amenable to I&D. Most likely sebaceous cyst.  -will schedule dedicated procedure visit in next 2-4 weeks

## 2022-03-12 NOTE — Progress Notes (Signed)
Internal Medicine Clinic Attending  I saw and evaluated the patient.  I personally confirmed the key portions of the history and exam documented by Dr. Dema Severin and I reviewed pertinent patient test results.  The assessment, diagnosis, and plan were formulated together and I agree with the documentation in the resident's note. Ms. Hindle lesion is most c/w a carbuncle rather than a single pocket.  Lancing a main pocket may provide some relief (sometimes these drain on their own and can "heal" (a thickened knot will remain though inflammation subsides) and can be done in [facility].  Cure would involve resection done by a general surgeon outpatient procedure.

## 2022-03-25 ENCOUNTER — Ambulatory Visit (INDEPENDENT_AMBULATORY_CARE_PROVIDER_SITE_OTHER): Payer: Self-pay

## 2022-03-25 ENCOUNTER — Other Ambulatory Visit: Payer: Self-pay

## 2022-03-25 VITALS — BP 150/76 | HR 76 | Temp 98.0°F | Wt 224.6 lb

## 2022-03-25 DIAGNOSIS — D179 Benign lipomatous neoplasm, unspecified: Secondary | ICD-10-CM | POA: Insufficient documentation

## 2022-03-25 DIAGNOSIS — I10 Essential (primary) hypertension: Secondary | ICD-10-CM

## 2022-03-25 DIAGNOSIS — D171 Benign lipomatous neoplasm of skin and subcutaneous tissue of trunk: Secondary | ICD-10-CM | POA: Insufficient documentation

## 2022-03-25 DIAGNOSIS — L729 Follicular cyst of the skin and subcutaneous tissue, unspecified: Secondary | ICD-10-CM

## 2022-03-25 NOTE — Progress Notes (Signed)
   CC: lipoma excision  HPI:  Ashley Long is a 62 y.o. with past medical history as below who presents for excision of lipoma at her left buttock.  See detailed assessment and plan for HPI.  Past Medical History:  Diagnosis Date   AKI (acute kidney injury) ([place]) 07/17/2017   Chronic kidney disease (CKD), stage III (moderate) (HCC)    DIABETES MELLITUS, TYPE II 06/02/2006   Last HBA1C 8.7. , check 3 times daily   Epidermal inclusion cyst 03/23/2020   Hematoma of right breast 04/10/2021   Hydronephrosis    sees Dr Royce Macadamia at [facility] ,   [place] 06/02/2006   Qualifier: Diagnosis of  By: Patrice Paradise MD, Roderic Palau     HYPERTENSION 06/02/2006   Filed Vitals:   01/21/11 1134  BP: 162/98  Pulse: 80  Temp: 97.4 F (36.3 C)  TempSrc: Oral  Resp: 20  Height: '5\' 8"'$  (1.727 m)  Weight: 261 lb (118.389 kg)      Injury    Persistent proteinuria 01/21/2018   Stage 2 chronic kidney disease 12/02/2017   now stage 3    Suprapubic pain 05/17/2020   Wound of right leg    right abover ankle, patient report sshe sustained a week ago from today  (11-08-2018) after hitting her leg on an object , reports wound is scabbed over and her pcp checked it out [facility]  some days ago and was not converned, patient denies redness or pain at sit eof wound    Review of Systems: See detailed assessment and plan for pertinent ROS.  Physical Exam:  Vitals:   03/25/22 0957  BP: (!) 150/76  Pulse: 76  Temp: 98 F (36.7 C)  TempSrc: Oral  SpO2: 100%  Weight: 224 lb 9.6 oz (101.9 kg)   Physical Exam Constitutional:      General: She is not in acute distress. HENT:     Head: Normocephalic and atraumatic.  Eyes:     Extraocular Movements: Extraocular movements intact.  Pulmonary:     Effort: Pulmonary effort is normal.  Musculoskeletal:     Cervical back: Neck supple.  Skin:    General: Skin is warm and dry.     Comments: 2-3 cm mass at medial left buttock consistent with lipoma. No erythema or  discharge. Skin intact.  Neurological:     Mental Status: She is alert and oriented to person, place, and time.  Psychiatric:        Mood and Affect: Mood normal.        Behavior: Behavior normal.      Assessment & Plan:   See Encounters Tab for problem based charting.  Lipoma of buttock Patient presents for f/u of lipoma at medial left buttock. She was seen in this clinic recently and the lesion was felt to be a sebaceous cyst amenable to I&D. Upon further review of chart and more thorough examination, lesion seems most consistent with lipoma. I feel it is more appropriate patient be evaluated by general surgery given the depth of the lesion. 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 The lipoma continues not to be painful and does not have any drainage or erythema. Similar in size as at last visit, about 2-3 cm. No charge for today's visit. -referral to general surgery for possible excision    Patient seen with Dr. Philipp Ovens

## 2022-03-25 NOTE — Assessment & Plan Note (Signed)
Patient presents for f/u of lipoma at medial left buttock. She was seen in this clinic recently and the lesion was felt to be a sebaceous cyst amenable to I&D. Upon further review of chart and more thorough examination, lesion seems most consistent with lipoma. I feel it is more appropriate patient be evaluated by general surgery given the depth of the lesion. 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 The lipoma continues not to be painful and does not have any drainage or erythema. Similar in size as at last visit, about 2-3 cm. No charge for today's visit. -referral to general surgery for possible excision

## 2022-03-26 NOTE — Progress Notes (Signed)
Internal Medicine Clinic Attending  Case discussed with Dr. White  At the time of the visit.  We reviewed the resident's history and exam and pertinent patient test results.  I agree with the assessment, diagnosis, and plan of care documented in the resident's note.  

## 2022-04-01 ENCOUNTER — Telehealth: Payer: Self-pay | Admitting: Student

## 2022-04-01 NOTE — Telephone Encounter (Signed)
Pt requesting a call back.  Patient has questions about the following medication  . chlorthalidone (HYGROTON) 25 MG tablet

## 2022-04-02 ENCOUNTER — Other Ambulatory Visit: Payer: Self-pay | Admitting: Internal Medicine

## 2022-04-02 ENCOUNTER — Other Ambulatory Visit: Payer: Self-pay

## 2022-04-02 ENCOUNTER — Telehealth: Payer: Self-pay | Admitting: *Deleted

## 2022-04-02 DIAGNOSIS — E119 Type 2 diabetes mellitus without complications: Secondary | ICD-10-CM

## 2022-04-02 DIAGNOSIS — I10 Essential (primary) hypertension: Secondary | ICD-10-CM

## 2022-04-02 NOTE — Telephone Encounter (Signed)
Return pt's call - no answer; left message on self-identified vm of the office's return call.

## 2022-04-02 NOTE — Telephone Encounter (Signed)
Call from patient was started on Chlorthalidone.  Was told to stop if she has Diarrhe or Nausea. Had some at first but continued to take.  Is drinking plenty of fluids wants to speak wit the doctor to see if she should se continue to take the medication.

## 2022-04-03 ENCOUNTER — Other Ambulatory Visit: Payer: Self-pay | Admitting: Internal Medicine

## 2022-04-03 DIAGNOSIS — I10 Essential (primary) hypertension: Secondary | ICD-10-CM

## 2022-04-03 DIAGNOSIS — N183 Chronic kidney disease, stage 3 unspecified: Secondary | ICD-10-CM

## 2022-04-04 NOTE — Telephone Encounter (Signed)
Return call to patient after speaking with Dr. Altamease Oiler.  If Diarrhea and Nausea have lessened and /or stopped patient can continue taking the Chlorthalidone.  Message was left on voice mail to call if she has questions.

## 2022-04-05 ENCOUNTER — Other Ambulatory Visit: Payer: Self-pay | Admitting: Student

## 2022-04-05 DIAGNOSIS — I10 Essential (primary) hypertension: Secondary | ICD-10-CM

## 2022-04-07 ENCOUNTER — Other Ambulatory Visit (HOSPITAL_COMMUNITY): Payer: Self-pay

## 2022-04-07 MED ORDER — CHLORTHALIDONE 25 MG PO TABS
25.0000 mg | ORAL_TABLET | Freq: Every day | ORAL | 2 refills | Status: DC
  Filled 2022-04-07: qty 30, 30d supply, fill #0
  Filled 2022-04-08: qty 30, 30d supply, fill #1
  Filled ????-??-??: fill #1

## 2022-04-08 ENCOUNTER — Other Ambulatory Visit: Payer: Self-pay | Admitting: Student

## 2022-04-08 ENCOUNTER — Other Ambulatory Visit (HOSPITAL_COMMUNITY): Payer: Self-pay

## 2022-04-08 DIAGNOSIS — I10 Essential (primary) hypertension: Secondary | ICD-10-CM

## 2022-04-08 MED ORDER — CHLORTHALIDONE 25 MG PO TABS
25.0000 mg | ORAL_TABLET | Freq: Every day | ORAL | 2 refills | Status: DC
  Filled 2022-05-09: qty 30, 30d supply, fill #0
  Filled 2022-06-05: qty 90, 90d supply, fill #1
  Filled 2022-06-05: qty 30, 30d supply, fill #1
  Filled 2022-07-02: qty 30, 30d supply, fill #2
  Filled 2022-08-05: qty 30, 30d supply, fill #3
  Filled 2022-08-31: qty 30, 30d supply, fill #4
  Filled 2022-10-05: qty 30, 30d supply, fill #5
  Filled 2022-11-03: qty 30, 30d supply, fill #6
  Filled 2022-12-02: qty 30, 30d supply, fill #7
  Filled 2022-12-31: qty 30, 30d supply, fill #8

## 2022-04-28 ENCOUNTER — Other Ambulatory Visit: Payer: Self-pay

## 2022-04-28 NOTE — Telephone Encounter (Signed)
Patient requesting a 90 day supply

## 2022-05-01 MED ORDER — VITAMIN D 25 MCG (1000 UNIT) PO TABS: 1000.0000 [IU] | ORAL_TABLET | ORAL | 3 refills | Status: AC

## 2022-05-06 ENCOUNTER — Other Ambulatory Visit (HOSPITAL_COMMUNITY): Payer: Self-pay

## 2022-05-09 ENCOUNTER — Other Ambulatory Visit: Payer: Self-pay

## 2022-05-09 ENCOUNTER — Other Ambulatory Visit (HOSPITAL_COMMUNITY): Payer: Self-pay

## 2022-05-12 ENCOUNTER — Telehealth: Payer: Self-pay | Admitting: *Deleted

## 2022-05-12 NOTE — Telephone Encounter (Signed)
Received a call from pt who stated she received a text from [facility] stating Janumet will no longer be available. Pt stated she has enough medication until her next refill in which she will call back at that time. She wanted her doctor be informed a head of time. Thanks

## 2022-05-13 ENCOUNTER — Other Ambulatory Visit (HOSPITAL_COMMUNITY): Payer: Self-pay

## 2022-05-14 ENCOUNTER — Other Ambulatory Visit (HOSPITAL_COMMUNITY): Payer: Self-pay

## 2022-05-14 ENCOUNTER — Other Ambulatory Visit: Payer: Self-pay | Admitting: Student

## 2022-05-14 DIAGNOSIS — E119 Type 2 diabetes mellitus without complications: Secondary | ICD-10-CM

## 2022-05-14 MED ORDER — JANUMET 50-1000 MG PO TABS
1.0000 | ORAL_TABLET | Freq: Two times a day (BID) | ORAL | 11 refills | Status: DC
  Filled 2022-05-14: qty 60, 30d supply, fill #0

## 2022-05-14 NOTE — Telephone Encounter (Signed)
RTC to patient informed her that her prescription for the Janumet has been called to th [facility] at a cost of  $4.00

## 2022-05-21 ENCOUNTER — Other Ambulatory Visit: Payer: Self-pay | Admitting: General Surgery

## 2022-06-05 ENCOUNTER — Other Ambulatory Visit (HOSPITAL_COMMUNITY): Payer: Self-pay

## 2022-06-05 ENCOUNTER — Other Ambulatory Visit: Payer: Self-pay | Admitting: Student

## 2022-06-05 DIAGNOSIS — I10 Essential (primary) hypertension: Secondary | ICD-10-CM

## 2022-06-05 MED ORDER — AMLODIPINE-OLMESARTAN 5-20 MG PO TABS
2.0000 | ORAL_TABLET | Freq: Every day | ORAL | 0 refills | Status: DC
  Filled 2022-06-05: qty 60, 30d supply, fill #0

## 2022-06-23 ENCOUNTER — Ambulatory Visit (INDEPENDENT_AMBULATORY_CARE_PROVIDER_SITE_OTHER): Payer: Self-pay | Admitting: Student

## 2022-06-23 VITALS — BP 124/88 | HR 80 | Temp 98.1°F | Wt 226.2 lb

## 2022-06-23 DIAGNOSIS — I129 Hypertensive chronic kidney disease with stage 1 through stage 4 chronic kidney disease, or unspecified chronic kidney disease: Secondary | ICD-10-CM

## 2022-06-23 DIAGNOSIS — Z794 Long term (current) use of insulin: Secondary | ICD-10-CM

## 2022-06-23 DIAGNOSIS — N1832 Chronic kidney disease, stage 3b: Secondary | ICD-10-CM

## 2022-06-23 DIAGNOSIS — E1122 Type 2 diabetes mellitus with diabetic chronic kidney disease: Secondary | ICD-10-CM

## 2022-06-23 DIAGNOSIS — I10 Essential (primary) hypertension: Secondary | ICD-10-CM

## 2022-06-23 DIAGNOSIS — Z7984 Long term (current) use of oral hypoglycemic drugs: Secondary | ICD-10-CM

## 2022-06-23 DIAGNOSIS — I1A0 Resistant hypertension: Secondary | ICD-10-CM

## 2022-06-23 LAB — POCT GLYCOSYLATED HEMOGLOBIN (HGB A1C): Hemoglobin A1C: 8.8 % — AB (ref 4.0–5.6)

## 2022-06-23 LAB — GLUCOSE, CAPILLARY: Glucose-Capillary: 190 mg/dL — ABNORMAL HIGH (ref 70–99)

## 2022-06-23 MED ORDER — AMLODIPINE-OLMESARTAN 5-20 MG PO TABS
1.0000 | ORAL_TABLET | Freq: Every day | ORAL | 0 refills | Status: DC
  Filled 2022-08-07: qty 30, 30d supply, fill #0

## 2022-06-23 NOTE — Assessment & Plan Note (Signed)
A1c 8.8 today up from 8.6 two months ago. Average fasting glucose readings have been in the 150s-160s at home. Pt reports she has had several low readings in the 70s to 80s when she checks at night, however has not had a blood sugar below 60 in the last 3 months. She does get diaphoretic during these times, but denies any palpitation, syncopal events, or tremors. Pt does consume 3 meals a day. Typically eats oatmeal or cereal at breakfast, a salad or [place] sandwich for lung, and baked protein with rice and veggies for dinner. Current medication regimen includes insulin glargine 25 units at bedtime, lispro 4 units with meals, Janument 50-1000 BID (states she has only been taking this once a day) and Invokana 300 mg daily. Given elevated A1c and consistently elevated fasting blood sugar, plan to increase long acting insulin glargine which patient takes at dinner. Additionally, will increase lispro to 5 units with meals.   > Increase insulin glargine to 28 units daily > Increase mealtime lispro to 5 units  > Janument 50-1000 BID > Invokana 300 mg daily

## 2022-06-23 NOTE — Assessment & Plan Note (Signed)
BP 124/88 today. Patient does have a cuff at home. States her blood pressure is usually well controlled around 120s/80s but several times a week she does get hypotensive. Lowest reading was around 64/40s. Patient is asymptomatic during these episodes denying any palpitations, shortness of breath, dizziness, or lightheadedness. Current medication regimen includes amlodipine-olmesartan 5-20, carvedilol 25, chlorthalidone 25 mg and clonidine 0.3 mg. Given patient's is normotensive most of the time an appears asymptomatic during hypotensive episodes, will continue current regimen. Will consider increasing amlodipine-olmesartan and decreasing clonidine at next visit due to adverse side effects.   > amlodipine-olmesartan 5-20 > carvedilol 25 mg > chlorthalidone 25 mg > clonidine 0.3 mg

## 2022-06-23 NOTE — Progress Notes (Signed)
Subjective:   Patient ID: Ashley Long female   DOB: [birth year] 62 y.o.   MRN: [id number]  HPI: Ashley Long is a 62 y.o. female with a PmHx as presented below who presents for follow up of hypertension and T2DM. Please see problem based assessment and plan for further work up and management.   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     amLODipine-olmesartan (AZOR) 5-20 MG tablet Take 1 tablet by mouth daily. 180 tablet 0   ascorbic acid (VITAMIN C) 500 MG tablet Take 500 mg by mouth daily.     ASPIRIN LOW DOSE 81 MG tablet TAKE 1 Tablet BY MOUTH ONCE EVERY DAY 90 tablet 3   atorvastatin (LIPITOR) 40 MG tablet TAKE 1 Tablet BY MOUTH ONCE EVERY DAY 90 tablet 3   Blood Glucose Monitoring Suppl (WAVESENSE PRESTO) W/DEVICE KIT 1 each by Does not apply route 2 (two) times daily. 1 each 0   carvedilol (COREG) 25 MG tablet TAKE 1 Tablet  BY MOUTH TWICE DAILY 180 tablet 3   chlorthalidone (HYGROTON) 25 MG tablet Take 1 tablet (25 mg total) by mouth daily 90 tablet 2   cholecalciferol (VITAMIN D3) 25 MCG (1000 UNIT) tablet Take 1 tablet (1,000 Units total) by mouth 3 (three) times a week. 36 tablet 3   cloNIDine (CATAPRES) 0.3 MG tablet TAKE 1 Tablet  BY MOUTH TWICE  DAILY 180 tablet 3   glucose blood (WAVESENSE PRESTO) test strip Use to test blood sugar 3 times daily. diag code E11.9. Insulin dependent 100 each 11   Insulin Glargine (BASAGLAR KWIKPEN) 100 UNIT/ML Inject 25 Units into the skin at bedtime. 15 mL 5   insulin lispro (HUMALOG KWIKPEN) 100 UNIT/ML KwikPen INJECT 3 TO 8 UNITS UNDER THE SKIN 3 TIMES DAILY 15 mL 5   Insulin Pen Needle (PEN NEEDLES) 31G X 5 MM MISC 4 Doses/Fill by Does not apply route daily. 100 each 11   Insulin Syringe-Needle U-100 31G X 15/64" 0.3 ML MISC 1 each by Does not apply route 2 (two) times daily with a meal. 100 each 11   INVOKANA 300 MG TABS tablet TAKE 1 Tablet BY MOUTH ONCE EVERY DAY BEFORE BREAKFAST 30 tablet 5   Lancets 30G MISC 1 each by Does not apply route 2 (two) times daily. 100 each 12   magnesium oxide (MAG-OX) 400 MG tablet Take 400 mg by mouth daily.     omeprazole (PRILOSEC) 40 MG capsule Take 1 capsule (40 mg total) by mouth daily. 90 capsule 3   Polyethyl Glycol-Propyl Glycol (SYSTANE OP) Place 1 drop into both eyes daily as needed (irritation).     polyethylene glycol (MIRALAX / GLYCOLAX) 17 g packet Take 17 g by mouth daily as needed for moderate constipation.  sitaGLIPtin-metformin (JANUMET) 50-1000 MG tablet Take 1 tablet by mouth 2 (two) times daily with a meal. 60 tablet 11   valACYclovir (VALTREX) 500 MG tablet Take 500 mg by mouth 2 (two) times daily as needed (fever blisters).     No current facility-administered medications for this visit.     Review of Systems: Pertinent ROS present in the A&P. Otherwise negative.   Objective:   Physical Exam: Vitals:   06/23/22 1544  BP: 124/88  Pulse: 80  Temp: 98.1 F (36.7 C)  TempSrc: Oral  SpO2: 99%  Weight: 226 lb 3.2 oz (102.6 kg)   Constitutional:      General: No acute distress. HENT:     Head: Normocephalic and atraumatic. Cardiovascular:    RRR Pulmonary:     Lungs CTAB. No increase WOB.  Musculoskeletal:     Normal muscle  tone Skin:    General: Skin is warm and dry.  Feet:      Palpable DP pulses bilaterally. No overlying ulcerations or skin changes.  Neurological:     Mental Status: She is alert and oriented to person, place, and time.  Psychiatric:        Mood and Affect: Mood normal.        Behavior: Behavior normal.   Assessment & Plan:   Diabetes mellitus (HCC) A1c 8.8 today up from 8.6 two months ago. Average fasting glucose readings have been in the 150s-160s at home. Ashley Long reports she has had several low readings in the 70s to 80s when she checks at night, however has not had a blood sugar below 60 in the last 3 months. She does get diaphoretic during these times, but denies any palpitation, syncopal events, or tremors. Ashley Long does consume 3 meals a day. Typically eats oatmeal or cereal at breakfast, a salad or [place] sandwich for lung, and baked protein with rice and veggies for dinner. Current medication regimen includes insulin glargine 25 units at bedtime, lispro 4 units with meals, Janument 50-1000 BID (states she has only been taking this once a day) and Invokana 300 mg daily. Given elevated A1c and consistently elevated fasting blood sugar, plan to increase long acting insulin glargine which patient takes at dinner. Additionally, will increase lispro to 5 units with meals.   > Increase insulin glargine to 28 units daily > Increase mealtime lispro to 5 units  > Janument 50-1000 BID > Invokana 300 mg daily   Resistant hypertension BP 124/88 today. Patient does have a cuff at home. States her blood pressure is usually well controlled around 120s/80s but several times a week she does get hypotensive. Lowest reading was around 64/40s. Patient is asymptomatic during these episodes denying any palpitations, shortness of breath, dizziness, or lightheadedness. Current medication regimen includes amlodipine-olmesartan 5-20, carvedilol 25, chlorthalidone 25 mg and clonidine 0.3 mg. Given patient's is normotensive  most of the time an appears asymptomatic during hypotensive episodes, will continue current regimen. Will consider increasing amlodipine-olmesartan and decreasing clonidine at next visit due to adverse side effects.   > amlodipine-olmesartan 5-20 > carvedilol 25 mg > chlorthalidone 25 mg > clonidine 0.3 mg    Attestation for Student Documentation:  I personally was present and performed or re-performed the history, physical exam and medical decision-making activities of this service and have verified that the service and findings are accurately documented in the student's note.  Merrilyn Puma, MD 06/24/2022, 8:28 AM

## 2022-06-23 NOTE — Patient Instructions (Signed)
Ms. Krayer,  It was a pleasure seeing you in the clinic today.   For your diabetes: Your A1c level is 8.8% today. We are planning to increase your long-acting insulin glargine (nighttime) to 28 units every night. Along with this, we recommend increasing your meal-time insulin to 5 units with each meal.  You got your foot exam done today. Please come back in 1 month for your next visit. Please bring your meter with you to this next visit.  Please call our clinic at [phone] if you have any questions or concerns. The best time to call is Monday-Friday from 9am-4pm, but there is someone available 24/7 at the same number. If you need medication refills, please notify your pharmacy one week in advance and they will send [place] a request.   Thank you for letting us take part in your care. We look forward to seeing you next time!

## 2022-06-29 NOTE — Progress Notes (Addendum)
Patient encounter discussed in detail with student doctor Benancio Deeds and Dr. Austin Miles and I agree with documentation.

## 2022-07-02 ENCOUNTER — Other Ambulatory Visit (HOSPITAL_COMMUNITY): Payer: Self-pay

## 2022-07-02 NOTE — Telephone Encounter (Signed)
Submitted application for JANUMET to [facility] for patient assistance. VIA MAIL  Phone: [phone]

## 2022-07-07 IMAGING — MG MM DIGITAL SCREENING BILAT W/ TOMO AND CAD
8 series · 8 of 24 positions shown · non-contrast
Comparison: Previous exam(s).

ACR Breast Density Category a: The breast tissue is almost entirely
fatty.

CLINICAL DATA: Screening.

EXAM:
DIGITAL SCREENING BILATERAL MAMMOGRAM WITH TOMOSYNTHESIS AND CAD
TECHNIQUE: Bilateral screening digital craniocaudal and mediolateral oblique
mammograms were obtained. Bilateral screening digital breast
tomosynthesis was performed. The images were evaluated with
computer-aided detection.

[L MLO synth-2D]
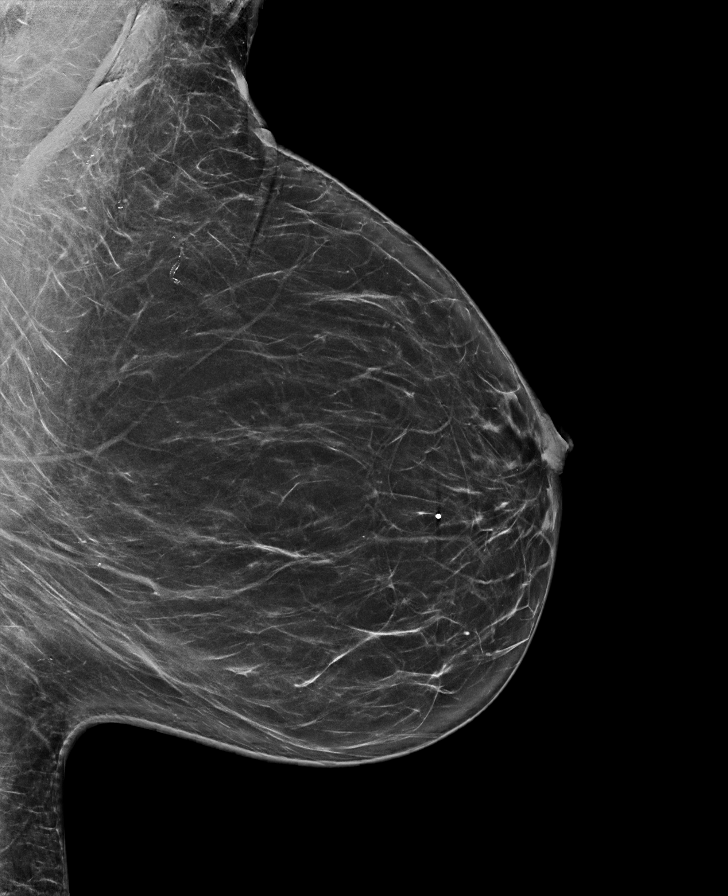

[R CC synth-2D]
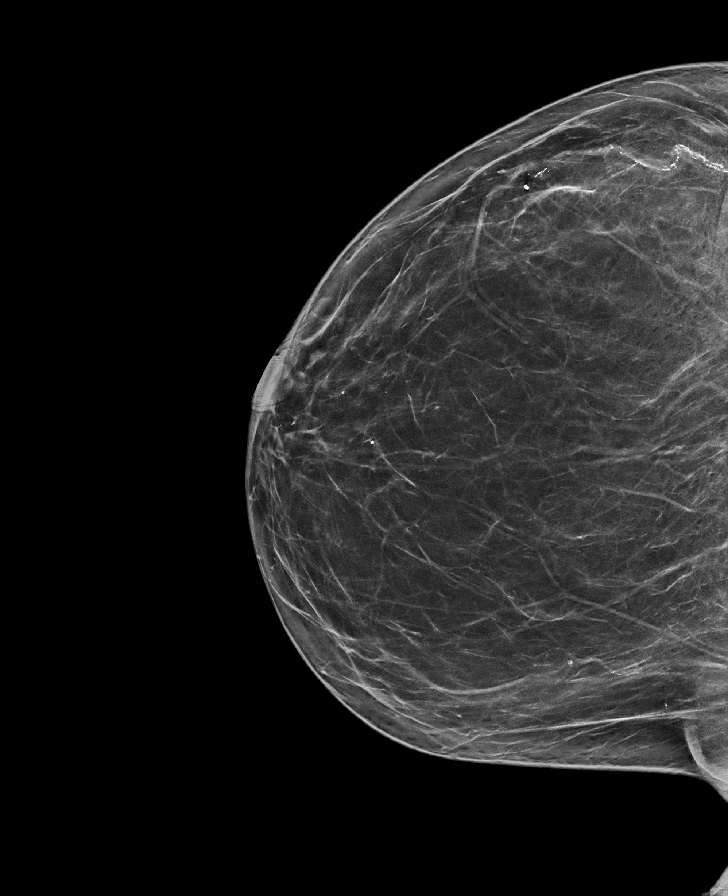

[R MLO synth-2D]
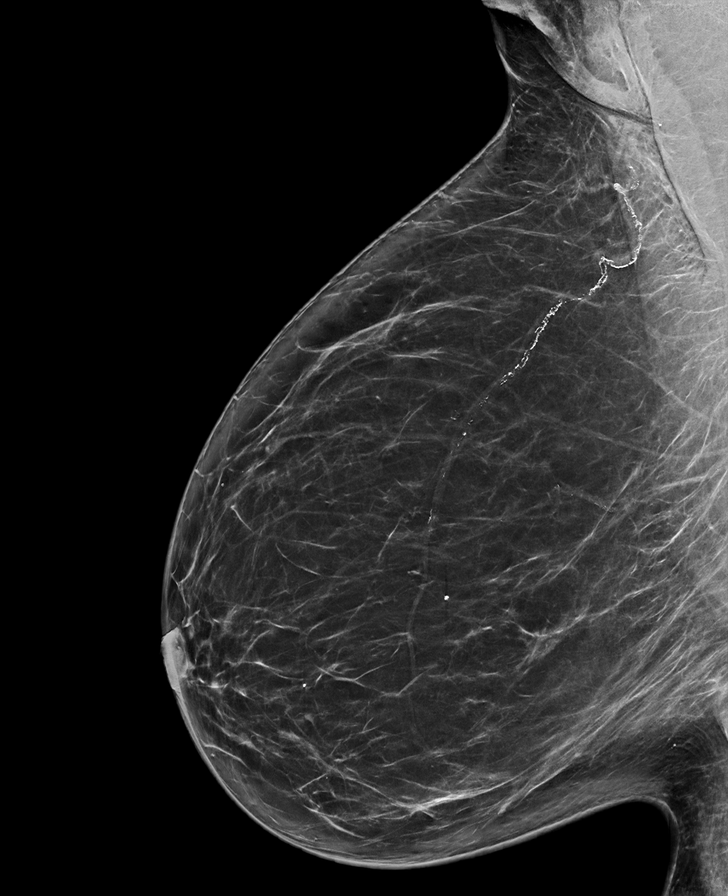

[L CC synth-2D]
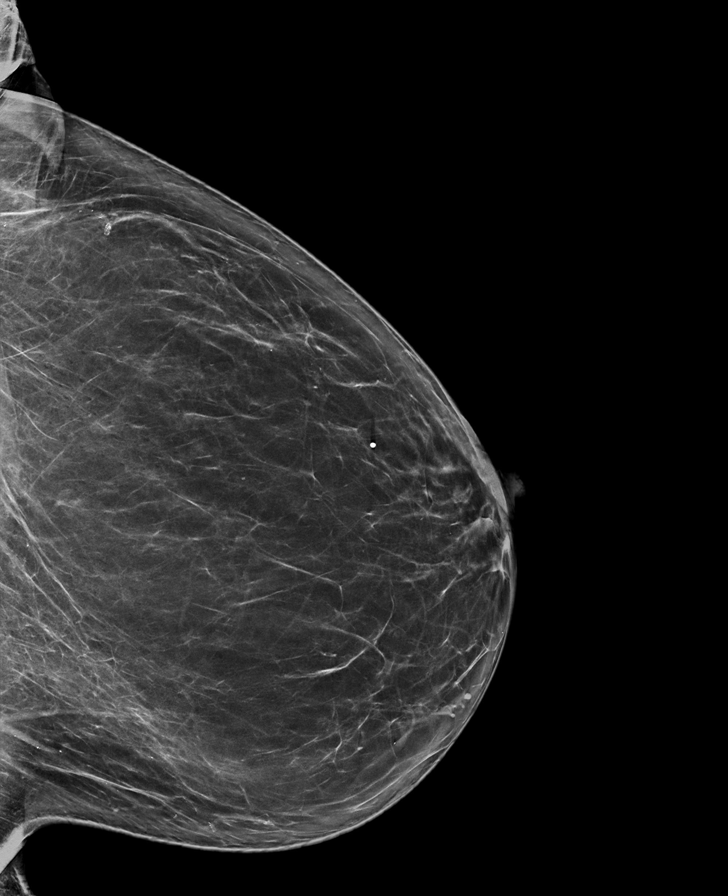

[R MLO tomo · tomo slice 41/80.0]
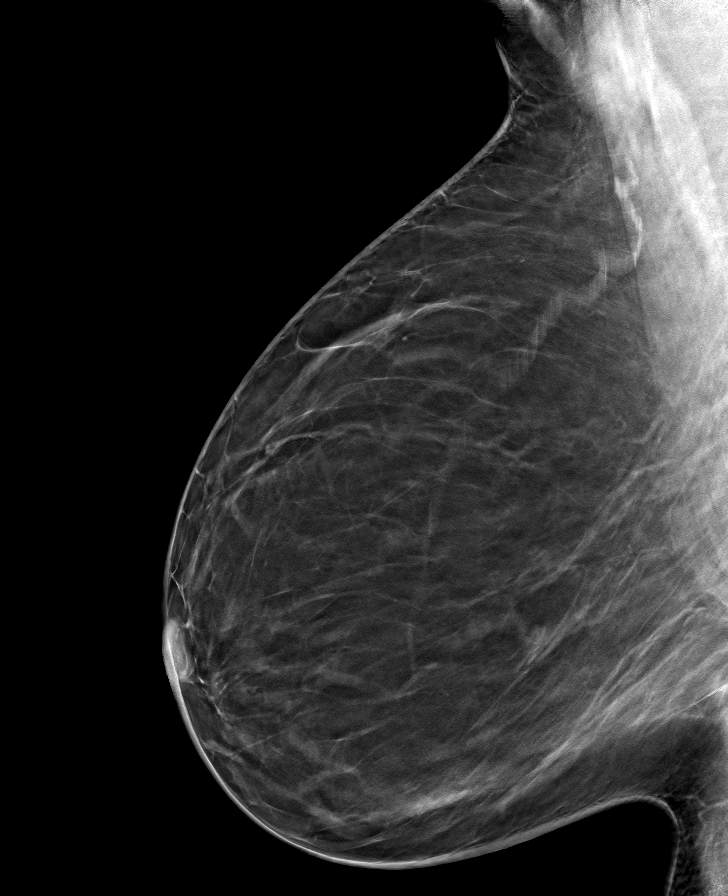

[R CC tomo · tomo slice 41/80.0]
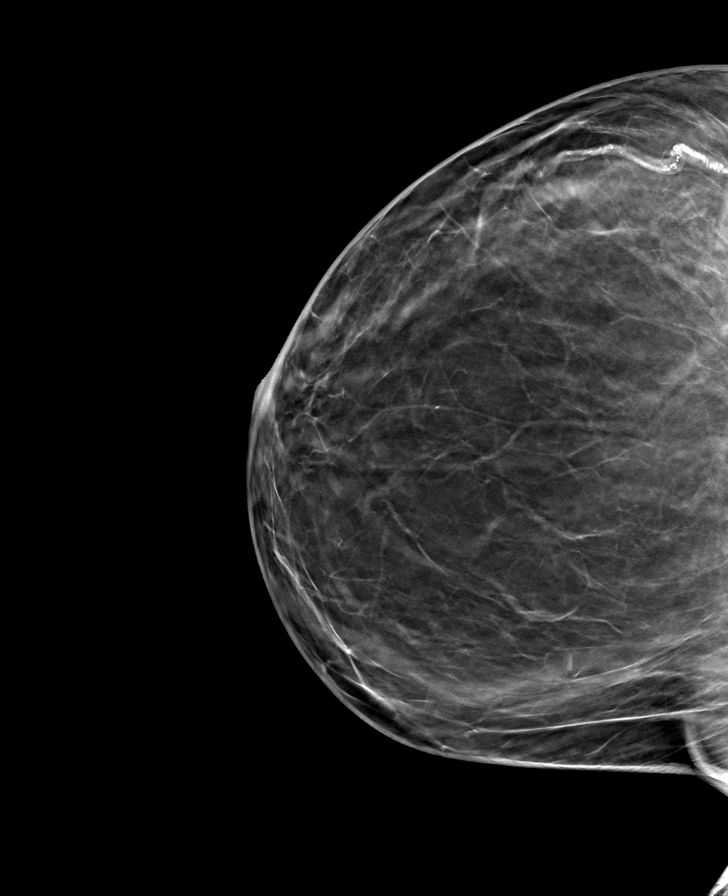

[L MLO tomo · tomo slice 40/79.0]
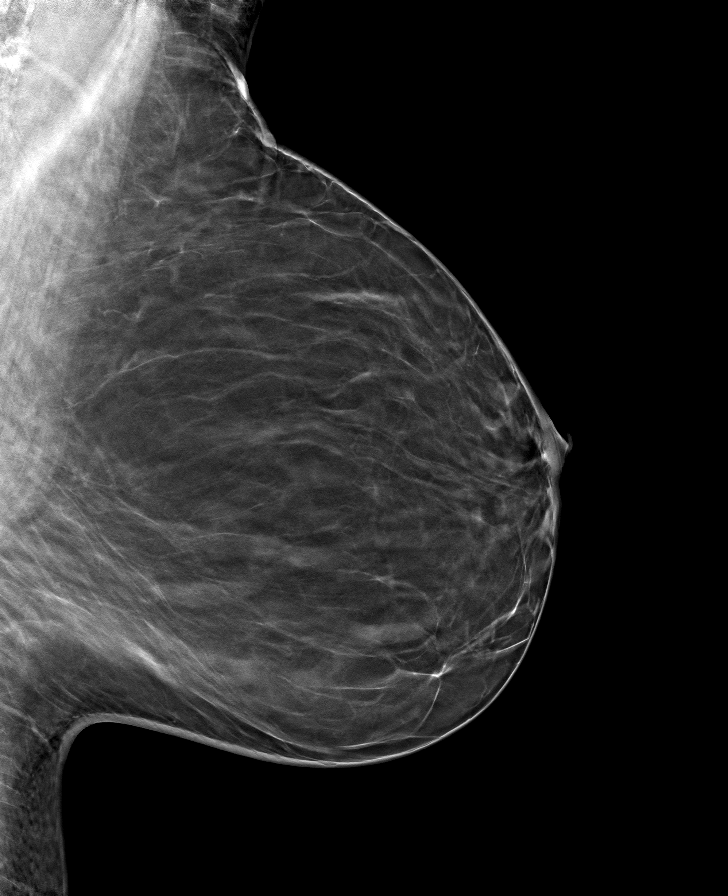

[L CC tomo · tomo slice 39/77.0]
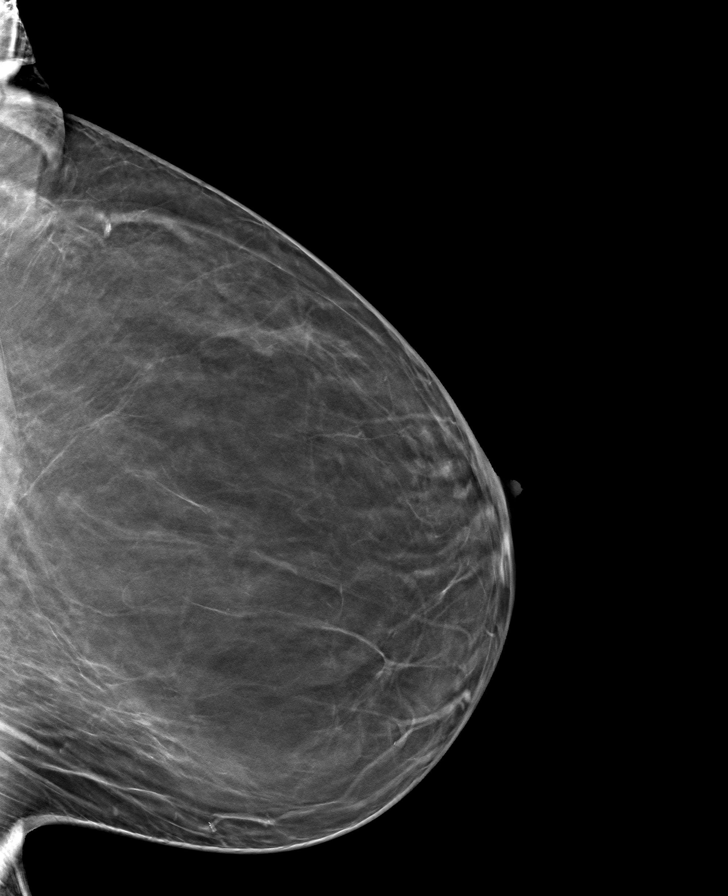

[8 of 24 positions shown; findings below may reference images not displayed]

FINDINGS: There are no findings suspicious for malignancy.
IMPRESSION: No mammographic evidence of malignancy. A result letter of this
screening mammogram will be mailed directly to the patient.

RECOMMENDATION:
Screening mammogram in one year. (Code:0E-3-N98)

BI-RADS CATEGORY  1: Negative.

## 2022-07-15 ENCOUNTER — Encounter: Payer: Self-pay | Admitting: Student

## 2022-07-15 ENCOUNTER — Other Ambulatory Visit (HOSPITAL_COMMUNITY): Payer: Self-pay

## 2022-07-15 ENCOUNTER — Ambulatory Visit (INDEPENDENT_AMBULATORY_CARE_PROVIDER_SITE_OTHER): Payer: Self-pay | Admitting: Student

## 2022-07-15 ENCOUNTER — Telehealth: Payer: Self-pay

## 2022-07-15 VITALS — BP 130/88 | HR 73 | Wt 222.8 lb

## 2022-07-15 DIAGNOSIS — Z794 Long term (current) use of insulin: Secondary | ICD-10-CM

## 2022-07-15 DIAGNOSIS — E119 Type 2 diabetes mellitus without complications: Secondary | ICD-10-CM

## 2022-07-15 DIAGNOSIS — Z7984 Long term (current) use of oral hypoglycemic drugs: Secondary | ICD-10-CM

## 2022-07-15 DIAGNOSIS — M21619 Bunion of unspecified foot: Secondary | ICD-10-CM

## 2022-07-15 DIAGNOSIS — E1122 Type 2 diabetes mellitus with diabetic chronic kidney disease: Secondary | ICD-10-CM

## 2022-07-15 DIAGNOSIS — N1832 Chronic kidney disease, stage 3b: Secondary | ICD-10-CM

## 2022-07-15 DIAGNOSIS — I1A0 Resistant hypertension: Secondary | ICD-10-CM

## 2022-07-15 MED ORDER — BASAGLAR KWIKPEN 100 UNIT/ML ~~LOC~~ SOPN
27.0000 [IU] | PEN_INJECTOR | Freq: Every day | SUBCUTANEOUS | 5 refills | Status: DC
  Filled 2022-07-15: qty 15, 55d supply, fill #0

## 2022-07-15 MED ORDER — CLONIDINE HCL 0.2 MG PO TABS
ORAL_TABLET | ORAL | 0 refills | Status: DC
  Filled 2022-07-15: qty 24, 17d supply, fill #0

## 2022-07-15 MED ORDER — CLONIDINE HCL 0.2 MG PO TABS
ORAL_TABLET | ORAL | 0 refills | Status: DC
  Filled 2022-07-15 (×2): qty 24, 17d supply, fill #0

## 2022-07-15 NOTE — Telephone Encounter (Signed)
Patient called back. She thought Provider was going to send in 2 separate Rxs, one for 0.2 mg and one for 0.1 mg. Explained that she is to take 0.2 mg BID X 7 days then a half tab (0.1 mg total) BID for 10 days. States understanding. No other questions.

## 2022-07-15 NOTE — Patient Instructions (Signed)
Ashley Long,  It was a pleasure seeing you in the clinic today.   For your diabetes, we are decreasing your long-acting insulin to 27 units every night. For your blood pressure: Please take clonidine 0.2mg  twice a day for 1 week. Then, take clonidine 0.1mg  twice a day for 10 days. Please come back to see [place] in 2 weeks for your next visit.  Please call our clinic at [phone] if you have any questions or concerns. The best time to call is Monday-Friday from 9am-4pm, but there is someone available 24/7 at the same number. If you need medication refills, please notify your pharmacy one week in advance and they will send [place] a request.   Thank you for letting us take part in your care. We look forward to seeing you next time!

## 2022-07-15 NOTE — Telephone Encounter (Signed)
Requesting to speak with a nurse about Clonidine. Please call pt back.

## 2022-07-15 NOTE — Assessment & Plan Note (Signed)
Pt here for follow up of resistant HTN. BP today is 130/88. She does check her pressure at home x3 daily. She is usually normotensive but does have a low reading about every other day. Lowest systolic recorded at home was 69. She is asymptomatic during those time denying any dizziness, lightheadedness. Current medications include amlodipine-olmesartan 5-20, carvedilol 25 mg, chlorthalidone 25 mg, and clonidine 0.3. Given BP seems well controlled, with begin tapering down clonidine and follow up in 2 weeks. If pressure not well controlled at that time, will consider increasing dosage of amlodipine-olmesartan.   > clonidine 0.2mg  twice a day for 1 week.then 0.1mg  twice a day for 10 days > amlodipine-olmesartan 5-20 > carvedilol 25 mg > chlorthalidone 25 mg

## 2022-07-15 NOTE — Assessment & Plan Note (Signed)
Pt presents for follow up regarding her diabetes. A1c on 04/08 was 8.8 with average blood glucose in the 150s-160s. Today average blood glucose reading is 134. All pre-prandial readings have remained below 250. Glucose log reveals patient has had 2 episodes of hypoglycemia in which he sugar dropped to the 50s. States she woke up in cold sweats during both episodes and had improvement after drinking juice. She denies any LOC. She does eat 3 small meals a day. Current medications include glargine 28 units daily, lispro 5 units at mealtime, Janument 50-1000 BID and Invokana 300 mg daily. Given pt has had several hypoglycemic episodes and eat small meals throughout the day, will decrease long acting glargine slightly and follow up.  > Decrease glargine to 27 units daily > lispro 5 units with meals > Janument 50-1000 BID > Invokana 300 mg daily

## 2022-07-15 NOTE — Progress Notes (Signed)
Subjective:   Patient ID: Ashley Long female   DOB: [birth year] 62 y.o.   MRN: [id number]  HPI: Ashley Long is a 62 y.o. female with a PMHx as listed below who presents for follow up of her resistant hypertension and diabetes. Please see problem based assessment and plan for further work up and management.   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   Dysphagia 11/19/2021   GERD (gastroesophageal reflux disease) 11/19/2021   Bunion 07/15/2020   Peripheral artery disease (HCC) 03/23/2020   Normocytic anemia 03/23/2020   Personal history of colonic polyps 11/09/2018   Colitis 05/03/2018   Cervical cancer screening 03/03/2018   Stage 3 chronic kidney disease (HCC) 03/03/2018   Perimenopausal 08/15/2016   Constipation 06/14/2015   Diabetes mellitus (HCC) 03/29/2014   Preventive measure 04/28/2011   Hyperlipidemia 06/02/2006   Resistant hypertension 06/02/2006     Current Outpatient Medications  Medication Sig Dispense Refill   acetaminophen (TYLENOL) 500 MG tablet Take 500 mg by mouth every 6 (six) hours as needed for moderate pain.     amLODipine-olmesartan (AZOR) 5-20 MG tablet Take 1 tablet by mouth daily. 180 tablet 0   ascorbic acid (VITAMIN C) 500 MG tablet Take 500 mg by mouth daily.     ASPIRIN LOW DOSE 81 MG tablet TAKE 1 Tablet BY MOUTH ONCE EVERY DAY 90 tablet 3   atorvastatin (LIPITOR) 40 MG tablet TAKE 1 Tablet BY MOUTH ONCE EVERY DAY 90 tablet 3   Blood Glucose Monitoring Suppl (WAVESENSE PRESTO) W/DEVICE KIT 1 each by Does not apply route 2 (two) times daily. 1 each 0   carvedilol (COREG) 25 MG tablet TAKE 1 Tablet  BY MOUTH TWICE DAILY 180 tablet 3   chlorthalidone (HYGROTON) 25 MG tablet Take 1 tablet (25 mg total) by mouth daily 90 tablet 2   cholecalciferol (VITAMIN D3) 25 MCG (1000 UNIT) tablet Take 1 tablet (1,000 Units total) by mouth 3 (three) times a week. 36 tablet 3   cloNIDine (CATAPRES) 0.2 MG  tablet Take 1 tablet (0.2 mg total) by mouth 2 (two) times daily for 7 days, THEN 1/2 tablet (0.1 mg total) 2 (two) times daily for 10 days. 24 tablet 0   glucose blood (WAVESENSE PRESTO) test strip Use to test blood sugar 3 times daily. diag code E11.9. Insulin dependent 100 each 11   Insulin Glargine (BASAGLAR KWIKPEN) 100 UNIT/ML Inject 27 Units into the skin at bedtime. 15 mL 5   insulin lispro (HUMALOG KWIKPEN) 100 UNIT/ML KwikPen INJECT 3 TO 8 UNITS UNDER THE SKIN 3 TIMES DAILY 15 mL 5   Insulin Pen Needle (PEN NEEDLES) 31G X 5 MM MISC 4 Doses/Fill by Does not apply route daily. 100 each 11   Insulin Syringe-Needle U-100 31G X 15/64" 0.3 ML MISC 1 each by Does not apply route 2 (two) times daily with a meal. 100 each 11   INVOKANA 300 MG TABS tablet TAKE 1 Tablet BY MOUTH ONCE EVERY DAY BEFORE BREAKFAST 30 tablet 5   Lancets 30G MISC 1 each by Does not apply route 2 (two) times daily. 100 each 12   magnesium oxide (MAG-OX) 400 MG tablet Take 400 mg by mouth daily.     omeprazole (PRILOSEC) 40 MG capsule Take 1 capsule (40 mg total) by mouth daily. 90 capsule 3   Polyethyl Glycol-Propyl Glycol (SYSTANE OP) Place 1 drop into both eyes daily as needed (  irritation).     polyethylene glycol (MIRALAX / GLYCOLAX) 17 g packet Take 17 g by mouth daily as needed for moderate constipation.     sitaGLIPtin-metformin (JANUMET) 50-1000 MG tablet Take 1 tablet by mouth 2 (two) times daily with a meal. 60 tablet 11   valACYclovir (VALTREX) 500 MG tablet Take 500 mg by mouth 2 (two) times daily as needed (fever blisters).     No current facility-administered medications for this visit.     Review of Systems: Pertinent ROS listed in the A&P. Otherwise negative.  Objective:   Physical Exam: Vitals:   07/15/22 1051  BP: 130/88  Pulse: 73  SpO2: 100%  Weight: 222 lb 12.8 oz (101.1 kg)   Physical Exam Constitutional:      Appearance: Normal appearance.  HENT:     Head: Normocephalic and  atraumatic.  Cardiovascular:     Rate and Rhythm: Normal rate and regular rhythm.  Pulmonary:     Effort: Pulmonary effort is normal.     Breath sounds: Normal breath sounds.  Neurological:     General: No focal deficit present.     Mental Status: Ashley Long is alert and oriented to person, place, and time.  Psychiatric:        Mood and Affect: Mood normal.        Behavior: Behavior normal.      Assessment & Plan:   Diabetes mellitus (HCC) Pt presents for follow up regarding her diabetes. A1c on 04/08 was 8.8 with average blood glucose in the 150s-160s. Today average blood glucose reading is 134. All pre-prandial readings have remained below 250. Glucose log reveals patient has had 2 episodes of hypoglycemia in which he sugar dropped to the 50s. States Ashley Long woke up in cold sweats during both episodes and had improvement after drinking juice. Ashley Long denies any LOC. Ashley Long does eat 3 small meals a day. Current medications include glargine 28 units daily, lispro 5 units at mealtime, Janument 50-1000 BID and Invokana 300 mg daily. Given pt has had several hypoglycemic episodes and eat small meals throughout the day, will decrease long acting glargine slightly and follow up.  > Decrease glargine to 27 units daily > lispro 5 units with meals > Janument 50-1000 BID > Invokana 300 mg daily   Resistant hypertension Pt here for follow up of resistant HTN. BP today is 130/88. Ashley Long does check her pressure at home x3 daily. Ashley Long is usually normotensive but does have a low reading about every other day. Lowest systolic recorded at home was 69. Ashley Long is asymptomatic during those time denying any dizziness, lightheadedness. Current medications include amlodipine-olmesartan 5-20, carvedilol 25 mg, chlorthalidone 25 mg, and clonidine 0.3. Given BP seems well controlled, with begin tapering down clonidine and follow up in 2 weeks. If pressure not well controlled at that time, will consider increasing dosage of  amlodipine-olmesartan.   > clonidine 0.2mg  twice a day for 1 week.then 0.1mg  twice a day for 10 days > amlodipine-olmesartan 5-20 > carvedilol 25 mg > chlorthalidone 25 mg

## 2022-07-15 NOTE — Progress Notes (Signed)
Attestation for Student Documentation:  I personally was present and performed or re-performed the history, physical exam and medical decision-making activities of this service and have verified that the service and findings are accurately documented in the student's note.  Merrilyn Puma, MD 07/15/2022, 3:37 PM

## 2022-07-16 NOTE — Progress Notes (Signed)
Internal Medicine Clinic Attending  Case discussed with Dr. Jinwala  At the time of the visit.  We reviewed the resident's history and exam and pertinent patient test results.  I agree with the assessment, diagnosis, and plan of care documented in the resident's note.  

## 2022-07-16 NOTE — Addendum Note (Signed)
Addended by: Derrek Monaco on: 07/16/2022 08:28 AM   Modules accepted: Level of Service

## 2022-07-29 ENCOUNTER — Ambulatory Visit (INDEPENDENT_AMBULATORY_CARE_PROVIDER_SITE_OTHER): Payer: Self-pay | Admitting: Student

## 2022-07-29 ENCOUNTER — Other Ambulatory Visit: Payer: Self-pay

## 2022-07-29 ENCOUNTER — Encounter: Payer: Self-pay | Admitting: Student

## 2022-07-29 VITALS — BP 120/84 | HR 79 | Temp 98.2°F | Ht 67.0 in | Wt 223.3 lb

## 2022-07-29 DIAGNOSIS — Z794 Long term (current) use of insulin: Secondary | ICD-10-CM

## 2022-07-29 DIAGNOSIS — E119 Type 2 diabetes mellitus without complications: Secondary | ICD-10-CM

## 2022-07-29 DIAGNOSIS — I1A0 Resistant hypertension: Secondary | ICD-10-CM

## 2022-07-29 MED ORDER — BASAGLAR KWIKPEN 100 UNIT/ML ~~LOC~~ SOPN: 26.0000 [IU] | PEN_INJECTOR | Freq: Every day | SUBCUTANEOUS | 5 refills | Status: DC

## 2022-07-29 MED ORDER — INSULIN LISPRO (1 UNIT DIAL) 100 UNIT/ML (KWIKPEN): PEN_INJECTOR | SUBCUTANEOUS | 5 refills | Status: DC

## 2022-07-29 NOTE — Assessment & Plan Note (Addendum)
Presents for f/u on resistant HTN. On amlodipine-olmesartan 5-20 mg daily, carvedilol 25 mg BID, chlorthalidone 25 mg daily. In process of tapering off clonidine and currently on 0.1 mg BID x 10 days. Prior OV noted hypotensive readings but asymptomatic. BP today 120/84 and home readings mostly 120-130/80 with occasional SBP 110s. Plan is to taper off clonidine and adjust her other antihypertensives.   Plan -continue amlodipine-olmesartan, carvedilol and chlorthalidone  -finish taper off clonidine at the end of this week -reassess BP in 1-2 weeks to decide if need to increase amlodipine-olmesartan

## 2022-07-29 NOTE — Patient Instructions (Signed)
Thank you, [name] for allowing [place] to provide your care today. Today we discussed your blood pressure and diabetes.  Diabetes -Decrease Lantus to 26 units one time a day -Increase your Humalog to 6 units before breakfast and lunch and keep 4 units before dinner -Continue checking blood sugar and let us know if you have any low sugar.   Blood Pressure -Plan to finish clonidine taper on Friday, continue your amlodipine-olmesartan, carvedilol and chlorthalidone    -Continue to check blood pressure at home -We can recheck your blood pressure in about 2 weeks  I have ordered the following medication/changed the following medications:   Stop the following medications: Medications Discontinued During This Encounter  Medication Reason   insulin lispro (HUMALOG KWIKPEN) 100 UNIT/ML KwikPen Reorder   Insulin Glargine (BASAGLAR KWIKPEN) 100 UNIT/ML Reorder     Start the following medications: Meds ordered this encounter  Medications   Insulin Glargine (BASAGLAR KWIKPEN) 100 UNIT/ML    Sig: Inject 26 Units into the skin at bedtime.    Dispense:  15 mL    Refill:  5    IM program   insulin lispro (HUMALOG KWIKPEN) 100 UNIT/ML KwikPen    Sig: INJECT 6 UNITS UNDER THE SKIN BEFORE BREAKFAST AND LUNCH AND 4 UNITS BEFORE DINNER.    Dispense:  15 mL    Refill:  5     Follow up:  2 weeks     Should you have any questions or concerns please call the internal medicine clinic at [phone].    Rana Snare, D.O. [facility]

## 2022-07-29 NOTE — Assessment & Plan Note (Addendum)
Last A1c 8.8 in April. Last OV stated having hypoglycemia and long acting insulin decreased. Taking glargine 27 units daily, humalog 5 units before breakfast and lunch and 4 units before dinner, Janumet 50-1000 mg BID and Invokana 300 mg daily. Brought glucose log today which showed preprandial readings in 120-200. Noted one episode of hypoglycemia of 58 in the late evening. Will decrease long acting and increase her short acting insulin for breakfast and lunch.   Plan -decrease glargine to 26 units daily -increase Humalog to 6 units before breakfast and lunch, keep 4 units before dinner -continue Janumet and Invokana -f/u in 2 weeks to reassess

## 2022-07-29 NOTE — Progress Notes (Signed)
CC: follow up for T2DM and HTN  HPI:  Ashley Long is a 62 y.o. female living with a history stated below and presents today for follow up for T2DM and HTN. Please see problem based assessment and plan for additional details.  Past Medical History:  Diagnosis Date   AKI (acute kidney injury) (HCC) 07/17/2017   Chronic kidney disease (CKD), stage III (moderate) (HCC)    DIABETES MELLITUS, TYPE II 06/02/2006   Last HBA1C 8.7. , check 3 times daily   Epidermal inclusion cyst 03/23/2020   Hematoma of right breast 04/10/2021   Hydronephrosis    sees Dr Malen Gauze at [place] [facility] ,   HYPERLIPIDEMIA 06/02/2006   Qualifier: Diagnosis of  By: Noel Gerold MD, Christiane Ha     HYPERTENSION 06/02/2006   Filed Vitals:   01/21/11 1134  BP: 162/98  Pulse: 80  Temp: 97.4 F (36.3 C)  TempSrc: Oral  Resp: 20  Height: 5\' 8"  (1.727 m)  Weight: 261 lb (118.389 kg)      Injury    Persistent proteinuria 01/21/2018   Stage 2 chronic kidney disease 12/02/2017   now stage 3    Suprapubic pain 05/17/2020   Wound of right leg    right abover ankle, patient report sshe sustained a week ago from today  (11-08-2018) after hitting her leg on an object , reports wound is scabbed over and her pcp checked it out at [facility]  some days ago and was not converned, patient denies redness or pain at sit eof wound     Current Outpatient Medications on File Prior to Visit  Medication Sig Dispense Refill   acetaminophen (TYLENOL) 500 MG tablet Take 500 mg by mouth every 6 (six) hours as needed for moderate pain.     amLODipine-olmesartan (AZOR) 5-20 MG tablet Take 1 tablet by mouth daily. 180 tablet 0   ascorbic acid (VITAMIN C) 500 MG tablet Take 500 mg by mouth daily.     ASPIRIN LOW DOSE 81 MG tablet TAKE 1 Tablet BY MOUTH ONCE EVERY DAY 90 tablet 3   atorvastatin (LIPITOR) 40 MG tablet TAKE 1 Tablet BY MOUTH ONCE EVERY DAY 90 tablet 3   Blood Glucose Monitoring Suppl (WAVESENSE PRESTO) W/DEVICE KIT 1 each by Does not apply route 2  (two) times daily. 1 each 0   carvedilol (COREG) 25 MG tablet TAKE 1 Tablet  BY MOUTH TWICE DAILY 180 tablet 3   chlorthalidone (HYGROTON) 25 MG tablet Take 1 tablet (25 mg total) by mouth daily 90 tablet 2   cholecalciferol (VITAMIN D3) 25 MCG (1000 UNIT) tablet Take 1 tablet (1,000 Units total) by mouth 3 (three) times a week. 36 tablet 3   cloNIDine (CATAPRES) 0.2 MG tablet Take 1 tablet (0.2 mg total) by mouth 2 (two) times daily for 7 days, THEN 1/2 tablet (0.1 mg total) 2 (two) times daily for 10 days. 24 tablet 0   glucose blood (WAVESENSE PRESTO) test strip Use to test blood sugar 3 times daily. diag code E11.9. Insulin dependent 100 each 11   Insulin Pen Needle (PEN NEEDLES) 31G X 5 MM MISC 4 Doses/Fill by Does not apply route daily. 100 each 11   Insulin Syringe-Needle U-100 31G X 15/64" 0.3 ML MISC 1 each by Does not apply route 2 (two) times daily with a meal. 100 each 11   INVOKANA 300 MG TABS tablet TAKE 1 Tablet BY MOUTH ONCE EVERY DAY BEFORE BREAKFAST 30 tablet 5   Lancets 30G MISC  1 each by Does not apply route 2 (two) times daily. 100 each 12   magnesium oxide (MAG-OX) 400 MG tablet Take 400 mg by mouth daily.     omeprazole (PRILOSEC) 40 MG capsule Take 1 capsule (40 mg total) by mouth daily. 90 capsule 3   Polyethyl Glycol-Propyl Glycol (SYSTANE OP) Place 1 drop into both eyes daily as needed (irritation).     polyethylene glycol (MIRALAX / GLYCOLAX) 17 g packet Take 17 g by mouth daily as needed for moderate constipation.     sitaGLIPtin-metformin (JANUMET) 50-1000 MG tablet Take 1 tablet by mouth 2 (two) times daily with a meal. 60 tablet 11   valACYclovir (VALTREX) 500 MG tablet Take 500 mg by mouth 2 (two) times daily as needed (fever blisters).     No current facility-administered medications on file prior to visit.   Review of Systems: ROS negative except for what is noted on the assessment and plan.  Vitals:   07/29/22 1032  BP: 120/84  Pulse: 79  Temp: 98.2 F  (36.8 C)  TempSrc: Oral  SpO2: 100%  Weight: 223 lb 4.8 oz (101.3 kg)  Height: 5\' 7"  (1.702 m)   Physical Exam: Constitutional: well-appearing female sitting in chair comfortably, in no acute distress Cardiovascular: regular rate Pulmonary/Chest: normal work of breathing on room air MSK: normal bulk and tone Neurological: alert & oriented x 3 Skin: warm and dry Psych: pleasant mood  Assessment & Plan:   Diabetes mellitus (HCC) Last A1c 8.8 in April. Last OV stated having hypoglycemia and long acting insulin decreased. Taking glargine 27 units daily, humalog 5 units before breakfast and lunch and 4 units before dinner, Janumet 50-1000 mg BID and Invokana 300 mg daily. Brought glucose log today which showed preprandial readings in 120-200. Noted one episode of hypoglycemia of 58 in the late evening. Will decrease long acting and increase her short acting insulin for breakfast and lunch.   Plan -decrease glargine to 26 units daily -increase Humalog to 6 units before breakfast and lunch, keep 4 units before dinner -continue Janumet and Invokana -f/u in 2 weeks to reassess  Resistant hypertension Presents for f/u on resistant HTN. On amlodipine-olmesartan 5-20 mg daily, carvedilol 25 mg BID, chlorthalidone 25 mg daily. In process of tapering off clonidine and currently on 0.1 mg BID x 10 days. Prior OV noted hypotensive readings but asymptomatic. BP today 120/84 and home readings mostly 120-130/80 with occasional SBP 110s. Plan is to taper off clonidine and adjust her other antihypertensives.   Plan -continue amlodipine-olmesartan, carvedilol and chlorthalidone  -finish taper off clonidine at the end of this week -reassess BP in 1-2 weeks to decide if need to increase amlodipine-olmesartan   Patient discussed with Dr. Halina Andreas, D.O. [facility], PGY-1 Phone: [phone] Date 07/29/2022 Time 1:34 PM

## 2022-07-30 NOTE — Progress Notes (Signed)
Internal Medicine Clinic Attending  Case discussed with Dr. Sherrilee Gilles  At the time of the visit.  We reviewed the resident's history and exam and pertinent patient test results.  I agree with the assessment, diagnosis, and plan of care documented in the resident's note.    Patient is successfully tapering off clonidine. I anticipate she may need a higher dose of the amlodipine and/or olmesartan once she is fully off the clonidine. At next visit, repeat blood pressure & consider up-titrating. She should be fully off the clonidine by then.

## 2022-08-05 ENCOUNTER — Telehealth: Payer: Self-pay | Admitting: *Deleted

## 2022-08-05 ENCOUNTER — Other Ambulatory Visit: Payer: Self-pay

## 2022-08-05 ENCOUNTER — Other Ambulatory Visit (HOSPITAL_COMMUNITY): Payer: Self-pay

## 2022-08-05 NOTE — Telephone Encounter (Signed)
Call from patient unable to get Amlodipine-Olmesartan from [facility]. Patient was informed that the prescription had been sent to [facility] and should arrive in the mail.  Patient to call [facility] to check on delivery.

## 2022-08-07 ENCOUNTER — Other Ambulatory Visit (HOSPITAL_COMMUNITY): Payer: Self-pay

## 2022-08-13 ENCOUNTER — Other Ambulatory Visit (HOSPITAL_COMMUNITY): Payer: Self-pay

## 2022-08-13 ENCOUNTER — Other Ambulatory Visit: Payer: Self-pay

## 2022-08-13 ENCOUNTER — Encounter: Payer: Self-pay | Admitting: Student

## 2022-08-13 ENCOUNTER — Telehealth: Payer: Self-pay

## 2022-08-13 ENCOUNTER — Ambulatory Visit (INDEPENDENT_AMBULATORY_CARE_PROVIDER_SITE_OTHER): Payer: Self-pay | Admitting: Student

## 2022-08-13 VITALS — BP 136/84 | HR 80 | Temp 98.6°F | Ht 68.0 in | Wt 221.3 lb

## 2022-08-13 DIAGNOSIS — Z6830 Body mass index (BMI) 30.0-30.9, adult: Secondary | ICD-10-CM

## 2022-08-13 DIAGNOSIS — I10 Essential (primary) hypertension: Secondary | ICD-10-CM

## 2022-08-13 DIAGNOSIS — N1832 Chronic kidney disease, stage 3b: Secondary | ICD-10-CM

## 2022-08-13 DIAGNOSIS — I1A0 Resistant hypertension: Secondary | ICD-10-CM

## 2022-08-13 DIAGNOSIS — I129 Hypertensive chronic kidney disease with stage 1 through stage 4 chronic kidney disease, or unspecified chronic kidney disease: Secondary | ICD-10-CM

## 2022-08-13 DIAGNOSIS — E669 Obesity, unspecified: Secondary | ICD-10-CM | POA: Insufficient documentation

## 2022-08-13 MED ORDER — AMLODIPINE-OLMESARTAN 10-20 MG PO TABS
1.0000 | ORAL_TABLET | Freq: Every day | ORAL | 0 refills | Status: DC
Start: 2022-08-13 — End: 2022-08-31
  Filled 2022-08-13 – 2022-08-14 (×2): qty 30, 30d supply, fill #0

## 2022-08-13 MED ORDER — AMLODIPINE-OLMESARTAN 10-20 MG PO TABS
1.0000 | ORAL_TABLET | Freq: Every day | ORAL | 0 refills | Status: DC
Start: 2022-08-13 — End: 2022-08-13

## 2022-08-13 MED ORDER — AMLODIPINE-OLMESARTAN 5-20 MG PO TABS
1.0000 | ORAL_TABLET | Freq: Every day | ORAL | 3 refills | Status: DC
Start: 2022-08-13 — End: 2022-08-13

## 2022-08-13 NOTE — Telephone Encounter (Signed)
Patient called she is requesting a work excuse note from today's visit, patient is requesting for the letter to be mailed to her home address.

## 2022-08-13 NOTE — Patient Instructions (Addendum)
Thank you, [name] for allowing [place] to provide your care today. Today we discussed your blood pressure.      New medications: - change the amlodipine-olmesartan to the 10-20 mg dose daily  Check your blood pressure once a day after your medications in the morning. Please see the blood pressure tips below  I have ordered the following labs for you:  Lab Orders         Microalbumin / Creatinine Urine Ratio         TSH       I will call if any are abnormal. All of your labs can be accessed through "My Chart".   My Chart Access: [email or web address]  Please follow-up in: 4 weeks for blood pressure check and to request 90 day supply of medications if they are working for you. Bring your blood pressure log    We look forward to seeing you next time. Please call our clinic at [phone] if you have any questions or concerns. The best time to call is Monday-Friday from 9am-4pm, but there is someone available 24/7. If after hours or the weekend, call the main hospital number and ask for the Internal Medicine Resident On-Call. If you need medication refills, please notify your pharmacy one week in advance and they will send [place] a request.   Thank you for letting us take part in your care. Wishing you the best!  Morene Crocker, MD 08/13/2022, 11:20 AM [facility] Internal Medicine Resident, PGY-1  Blood pressure tips   It is best to check your BP 1-2 hours after taking your medications to see the medications effectiveness on your BP.    Here are some tips that our clinical pharmacists share for home BP monitoring:          Rest 10 minutes before taking your blood pressure.          Don't smoke or drink caffeinated beverages for at least 30 minutes before.          Take your blood pressure before (not after) you eat.          Sit comfortably with your back supported and both feet on the floor (don't cross your legs).           Elevate your arm to heart level on a table or a desk.          Use the proper sized cuff. It should fit smoothly and snugly around your bare upper arm. There should be enough room to slip a fingertip under the cuff. The bottom edge of the cuff should be 1 inch above the crease of the elbow.

## 2022-08-13 NOTE — Progress Notes (Signed)
Subjective:  CC: Blood pressure follow up  HPI:  Ashley Long is a 62 y.o. female with a past medical history stated below and presents today for hypertension. Please see problem based assessment and plan for additional details.  Past Medical History:  Diagnosis Date   AKI (acute kidney injury) (HCC) 07/17/2017   Chronic kidney disease (CKD), stage III (moderate) (HCC)    DIABETES MELLITUS, TYPE II 06/02/2006   Last HBA1C 8.7. , check 3 times daily   Epidermal inclusion cyst 03/23/2020   Hematoma of right breast 04/10/2021   Hydronephrosis    sees Dr Malen Gauze at [facility] ,   HYPERLIPIDEMIA 06/02/2006   Qualifier: Diagnosis of  By: Noel Gerold MD, Christiane Ha     HYPERTENSION 06/02/2006   Filed Vitals:   01/21/11 1134  BP: 162/98  Pulse: 80  Temp: 97.4 F (36.3 C)  TempSrc: Oral  Resp: 20  Height: 5\' 8"  (1.727 m)  Weight: 261 lb (118.389 kg)      Injury    Persistent proteinuria 01/21/2018   Stage 2 chronic kidney disease 12/02/2017   now stage 3    Suprapubic pain 05/17/2020   Wound of right leg    right abover ankle, patient report sshe sustained a week ago from today  (11-08-2018) after hitting her leg on an object , reports wound is scabbed over and her pcp checked it out at [facility]  some days ago and was not converned, patient denies redness or pain at sit eof wound     Current Outpatient Medications on File Prior to Visit  Medication Sig Dispense Refill   acetaminophen (TYLENOL) 500 MG tablet Take 500 mg by mouth every 6 (six) hours as needed for moderate pain.     ascorbic acid (VITAMIN C) 500 MG tablet Take 500 mg by mouth daily.     ASPIRIN LOW DOSE 81 MG tablet TAKE 1 Tablet BY MOUTH ONCE EVERY DAY 90 tablet 3   atorvastatin (LIPITOR) 40 MG tablet TAKE 1 Tablet BY MOUTH ONCE EVERY DAY 90 tablet 3   Blood Glucose Monitoring Suppl (WAVESENSE PRESTO) W/DEVICE KIT 1 each by Does not apply route 2 (two) times daily. 1 each 0   carvedilol (COREG) 25 MG tablet TAKE 1 Tablet  BY  MOUTH TWICE DAILY 180 tablet 3   chlorthalidone (HYGROTON) 25 MG tablet Take 1 tablet (25 mg total) by mouth daily 90 tablet 2   cholecalciferol (VITAMIN D3) 25 MCG (1000 UNIT) tablet Take 1 tablet (1,000 Units total) by mouth 3 (three) times a week. 36 tablet 3   cloNIDine (CATAPRES) 0.2 MG tablet Take 1 tablet (0.2 mg total) by mouth 2 (two) times daily for 7 days, THEN 1/2 tablet (0.1 mg total) 2 (two) times daily for 10 days. 24 tablet 0   glucose blood (WAVESENSE PRESTO) test strip Use to test blood sugar 3 times daily. diag code E11.9. Insulin dependent 100 each 11   Insulin Glargine (BASAGLAR KWIKPEN) 100 UNIT/ML Inject 26 Units into the skin at bedtime. 15 mL 5   insulin lispro (HUMALOG KWIKPEN) 100 UNIT/ML KwikPen INJECT 6 UNITS UNDER THE SKIN BEFORE BREAKFAST AND LUNCH AND 4 UNITS BEFORE DINNER. 15 mL 5   Insulin Pen Needle (PEN NEEDLES) 31G X 5 MM MISC 4 Doses/Fill by Does not apply route daily. 100 each 11   Insulin Syringe-Needle U-100 31G X 15/64" 0.3 ML MISC 1 each by Does not apply route 2 (two) times daily with a meal. 100  each 11   INVOKANA 300 MG TABS tablet TAKE 1 Tablet BY MOUTH ONCE EVERY DAY BEFORE BREAKFAST 30 tablet 5   Lancets 30G MISC 1 each by Does not apply route 2 (two) times daily. 100 each 12   magnesium oxide (MAG-OX) 400 MG tablet Take 400 mg by mouth daily.     omeprazole (PRILOSEC) 40 MG capsule Take 1 capsule (40 mg total) by mouth daily. 90 capsule 3   Polyethyl Glycol-Propyl Glycol (SYSTANE OP) Place 1 drop into both eyes daily as needed (irritation).     polyethylene glycol (MIRALAX / GLYCOLAX) 17 g packet Take 17 g by mouth daily as needed for moderate constipation.     sitaGLIPtin-metformin (JANUMET) 50-1000 MG tablet Take 1 tablet by mouth 2 (two) times daily with a meal. 60 tablet 11   valACYclovir (VALTREX) 500 MG tablet Take 500 mg by mouth 2 (two) times daily as needed (fever blisters).     No current facility-administered medications on file prior to  visit.    Family History  Problem Relation Age of Onset   Diabetes Mother    Other Neg Hx    Breast cancer Neg Hx     Social History   Socioeconomic History   Marital status: Single    Spouse name: Not on file   Number of children: 1   Years of education: 12   Highest education level: High school graduate  Occupational History   Occupation: Lobbyist: [employer]  Tobacco Use   Smoking status: Former    Types: Cigarettes    Quit date: 10/15/1982    Years since quitting: 39.8   Smokeless tobacco: Never  Vaping Use   Vaping Use: Never used  Substance and Sexual Activity   Alcohol use: No    Alcohol/week: 0.0 standard drinks of alcohol   Drug use: No   Sexual activity: Not Currently  Other Topics Concern   Not on file  Social History Narrative   Financial assistance approved for 100% discount at [facility] and has [facility] card per [employer]   02/12/10         Social Determinants of Health   Financial Resource Strain: Low Risk  (03/25/2022)   Overall Financial Resource Strain (CARDIA)    Difficulty of Paying Living Expenses: Not hard at all  Food Insecurity: No Food Insecurity (03/25/2022)   Hunger Vital Sign    Worried About Running Out of Food in the Last Year: Never true    Ran Out of Food in the Last Year: Never true  Transportation Needs: No Transportation Needs (03/25/2022)   PRAPARE - Administrator, Civil Service (Medical): No    Lack of Transportation (Non-Medical): No  Physical Activity: Not on file  Stress: Not on file  Social Connections: Moderately Isolated (03/25/2022)   Social Connection and Isolation Panel [NHANES]    Frequency of Communication with Friends and Family: Twice a week    Frequency of Social Gatherings with Friends and Family: Once a week    Attends Religious Services: 1 to 4 times per year    Active Member of [employer] or Organizations: No    Attends Banker Meetings: Never    Marital Status: Never married   Intimate Partner Violence: Not At Risk (03/25/2022)   Humiliation, Afraid, Rape, and Kick questionnaire    Fear of Current or Ex-Partner: No    Emotionally Abused: No    Physically Abused: No  Sexually Abused: No    Review of Systems: ROS negative except for what is noted on the assessment and plan.  Objective:   Vitals:   08/13/22 1040  BP: 136/84  Pulse: 80  Temp: 98.6 F (37 C)  TempSrc: Oral  SpO2: 100%  Weight: 221 lb 4.8 oz (100.4 kg)  Height: 5\' 8"  (1.727 m)    Physical Exam: Constitutional: well-appearing woman sitting in chair, in no acute distress HENT: normocephalic atraumatic, mucous membranes moist Eyes: conjunctiva non-erythematous Neck: supple Cardiovascular: regular rate and rhythm, no m/r/g Pulmonary/Chest: normal work of breathing on room air, lungs clear to auscultation bilaterally Abdominal: soft, non-tender, non-distended MSK: normal bulk and tone Neurological: alert & oriented x 3, 5/5 strength in bilateral upper and lower extremities, normal gait Skin: warm and dry Psych: Pleasant mood and affect    Assessment & Plan:   Resistant hypertension Unable to get amlodipine-olmesartan  5-20 mg from [facility] as they don't have it. [facility] was able to fill it after last visit, so she has been adherent to it, chlorthalidone, and BID 25 mg carvedilol. She has successfully tapered off clonidine on 5/17. She brought in her blood pressure cuff from home and it has a similar read to today's BP in the clinic (136/74). I reviewed her log, and SBP ranges from 120-140/70-80. Patient has not undergone a sleep study, but denies snoring at this time.   Patient denies chest pain, shortness of breath, palpitations. Has been exerscing by walking a lot more. Has noticed her weight ~4 lb since the last time she was here. She has been cutting back on her snacks and drinking more water. Using the Airfryer and is taking her lunch to work, which has helped.    Will plan to increase Amlodipine to 10mg  daily and keep the rest of the medications the same. -Start  amlodipine-olmesartan 10-20 mg -Continue carvedilol and chlorthalidone   Stage 3 chronic kidney disease (HCC) - Urine microalbumin creatitine ratio  Obesity (BMI 30-39.9) Discussed lifestyle modifications with patient today and congratulated on recent changes. Would consider adding a GLP1 agonist (Rybelsus at IM program) for diabetes and obesity.    Return in about 4 weeks (around 09/10/2022).   Patient discussed with Dr. Olene Floss, MD [facility] Internal Medicine Program - PGY-1 08/13/2022, 4:49 PM

## 2022-08-13 NOTE — Assessment & Plan Note (Signed)
-   Urine microalbumin creatitine ratio

## 2022-08-13 NOTE — Assessment & Plan Note (Signed)
Unable to get amlodipine-olmesartan  5-20 mg from [facility] as they don't have it. [facility] was able to fill it after last visit, so she has been adherent to it, chlorthalidone, and BID 25 mg carvedilol. She has successfully tapered off clonidine on 5/17. She brought in her blood pressure cuff from home and it has a similar read to today's BP in the clinic (136/74). I reviewed her log, and SBP ranges from 120-140/70-80. Patient has not undergone a sleep study, but denies snoring at this time.   Patient denies chest pain, shortness of breath, palpitations. Has been exerscing by walking a lot more. Has noticed her weight ~4 lb since the last time she was here. She has been cutting back on her snacks and drinking more water. Using the Airfryer and is taking her lunch to work, which has helped.   Will plan to increase Amlodipine to 10mg  daily and keep the rest of the medications the same. -Start  amlodipine-olmesartan 10-20 mg -Continue carvedilol and chlorthalidone

## 2022-08-13 NOTE — Assessment & Plan Note (Addendum)
Discussed lifestyle modifications with patient today and congratulated on recent changes. Would consider adding a GLP1 agonist (Rybelsus at IM program) for diabetes and obesity.

## 2022-08-14 ENCOUNTER — Other Ambulatory Visit (HOSPITAL_COMMUNITY): Payer: Self-pay

## 2022-08-14 ENCOUNTER — Encounter: Payer: Self-pay | Admitting: Student

## 2022-08-14 ENCOUNTER — Other Ambulatory Visit: Payer: Self-pay

## 2022-08-14 LAB — MICROALBUMIN / CREATININE URINE RATIO
Creatinine, Urine: 79.7 mg/dL
Microalb/Creat Ratio: 69 mg/g creat — ABNORMAL HIGH (ref 0–29)
Microalbumin, Urine: 55 ug/mL

## 2022-08-14 LAB — TSH: TSH: 1.06 u[IU]/mL (ref 0.450–4.500)

## 2022-08-14 NOTE — Progress Notes (Signed)
Normal result. Patient called and aware.

## 2022-08-14 NOTE — Progress Notes (Signed)
Moderately increased microalbuminuria compared to a year ago. Suspect this is likely secondary to difficulties with uncontrolled hypertension. Patient is currently on ARB and SGLT2i. Will continue working on optimizing hypertension treatment.

## 2022-08-18 NOTE — Progress Notes (Signed)
Internal Medicine Clinic Attending  Case discussed with Dr. Gomez-Caraballo  At the time of the visit.  We reviewed the resident's history and exam and pertinent patient test results.  I agree with the assessment, diagnosis, and plan of care documented in the resident's note.  

## 2022-08-31 ENCOUNTER — Other Ambulatory Visit (HOSPITAL_COMMUNITY): Payer: Self-pay

## 2022-08-31 ENCOUNTER — Other Ambulatory Visit: Payer: Self-pay | Admitting: Student

## 2022-08-31 DIAGNOSIS — I1A0 Resistant hypertension: Secondary | ICD-10-CM

## 2022-09-01 ENCOUNTER — Other Ambulatory Visit (HOSPITAL_COMMUNITY): Payer: Self-pay

## 2022-09-01 MED ORDER — AMLODIPINE-OLMESARTAN 10-20 MG PO TABS
1.0000 | ORAL_TABLET | Freq: Every day | ORAL | 11 refills | Status: DC
Start: 2022-09-01 — End: 2022-09-11
  Filled 2022-09-01: qty 30, 30d supply, fill #0

## 2022-09-02 ENCOUNTER — Other Ambulatory Visit (HOSPITAL_COMMUNITY): Payer: Self-pay

## 2022-09-11 ENCOUNTER — Other Ambulatory Visit (HOSPITAL_COMMUNITY): Payer: Self-pay

## 2022-09-11 ENCOUNTER — Encounter: Payer: Self-pay | Admitting: Internal Medicine

## 2022-09-11 ENCOUNTER — Ambulatory Visit (INDEPENDENT_AMBULATORY_CARE_PROVIDER_SITE_OTHER): Payer: Self-pay | Admitting: Internal Medicine

## 2022-09-11 VITALS — BP 120/78 | HR 74 | Ht 68.0 in | Wt 220.1 lb

## 2022-09-11 DIAGNOSIS — Z7984 Long term (current) use of oral hypoglycemic drugs: Secondary | ICD-10-CM

## 2022-09-11 DIAGNOSIS — Z794 Long term (current) use of insulin: Secondary | ICD-10-CM

## 2022-09-11 DIAGNOSIS — Z87891 Personal history of nicotine dependence: Secondary | ICD-10-CM

## 2022-09-11 DIAGNOSIS — N1832 Chronic kidney disease, stage 3b: Secondary | ICD-10-CM

## 2022-09-11 DIAGNOSIS — K219 Gastro-esophageal reflux disease without esophagitis: Secondary | ICD-10-CM

## 2022-09-11 DIAGNOSIS — E1142 Type 2 diabetes mellitus with diabetic polyneuropathy: Secondary | ICD-10-CM

## 2022-09-11 DIAGNOSIS — E1121 Type 2 diabetes mellitus with diabetic nephropathy: Secondary | ICD-10-CM

## 2022-09-11 DIAGNOSIS — I1A0 Resistant hypertension: Secondary | ICD-10-CM

## 2022-09-11 DIAGNOSIS — E1122 Type 2 diabetes mellitus with diabetic chronic kidney disease: Secondary | ICD-10-CM

## 2022-09-11 DIAGNOSIS — Z1231 Encounter for screening mammogram for malignant neoplasm of breast: Secondary | ICD-10-CM

## 2022-09-11 DIAGNOSIS — N1831 Chronic kidney disease, stage 3a: Secondary | ICD-10-CM

## 2022-09-11 LAB — POCT GLYCOSYLATED HEMOGLOBIN (HGB A1C): Hemoglobin A1C: 7.7 % — AB (ref 4.0–5.6)

## 2022-09-11 MED ORDER — PANTOPRAZOLE SODIUM 40 MG PO TBEC
40.0000 mg | DELAYED_RELEASE_TABLET | Freq: Two times a day (BID) | ORAL | 11 refills | Status: AC
Start: 2022-09-11 — End: ?
  Filled 2022-09-11: qty 60, 30d supply, fill #0
  Filled 2022-10-05: qty 60, 30d supply, fill #1
  Filled 2022-11-03: qty 60, 30d supply, fill #2
  Filled 2022-12-02: qty 60, 30d supply, fill #3
  Filled 2022-12-31: qty 60, 30d supply, fill #4
  Filled 2023-01-31: qty 60, 30d supply, fill #5
  Filled 2023-03-03: qty 60, 30d supply, fill #6
  Filled 2023-03-31: qty 60, 30d supply, fill #7
  Filled 2023-04-29: qty 60, 30d supply, fill #8
  Filled 2023-05-28: qty 60, 30d supply, fill #9
  Filled 2023-06-28: qty 60, 30d supply, fill #10
  Filled 2023-07-26: qty 60, 30d supply, fill #11

## 2022-09-11 MED ORDER — AMLODIPINE-OLMESARTAN 10-20 MG PO TABS
1.0000 | ORAL_TABLET | Freq: Every day | ORAL | 3 refills | Status: AC
Start: 2022-09-11 — End: 2023-09-11
  Filled 2022-09-11: qty 30, 30d supply, fill #0
  Filled 2022-10-05: qty 30, 30d supply, fill #1
  Filled 2022-11-03: qty 30, 30d supply, fill #2
  Filled 2022-12-02: qty 30, 30d supply, fill #3
  Filled 2022-12-31: qty 30, 30d supply, fill #4
  Filled 2023-01-31: qty 30, 30d supply, fill #5
  Filled 2023-03-03: qty 30, 30d supply, fill #6
  Filled 2023-03-31: qty 30, 30d supply, fill #7
  Filled 2023-04-29: qty 30, 30d supply, fill #8
  Filled 2023-05-28: qty 30, 30d supply, fill #9
  Filled 2023-06-28: qty 30, 30d supply, fill #10
  Filled 2023-07-26: qty 30, 30d supply, fill #11

## 2022-09-11 NOTE — Assessment & Plan Note (Signed)
A1c improved from 8.8 to 7.7%. Glucometer reviewed and fasting blood sugars consistently <140. She did have a few episodes of blood sugars in 300s at dinner and she recalls specific meals that lead to this.   P: Glargine 26 units Humalog 6 units B L and 4 units D Continue janumet and invokana Repeat A1c in September

## 2022-09-11 NOTE — Assessment & Plan Note (Signed)
Stopped clonidine 5/17, removed from med list. She endorses adherence with amlodipine-olmesartan 10-20 mg, chlorthalidone 25 mg, and carvedilol 25 mg BID. She checks blood pressure at home and had list with her. Systolic range between 105-128 with diastolic between 16-10. Reading in clinic at 120/78.  P: Amlodipine-olmesartan 10-20 mg Carvidilol 25 mg BID Chlorthalidone 25

## 2022-09-11 NOTE — Assessment & Plan Note (Signed)
History of acid reflex endoscopy completed 09/23 and biopsy was negative for h pylori, eosinophillic esophagitis but did show mild changes from gastritis. She is currently taking omeprazole 40 mg BID and continues to have symptoms. She has follow-up with GI scheduled in a few weeks. P: Change omeprazole 40 mg BID to pantoprazole 40 mg BID

## 2022-09-11 NOTE — Assessment & Plan Note (Signed)
Last BMP 9/23 with GFR 40. She reports having blood work done at nephrologist in December but I am unable to see record. She has follow-up with Dr. Malen Gauze set for July. P: Last urine micro wnl Repeat BMP

## 2022-09-11 NOTE — Patient Instructions (Addendum)
Thank you, [name] for allowing [place] to provide your care today.   Diabetes   Blood pressure Great work on blood pressure! I sent in refills and updated med list.  Kidney disease I will call with results. Please follow-up with Dr. Malen Gauze. Controlled diabetes and blood pressure is the best thing you can do for your kidneys.  Acid reflux Please try pantoprazole 40 mg 2 x daily to see if this helps with symptoms. I recommend that you follow-up with Dr. Bosie Clos for colonoscopy.  I have ordered the following labs for you:   Lab Orders         BMP8+Anion Gap         POC Hbg A1C      I have ordered the following medication/changed the following medications:   Stop the following medications: Medications Discontinued During This Encounter  Medication Reason   cloNIDine (CATAPRES) 0.2 MG tablet    amlodipine-olmesartan (AZOR) 10-20 MG tablet Reorder   omeprazole (PRILOSEC) 40 MG capsule      Start the following medications: Meds ordered this encounter  Medications   amlodipine-olmesartan (AZOR) 10-20 MG tablet    Sig: Take 1 tablet by mouth daily    Dispense:  90 tablet    Refill:  3    IM program   pantoprazole (PROTONIX) 40 MG tablet    Sig: Take 1 tablet (40 mg total) by mouth 2 (two) times daily.    Dispense:  60 tablet    Refill:  11    IM program     Follow up: 3 months   We look forward to seeing you next time. Please call our clinic at [phone] if you have any questions or concerns. The best time to call is Monday-Friday from 9am-4pm, but there is someone available 24/7. If after hours or the weekend, call the main hospital number and ask for the Internal Medicine Resident On-Call. If you need medication refills, please notify your pharmacy one week in advance and they will send [place] a request.   Thank you for trusting me with your care. Wishing you the best!   Rudene Christians, DO [facility]

## 2022-09-11 NOTE — Progress Notes (Signed)
Subjective:  CC: blood pressure follow-up  HPI:  [name] is a 62 y.o. female with a past medical history stated below and presents today for follow-up on blood pressure. Please see problem based assessment and plan for additional details.  Past Medical History:  Diagnosis Date   Chronic kidney disease (CKD), stage III (moderate) (HCC)    DIABETES MELLITUS, TYPE II 06/02/2006   Last HBA1C 8.7. , check 3 times daily   Epidermal inclusion cyst 03/23/2020   Hematoma of right breast 04/10/2021   Hydronephrosis    sees Dr Malen Gauze at [facility] ,   HYPERLIPIDEMIA 06/02/2006   Qualifier: Diagnosis of  By: Noel Gerold MD, Christiane Ha     HYPERTENSION 06/02/2006   Filed Vitals:   01/21/11 1134  BP: 162/98  Pulse: 80  Temp: 97.4 F (36.3 C)  TempSrc: Oral  Resp: 20  Height: 5\' 8"  (1.727 m)  Weight: 261 lb (118.389 kg)      Persistent proteinuria 01/21/2018   Personal history of colonic polyps 11/09/2018    Current Outpatient Medications on File Prior to Visit  Medication Sig Dispense Refill   acetaminophen (TYLENOL) 500 MG tablet Take 500 mg by mouth every 6 (six) hours as needed for moderate pain.     ascorbic acid (VITAMIN C) 500 MG tablet Take 500 mg by mouth daily.     ASPIRIN LOW DOSE 81 MG tablet TAKE 1 Tablet BY MOUTH ONCE EVERY DAY 90 tablet 3   atorvastatin (LIPITOR) 40 MG tablet TAKE 1 Tablet BY MOUTH ONCE EVERY DAY 90 tablet 3   Blood Glucose Monitoring Suppl (WAVESENSE PRESTO) W/DEVICE KIT 1 each by Does not apply route 2 (two) times daily. 1 each 0   carvedilol (COREG) 25 MG tablet TAKE 1 Tablet  BY MOUTH TWICE DAILY 180 tablet 3   chlorthalidone (HYGROTON) 25 MG tablet Take 1 tablet (25 mg total) by mouth daily 90 tablet 2   cholecalciferol (VITAMIN D3) 25 MCG (1000 UNIT) tablet Take 1 tablet (1,000 Units total) by mouth 3 (three) times a week. 36 tablet 3   glucose blood (WAVESENSE PRESTO) test strip Use to test blood sugar 3 times daily. diag code E11.9. Insulin  dependent 100 each 11   Insulin Glargine (BASAGLAR KWIKPEN) 100 UNIT/ML Inject 26 Units into the skin at bedtime. 15 mL 5   insulin lispro (HUMALOG KWIKPEN) 100 UNIT/ML KwikPen INJECT 6 UNITS UNDER THE SKIN BEFORE BREAKFAST AND LUNCH AND 4 UNITS BEFORE DINNER. 15 mL 5   Insulin Pen Needle (PEN NEEDLES) 31G X 5 MM MISC 4 Doses/Fill by Does not apply route daily. 100 each 11   Insulin Syringe-Needle U-100 31G X 15/64" 0.3 ML MISC 1 each by Does not apply route 2 (two) times daily with a meal. 100 each 11   INVOKANA 300 MG TABS tablet TAKE 1 Tablet BY MOUTH ONCE EVERY DAY BEFORE BREAKFAST 30 tablet 5   Lancets 30G MISC 1 each by Does not apply route 2 (two) times daily. 100 each 12   magnesium oxide (MAG-OX) 400 MG tablet Take 400 mg by mouth daily.     Polyethyl Glycol-Propyl Glycol (SYSTANE OP) Place 1 drop into both eyes daily as needed (irritation).     polyethylene glycol (MIRALAX / GLYCOLAX) 17 g packet Take 17 g by mouth daily as needed for moderate constipation.     sitaGLIPtin-metformin (JANUMET) 50-1000 MG tablet Take 1 tablet by mouth 2 (two) times daily with a meal. 60 tablet 11  valACYclovir (VALTREX) 500 MG tablet Take 500 mg by mouth 2 (two) times daily as needed (fever blisters).     No current facility-administered medications on file prior to visit.    Family History  Problem Relation Age of Onset   Diabetes Mother    Other Neg Hx    Breast cancer Neg Hx     Social History   Socioeconomic History   Marital status: Single    Spouse name: Not on file   Number of children: 1   Years of education: 12   Highest education level: High school graduate  Occupational History   Occupation: Lobbyist: [employer]  Tobacco Use   Smoking status: Former    Types: Cigarettes    Quit date: 10/15/1982    Years since quitting: 39.9   Smokeless tobacco: Never  Vaping Use   Vaping Use: Never used  Substance and Sexual Activity   Alcohol use: No    Alcohol/week:  0.0 standard drinks of alcohol   Drug use: No   Sexual activity: Not Currently  Other Topics Concern   Not on file  Social History Narrative   Financial assistance approved for 100% discount at [facility] and has [facility] card per [employer]   02/12/10         Social Determinants of Health   Financial Resource Strain: Low Risk  (03/25/2022)   Overall Financial Resource Strain (CARDIA)    Difficulty of Paying Living Expenses: Not hard at all  Food Insecurity: No Food Insecurity (03/25/2022)   Hunger Vital Sign    Worried About Running Out of Food in the Last Year: Never true    Ran Out of Food in the Last Year: Never true  Transportation Needs: No Transportation Needs (03/25/2022)   PRAPARE - Administrator, Civil Service (Medical): No    Lack of Transportation (Non-Medical): No  Physical Activity: Not on file  Stress: Not on file  Social Connections: Moderately Isolated (03/25/2022)   Social Connection and Isolation Panel [NHANES]    Frequency of Communication with Friends and Family: Twice a week    Frequency of Social Gatherings with Friends and Family: Once a week    Attends Religious Services: 1 to 4 times per year    Active Member of [employer] or Organizations: No    Attends Banker Meetings: Never    Marital Status: Never married  Intimate Partner Violence: Not At Risk (03/25/2022)   Humiliation, Afraid, Rape, and Kick questionnaire    Fear of Current or Ex-Partner: No    Emotionally Abused: No    Physically Abused: No    Sexually Abused: No    Review of Systems: ROS negative except for what is noted on the assessment and plan.  Objective:   Vitals:   09/11/22 1011  BP: 120/78  Pulse: 74  SpO2: 99%  Weight: 220 lb 1.6 oz (99.8 kg)  Height: 5\' 8"  (1.727 m)    Physical Exam: Constitutional: well-appearing  Cardiovascular: regular rate and rhythm, no m/r/g Pulmonary/Chest: normal work of breathing on room air, lungs clear to auscultation  bilaterally Abdominal: soft, non-tender, non-distended, no epigastric tenderness MSK: normal bulk and tone Skin: warm and dry   Assessment & Plan:  Stage 3 chronic kidney disease (HCC) Last BMP 9/23 with GFR 40. She reports having blood work done at nephrologist in December but I am unable to see record. She has follow-up with Dr. Malen Gauze set for July.  P: Last urine micro wnl Repeat BMP  Resistant hypertension Stopped clonidine 5/17, removed from med list. She endorses adherence with amlodipine-olmesartan 10-20 mg, chlorthalidone 25 mg, and carvedilol 25 mg BID. She checks blood pressure at home and had list with her. Systolic range between 105-128 with diastolic between 16-10. Reading in clinic at 120/78.  P: Amlodipine-olmesartan 10-20 mg Carvidilol 25 mg BID Chlorthalidone 25  Diabetes mellitus with diabetic nephropathy (HCC) A1c improved from 8.8 to 7.7%. Glucometer reviewed and fasting blood sugars consistently <140. She did have a few episodes of blood sugars in 300s at dinner and she recalls specific meals that lead to this.   P: Glargine 26 units Humalog 6 units B L and 4 units D Continue janumet and invokana Repeat A1c in September  GERD (gastroesophageal reflux disease) History of acid reflex endoscopy completed 09/23 and biopsy was negative for h pylori, eosinophillic esophagitis but did show mild changes from gastritis. She is currently taking omeprazole 40 mg BID and continues to have symptoms. She has follow-up with GI scheduled in a few weeks. P: Change omeprazole 40 mg BID to pantoprazole 40 mg BID   Patient discussed with Dr. Josetta Huddle Brynlei Klausner, D.O. [facility]  PGY-2 Pager: [phone]  Phone: [phone] Date 09/11/2022  Time 1:30 PM

## 2022-09-12 NOTE — Addendum Note (Signed)
Addended by: Lucille Passy on: 09/12/2022 11:07 AM   Modules accepted: Orders

## 2022-09-12 NOTE — Progress Notes (Signed)
Internal Medicine Clinic Attending  Case discussed with Dr. Masters  At the time of the visit.  We reviewed the resident's history and exam and pertinent patient test results.  I agree with the assessment, diagnosis, and plan of care documented in the resident's note.  

## 2022-09-13 LAB — BMP8+ANION GAP
Anion Gap: 17 mmol/L (ref 10.0–18.0)
BUN/Creatinine Ratio: 17 (ref 12–28)
BUN: 23 mg/dL (ref 8–27)
CO2: 19 mmol/L — ABNORMAL LOW (ref 20–29)
Calcium: 9.9 mg/dL (ref 8.7–10.3)
Chloride: 102 mmol/L (ref 96–106)
Creatinine, Ser: 1.37 mg/dL — ABNORMAL HIGH (ref 0.57–1.00)
Glucose: 140 mg/dL — ABNORMAL HIGH (ref 70–99)
Potassium: 4.2 mmol/L (ref 3.5–5.2)
Sodium: 138 mmol/L (ref 134–144)
eGFR: 44 mL/min/{1.73_m2} — ABNORMAL LOW (ref >59)

## 2022-10-03 ENCOUNTER — Other Ambulatory Visit: Payer: Self-pay

## 2022-10-03 ENCOUNTER — Encounter: Payer: Self-pay | Admitting: Dietician

## 2022-10-03 DIAGNOSIS — E119 Type 2 diabetes mellitus without complications: Secondary | ICD-10-CM

## 2022-10-03 MED ORDER — CANAGLIFLOZIN 300 MG PO TABS
300.0000 mg | ORAL_TABLET | Freq: Every day | ORAL | 5 refills | Status: DC
Start: 2022-10-03 — End: 2023-03-03

## 2022-10-05 ENCOUNTER — Other Ambulatory Visit: Payer: Self-pay

## 2022-11-03 ENCOUNTER — Other Ambulatory Visit: Payer: Self-pay

## 2022-11-03 ENCOUNTER — Other Ambulatory Visit (HOSPITAL_COMMUNITY): Payer: Self-pay

## 2022-12-01 ENCOUNTER — Ambulatory Visit (INDEPENDENT_AMBULATORY_CARE_PROVIDER_SITE_OTHER): Payer: Self-pay | Admitting: Student

## 2022-12-01 ENCOUNTER — Other Ambulatory Visit: Payer: Self-pay

## 2022-12-01 ENCOUNTER — Encounter: Payer: Self-pay | Admitting: Student

## 2022-12-01 VITALS — BP 124/82 | HR 78 | Temp 98.4°F | Ht 68.0 in | Wt 217.7 lb

## 2022-12-01 DIAGNOSIS — N1832 Chronic kidney disease, stage 3b: Secondary | ICD-10-CM

## 2022-12-01 DIAGNOSIS — I1A0 Resistant hypertension: Secondary | ICD-10-CM

## 2022-12-01 DIAGNOSIS — E1122 Type 2 diabetes mellitus with diabetic chronic kidney disease: Secondary | ICD-10-CM

## 2022-12-01 DIAGNOSIS — E6609 Other obesity due to excess calories: Secondary | ICD-10-CM

## 2022-12-01 DIAGNOSIS — E1121 Type 2 diabetes mellitus with diabetic nephropathy: Secondary | ICD-10-CM

## 2022-12-01 DIAGNOSIS — Z794 Long term (current) use of insulin: Secondary | ICD-10-CM

## 2022-12-01 DIAGNOSIS — E785 Hyperlipidemia, unspecified: Secondary | ICD-10-CM

## 2022-12-01 DIAGNOSIS — Z23 Encounter for immunization: Secondary | ICD-10-CM

## 2022-12-01 NOTE — Assessment & Plan Note (Signed)
At goal with current regimen. Reviewed recent BMP on 9/3; hypomagnesemia treated with mag BID otherwise no abnormalities. Home BP readings match office. No changes to current management  Continue: -Amlodipine-olmesartan 10-20 mg daily -Chlorthalidone 25 mg daily -Carvedilol 25 mg BID

## 2022-12-01 NOTE — Assessment & Plan Note (Signed)
   Continues to increase non-sugary drinks, more walking, and portion control to her current management. Will need to explore GLP1-RA once GI symptoms have improved to aide with weight management

## 2022-12-01 NOTE — Assessment & Plan Note (Signed)
Continued improvement in her lifestyle modifications, weight, and good adherence to current management. Has has BG in low 80s for dinner; she does not take her mealtime insuline as she fears late night and early morning lows seen on her log. Her morning BG <140 since last visit, which is consistent with what has been seen at prior visit.  Last A1c:  Lab Results  Component Value Date   HGBA1C 7.7 (A) 09/11/2022  She will meeting with her podiatrist and ophthalmologist later this month -A1c today -Lipid panel today; continue Lipitor 40 mg for now -Continue: Glargine 26 units Humalog 6 units B L and 4 units D Continue janumet and invokana -Discussed that if there is improvement in the A1c, patient can stop taking mealtime insulin -Currently working on GERD and belching symptoms, which are improving. This patient would benefit from switching from DDP4i to GLP1 RA to help with DM, obseisty management, and renal and cardioprotection

## 2022-12-01 NOTE — Assessment & Plan Note (Signed)
Lipid panel today

## 2022-12-01 NOTE — Assessment & Plan Note (Signed)
Follows up with Dr. Malen Gauze. Last seen on 11/18/2022. Last cr 1.61 and GFR 36. U PCR 214. Patient on ARB, SGLT2i currently

## 2022-12-01 NOTE — Patient Instructions (Signed)
Thank you, [name] for allowing [place] to provide your care today. Today we discussed   Your blood pressure and weight: CONGRATULATIONS! YOU ARE DOING AN AMAZING JOB! So proud of the hard work you have put in.   Continue the medications as you discussed with Dr. Malen Gauze.  Diabetes: Lets continue the Glargine 26 units, Invokana, Janumet. Check your blood sugars before meals to guide the dose of your mealtime insulin. If your A1c is lower this time around, we may be able to decrease this or stop it. -remember to attend all your appoitnments with your eye and foot doctots  Lipids: we will follow up the blood test today and see if we need to increase or add medications to protect your heart and organs  Reflux: as this improves with the current medication, it may good to then think about medications like Ozempic, which would help you with diabetes, weight, and kidney disease. But gastrointestinal side effects are common and I would want your current symptoms to improve before we try this medication.    I have ordered the following labs for you:   Lab Orders         Lipid Profile         Hemoglobin A1c      I will call if any are abnormal. All of your labs can be accessed through "My Chart".   My Chart Access: [email or web address]  Please follow-up in: 3 months    We look forward to seeing you next time. Please call our clinic at [phone] if you have any questions or concerns. The best time to call is Monday-Friday from 9am-4pm, but there is someone available 24/7. If after hours or the weekend, call the main hospital number and ask for the Internal Medicine Resident On-Call. If you need medication refills, please notify your pharmacy one week in advance and they will send [place] a request.   Thank you for letting us take part in your care. Wishing you the best!  Morene Crocker, MD 12/01/2022, 10:57 AM [facility]  Resident, PGY-2

## 2022-12-01 NOTE — Progress Notes (Signed)
Subjective:  CC: diabetes follow up  HPI:  [name] is a 62 y.o. female with a past medical history stated below and presents today for diabetes follow up. Please see problem based assessment and plan for additional details.  Past Medical History:  Diagnosis Date   Chronic kidney disease (CKD), stage III (moderate) (HCC)    DIABETES MELLITUS, TYPE II 06/02/2006   Last HBA1C 8.7. , check 3 times daily   Epidermal inclusion cyst 03/23/2020   Hematoma of right breast 04/10/2021   Hydronephrosis    sees Dr Ashley Long at [place] [facility] ,   HYPERLIPIDEMIA 06/02/2006   Qualifier: Diagnosis of  By: Ashley Long, Ashley Long     HYPERTENSION 06/02/2006   Filed Vitals:   01/21/11 1134  BP: 162/98  Pulse: 80  Temp: 97.4 F (36.3 C)  TempSrc: Oral  Resp: 20  Height: 5\' 8"  (1.727 m)  Weight: 261 lb (118.389 kg)      Persistent proteinuria 01/21/2018   Personal history of colonic polyps 11/09/2018    Current Outpatient Medications on File Prior to Visit  Medication Sig Dispense Refill   acetaminophen (TYLENOL) 500 MG tablet Take 500 mg by mouth every 6 (six) hours as needed for moderate pain.     amlodipine-olmesartan (AZOR) 10-20 MG tablet Take 1 tablet by mouth daily 90 tablet 3   ascorbic acid (VITAMIN C) 500 MG tablet Take 500 mg by mouth daily.     ASPIRIN LOW DOSE 81 MG tablet TAKE 1 Tablet BY MOUTH ONCE EVERY DAY 90 tablet 3   atorvastatin (LIPITOR) 40 MG tablet TAKE 1 Tablet BY MOUTH ONCE EVERY DAY 90 tablet 3   Blood Glucose Monitoring Suppl (WAVESENSE PRESTO) W/DEVICE KIT 1 each by Does not apply route 2 (two) times daily. 1 each 0   canagliflozin (INVOKANA) 300 MG TABS tablet Take 1 tablet (300 mg total) by mouth daily before breakfast. 30 tablet 5   carvedilol (COREG) 25 MG tablet TAKE 1 Tablet  BY MOUTH TWICE DAILY 180 tablet 3   chlorthalidone (HYGROTON) 25 MG tablet Take 1 tablet (25 mg total) by mouth daily 90 tablet 2   cholecalciferol (VITAMIN D3) 25 MCG (1000 UNIT)  tablet Take 1 tablet (1,000 Units total) by mouth 3 (three) times a week. 36 tablet 3   glucose blood (WAVESENSE PRESTO) test strip Use to test blood sugar 3 times daily. diag code E11.9. Insulin dependent 100 each 11   Insulin Glargine (BASAGLAR KWIKPEN) 100 UNIT/ML Inject 26 Units into the skin at bedtime. 15 mL 5   insulin lispro (HUMALOG KWIKPEN) 100 UNIT/ML KwikPen INJECT 6 UNITS UNDER THE SKIN BEFORE BREAKFAST AND LUNCH AND 4 UNITS BEFORE DINNER. 15 mL 5   Insulin Pen Needle (PEN NEEDLES) 31G X 5 MM MISC 4 Doses/Fill by Does not apply route daily. 100 each 11   Insulin Syringe-Needle U-100 31G X 15/64" 0.3 ML MISC 1 each by Does not apply route 2 (two) times daily with a meal. 100 each 11   Lancets 30G MISC 1 each by Does not apply route 2 (two) times daily. 100 each 12   magnesium oxide (MAG-OX) 400 MG tablet Take 400 mg by mouth daily.     pantoprazole (PROTONIX) 40 MG tablet Take 1 tablet (40 mg total) by mouth 2 (two) times daily. 60 tablet 11   Polyethyl Glycol-Propyl Glycol (SYSTANE OP) Place 1 drop into both eyes daily as needed (irritation).     polyethylene glycol (MIRALAX /  GLYCOLAX) 17 g packet Take 17 g by mouth daily as needed for moderate constipation.     sitaGLIPtin-metformin (JANUMET) 50-1000 MG tablet Take 1 tablet by mouth 2 (two) times daily with a meal. 60 tablet 11   valACYclovir (VALTREX) 500 MG tablet Take 500 mg by mouth 2 (two) times daily as needed (fever blisters).     No current facility-administered medications on file prior to visit.    Family History  Problem Relation Age of Onset   Diabetes Mother    Other Neg Hx    Breast cancer Neg Hx     Social History   Socioeconomic History   Marital status: Single    Spouse name: Not on file   Number of children: 1   Years of education: 12   Highest education level: High school graduate  Occupational History   Occupation: Lobbyist: [employer]  Tobacco Use   Smoking status: Former     Current packs/day: 0.00    Types: Cigarettes    Quit date: 10/15/1982    Years since quitting: 40.1   Smokeless tobacco: Never  Vaping Use   Vaping status: Never Used  Substance and Sexual Activity   Alcohol use: No    Alcohol/week: 0.0 standard drinks of alcohol   Drug use: No   Sexual activity: Not Currently  Other Topics Concern   Not on file  Social History Narrative   Financial assistance approved for 100% discount at [facility] and has [facility] card per [employer]   02/12/10         Social Determinants of Health   Financial Resource Strain: Low Risk  (03/25/2022)   Overall Financial Resource Strain (CARDIA)    Difficulty of Paying Living Expenses: Not hard at all  Food Insecurity: No Food Insecurity (12/01/2022)   Hunger Vital Sign    Worried About Running Out of Food in the Last Year: Never true    Ran Out of Food in the Last Year: Never true  Transportation Needs: No Transportation Needs (12/01/2022)   PRAPARE - Administrator, Civil Service (Medical): No    Lack of Transportation (Non-Medical): No  Physical Activity: Not on file  Stress: Not on file  Social Connections: Moderately Isolated (03/25/2022)   Social Connection and Isolation Panel [NHANES]    Frequency of Communication with Friends and Family: Twice a week    Frequency of Social Gatherings with Friends and Family: Once a week    Attends Religious Services: 1 to 4 times per year    Active Member of [employer] or Organizations: No    Attends Banker Meetings: Never    Marital Status: Never married  Intimate Partner Violence: Not At Risk (12/01/2022)   Humiliation, Afraid, Rape, and Kick questionnaire    Fear of Current or Ex-Partner: No    Emotionally Abused: No    Physically Abused: No    Sexually Abused: No    Review of Systems: ROS negative except for what is noted on the assessment and plan.  Objective:   Vitals:   12/01/22 1024  BP: 124/82  Pulse: 78  Temp: 98.4 F (36.9 C)   TempSrc: Oral  SpO2: 100%  Weight: 217 lb 11.2 oz (98.7 kg)  Height: 5\' 8"  (1.727 m)    Physical Exam: Constitutional: well-appearing woman sitting in chair, in no acute distress HENT:  mucous membranes moist Eyes: conjunctiva non-erythematous Neck: supple Cardiovascular: regular rate and rhythm, no m/r/g  Pulmonary/Chest: normal work of breathing on room air, lungs clear to auscultation bilaterally Abdominal: soft, non-tender, non-distended MSK: normal bulk and tone, no lower extremity edema Neurological: alert & oriented  Skin: warm and dry Psych: Pleasant mood and affect       12/01/2022   10:24 AM  Depression screen PHQ 2/9  Decreased Interest 0  Down, Depressed, Hopeless 0  PHQ - 2 Score 0  Altered sleeping 0  Tired, decreased energy 0  Change in appetite 0  Feeling bad or failure about yourself  0  Trouble concentrating 0  Moving slowly or fidgety/restless 0  Suicidal thoughts 0  PHQ-9 Score 0  Difficult doing work/chores Not difficult at all       11/28/2021    4:57 PM  GAD 7 : Generalized Anxiety Score  Nervous, Anxious, on Edge 0  Control/stop worrying 0  Worry too much - different things 0  Trouble relaxing 0  Restless 0  Easily annoyed or irritable 0  Afraid - awful might happen 0  Total GAD 7 Score 0  Anxiety Difficulty Not difficult at all       Assessment & Plan:   Resistant hypertension At goal with current regimen. Reviewed recent BMP on 9/3; hypomagnesemia treated with mag BID otherwise no abnormalities. Home BP readings match office. No changes to current management  Continue: -Amlodipine-olmesartan 10-20 mg daily -Chlorthalidone 25 mg daily -Carvedilol 25 mg BID   Diabetes mellitus with diabetic nephropathy (HCC) Continued improvement in her lifestyle modifications, weight, and good adherence to current management. Has has BG in low 80s for dinner; she does not take her mealtime insuline as she fears late night and early morning lows  seen on her log. Her morning BG <140 since last visit, which is consistent with what has been seen at prior visit.  Last A1c:  Lab Results  Component Value Date   HGBA1C 7.7 (A) 09/11/2022  She will meeting with her podiatrist and ophthalmologist later this month -A1c today -Lipid panel today; continue Lipitor 40 mg for now -Continue: Glargine 26 units Humalog 6 units B L and 4 units D Continue janumet and invokana -Discussed that if there is improvement in the A1c, patient can stop taking mealtime insulin -Currently working on GERD and belching symptoms, which are improving. This patient would benefit from switching from DDP4i to GLP1 RA to help with DM, obseisty management, and renal and cardioprotection  CKD stage 3b, GFR 30-44 ml/min (HCC) Follows up with Dr. Malen Long. Last seen on 11/18/2022. Last cr 1.61 and GFR 36. U PCR 214. Patient on ARB, SGLT2i currently  Hyperlipidemia Lipid panel today  Obesity with serious comorbidity   Continues to increase non-sugary drinks, more walking, and portion control to her current management. Will need to explore GLP1-RA once GI symptoms have improved to aide with weight management    Return in about 3 months (around 03/02/2023) for Diabetes, HTN, and GERD symptoms with PCP.  Patient discussed with Dr. Gardiner Ramus, [facility] - PGY-2 12/01/2022, 1:27 PM

## 2022-12-02 ENCOUNTER — Other Ambulatory Visit (HOSPITAL_COMMUNITY): Payer: Self-pay

## 2022-12-02 LAB — HEMOGLOBIN A1C
Est. average glucose Bld gHb Est-mCnc: 160 mg/dL
Hgb A1c MFr Bld: 7.2 % — ABNORMAL HIGH (ref 4.8–5.6)

## 2022-12-02 NOTE — Progress Notes (Signed)
Internal Medicine Clinic Attending  Case discussed with Dr. Daiva Eves  At the time of the visit.  We reviewed the resident's history and exam and pertinent patient test results.  I agree with the assessment, diagnosis, and plan of care documented in the resident's note.

## 2022-12-03 ENCOUNTER — Other Ambulatory Visit (HOSPITAL_COMMUNITY): Payer: Self-pay

## 2022-12-03 ENCOUNTER — Other Ambulatory Visit: Payer: Self-pay

## 2022-12-04 NOTE — Progress Notes (Signed)
Decrease in A1c, almost at goal. Will continue with medications as they are. Excellent cholesterol control with current regimen. No changes at this time.

## 2022-12-31 ENCOUNTER — Other Ambulatory Visit (HOSPITAL_COMMUNITY): Payer: Self-pay

## 2023-01-01 ENCOUNTER — Other Ambulatory Visit: Payer: Self-pay

## 2023-01-01 ENCOUNTER — Other Ambulatory Visit (HOSPITAL_COMMUNITY): Payer: Self-pay

## 2023-01-02 ENCOUNTER — Other Ambulatory Visit (HOSPITAL_COMMUNITY): Payer: Self-pay

## 2023-01-09 ENCOUNTER — Telehealth: Payer: Self-pay | Admitting: Podiatry

## 2023-01-09 NOTE — Telephone Encounter (Signed)
Left message for pt to call to schedule an appt to have toenail removed if she is still wanting to have procedure.

## 2023-01-09 NOTE — Telephone Encounter (Signed)
-----   Message from Vivi Barrack sent at 01/01/2023  7:39 AM EDT ----- This was [facility]. She wanted to have her toenail removed, but I did not have the stuff to do it in the office. She saw Dr. Logan Bores before. Can you call her to get her scheduled if she still wants to have it removed? Please put on the note that she is no charge and from the clinic. Thanks!

## 2023-01-19 ENCOUNTER — Ambulatory Visit: Payer: No Typology Code available for payment source

## 2023-01-29 ENCOUNTER — Ambulatory Visit (INDEPENDENT_AMBULATORY_CARE_PROVIDER_SITE_OTHER): Payer: Self-pay | Admitting: Podiatry

## 2023-01-29 ENCOUNTER — Telehealth: Payer: Self-pay

## 2023-01-29 DIAGNOSIS — L603 Nail dystrophy: Secondary | ICD-10-CM

## 2023-01-29 MED ORDER — CEPHALEXIN 500 MG PO CAPS: 500.0000 mg | ORAL_CAPSULE | Freq: Three times a day (TID) | ORAL | 0 refills | Status: DC

## 2023-01-29 NOTE — Patient Instructions (Signed)

## 2023-01-29 NOTE — Progress Notes (Signed)
  Subjective:  Patient ID: Ashley Long, female    DOB: 1960-12-22,  MRN: [id number]  No chief complaint on file.   Discussed the use of AI scribe software for clinical note transcription with the patient, who gave verbal consent to proceed.  History of Present Illness          62 year old female presents for removal of the left big toenail. They have decided to proceed with permanent removal, understanding the pros and cons of the procedure. The patient is aware that the procedure involves numbing the toe, removing the toenail, and applying phenol to prevent regrowth. They understand that there is a chance of reoccurrence and that the appearance may not be cosmetically pleasing. They are prepared for the post-procedure care, which includes soaking the toe in Epsom salts, applying antibiotic ointment, and keeping it covered until it scabs over. The patient is also aware of the potential for infection and has been prescribed an antibiotic.   Objective:    Physical Exam         General: AAO x3, NAD  Dermatological: Left hallux toenails hypertrophic, dystrophic with yellow, brown discoloration.  No hyperpigmentation. No edema, erythema.  No open lesions.   Vascular: Dorsalis Pedis artery and Posterior Tibial artery pedal pulses are 2/4 bilateral with immedate capillary fill time.  There is no pain with calf compression, swelling, warmth, erythema.   Neruologic: Grossly intact via light touch bilateral.   Musculoskeletal:Tenderness to the hallux toenail, no other areas of discomfort.   Gait: Unassisted, Nonantalgic.    No images are attached to the encounter.    Results          Assessment:  No diagnosis found.   Plan:  Patient was evaluated and treated and all questions answered.  Assessment and Plan          At this time, wants to proceed with total nail removal with chemical matricectomy to the left hallux due to thickening/pain. Risks and complications were  discussed with the patient for which they understand and  verbally consent to the procedure. Under sterile conditions a total of 3 mL of a mixture of 2% lidocaine plain and 0.5% Marcaine plain was infiltrated in a hallux block fashion. Once anesthetized, the skin was prepped in sterile fashion. A tourniquet was then applied. Next the left hallux toenail was sharply excised making sure to remove the entire offending nail border. Once the nail was removed, the area was debrided and the underlying skin was intact.  Phenol was then applied.  Gait and station fashion.  The area was irrigated and hemostasis was obtained.  A dry sterile dressing was applied. After application of the dressing the tourniquet was removed and there is found to be an immediate capillary refill time to the digit. The patient tolerated the procedure well any complications. Post procedure instructions were discussed the patient for which he verbally understood. Follow-up in one week for nail check or sooner if any problems are to arise. Discussed signs/symptoms of worsening infection and directed to call the office immediately should any occur or go directly to the emergency room. In the meantime, encouraged to call the office with any questions, concerns, changes symptoms. -Keflex      No follow-ups on file.

## 2023-01-29 NOTE — Telephone Encounter (Signed)
Patient called to report that she was bleeding through her bandage from her nail procedure this morning. It came through on her sock a little. She wanted to know if she should add to the current wrap or if she should change out the whole dressing -  Advised patient it is ok to change the whole dressing. She has the necessary supplies. She will call me back if she has any trouble or if the bleeding continues. Thanks

## 2023-01-30 ENCOUNTER — Encounter: Payer: Self-pay | Admitting: Podiatry

## 2023-01-31 ENCOUNTER — Other Ambulatory Visit: Payer: Self-pay | Admitting: Student

## 2023-01-31 DIAGNOSIS — I10 Essential (primary) hypertension: Secondary | ICD-10-CM

## 2023-02-02 ENCOUNTER — Other Ambulatory Visit (HOSPITAL_COMMUNITY): Payer: Self-pay

## 2023-02-02 ENCOUNTER — Other Ambulatory Visit: Payer: Self-pay

## 2023-02-02 NOTE — Telephone Encounter (Signed)
Next appt scheduled 02/25/23 with PCP.

## 2023-02-03 ENCOUNTER — Other Ambulatory Visit (HOSPITAL_COMMUNITY): Payer: Self-pay

## 2023-02-03 ENCOUNTER — Other Ambulatory Visit: Payer: Self-pay

## 2023-02-03 MED ORDER — CHLORTHALIDONE 25 MG PO TABS
25.0000 mg | ORAL_TABLET | Freq: Every day | ORAL | 2 refills | Status: DC
Start: 2023-02-03 — End: 2023-10-28
  Filled 2023-02-03: qty 30, 30d supply, fill #0
  Filled 2023-03-03: qty 30, 30d supply, fill #1
  Filled 2023-03-31: qty 30, 30d supply, fill #2
  Filled 2023-04-29: qty 30, 30d supply, fill #3
  Filled 2023-05-28: qty 30, 30d supply, fill #4
  Filled 2023-06-28: qty 30, 30d supply, fill #5
  Filled 2023-07-26: qty 30, 30d supply, fill #6
  Filled 2023-08-29: qty 30, 30d supply, fill #7
  Filled 2023-09-29: qty 30, 30d supply, fill #8

## 2023-02-05 ENCOUNTER — Other Ambulatory Visit: Payer: Self-pay | Admitting: Student

## 2023-02-05 ENCOUNTER — Ambulatory Visit
Admission: RE | Admit: 2023-02-05 | Discharge: 2023-02-05 | Disposition: A | Discharge status: Home / Self Care | Payer: No Typology Code available for payment source | Source: Ambulatory Visit | Attending: Internal Medicine | Admitting: Internal Medicine

## 2023-02-05 DIAGNOSIS — Z794 Long term (current) use of insulin: Secondary | ICD-10-CM

## 2023-02-05 DIAGNOSIS — N1831 Chronic kidney disease, stage 3a: Secondary | ICD-10-CM

## 2023-02-05 DIAGNOSIS — Z1231 Encounter for screening mammogram for malignant neoplasm of breast: Secondary | ICD-10-CM

## 2023-02-05 DIAGNOSIS — E1121 Type 2 diabetes mellitus with diabetic nephropathy: Secondary | ICD-10-CM

## 2023-02-05 DIAGNOSIS — I1A0 Resistant hypertension: Secondary | ICD-10-CM

## 2023-02-05 DIAGNOSIS — E1122 Type 2 diabetes mellitus with diabetic chronic kidney disease: Secondary | ICD-10-CM

## 2023-02-05 DIAGNOSIS — K219 Gastro-esophageal reflux disease without esophagitis: Secondary | ICD-10-CM

## 2023-02-10 ENCOUNTER — Other Ambulatory Visit: Payer: Self-pay | Admitting: Obstetrics and Gynecology

## 2023-02-10 ENCOUNTER — Other Ambulatory Visit: Payer: Self-pay | Admitting: Student

## 2023-02-10 ENCOUNTER — Telehealth: Payer: Self-pay

## 2023-02-10 ENCOUNTER — Telehealth: Payer: Self-pay | Admitting: Podiatry

## 2023-02-10 ENCOUNTER — Other Ambulatory Visit: Payer: Self-pay | Admitting: Internal Medicine

## 2023-02-10 DIAGNOSIS — R928 Other abnormal and inconclusive findings on diagnostic imaging of breast: Secondary | ICD-10-CM

## 2023-02-10 NOTE — Telephone Encounter (Signed)
Received call from patient. Shared results from screening mammogram and explained need for diagnostic study for asymmetry noted in R breast. Patient expressed understanding. No current abnormal symptoms, appearance, or discharges from R breast. I will see patient in clinic next month. All questions answered.

## 2023-02-10 NOTE — Telephone Encounter (Signed)
Pt called and had ingrown removed and this Thursday will be 2 wks of soaking, neosporin  and covering with bandage. How loing should she continue that. Or can she still soak it and leave it to get air? Please call pt with information

## 2023-02-10 NOTE — Telephone Encounter (Signed)
Pt is requesting  a call back .Marland Kitchen She stated that she went to do her mamo and they called her requesting she come back in for a retake .Marland Kitchen She is wondering if the results was sent to her PCP and  is there something for her to be worried about .Marland Kitchen When asked did she ask why they needed the retake she stated they told her  that they saw something and just wants to be sure .Marland KitchenMarland Kitchen

## 2023-02-10 NOTE — Telephone Encounter (Signed)
Called patient x 3 without success. Called daughter. She will text mother to call [facility] between 1-4 Pm today

## 2023-02-25 ENCOUNTER — Ambulatory Visit (INDEPENDENT_AMBULATORY_CARE_PROVIDER_SITE_OTHER): Payer: Self-pay | Admitting: Student

## 2023-02-25 VITALS — BP 130/80 | HR 77 | Temp 98.2°F | Ht 68.0 in | Wt 215.9 lb

## 2023-02-25 DIAGNOSIS — E1122 Type 2 diabetes mellitus with diabetic chronic kidney disease: Secondary | ICD-10-CM

## 2023-02-25 DIAGNOSIS — R928 Other abnormal and inconclusive findings on diagnostic imaging of breast: Secondary | ICD-10-CM

## 2023-02-25 DIAGNOSIS — Z6832 Body mass index (BMI) 32.0-32.9, adult: Secondary | ICD-10-CM

## 2023-02-25 DIAGNOSIS — N1832 Chronic kidney disease, stage 3b: Secondary | ICD-10-CM

## 2023-02-25 DIAGNOSIS — K219 Gastro-esophageal reflux disease without esophagitis: Secondary | ICD-10-CM

## 2023-02-25 DIAGNOSIS — E1121 Type 2 diabetes mellitus with diabetic nephropathy: Secondary | ICD-10-CM

## 2023-02-25 DIAGNOSIS — E6609 Other obesity due to excess calories: Secondary | ICD-10-CM

## 2023-02-25 DIAGNOSIS — Z794 Long term (current) use of insulin: Secondary | ICD-10-CM

## 2023-02-25 DIAGNOSIS — I1A0 Resistant hypertension: Secondary | ICD-10-CM

## 2023-02-25 NOTE — Patient Instructions (Addendum)
.  Thank you, [name] for allowing [place] to provide your care today. Today we discussed   Your blood pressure, diabetes, and your kidney disease. We also talked about the progression of disease of your kidneys and how we need to monitor your kidney function closely. For now, let's keep medications where they are. I will check the blood work below and then communicate with Dr. Malen Gauze to see if there are any changes that we should make in the mean time.'  Please keep the appointment for your breast ultrasound. We will follow up the results.  I have ordered the following labs for you:   Lab Orders         Renal function panel         Magnesium         Hemoglobin A1c      I will call if any are abnormal. All of your labs can be accessed through "My Chart".   My Chart Access: [email or web address]  Please follow-up in: 3 months for diabetes and other chronic conditions    We look forward to seeing you next time. Please call our clinic at [phone] if you have any questions or concerns. The best time to call is Monday-Friday from 9am-4pm, but there is someone available 24/7. If after hours or the weekend, call the main hospital number and ask for the Internal Medicine Resident On-Call. If you need medication refills, please notify your pharmacy one week in advance and they will send [place] a request.   Thank you for letting us take part in your care. Wishing you the best!  Morene Crocker, MD 02/25/2023, 10:54 AM [facility]

## 2023-02-25 NOTE — Progress Notes (Unsigned)
Subjective:  CC: Follow-up  HPI:  [name] is a 62 y.o. female with a past medical history stated below and presents today for diabetes and hypertension follow-up. Please see problem based assessment and plan for additional details.  Past Medical History:  Diagnosis Date   Chronic kidney disease (CKD), stage III (moderate) (HCC)    DIABETES MELLITUS, TYPE II 06/02/2006   Last HBA1C 8.7. , check 3 times daily   Epidermal inclusion cyst 03/23/2020   Hematoma of right breast 04/10/2021   Hydronephrosis    sees Dr Ashley Long at [facility] ,   HYPERLIPIDEMIA 06/02/2006   Qualifier: Diagnosis of  By: Ashley Gerold MD, Ashley Long     HYPERTENSION 06/02/2006   Filed Vitals:   01/21/11 1134  BP: 162/98  Pulse: 80  Temp: 97.4 F (36.3 C)  TempSrc: Oral  Resp: 20  Height: 5\' 8"  (1.727 m)  Weight: 261 lb (118.389 kg)      Persistent proteinuria 01/21/2018   Personal history of colonic polyps 11/09/2018    Current Outpatient Medications on File Prior to Visit  Medication Sig Dispense Refill   acetaminophen (TYLENOL) 500 MG tablet Take 500 mg by mouth every 6 (six) hours as needed for moderate pain.     amlodipine-olmesartan (AZOR) 10-20 MG tablet Take 1 tablet by mouth daily 90 tablet 3   ascorbic acid (VITAMIN C) 500 MG tablet Take 500 mg by mouth daily.     ASPIRIN LOW DOSE 81 MG tablet TAKE 1 Tablet BY MOUTH ONCE EVERY DAY 90 tablet 3   atorvastatin (LIPITOR) 40 MG tablet TAKE 1 Tablet BY MOUTH ONCE EVERY DAY 90 tablet 3   Blood Glucose Monitoring Suppl (WAVESENSE PRESTO) W/DEVICE KIT 1 each by Does not apply route 2 (two) times daily. 1 each 0   canagliflozin (INVOKANA) 300 MG TABS tablet Take 1 tablet (300 mg total) by mouth daily before breakfast. 30 tablet 5   carvedilol (COREG) 25 MG tablet TAKE 1 Tablet  BY MOUTH TWICE DAILY 180 tablet 3   cephALEXin (KEFLEX) 500 MG capsule Take 1 capsule (500 mg total) by mouth 3 (three) times daily. 21 capsule 0   chlorthalidone (HYGROTON)  25 MG tablet Take 1 tablet (25 mg total) by mouth daily 90 tablet 2   cholecalciferol (VITAMIN D3) 25 MCG (1000 UNIT) tablet Take 1 tablet (1,000 Units total) by mouth 3 (three) times a week. 36 tablet 3   glucose blood (WAVESENSE PRESTO) test strip Use to test blood sugar 3 times daily. diag code E11.9. Insulin dependent 100 each 11   Insulin Glargine (BASAGLAR KWIKPEN) 100 UNIT/ML Inject 26 Units into the skin at bedtime. 15 mL 5   insulin lispro (HUMALOG KWIKPEN) 100 UNIT/ML KwikPen INJECT 6 UNITS UNDER THE SKIN BEFORE BREAKFAST AND LUNCH AND 4 UNITS BEFORE DINNER. 15 mL 5   Insulin Pen Needle (PEN NEEDLES) 31G X 5 MM MISC 4 Doses/Fill by Does not apply route daily. 100 each 11   Insulin Syringe-Needle U-100 31G X 15/64" 0.3 ML MISC 1 each by Does not apply route 2 (two) times daily with a meal. 100 each 11   Lancets 30G MISC 1 each by Does not apply route 2 (two) times daily. 100 each 12   magnesium oxide (MAG-OX) 400 MG tablet Take 400 mg by mouth daily.     pantoprazole (PROTONIX) 40 MG tablet Take 1 tablet (40 mg total) by mouth 2 (two) times daily. 60 tablet 11   Polyethyl Glycol-Propyl  Glycol (SYSTANE OP) Place 1 drop into both eyes daily as needed (irritation).     polyethylene glycol (MIRALAX / GLYCOLAX) 17 g packet Take 17 g by mouth daily as needed for moderate constipation.     sitaGLIPtin-metformin (JANUMET) 50-1000 MG tablet Take 1 tablet by mouth 2 (two) times daily with a meal. 60 tablet 11   valACYclovir (VALTREX) 500 MG tablet Take 500 mg by mouth 2 (two) times daily as needed (fever blisters).     No current facility-administered medications on file prior to visit.    Family History  Problem Relation Age of Onset   Diabetes Mother    Other Neg Hx    Breast cancer Neg Hx     Social History   Socioeconomic History   Marital status: Single    Spouse name: Not on file   Number of children: 1   Years of education: 12   Highest education level: High school graduate   Occupational History   Occupation: Lobbyist: [employer]  Tobacco Use   Smoking status: Former    Current packs/day: 0.00    Types: Cigarettes    Quit date: 10/15/1982    Years since quitting: 40.3   Smokeless tobacco: Never  Vaping Use   Vaping status: Never Used  Substance and Sexual Activity   Alcohol use: No    Alcohol/week: 0.0 standard drinks of alcohol   Drug use: No   Sexual activity: Not Currently  Other Topics Concern   Not on file  Social History Narrative   Financial assistance approved for 100% discount at [facility] and has [facility] card per [employer]   02/12/10         Social Drivers of Health   Financial Resource Strain: Low Risk  (03/25/2022)   Overall Financial Resource Strain (CARDIA)    Difficulty of Paying Living Expenses: Not hard at all  Food Insecurity: No Food Insecurity (12/01/2022)   Hunger Vital Sign    Worried About Running Out of Food in the Last Year: Never true    Ran Out of Food in the Last Year: Never true  Transportation Needs: No Transportation Needs (12/01/2022)   PRAPARE - Administrator, Civil Service (Medical): No    Lack of Transportation (Non-Medical): No  Physical Activity: Not on file  Stress: Not on file  Social Connections: Moderately Isolated (03/25/2022)   Social Connection and Isolation Panel [NHANES]    Frequency of Communication with Friends and Family: Twice a week    Frequency of Social Gatherings with Friends and Family: Once a week    Attends Religious Services: 1 to 4 times per year    Active Member of [employer] or Organizations: No    Attends Banker Meetings: Never    Marital Status: Never married  Intimate Partner Violence: Not At Risk (12/01/2022)   Humiliation, Afraid, Rape, and Kick questionnaire    Fear of Current or Ex-Partner: No    Emotionally Abused: No    Physically Abused: No    Sexually Abused: No    Review of Systems: ROS negative except for what is noted on the  assessment and plan.  Objective:   Vitals:   02/25/23 1016  BP: 130/80  Pulse: 77  Temp: 98.2 F (36.8 C)  TempSrc: Oral  SpO2: 100%  Weight: 215 lb 14.4 oz (97.9 kg)  Height: 5\' 8"  (1.727 m)   Physical Exam: Constitutional: well-appearing woman sitting in chair, in  no acute distress HENT: normocephalic atraumatic, mucous membranes moist Eyes: conjunctiva non-erythematous Neck: supple Cardiovascular: regular rate and rhythm, no m/r/g Pulmonary/Chest: normal work of breathing on room air, lungs clear to auscultation bilaterally Abdominal: soft, non-tender, non-distended MSK: normal bulk and tone Neurological: alert & oriented x 3 Skin: warm and dry Psych: Pleasant mood and affect    Assessment & Plan:   Diabetes mellitus with diabetic nephropathy (HCC) As of 11/2022 patient's A1c was 7.2.  At the time she was taking glargine 26 unit, Humalog 6 units 3 times daily.  She was also on Janumet and Invokana.  Because of her improvement, we stopped the mealtime insulin.  She has been doing okay without episodes of hypoglycemia.  But she has noted some episodes of hyperglycemia as I see them in her glucometer as well.  Today her A1c is 8, and increased from the 7.2 at the last visit.  Revisited the Janumet use at this time, switching the DDP-4i to a GLP-1 receptor agonist.  Patient is amenable to this change.  Will discuss with our pharmacist technician as patient already receives medication through [facility] about the choice of GLP-1 receptor agonist.  In the meantime, this patient needs improvement in her glycemic control, and will return to the mealtime insulin for that nighttime coverage. Continue glargine 26 units and increase Humalog to 6 units with breakfast and lunch.  Restart 4 units with dinner Continue Invokana Continue Janumet; with the idea of continuing metformin 1000 twice daily moving forward and switching to GLP-1 RA as soon as [facility] approval goes through -Of note,  this patient has CKD 3B diagnosis with a recent decrease in her GFR from 40-36.  As of 11/2022 creatinine clearance was 56.  Patient may continue metformin at this current dose   Resistant hypertension Slightly above goal today.  However patient's blood pressure logs with SBP's consistently between 111 to low 120s.  Patient brought her blood pressure cuff to this visit and the reading is similar to the manual reading from our CMA.  She has been consistently taking the amlodipine-olmesartan 10-20 mg daily, chlorthalidone 25 mg daily, and carvedilol 25 mg twice daily.  No symptoms at this time -Will continue monitoring for now on current therapy knowing that given her CKD she should aim for a lower goal.  If persistently elevated or at 130, ARB could be increased which will help her with her known proteinuria as her creatinine clearance remains above 20.  GERD (gastroesophageal reflux disease) Improvement in her symptoms with new dose of pantoprazole however continues belching.  She is established with GI and plans to follow-up in the new year  CKD stage 3b, GFR 30-44 ml/min (HCC) Follows with Dr. Vallery Sa at [facility].  Will repeat BMP today to monitor progression of disease.  Obesity with serious comorbidity Current weight is 215 pounds from 217 in September of this year.  She continues to make lifestyle modifications.  However, given increase in A1c to 8, will switch DDP 4 to GLP-1 receptor agonist through [employer] program.  I ready email our medication assistance professional to start this application. -Follow-up at next office visit  Abnormal mammogram Screening mammogram revealed a possible asymmetry in the right breast warrants further evaluation. In the left breast, no findings suspicious for malignancy. No pain or abnormal gross appearance per patient. No discharge.  -Patient will undergo diagnostic mammogram and right breast ultrasound on 12/18 -Will follow-up results    Return in about  3 months (around 05/26/2023)  for DM, HTN, and lab work (please check [facility] app in case pt has labs from [facility]).  Patient discussed with Dr. Georgia Dom Daiva Eves, MD [facility]  02/26/2023, 9:34 AM

## 2023-02-26 ENCOUNTER — Encounter: Payer: Self-pay | Admitting: Student

## 2023-02-26 ENCOUNTER — Other Ambulatory Visit (HOSPITAL_COMMUNITY): Payer: Self-pay

## 2023-02-26 ENCOUNTER — Telehealth: Payer: Self-pay

## 2023-02-26 DIAGNOSIS — R928 Other abnormal and inconclusive findings on diagnostic imaging of breast: Secondary | ICD-10-CM | POA: Insufficient documentation

## 2023-02-26 LAB — HEMOGLOBIN A1C
Est. average glucose Bld gHb Est-mCnc: 183 mg/dL
Hgb A1c MFr Bld: 8 % — ABNORMAL HIGH (ref 4.8–5.6)

## 2023-02-26 NOTE — Assessment & Plan Note (Signed)
Improvement in her symptoms with new dose of pantoprazole however continues belching.  She is established with GI and plans to follow-up in the new year

## 2023-02-26 NOTE — Assessment & Plan Note (Signed)
Slightly above goal today.  However patient's blood pressure logs with SBP's consistently between 111 to low 120s.  Patient brought her blood pressure cuff to this visit and the reading is similar to the manual reading from our CMA.  She has been consistently taking the amlodipine-olmesartan 10-20 mg daily, chlorthalidone 25 mg daily, and carvedilol 25 mg twice daily.  No symptoms at this time -Will continue monitoring for now on current therapy knowing that given her CKD she should aim for a lower goal.  If persistently elevated or at 130, ARB could be increased which will help her with her known proteinuria as her creatinine clearance remains above 20.

## 2023-02-26 NOTE — Assessment & Plan Note (Signed)
As of 11/2022 patient's A1c was 7.2.  At the time she was taking glargine 26 unit, Humalog 6 units 3 times daily.  She was also on Janumet and Invokana.  Because of her improvement, we stopped the mealtime insulin.  She has been doing okay without episodes of hypoglycemia.  But she has noted some episodes of hyperglycemia as I see them in her glucometer as well.  Today her A1c is 8, and increased from the 7.2 at the last visit.  Revisited the Janumet use at this time, switching the DDP-4i to a GLP-1 receptor agonist.  Patient is amenable to this change.  Will discuss with our pharmacist technician as patient already receives medication through [facility] about the choice of GLP-1 receptor agonist.  In the meantime, this patient needs improvement in her glycemic control, and will return to the mealtime insulin for that nighttime coverage. Continue glargine 26 units and increase Humalog to 6 units with breakfast and lunch.  Restart 4 units with dinner Continue Invokana Continue Janumet; with the idea of continuing metformin 1000 twice daily moving forward and switching to GLP-1 RA as soon as [facility] approval goes through -Of note, this patient has CKD 3B diagnosis with a recent decrease in her GFR from 40-36.  As of 11/2022 creatinine clearance was 56.  Patient may continue metformin at this current dose

## 2023-02-26 NOTE — Assessment & Plan Note (Addendum)
Screening mammogram revealed a possible asymmetry in the right breast warrants further evaluation. In the left breast, no findings suspicious for malignancy. No pain or abnormal gross appearance per patient. No discharge.  -Patient will undergo diagnostic mammogram and right breast ultrasound on 12/18 -Will follow-up results

## 2023-02-26 NOTE — Telephone Encounter (Signed)
Telephoned patient at mobile number. Left a voice message with [facility] contact information. 

## 2023-02-26 NOTE — Assessment & Plan Note (Signed)
Current weight is 215 pounds from 217 in September of this year.  She continues to make lifestyle modifications.  However, given increase in A1c to 8, will switch DDP 4 to GLP-1 receptor agonist through [employer] program.  I ready email our medication assistance professional to start this application. -Follow-up at next office visit

## 2023-02-26 NOTE — Assessment & Plan Note (Signed)
Follows with Dr. Vallery Sa at [facility].  Will repeat BMP today to monitor progression of disease.

## 2023-02-27 LAB — MAGNESIUM: Magnesium: 1.6 mg/dL (ref 1.6–2.3)

## 2023-02-27 LAB — RENAL FUNCTION PANEL
Albumin: 4.1 g/dL (ref 3.8–4.9)
BUN/Creatinine Ratio: 19 (ref 12–28)
BUN: 21 mg/dL (ref 8–27)
CO2: 21 mmol/L (ref 20–29)
Calcium: 9.7 mg/dL (ref 8.7–10.3)
Chloride: 98 mmol/L (ref 96–106)
Creatinine, Ser: 1.12 mg/dL — ABNORMAL HIGH (ref 0.57–1.00)
Glucose: 113 mg/dL — ABNORMAL HIGH (ref 70–99)
Phosphorus: 3.6 mg/dL (ref 3.0–4.3)
Potassium: 4.3 mmol/L (ref 3.5–5.2)
Sodium: 136 mmol/L (ref 134–144)
eGFR: 56 mL/min/{1.73_m2} — ABNORMAL LOW (ref >59)

## 2023-03-03 ENCOUNTER — Other Ambulatory Visit: Payer: Self-pay

## 2023-03-03 ENCOUNTER — Ambulatory Visit: Payer: Self-pay | Admitting: Hematology and Oncology

## 2023-03-03 VITALS — BP 137/88 | Wt 209.0 lb

## 2023-03-03 DIAGNOSIS — N6489 Other specified disorders of breast: Secondary | ICD-10-CM

## 2023-03-03 NOTE — Progress Notes (Signed)
Ashley Long is a 62 y.o. female who presents to [facility] clinic today with no complaints. Follow up right breast asymmetry.   Pap Smear: Pap not smear completed today. Last Pap smear was 06/03/2020 and was normal. Per patient has no history of an abnormal Pap smear. Last Pap smear result is available in Epic.   Physical exam: Breasts Breasts symmetrical. No skin abnormalities bilateral breasts. No nipple retraction bilateral breasts. No nipple discharge bilateral breasts. No lymphadenopathy. No lumps palpated bilateral breasts.     MS 3D SCR MAMMO BILAT BR (aka MM) Result Date: 02/09/2023 CLINICAL DATA:  Screening. EXAM: DIGITAL SCREENING BILATERAL MAMMOGRAM WITH TOMOSYNTHESIS AND CAD TECHNIQUE: Bilateral screening digital craniocaudal and mediolateral oblique mammograms were obtained. Bilateral screening digital breast tomosynthesis was performed. The images were evaluated with computer-aided detection. COMPARISON:  Previous exam(s). ACR Breast Density Category a: The breasts are almost entirely fatty. FINDINGS: In the right breast, a possible asymmetry warrants further evaluation. In the left breast, no findings suspicious for malignancy. IMPRESSION: Further evaluation is suggested for possible asymmetry in the right breast. RECOMMENDATION: Diagnostic mammogram and possibly ultrasound of the right breast. (Code:[id number]) The patient will be contacted regarding the findings, and additional imaging will be scheduled. BI-RADS CATEGORY  0: Incomplete: Need additional imaging evaluation. Electronically Signed   By: Edwin Cap M.D.   On: 02/09/2023 12:49   MS DIGITAL SCREENING TOMO BILATERAL Result Date: 01/20/2022 CLINICAL DATA:  Screening. EXAM: DIGITAL SCREENING BILATERAL MAMMOGRAM WITH TOMOSYNTHESIS AND CAD TECHNIQUE: Bilateral screening digital craniocaudal and mediolateral oblique mammograms were obtained. Bilateral screening digital breast tomosynthesis was performed. The images were  evaluated with computer-aided detection. COMPARISON:  Previous exam(s). ACR Breast Density Category a: The breast tissue is almost entirely fatty. FINDINGS: There are no findings suspicious for malignancy. IMPRESSION: No mammographic evidence of malignancy. A result letter of this screening mammogram will be mailed directly to the patient. RECOMMENDATION: Screening mammogram in one year. (Code:[id number]) BI-RADS CATEGORY  1: Negative. Electronically Signed   By: Annia Belt M.D.   On: 01/20/2022 16:01   MM 3D SCREEN BREAST BILATERAL Result Date: 01/13/2021 CLINICAL DATA:  Screening. EXAM: DIGITAL SCREENING BILATERAL MAMMOGRAM WITH TOMOSYNTHESIS AND CAD TECHNIQUE: Bilateral screening digital craniocaudal and mediolateral oblique mammograms were obtained. Bilateral screening digital breast tomosynthesis was performed. The images were evaluated with computer-aided detection. COMPARISON:  Previous exam(s). ACR Breast Density Category a: The breast tissue is almost entirely fatty. FINDINGS: There are no findings suspicious for malignancy. IMPRESSION: No mammographic evidence of malignancy. A result letter of this screening mammogram will be mailed directly to the patient. RECOMMENDATION: Screening mammogram in one year. (Code:[id number]) BI-RADS CATEGORY  1: Negative. Electronically Signed   By: Frederico Hamman M.D.   On: 01/13/2021 09:50   MS DIGITAL SCREENING TOMO BILATERAL Result Date: 11/02/2019 CLINICAL DATA:  Screening. EXAM: DIGITAL SCREENING BILATERAL MAMMOGRAM WITH TOMO AND CAD COMPARISON:  Previous exam(s). ACR Breast Density Category a: The breast tissue is almost entirely fatty. FINDINGS: There are no findings suspicious for malignancy. Images were processed with CAD. IMPRESSION: No mammographic evidence of malignancy. A result letter of this screening mammogram will be mailed directly to the patient. RECOMMENDATION: Screening mammogram in one year. (Code:[id number]) BI-RADS CATEGORY  1: Negative.  Electronically Signed   By: Annia Belt M.D.   On: 11/02/2019 15:38   MS DIGITAL SCREENING TOMO BILATERAL Result Date: 09/28/2018 CLINICAL DATA:  Screening. EXAM: DIGITAL SCREENING BILATERAL MAMMOGRAM WITH TOMO AND CAD COMPARISON:  Previous exam(s). ACR Breast  Density Category b: There are scattered areas of fibroglandular density. FINDINGS: There are no findings suspicious for malignancy. Images were processed with CAD. IMPRESSION: No mammographic evidence of malignancy. A result letter of this screening mammogram will be mailed directly to the patient. RECOMMENDATION: Screening mammogram in one year. (Code:[id number]) BI-RADS CATEGORY  1: Negative. Electronically Signed   By: Amie Portland M.D.   On: 09/28/2018 09:25      Pelvic/Bimanual Pap is not indicated today    Smoking History: Patient has never smoked and was not referred to quit line.    Patient Navigation: Patient education provided. Access to services provided for patient through [facility] program. No interpreter provided. No transportation provided   Colorectal Cancer Screening: Per patient had colonoscopy 11/09/2018 - revealing moderately active chronic colitis; no evidence of dysplasia or malignancy.  No complaints today.    Breast and Cervical Cancer Risk Assessment: Patient does not have family history of breast cancer, known genetic mutations, or radiation treatment to the chest before age 59. Patient does not have history of cervical dysplasia, immunocompromised, or DES exposure in-utero.    Risk Scores as of Encounter on 03/03/2023     Ashley Long           5-year 1.44%   Lifetime 6.89%            Last calculated by Caprice Red, CMA on 03/03/2023 at  1:09 PM        A: [facility] exam without pap smear No complaints with benign exam.   P: Referred patient to the [facility] for a diagnostic mammogram. Appointment scheduled 03/03/2023.  Ilda Basset A, NP 03/03/2023 1:01 PM

## 2023-03-03 NOTE — Progress Notes (Signed)
Discussed with patient. Given uptrend in A1c, will start process of medication assistance for GLP1 RA. Until this goes through, patient is to remain on Sitagliptin-Metformin. GFR improved.

## 2023-03-03 NOTE — Patient Instructions (Signed)
Taught Ashley Long about self breast awareness and gave educational materials to take home. Patient did not need a Pap smear today due to last Pap smear was in 05/16/2020 per patient. Let her know [facility] will cover Pap smears every 5 years unless has a history of abnormal Pap smears. Referred patient to the [facility] for diagnostic mammogram. Appointment scheduled for 03/03/2023. Patient aware of appointment and will be there. Let patient know will follow up with her within the next couple weeks with results. Ashley Long verbalized understanding.  Pascal Lux, NP 1:03 PM

## 2023-03-04 ENCOUNTER — Ambulatory Visit
Admission: RE | Admit: 2023-03-04 | Discharge: 2023-03-04 | Disposition: A | Discharge status: Home / Self Care | Payer: No Typology Code available for payment source | Source: Ambulatory Visit | Attending: Obstetrics and Gynecology | Admitting: Obstetrics and Gynecology

## 2023-03-04 ENCOUNTER — Ambulatory Visit: Payer: No Typology Code available for payment source

## 2023-03-04 DIAGNOSIS — R928 Other abnormal and inconclusive findings on diagnostic imaging of breast: Secondary | ICD-10-CM

## 2023-03-04 NOTE — Progress Notes (Signed)
Internal Medicine Clinic Attending  Case discussed with the resident at the time of the visit.  We reviewed the resident's history and exam and pertinent patient test results.  I agree with the assessment, diagnosis, and plan of care documented in the resident's note.  

## 2023-03-25 ENCOUNTER — Other Ambulatory Visit: Payer: Self-pay | Admitting: Student

## 2023-03-25 DIAGNOSIS — I10 Essential (primary) hypertension: Secondary | ICD-10-CM

## 2023-03-25 NOTE — Telephone Encounter (Signed)
 Medication sent to pharmacy

## 2023-03-31 ENCOUNTER — Other Ambulatory Visit (HOSPITAL_COMMUNITY): Payer: Self-pay

## 2023-04-01 ENCOUNTER — Other Ambulatory Visit (HOSPITAL_COMMUNITY): Payer: Self-pay

## 2023-04-02 ENCOUNTER — Telehealth: Payer: Self-pay | Admitting: *Deleted

## 2023-04-02 NOTE — Telephone Encounter (Signed)
Call from patient states is still having the Acid Reflux is currently on a medication for that does not seem to be working.  Patient is on Metformin as well thinks that might be contributing to her problem.  Patient is having some vomiting as well some days.   Has a temperature if 99.  Is also having some diarrhea.  Currently does not have insurance as her [employer] Letter has expired.  Patient was informed that there are no available appointments today. Refused to go to an Urgent Care.  Will await an appointment.  Patient states that she and her doctor discussed this problem at her last visit.

## 2023-04-13 ENCOUNTER — Encounter: Payer: Self-pay | Admitting: Student

## 2023-04-13 ENCOUNTER — Other Ambulatory Visit: Payer: Self-pay

## 2023-04-13 ENCOUNTER — Ambulatory Visit: Payer: Self-pay | Admitting: Student

## 2023-04-13 VITALS — BP 139/70 | HR 73 | Temp 97.8°F | Ht 68.0 in | Wt 214.6 lb

## 2023-04-13 DIAGNOSIS — K219 Gastro-esophageal reflux disease without esophagitis: Secondary | ICD-10-CM

## 2023-04-13 DIAGNOSIS — E1121 Type 2 diabetes mellitus with diabetic nephropathy: Secondary | ICD-10-CM

## 2023-04-13 DIAGNOSIS — Z7984 Long term (current) use of oral hypoglycemic drugs: Secondary | ICD-10-CM

## 2023-04-13 DIAGNOSIS — Z794 Long term (current) use of insulin: Secondary | ICD-10-CM

## 2023-04-13 MED ORDER — SEMAGLUTIDE(0.25 OR 0.5MG/DOS) 2 MG/3ML ~~LOC~~ SOPN: 0.2500 mg | PEN_INJECTOR | SUBCUTANEOUS | Status: DC

## 2023-04-13 MED ORDER — FAMOTIDINE 20 MG PO TABS: 20.0000 mg | ORAL_TABLET | Freq: Every day | ORAL | 1 refills | Status: DC

## 2023-04-13 NOTE — Assessment & Plan Note (Signed)
Patient continues to have acid reflux symptoms despite pantoprazole 40 mg twice daily.  She continues to have significant amount of belching as well as dysphagia.  Seems that her dysphagia is longstanding, and is mostly to solid foods.  At times she regurgitates food.  She has lost about 10 pounds in the last year.  She had a prior EGD and biopsies in September 2023 which showed some inflammation, was negative for Barrett's esophagus or H. pylori.  She is a patient with [facility], and is scheduled to see them in the very near future.  Plan: Initiate famotidine 20 mg nightly GI follow-up given ongoing GERD and dysphagia.

## 2023-04-13 NOTE — Assessment & Plan Note (Addendum)
Patient has a history of relatively well-controlled diabetes.  She was previously on basal coverage and prandial insulin in addition to Januvia metformin combination pill and SGLT2.  Her prandial insulin was discontinued, sadly her A1c increased following this change.  Prandial coverage was resumed, and she brings in a log book of her recent blood glucose which appears to be relatively well-controlled.  She is on Januvia and metformin combination pill.  At past visits there have been discussions regarding switching her to a GLP-1 for weight loss / renal protection.  This was again discussed with the patient, we will begin process of trying to obtain patient assistance for Ozempic.  Plan: Continue current medical regimen for now Orders placed for Ozempic, reach out to clinical pharmacist for help setting up patient assistance program. When patient receives Ozempic, we will transition her from metformin / Januvia combination pill to solitary metformin. This was discussed with the patient, patient voiced understanding of the fact that she should not begin Ozempic while still on Januvia, and should call our office for corrected prescriptions.

## 2023-04-13 NOTE — Progress Notes (Deleted)
Note created in error.

## 2023-04-13 NOTE — Progress Notes (Unsigned)
Subjective:  CC: GERD, diabetes  HPI:  [name] is a 63 y.o. person last seen 02/25/2023 who is presenting for follow-up regarding her diabetes and GERD.    For please see problem based assessment and plan for additional details.  Past Medical History:  Diagnosis Date   Chronic kidney disease (CKD), stage III (moderate) (HCC)    DIABETES MELLITUS, TYPE II 06/02/2006   Last HBA1C 8.7. , check 3 times daily   Epidermal inclusion cyst 03/23/2020   Hematoma of right breast 04/10/2021   Hydronephrosis    sees Dr Malen Gauze at [facility] ,   HYPERLIPIDEMIA 06/02/2006   Qualifier: Diagnosis of  By: Noel Gerold MD, Christiane Ha     HYPERTENSION 06/02/2006   Filed Vitals:   01/21/11 1134  BP: 162/98  Pulse: 80  Temp: 97.4 F (36.3 C)  TempSrc: Oral  Resp: 20  Height: 5\' 8"  (1.727 m)  Weight: 261 lb (118.389 kg)      Persistent proteinuria 01/21/2018   Personal history of colonic polyps 11/09/2018    Current Outpatient Medications on File Prior to Visit  Medication Sig Dispense Refill   acetaminophen (TYLENOL) 500 MG tablet Take 500 mg by mouth every 6 (six) hours as needed for moderate pain.     amlodipine-olmesartan (AZOR) 10-20 MG tablet Take 1 tablet by mouth daily 90 tablet 3   ascorbic acid (VITAMIN C) 500 MG tablet Take 500 mg by mouth daily.     ASPIRIN LOW DOSE 81 MG tablet TAKE 1 Tablet BY MOUTH ONCE EVERY DAY 90 tablet 3   atorvastatin (LIPITOR) 40 MG tablet TAKE 1 Tablet BY MOUTH ONCE EVERY DAY 90 tablet 3   Blood Glucose Monitoring Suppl (WAVESENSE PRESTO) W/DEVICE KIT 1 each by Does not apply route 2 (two) times daily. 1 each 0   carvedilol (COREG) 25 MG tablet TAKE 1 Tablet  BY MOUTH TWICE DAILY 180 tablet 3   chlorthalidone (HYGROTON) 25 MG tablet Take 1 tablet (25 mg total) by mouth daily 90 tablet 2   cholecalciferol (VITAMIN D3) 25 MCG (1000 UNIT) tablet Take 1 tablet (1,000 Units total) by mouth 3 (three) times a week. 36 tablet 3   glucose blood (WAVESENSE  PRESTO) test strip Use to test blood sugar 3 times daily. diag code E11.9. Insulin dependent 100 each 11   Insulin Glargine (BASAGLAR KWIKPEN) 100 UNIT/ML Inject 26 Units into the skin at bedtime. 15 mL 5   insulin lispro (HUMALOG KWIKPEN) 100 UNIT/ML KwikPen INJECT 6 UNITS UNDER THE SKIN BEFORE BREAKFAST AND LUNCH AND 4 UNITS BEFORE DINNER. 15 mL 5   Insulin Pen Needle (PEN NEEDLES) 31G X 5 MM MISC 4 Doses/Fill by Does not apply route daily. 100 each 11   Insulin Syringe-Needle U-100 31G X 15/64" 0.3 ML MISC 1 each by Does not apply route 2 (two) times daily with a meal. 100 each 11   INVOKANA 100 MG TABS tablet Take 100 mg by mouth daily.     Lancets 30G MISC 1 each by Does not apply route 2 (two) times daily. 100 each 12   magnesium oxide (MAG-OX) 400 MG tablet Take 400 mg by mouth daily.     pantoprazole (PROTONIX) 40 MG tablet Take 1 tablet (40 mg total) by mouth 2 (two) times daily. 60 tablet 11   Polyethyl Glycol-Propyl Glycol (SYSTANE OP) Place 1 drop into both eyes daily as needed (irritation).     polyethylene glycol (MIRALAX / GLYCOLAX) 17 g packet Take  17 g by mouth daily as needed for moderate constipation.     sitaGLIPtin-metformin (JANUMET) 50-1000 MG tablet Take 1 tablet by mouth 2 (two) times daily with a meal. 60 tablet 11   valACYclovir (VALTREX) 500 MG tablet Take 500 mg by mouth 2 (two) times daily as needed (fever blisters).     No current facility-administered medications on file prior to visit.    Review of Systems: Please see assessment and plan for pertinent positives and negatives.  Objective:   Vitals:   04/13/23 1525  BP: 139/70  Pulse: 73  Temp: 97.8 F (36.6 C)  TempSrc: Oral  SpO2: 100%  Weight: 214 lb 9.6 oz (97.3 kg)  Height: 5\' 8"  (1.727 m)    Physical Exam: Constitutional: Well-appearing Cardiovascular: Regular rate and rhythm Pulmonary/Chest: lungs clear to auscultation bilaterally Abdominal: soft, non-tender, non-distended Extremities: No  edema of the lower extremities bilaterally Psych: Pleasant affect Thought process is linear and is goal-directed.     Assessment & Plan:  GERD (gastroesophageal reflux disease) Patient continues to have acid reflux symptoms despite pantoprazole 40 mg twice daily.  She continues to have significant amount of belching as well as dysphagia.  Seems that her dysphagia is longstanding, and is mostly to solid foods.  At times she regurgitates food.  She has lost about 10 pounds in the last year.  She had a prior EGD and biopsies in September 2023 which showed some inflammation, was negative for Barrett's esophagus or H. pylori.  She is a patient with [facility], and is scheduled to see them in the very near future.  Plan: Initiate famotidine 20 mg nightly GI follow-up given ongoing GERD and dysphagia.  Diabetes mellitus with diabetic nephropathy [facility]) Patient has a history of relatively well-controlled diabetes.  She was previously on basal coverage and prandial insulin in addition to Januvia metformin combination pill and SGLT2.  Her prandial insulin was discontinued, sadly her A1c increased following this change.  Prandial coverage was resumed, and she brings in a log book of her recent blood glucose which appears to be relatively well-controlled.  She is on Januvia and metformin combination pill.  At past visits there have been discussions regarding switching her to a GLP-1 for weight loss / renal protection.  This was again discussed with the patient, we will begin process of trying to obtain patient assistance for Ozempic.  Plan: Continue current medical regimen for now Orders placed for Ozempic, reach out to clinical pharmacist for help setting up patient assistance program. When patient receives Ozempic, we will transition her from metformin / Januvia combination pill to solitary metformin. This was discussed with the patient, patient voiced understanding of the fact that she should not begin  Ozempic while still on Januvia, and should call our office for corrected prescriptions.    Patient discussed with Dr. Willow Ora MD [facility]  PGY-1 Pager: [phone]  Phone: [phone] Date 04/13/2023  Time 11:31 PM

## 2023-04-13 NOTE — Patient Instructions (Addendum)
Thank you, [name] for allowing [place] to provide your care today.  I will call [employer] and see if they will cover you new mediations. We will add Famotidine for your acid reflux and I will call you to discuss starting Ozempic. I will give you a call about this soon.   Follow up: 2-3 months for diabetes follow up   We look forward to seeing you next time. Please call our clinic at [phone] if you have any questions or concerns. The best time to call is Monday-Friday from 9am-4pm, but there is someone available 24/7. If after hours or the weekend, call the main hospital number and ask for the Internal Medicine Resident On-Call. If you need medication refills, please notify your pharmacy one week in advance and they will send [place] a request.   Thank you for trusting me with your care. Wishing you the best!  Lovie Macadamia MD [facility]

## 2023-04-16 ENCOUNTER — Other Ambulatory Visit (HOSPITAL_COMMUNITY): Payer: Self-pay

## 2023-04-16 ENCOUNTER — Telehealth: Payer: Self-pay

## 2023-04-16 NOTE — Progress Notes (Signed)
 Pharmacy Medication Assistance Program Note    05/18/2023  Patient ID: Ashley Long, female   DOB: Oct 01, 1960, 63 y.o.   MRN: [id number]     04/16/2023  Outreach Medication One  Manufacturer Medication One [employer] Ozempic  Dose of Ozempic 0.25mg /0.5mg   Type of Radiographer, therapeutic Assistance  Date Application Received From Provider 05/13/2023  Date Application Submitted to Manufacturer 04/16/2023  Method Application Sent to Manufacturer Online  Patient Assistance Determination Approved  Approval Start Date 05/15/2023  Approval End Date 05/08/2024    Shipment should arrive in 10-14 business days. If not received at office by then, voucher will need to be requested from [employer].

## 2023-04-16 NOTE — Telephone Encounter (Signed)
-----   Message from Morene Crocker sent at 04/03/2023 12:53 PM EST ----- Regarding: FW: [employer] patient needing GLP-1 receptor agonist Ok. Ms. Gillott will be seen in the clinic at the end of the month.   Dr. Rosaura Carpenter - could you start the patient on GLP1 (wegovy or ozempic) at [facility] pharmacy and to stop her DDP4 inhibitor. That way, Ms. Durward Mallard can start her application for the assistance program.  Thank you so much!  Byrd Hesselbach ----- Message ----- From: Otho Najjar, CPhT Sent: 03/26/2023  10:49 AM EST To: Morene Crocker, MD Subject: RE: [employer] patient needing GLP-1 receptor#  Happy New Year!   No assistance has been started... waiting to her know which med you wanted to go with.   I can mail the [employer] application to patients home. You can add it to her med list so I'll know what its written for :) ----- Message ----- From: Morene Crocker, MD Sent: 03/26/2023   9:07 AM EST To: Otho Najjar, CPhT Subject: RE: [employer] patient needing GLP-1 receptor#  Good morning and happy new year!  Have we been able to start an application on this patient for the Ozempic? Or should I order it first?  Thank you, Byrd Hesselbach ----- Message ----- From: Otho Najjar, CPhT Sent: 02/26/2023   1:06 PM EST To: Morene Crocker, MD Subject: RE: [employer] patient needing GLP-1 receptor#  At this time of year, Julious Oka is approving within a week once approved (however its free at the pharmacy).  [employer] takes about 3+ weeks for approval, and another 3 for the med to ship to the office. ----- Message ----- From: Morene Crocker, MD Sent: 02/26/2023  11:15 AM EST To: Otho Najjar, CPhT Subject: RE: [employer] patient needing GLP-1 receptor#  What is the usual turn around time? ----- Message ----- From: Otho Najjar, CPhT Sent: 02/26/2023  10:04 AM EST To: Morene Crocker, MD Subject: RE: [employer] patient needing GLP-1  receptor#  There are currently no assistance program for [facility]. She should be eligible for assistance for ozempic with [employer], or Trulicity with [employer] (they have a quicker turn around time).   If she needs it quickly, [facility] pharmacy has trulicity available at no charge under their DOH program (free meds). ----- Message ----- From: Morene Crocker, MD Sent: 02/26/2023   9:30 AM EST To: Otho Najjar, CPhT; # Subject: [employer] patient needing GLP-1 receptor ago#  Hi Durward Mallard, This patient is currently on a DDP 4 inhibitor and I want to switch her over to a GLP-1 inhibitor.  She is currently receiving medications through [employer] and she has the orange card.  I do not know how she gets her SGLT2 inhibitor and other medications covered or through which manufacturer.  Ideally I like to start her on Mounjaro, but is not any preference on manufacturer that could be a little bit faster in delivering the GLP-1 receptor agonist?  I would like to start the application for her as soon as possible since it would help her A1c and her weight.  Any thoughts? Thank you for your help always, Byrd Hesselbach

## 2023-04-18 NOTE — Progress Notes (Signed)
 Internal Medicine Clinic Attending  Case discussed with the resident at the time of the visit.  We reviewed the resident's history and exam and pertinent patient test results.  I agree with the assessment, diagnosis, and plan of care documented in the resident's note.

## 2023-04-29 ENCOUNTER — Other Ambulatory Visit (HOSPITAL_COMMUNITY): Payer: Self-pay

## 2023-05-21 ENCOUNTER — Telehealth: Payer: Self-pay | Admitting: *Deleted

## 2023-05-21 NOTE — Telephone Encounter (Signed)
 Copied from CRM [id number]. Topic: General - Call Back - No Documentation >> May 21, 2023  3:20 PM Corin V wrote: Reason for CRM: Patient requesting a call back from [place] regarding paperwork for a medication from [employer]. She stated she still has not received anything from [employer]

## 2023-05-22 NOTE — Telephone Encounter (Signed)
 Call to [facility] to let her know that her application was approved last week and it takes 3-4 weeks for delivery of the medication.  Durward Mallard will reach out to her soon.

## 2023-05-25 ENCOUNTER — Other Ambulatory Visit: Payer: Self-pay | Admitting: Student

## 2023-05-25 DIAGNOSIS — E119 Type 2 diabetes mellitus without complications: Secondary | ICD-10-CM

## 2023-05-25 NOTE — Telephone Encounter (Signed)
 Medication sent to pharmacy

## 2023-05-26 ENCOUNTER — Ambulatory Visit: Payer: Self-pay | Admitting: Internal Medicine

## 2023-05-26 VITALS — BP 122/80 | HR 78 | Temp 98.2°F | Ht 68.0 in | Wt 210.1 lb

## 2023-05-26 DIAGNOSIS — K219 Gastro-esophageal reflux disease without esophagitis: Secondary | ICD-10-CM

## 2023-05-26 DIAGNOSIS — E1121 Type 2 diabetes mellitus with diabetic nephropathy: Secondary | ICD-10-CM

## 2023-05-26 DIAGNOSIS — Z794 Long term (current) use of insulin: Secondary | ICD-10-CM

## 2023-05-26 DIAGNOSIS — Z7984 Long term (current) use of oral hypoglycemic drugs: Secondary | ICD-10-CM

## 2023-05-26 LAB — POCT GLYCOSYLATED HEMOGLOBIN (HGB A1C): Hemoglobin A1C: 7.7 % — AB (ref 4.0–5.6)

## 2023-05-26 LAB — GLUCOSE, CAPILLARY: Glucose-Capillary: 142 mg/dL — ABNORMAL HIGH (ref 70–99)

## 2023-05-26 MED ORDER — FAMOTIDINE 20 MG PO TABS: 20.0000 mg | ORAL_TABLET | Freq: Every day | ORAL | 1 refills | Status: DC

## 2023-05-26 NOTE — Assessment & Plan Note (Signed)
 A1c improved from 8->7.7 today.  She has lost about 5 pounds since the end of January with diet and exercise.  Medications include Humalog 5 units with breakfast and lunch and 4 units with dinner, Janumet 50-1000 mg twice daily, Invokana 100 mg daily, and Lantus 26 units nightly.  She uses a glucometer to check her blood sugars.  She has not had any low blood sugars.  She denies polyuria and polydipsia.  At last visit it was discussed with her to start GLP-1 as application had been completed through pharmaceutical company for this.  She would prefer staying on current medications for diabetes and continue to work on diet and exercise to achieve weight loss goals.  P: Continue Humalog with meals Continue Lantus 26 units nightly, could consider titrating up if A1c remains uncontrolled but did not do this today as this could make it harder for her to lose weight Continue Janumet 50-1000 mg twice daily Continue Invokana 100 mg, previously nephrology has been hesitant to increase dose of this. Follow-up in 3 months for repeat A1c  Sent to PCP and Estanislado Spire as patient is not interested in starting GLP-1 as previously planned.

## 2023-05-26 NOTE — Patient Instructions (Signed)
 Thank you, [name] for allowing [place] to provide your care today.   Diabetes Your A1c looks better today at 7.7. The goal is to get this less than 7. Please continue your medications as you have been taking them. The main intervention is you continuing to do the great work you have been with diet and exercise. Keep up the good work!  Acid reflux Please continue taking pantoprazole 40 mg 2 times daily. Start taking famotidine 20 mg daily at bedtime.  I have ordered the following labs for you:  Lab Orders         Glucose, capillary         POC Hbg A1C      I have ordered the following medication/changed the following medications:   Stop the following medications: Medications Discontinued During This Encounter  Medication Reason   Semaglutide,0.25 or 0.5MG /DOS, 2 MG/3ML SOPN Non-compliance   famotidine (PEPCID) 20 MG tablet Reorder     Start the following medications: Meds ordered this encounter  Medications   famotidine (PEPCID) 20 MG tablet    Sig: Take 1 tablet (20 mg total) by mouth at bedtime.    Dispense:  60 tablet    Refill:  1     Follow up:  3 months   We look forward to seeing you next time. Please call our clinic at [phone] if you have any questions or concerns. The best time to call is Monday-Friday from 9am-4pm, but there is someone available 24/7. If after hours or the weekend, call the main hospital number and ask for the Internal Medicine Resident On-Call. If you need medication refills, please notify your pharmacy one week in advance and they will send [place] a request.   Thank you for trusting me with your care. Wishing you the best!   Rudene Christians, DO [facility]

## 2023-05-26 NOTE — Assessment & Plan Note (Signed)
 Acid reflux remains uncontrolled on pantoprazole 40 mg twice daily.  She saw GI yesterday who plan to start famotidine in addition to PPI.  They do not want to pursue endoscopy at this time.  P: Continue pantoprazole 40 mg twice daily Start famotidine nightly Follow-up in 4 weeks if symptoms are not improved otherwise follow-up in 3 months for diabetes

## 2023-05-26 NOTE — Progress Notes (Signed)
 Subjective:  CC: diabetes  HPI:  [name] is a 63 y.o. female with a past medical history of 52s and acid reflux who presents today for chronic conditions.  Last office visit was in January of this year.  Visit focused on acid reflux.  She was continuing to have acid reflux systems despite pantoprazole 40 mg twice daily dosing.  He is lost about 10 pounds in the last year.  She has some dysphagia that is longstanding to solid foods.  Referred to GI and started on famotidine.  Please see problem based assessment and plan for additional details.  Past Medical History:  Diagnosis Date   Chronic kidney disease (CKD), stage III (moderate) (HCC)    DIABETES MELLITUS, TYPE II 06/02/2006   Last HBA1C 8.7. , check 3 times daily   Epidermal inclusion cyst 03/23/2020   Hematoma of right breast 04/10/2021   Hydronephrosis    sees Dr Malen Gauze at [facility] ,   HYPERLIPIDEMIA 06/02/2006   Qualifier: Diagnosis of  By: Noel Gerold MD, Christiane Ha     HYPERTENSION 06/02/2006   Filed Vitals:   01/21/11 1134  BP: 162/98  Pulse: 80  Temp: 97.4 F (36.3 C)  TempSrc: Oral  Resp: 20  Height: 5\' 8"  (1.727 m)  Weight: 261 lb (118.389 kg)      Persistent proteinuria 01/21/2018   Personal history of colonic polyps 11/09/2018    MEDICATIONS:  Aspirin 81 mg Lipitor 40 mg Humalog 6 units breakfast and lunch and 4 units at dinner Ozempic 0.25 mg weekly Janumet 50-1000 mg twice daily Amlodipine olmesartan 10-20 mg invokana Pepcid 20 mg daily Carvedilol 25 mg twice daily Chlorthalidone 25 mg Glargine 26 units Pantoprazole 40 mg twice daily  Family History  Problem Relation Age of Onset   Diabetes Mother    Other Neg Hx    Breast cancer Neg Hx     Past Surgical History:  Procedure Laterality Date   BIOPSY  11/09/2018   Procedure: BIOPSY;  Surgeon: Charlott Rakes, MD;  Location: [facility] ENDOSCOPY;  Service: Endoscopy;;   BIOPSY  11/19/2021   Procedure: BIOPSY;  Surgeon: Charlott Rakes,  MD;  Location: [facility];  Service: Gastroenterology;;   COLONOSCOPY N/A 03/29/2013   Procedure: COLONOSCOPY;  Surgeon: Terrence Friar, MD;  Location: [facility] ENDOSCOPY;  Service: Endoscopy;  Laterality: N/A;   COLONOSCOPY WITH PROPOFOL N/A 11/09/2018   Procedure: COLONOSCOPY WITH PROPOFOL;  Surgeon: Charlott Rakes, MD;  Location: [facility] ENDOSCOPY;  Service: Endoscopy;  Laterality: N/A;   ESOPHAGOGASTRODUODENOSCOPY (EGD) WITH PROPOFOL N/A 11/19/2021   Procedure: ESOPHAGOGASTRODUODENOSCOPY (EGD) WITH PROPOFOL;  Surgeon: Charlott Rakes, MD;  Location: [facility] ENDOSCOPY;  Service: Gastroenterology;  Laterality: N/A;   TUBAL LIGATION       Social History   Socioeconomic History   Marital status: Single    Spouse name: Not on file   Number of children: 1   Years of education: 12   Highest education level: High school graduate  Occupational History   Occupation: Lobbyist: [employer]  Tobacco Use   Smoking status: Former    Current packs/day: 0.00    Types: Cigarettes    Quit date: 10/15/1982    Years since quitting: 40.6   Smokeless tobacco: Never  Vaping Use   Vaping status: Never Used  Substance and Sexual Activity   Alcohol use: No    Alcohol/week: 0.0 standard drinks of alcohol   Drug use: No   Sexual activity: Not Currently  Birth control/protection: Post-menopausal  Other Topics Concern   Not on file  Social History Narrative   Financial assistance approved for 100% discount at [facility] and has [facility] card per [employer]   02/12/10         Social Drivers of Health   Financial Resource Strain: Low Risk  (03/25/2022)   Overall Financial Resource Strain (CARDIA)    Difficulty of Paying Living Expenses: Not hard at all  Food Insecurity: No Food Insecurity (03/03/2023)   Hunger Vital Sign    Worried About Running Out of Food in the Last Year: Never true    Ran Out of Food in the Last Year: Never true  Transportation Needs: No Transportation Needs (03/03/2023)    PRAPARE - Administrator, Civil Service (Medical): No    Lack of Transportation (Non-Medical): No  Physical Activity: Not on file  Stress: Not on file  Social Connections: Moderately Isolated (03/25/2022)   Social Connection and Isolation Panel [NHANES]    Frequency of Communication with Friends and Family: Twice a week    Frequency of Social Gatherings with Friends and Family: Once a week    Attends Religious Services: 1 to 4 times per year    Active Member of [employer] or Organizations: No    Attends Banker Meetings: Never    Marital Status: Never married  Intimate Partner Violence: Not At Risk (12/01/2022)   Humiliation, Afraid, Rape, and Kick questionnaire    Fear of Current or Ex-Partner: No    Emotionally Abused: No    Physically Abused: No    Sexually Abused: No    Review of Systems: ROS negative except for what is noted on the assessment and plan.  Objective:   Vitals:   05/26/23 1007  BP: 122/80  Pulse: 78  Temp: 98.2 F (36.8 C)  TempSrc: Oral  SpO2: 100%  Weight: 210 lb 1.6 oz (95.3 kg)  Height: 5\' 8"  (1.727 m)    Physical Exam: Constitutional: well-appearing, in no acute distress Cardiovascular: regular rate and rhythm, no m/r/g Pulmonary/Chest: normal work of breathing on room air, lungs clear to auscultation bilaterally Abdominal: soft, non-tender, non-distended MSK: normal bulk and tone Skin: warm and dry  Assessment & Plan:  Diabetes mellitus with diabetic nephropathy (HCC) A1c improved from 8->7.7 today.  She has lost about 5 pounds since the end of January with diet and exercise.  Medications include Humalog 5 units with breakfast and lunch and 4 units with dinner, Janumet 50-1000 mg twice daily, Invokana 100 mg daily, and Lantus 26 units nightly.  She uses a glucometer to check her blood sugars.  She has not had any low blood sugars.  She denies polyuria and polydipsia.  At last visit it was discussed with her to start GLP-1 as  application had been completed through pharmaceutical company for this.  She would prefer staying on current medications for diabetes and continue to work on diet and exercise to achieve weight loss goals.  P: Continue Humalog with meals Continue Lantus 26 units nightly, could consider titrating up if A1c remains uncontrolled but did not do this today as this could make it harder for her to lose weight Continue Janumet 50-1000 mg twice daily Continue Invokana 100 mg, previously nephrology has been hesitant to increase dose of this. Follow-up in 3 months for repeat A1c  Sent to PCP and Estanislado Spire as patient is not interested in starting GLP-1 as previously planned.  GERD (gastroesophageal reflux disease) Acid  reflux remains uncontrolled on pantoprazole 40 mg twice daily.  She saw GI yesterday who plan to start famotidine in addition to PPI.  They do not want to pursue endoscopy at this time.  P: Continue pantoprazole 40 mg twice daily Start famotidine nightly Follow-up in 4 weeks if symptoms are not improved otherwise follow-up in 3 months for diabetes   Patient discussed with Dr. Wille Celeste Tareq Dwan, D.O. [facility]  PGY-3 Pager: [phone]  Phone: [phone] Date 05/26/2023  Time 4:25 PM

## 2023-05-28 ENCOUNTER — Other Ambulatory Visit (HOSPITAL_COMMUNITY): Payer: Self-pay

## 2023-05-28 NOTE — Progress Notes (Signed)
 Internal Medicine Clinic Attending  Case discussed with the resident at the time of the visit.  We reviewed the resident's history and exam and pertinent patient test results.  I agree with the assessment, diagnosis, and plan of care documented in the resident's note.

## 2023-05-29 NOTE — Telephone Encounter (Signed)
 No refills available due to dose written. No auto refill. If needing to continue, will need to send refill form.

## 2023-06-23 ENCOUNTER — Other Ambulatory Visit: Payer: Self-pay | Admitting: Nephrology

## 2023-06-23 DIAGNOSIS — N1832 Chronic kidney disease, stage 3b: Secondary | ICD-10-CM

## 2023-06-29 ENCOUNTER — Other Ambulatory Visit (HOSPITAL_COMMUNITY): Payer: Self-pay

## 2023-06-30 ENCOUNTER — Other Ambulatory Visit (HOSPITAL_COMMUNITY): Payer: Self-pay

## 2023-07-02 ENCOUNTER — Ambulatory Visit
Admission: RE | Admit: 2023-07-02 | Discharge: 2023-07-02 | Disposition: A | Discharge status: Home / Self Care | Source: Ambulatory Visit | Attending: Nephrology | Admitting: Nephrology

## 2023-07-02 DIAGNOSIS — N1832 Chronic kidney disease, stage 3b: Secondary | ICD-10-CM

## 2023-07-09 ENCOUNTER — Telehealth: Payer: Self-pay | Admitting: Student

## 2023-07-09 NOTE — Telephone Encounter (Signed)
 LMOM unable to schedule until the July Sch has been posted for her 3 month f/u appt.  Copied from CRM [id number]. Topic: Appointments - Scheduling Inquiry for Clinic >> Jul 08, 2023  2:58 PM Brynn Caras wrote: Reason for CRM: The patient is requesting to schedule her 3 month follow-up with Dr.Gomez only, she will need to schedule this in the morning for any weekday. Unable to schedule OV due to "All solutions starting on 07/08/2023 to 07/08/2024 have been loaded. To search for more solutions, change the search start date."  Callback [phone]

## 2023-07-20 NOTE — Telephone Encounter (Signed)
 Called patient and no answer Please see Previous messages as her PCP's Sch is not posted for July yet.  Message prev states on 07/09/2023 when she called the sch is not ready.  Copied from CRM [id number]. Topic: Appointments - Scheduling Inquiry for Clinic >> Jul 20, 2023  8:34 AM Ashley Long wrote: Patient has stated she needed a 96-month follow with Dr. Fabiola Holy. I offered her a visit with the other providers she stated she would normally see someone else but she prefers her provider. Patient is needing an appt in June for mornings. Please call patient back at [phone]

## 2023-07-26 ENCOUNTER — Other Ambulatory Visit (HOSPITAL_COMMUNITY): Payer: Self-pay

## 2023-08-17 ENCOUNTER — Other Ambulatory Visit: Payer: Self-pay | Admitting: Student

## 2023-08-17 DIAGNOSIS — E119 Type 2 diabetes mellitus without complications: Secondary | ICD-10-CM

## 2023-08-29 ENCOUNTER — Other Ambulatory Visit: Payer: Self-pay | Admitting: Internal Medicine

## 2023-08-29 DIAGNOSIS — K219 Gastro-esophageal reflux disease without esophagitis: Secondary | ICD-10-CM

## 2023-08-29 DIAGNOSIS — I1A0 Resistant hypertension: Secondary | ICD-10-CM

## 2023-08-31 ENCOUNTER — Other Ambulatory Visit (HOSPITAL_COMMUNITY): Payer: Self-pay

## 2023-08-31 MED ORDER — AMLODIPINE-OLMESARTAN 10-20 MG PO TABS
1.0000 | ORAL_TABLET | Freq: Every day | ORAL | 3 refills | Status: AC
  Filled 2023-08-31: qty 30, 30d supply, fill #0
  Filled 2023-09-29: qty 30, 30d supply, fill #1
  Filled 2023-10-28 (×4): qty 30, 30d supply, fill #2
  Filled 2023-11-24: qty 30, 30d supply, fill #3
  Filled 2023-12-28: qty 30, 30d supply, fill #4
  Filled 2024-02-02: qty 30, 30d supply, fill #5
  Filled 2024-02-28: qty 30, 30d supply, fill #6
  Filled 2024-03-29: qty 30, 30d supply, fill #7

## 2023-08-31 MED ORDER — PANTOPRAZOLE SODIUM 40 MG PO TBEC
40.0000 mg | DELAYED_RELEASE_TABLET | Freq: Two times a day (BID) | ORAL | 11 refills | Status: DC
  Filled 2023-08-31: qty 60, 30d supply, fill #0
  Filled 2023-09-29: qty 60, 30d supply, fill #1
  Filled 2023-10-28 (×2): qty 60, 30d supply, fill #2
  Filled 2023-11-24: qty 60, 30d supply, fill #3
  Filled 2023-12-28: qty 60, 30d supply, fill #4

## 2023-08-31 NOTE — Telephone Encounter (Signed)
 Medication sent to pharmacy

## 2023-09-01 ENCOUNTER — Ambulatory Visit: Payer: Self-pay | Admitting: Student

## 2023-09-01 ENCOUNTER — Other Ambulatory Visit: Payer: Self-pay

## 2023-09-01 ENCOUNTER — Other Ambulatory Visit (HOSPITAL_COMMUNITY): Payer: Self-pay

## 2023-09-01 VITALS — BP 149/89 | HR 74 | Temp 98.7°F | Ht 68.0 in | Wt 210.6 lb

## 2023-09-01 DIAGNOSIS — Z794 Long term (current) use of insulin: Secondary | ICD-10-CM

## 2023-09-01 DIAGNOSIS — E1121 Type 2 diabetes mellitus with diabetic nephropathy: Secondary | ICD-10-CM

## 2023-09-01 DIAGNOSIS — I1A0 Resistant hypertension: Secondary | ICD-10-CM

## 2023-09-01 DIAGNOSIS — Z7985 Long-term (current) use of injectable non-insulin antidiabetic drugs: Secondary | ICD-10-CM

## 2023-09-01 LAB — POCT GLYCOSYLATED HEMOGLOBIN (HGB A1C): Hemoglobin A1C: 8 % — AB (ref 4.0–5.6)

## 2023-09-01 LAB — GLUCOSE, CAPILLARY: Glucose-Capillary: 107 mg/dL — ABNORMAL HIGH (ref 70–99)

## 2023-09-01 MED ORDER — METFORMIN HCL 1000 MG PO TABS
1000.0000 mg | ORAL_TABLET | Freq: Two times a day (BID) | ORAL | 2 refills | Status: DC
  Filled 2023-09-01: qty 60, 30d supply, fill #0

## 2023-09-01 MED ORDER — SEMAGLUTIDE(0.25 OR 0.5MG/DOS) 2 MG/3ML ~~LOC~~ SOPN
PEN_INJECTOR | SUBCUTANEOUS | 1 refills | Status: AC
  Filled 2023-09-01: qty 3, fill #0

## 2023-09-01 NOTE — Patient Instructions (Signed)
  Thank you, [name], for allowing us  to provide your care today. Today we discussed . . .  > Diabetes       - I am glad that your blood sugars have stayed under good control and that you have not had any significantly low blood sugars.  I want you to continue taking your insulin  as you have been as well as your Invokana  but we will change to Janumet  for metformin  1000 mg twice a day and add Ozempic  0.25 mg every week for 4 weeks and then increase to 0.5 mg weekly for 4 weeks.  If you are not having significant side effect such as nausea, vomiting, or upset stomach we will increase to 1 mg weekly after that.  I have sent these medications to the pharmacy and you can start taking them when you pick them up.  I will talk to Dr. Garald Jumbo and Dr. Yvonnie Heritage as well but the Ozempic  is safe to take in kidney disease so I would recommend that you do not wait until Dr. Yvonnie Heritage messages me back to start this.  We will see you back in 1 month to recheck your A1c. > Blood pressure       - Your blood pressures at home look great so we will continue your medications without any changes.  We are going to check your electrolytes and kidney function today and I will call you if there are any changes we need to make to your medication based on this.  I have ordered the following labs for you:   Lab Orders         Microalbumin / Creatinine Urine Ratio         BMP8+Anion Gap         Glucose, capillary         POC Hbg A1C       I have ordered the following medication/changed the following medications:   Stop the following medications: Medications Discontinued During This Encounter  Medication Reason   JANUMET  50-1000 MG tablet      Start the following medications: Meds ordered this encounter  Medications   Semaglutide ,0.25 or 0.5MG /DOS, 2 MG/3ML SOPN    Sig: Inject 0.25 mg into the skin once a week for 28 days, THEN 0.5 mg once a week for 28 days.    Dispense:  3 mL    Refill:  1    metFORMIN  (GLUCOPHAGE ) 1000 MG tablet    Sig: Take 1 tablet (1,000 mg total) by mouth 2 (two) times daily with a meal.    Dispense:  60 tablet    Refill:  2      Follow up: 3 months    Remember:     Should you have any questions or concerns please call the internal medicine clinic at [phone].     Ashley Dallas, DO [facility]

## 2023-09-01 NOTE — Progress Notes (Signed)
 CC: Routine Follow Up for hypertension and diabetes after last office visit 05/26/2023  HPI:  Ashley Long is a 63 y.o. female with pertinent PMH of HTN, T2DM with neuropathy, CKD stage IIIb, peripheral artery disease, and GERD who presents as above. Please see assessment and plan below for further details.  Current Outpatient Medications  Medication Instructions   acetaminophen (TYLENOL) 500 mg, Every 6 hours PRN   amlodipine -olmesartan  (AZOR ) 10-20 MG tablet Take 1 tablet by mouth daily   ascorbic acid (VITAMIN C) 500 mg, Daily   ASPIRIN  LOW DOSE 81 MG tablet TAKE 1 Tablet BY MOUTH ONCE EVERY DAY   atorvastatin  (LIPITOR) 40 MG tablet TAKE 1 Tablet BY MOUTH ONCE EVERY DAY   Blood Glucose Monitoring Suppl (WAVESENSE PRESTO) W/DEVICE KIT 1 each, Does not apply, 2 times daily   carvedilol  (COREG ) 25 mg, Oral, 2 times daily   chlorthalidone  (HYGROTON ) 25 MG tablet Take 1 tablet (25 mg total) by mouth daily   famotidine  (PEPCID ) 20 mg, Oral, Daily at bedtime   glucose blood (WAVESENSE PRESTO) test strip Use to test blood sugar 3 times daily. diag code E11.9. Insulin  dependent   Insulin  Glargine (BASAGLAR  KWIKPEN) 100 UNIT/ML INJECT 26 UNITS UNDER THE SKIN EVERY NIGHT AT BEDTIME   insulin  lispro (HUMALOG  KWIKPEN) 100 UNIT/ML KwikPen INJECT 5 UNITS UNDER THE SKIN BEFORE BREAKFAST AND LUNCH AND 4 UNITS BEFORE DINNER   Insulin  Pen Needle (PEN NEEDLES) 31G X 5 MM MISC 4 Doses/Fill, Does not apply, Daily   Insulin  Syringe-Needle U-100 31G X 15/64 0.3 ML MISC 1 each, Does not apply, 2 times daily with meals   Invokana  100 mg, Daily   Lancets 30G MISC 1 each, Does not apply, 2 times daily   magnesium oxide (MAG-OX) 400 mg, Daily   metFORMIN  (GLUCOPHAGE ) 1,000 mg, Oral, 2 times daily with meals   pantoprazole  (PROTONIX ) 40 mg, Oral, 2 times daily   Polyethyl Glycol-Propyl Glycol (SYSTANE OP) 1 drop, Daily PRN   polyethylene glycol (MIRALAX  / GLYCOLAX ) 17 g, Daily PRN   Semaglutide ,0.25 or  0.5MG /DOS, 2 MG/3ML SOPN Inject 0.25 mg into the skin once a week for 28 days, THEN 0.5 mg once a week for 28 days.   valACYclovir (VALTREX) 500 mg, 2 times daily PRN     Review of Systems:   Pertinent items noted in HPI and/or A&P.  Physical Exam:  Vitals:   09/01/23 1012  BP: (!) 149/89  Pulse: 74  Temp: 98.7 F (37.1 C)  TempSrc: Oral  SpO2: 96%  Weight: 210 lb 9.6 oz (95.5 kg)  Height: 5' 8 (1.727 m)    Constitutional: Well-appearing adult female. In no acute distress. HEENT: Normocephalic, atraumatic, Sclera non-icteric, PERRL, EOM intact Cardio:Regular rate and rhythm. 2+ bilateral radial and dorsalis pedis  pulses. Pulm:Clear to auscultation bilaterally. Normal work of breathing on room air. Abdomen: Soft, non-tender, non-distended, positive bowel sounds. ZOX:WRUEAVWU for extremity edema. Skin:Warm and dry. Neuro:Alert and oriented x3. No focal deficit noted. Psych:Pleasant mood and affect.   Assessment & Plan:   Resistant hypertension Blood pressure today in the office is 149/89.  She does bring in her home blood pressure cuff which reads slightly higher than her cuff.  Reviewing blood pressure measurements at home they are predominantly 120s over 80s. - Continue amlodipine /olmesartan  10/20 mg daily, chlorthalidone  25 mg daily, carvedilol  25 mg twice daily - BMP today  Diabetes mellitus with diabetic nephropathy (HCC) A1c stable today at 8.0.  Reviewed glucose log which shows  no significant morning lows and some blood sugars up in the 200s.  We discussed GLP-1 therapy again and she is interested in starting this.  Discussed risk, benefits, and alternatives.  Ultimate goal is to taper off of insulin  after ramping up the dose of her GLP-1.  She does get her combination Janumet  from [facility] and if she is able to tolerate GLP-1 then we can try to combine her metformin  and canagliflozin  if it would be covered similarly. - Continue Lantus  26 units nightly and Humalog   sliding scale with meals - Discontinue Janumet  and replace with metformin  1000 mg twice daily - Continue Invokana  100 mg daily - Start Ozempic  0.25 mg weekly for 4 weeks and then increase to 0.5 mg weekly - Urine microalbumin creatinine ratio today - Return in 3 months for repeat A1c and increase GLP-1/taper insulin     Patient discussed with Dr. Christain Courser  Ashley Dallas, DO [facility] Internal [facility] Resident PGY-2 Clinic Phone: [phone] Pager: [phone]

## 2023-09-02 LAB — BMP8+ANION GAP
Anion Gap: 22 mmol/L — ABNORMAL HIGH (ref 10.0–18.0)
BUN/Creatinine Ratio: 16 (ref 12–28)
BUN: 19 mg/dL (ref 8–27)
CO2: 18 mmol/L — ABNORMAL LOW (ref 20–29)
Calcium: 10 mg/dL (ref 8.7–10.3)
Chloride: 100 mmol/L (ref 96–106)
Creatinine, Ser: 1.18 mg/dL — ABNORMAL HIGH (ref 0.57–1.00)
Glucose: 94 mg/dL (ref 70–99)
Potassium: 4.1 mmol/L (ref 3.5–5.2)
Sodium: 140 mmol/L (ref 134–144)
eGFR: 53 mL/min/{1.73_m2} — ABNORMAL LOW (ref >59)

## 2023-09-02 LAB — MICROALBUMIN / CREATININE URINE RATIO
Creatinine, Urine: 78.6 mg/dL
Microalb/Creat Ratio: 52 mg/g{creat} — ABNORMAL HIGH (ref 0–29)
Microalbumin, Urine: 40.7 ug/mL

## 2023-09-02 NOTE — Assessment & Plan Note (Signed)
 Blood pressure today in the office is 149/89.  She does bring in her home blood pressure cuff which reads slightly higher than her cuff.  Reviewing blood pressure measurements at home they are predominantly 120s over 80s. - Continue amlodipine /olmesartan  10/20 mg daily, chlorthalidone  25 mg daily, carvedilol  25 mg twice daily - BMP today

## 2023-09-02 NOTE — Assessment & Plan Note (Addendum)
 A1c stable today at 8.0.  Reviewed glucose log which shows no significant morning lows and some blood sugars up in the 200s.  We discussed GLP-1 therapy again and she is interested in starting this.  Discussed risk, benefits, and alternatives.  Ultimate goal is to taper off of insulin  after ramping up the dose of her GLP-1.  She does get her combination Janumet  from [facility] and if she is able to tolerate GLP-1 then we can try to combine her metformin  and canagliflozin  if it would be covered similarly. - Continue Lantus  26 units nightly and Humalog  sliding scale with meals - Discontinue Janumet  and replace with metformin  1000 mg twice daily - Continue Invokana  100 mg daily - Start Ozempic  0.25 mg weekly for 4 weeks and then increase to 0.5 mg weekly - Urine microalbumin creatinine ratio today - Return in 3 months for repeat A1c and increase GLP-1/taper insulin 

## 2023-09-03 NOTE — Progress Notes (Signed)
 Internal Medicine Clinic Attending  Case discussed with the resident at the time of the visit.  We reviewed the resident's history and exam and pertinent patient test results.  I agree with the assessment, diagnosis, and plan of care documented in the resident's note.

## 2023-09-09 ENCOUNTER — Ambulatory Visit: Payer: Self-pay | Admitting: Student

## 2023-09-09 NOTE — Progress Notes (Signed)
 Unable to reach patient over the phone to discuss lab results.  A1c stable at 8.0, BMP stable with some transportation degradation causing decreased bicarb and increased anion gap, and stable urine microalbumin creatinine ratio.

## 2023-09-13 ENCOUNTER — Other Ambulatory Visit: Payer: Self-pay | Admitting: Internal Medicine

## 2023-09-13 DIAGNOSIS — K219 Gastro-esophageal reflux disease without esophagitis: Secondary | ICD-10-CM

## 2023-09-29 ENCOUNTER — Other Ambulatory Visit (HOSPITAL_COMMUNITY): Payer: Self-pay

## 2023-09-30 ENCOUNTER — Ambulatory Visit: Payer: Self-pay | Admitting: Student

## 2023-09-30 VITALS — BP 132/85 | HR 75 | Temp 98.2°F | Ht 68.0 in | Wt 211.1 lb

## 2023-09-30 DIAGNOSIS — L89311 Pressure ulcer of right buttock, stage 1: Secondary | ICD-10-CM

## 2023-09-30 DIAGNOSIS — K219 Gastro-esophageal reflux disease without esophagitis: Secondary | ICD-10-CM

## 2023-09-30 NOTE — Progress Notes (Unsigned)
   Established Patient Office Visit  Subjective   Patient ID: Ashley Long, female    DOB: 1960/08/25  Age: 63 y.o. MRN: [id number]  Chief Complaint  Patient presents with   Gastroesophageal Reflux    Constantly burping feels like my food is getting stuck No relief with Protonix  and pepcid  Lower abd pain      Skin Problem    Bump (Boil?) on buttock    HPI Ashley Long is a 63 y.o. female with pertinent PMH of HTN, T2DM with neuropathy, CKD stage IIIb, peripheral artery disease, and GERD, ulcerative rectosigmoiditis who presents today for an acute visit.   ROS   As per assessment and plan Objective:     BP 132/85 (BP Location: Left Arm, Patient Position: Sitting, Cuff Size: Normal)   Pulse 75   Temp 98.2 F (36.8 C) (Oral)   Ht 5' 8 (1.727 m)   Wt 211 lb 1.6 oz (95.8 kg)   LMP 12/25/2015   SpO2 97%   BMI 32.10 kg/m  BP Readings from Last 3 Encounters:  09/30/23 132/85  09/01/23 (!) 149/89  05/26/23 122/80   Wt Readings from Last 3 Encounters:  09/30/23 211 lb 1.6 oz (95.8 kg)  09/01/23 210 lb 9.6 oz (95.5 kg)  05/26/23 210 lb 1.6 oz (95.3 kg)      Physical Exam  General: Sitting in chair, no acute distress Cardiovascular: Regular rate Pulmonary: Breathing comfortably Abdomen: Soft, nontender, nondistended, bowel sounds present MSK: No lower extremity edema bilaterally Skin: Stage I pressure ulcer noted right medial foot, no fluctuation or erythema or TTP noted.  No drainage noted.    Assessment & Plan:  Discussed with Dr. Francesco  Problem List Items Addressed This Visit       Digestive   GERD (gastroesophageal reflux disease) - Primary   Endoscopy on 11/19/2021 showed acute gastritis. Follows [facility], last office visit 05/25/2023, patient was recommended lifestyle modification and addition of Pepcid  at bedtime with PPI BID.  Patient reports symptoms of constantly burping, occasional nausea.  No burning sensation in the back of  the throat.  No chest pain.  Reports mild intermittent suprapubic tenderness, no urinary symptoms.  No pain along the epigastric or right upper quadrant region.  No fevers or chills. -Advised to try over-the-counter Gaviscon or Mag OH for indigestion  - Continue PPI BID - F/u with GI next month         Other   Pressure ulcer, Stage 1, medial buttock   Patient reports that she noticed the last week Thursday, she had a boil on her right buttock.  Denies any pain associated with it.  Denies any draining.  On exam, she has stage I pressure ARBs were noted in the medial buttock, without fluctuation or erythema.  No signs of cellulitis. -Advised to offload, try donut pillow -Advise to apply pressure dressing and keep the area dry and clean       Return if symptoms worsen or fail to improve.    Toma Edwards, DO

## 2023-09-30 NOTE — Patient Instructions (Signed)
 Thank you, [name] for allowing us  to provide your care today. Today we discussed:  For your burping: You can get these from the pharmacy: - Magnesium Hydroxide 5 to 15 mL, prior to each meal.  - Gaviscon daily with meals [make sure it is not taken with Pantoprazole ]  For your pressure ulcer - Off load from your R buttock, you can try getting a Donut Pillow to help alleviate pressure - Apply Vaseline and apply a Pressure ulcer Dressing from pharmacy    I have ordered the following labs for you:  Lab Orders  No laboratory test(s) ordered today     Tests ordered today:  None   Referrals ordered today:   Referral Orders  No referral(s) requested today     I have ordered the following medication/changed the following medications:   Stop the following medications: There are no discontinued medications.   Start the following medications: No orders of the defined types were placed in this encounter.    Follow up: If symptoms worsen     Remember:   Should you have any questions or concerns please call the internal medicine clinic at [phone].     Rayann Atway, D.O. [facility]

## 2023-10-01 DIAGNOSIS — L8990 Pressure ulcer of unspecified site, unspecified stage: Secondary | ICD-10-CM | POA: Insufficient documentation

## 2023-10-01 NOTE — Assessment & Plan Note (Signed)
 Patient reports that she noticed the last week Thursday, she had a boil on her right buttock.  Denies any pain associated with it.  Denies any draining.  On exam, she has stage I pressure ARBs were noted in the medial buttock, without fluctuation or erythema.  No signs of cellulitis. -Advised to offload, try donut pillow -Advise to apply pressure dressing and keep the area dry and clean

## 2023-10-01 NOTE — Assessment & Plan Note (Signed)
 Endoscopy on 11/19/2021 showed acute gastritis. Follows [facility], last office visit 05/25/2023, patient was recommended lifestyle modification and addition of Pepcid  at bedtime with PPI BID.  Patient reports symptoms of constantly burping, occasional nausea.  No burning sensation in the back of the throat.  No chest pain.  Reports mild intermittent suprapubic tenderness, no urinary symptoms.  No pain along the epigastric or right upper quadrant region.  No fevers or chills. -Advised to try over-the-counter Gaviscon or Mag OH for indigestion  - Continue PPI BID - F/u with GI next month

## 2023-10-02 NOTE — Progress Notes (Signed)
 Internal Medicine Clinic Attending  Case discussed with the resident at the time of the visit.  We reviewed the resident's history and exam and pertinent patient test results.  I agree with the assessment, diagnosis, and plan of care documented in the resident's note.

## 2023-10-28 ENCOUNTER — Other Ambulatory Visit (HOSPITAL_COMMUNITY): Payer: Self-pay

## 2023-10-28 ENCOUNTER — Other Ambulatory Visit: Payer: Self-pay | Admitting: Student

## 2023-10-28 DIAGNOSIS — I10 Essential (primary) hypertension: Secondary | ICD-10-CM

## 2023-10-29 ENCOUNTER — Encounter (HOSPITAL_COMMUNITY): Payer: Self-pay | Admitting: Emergency Medicine

## 2023-10-29 ENCOUNTER — Other Ambulatory Visit: Payer: Self-pay

## 2023-10-29 ENCOUNTER — Emergency Department (HOSPITAL_COMMUNITY): Payer: Self-pay

## 2023-10-29 ENCOUNTER — Emergency Department (HOSPITAL_COMMUNITY)
Admission: EM | Admit: 2023-10-29 | Discharge: 2023-10-29 | Disposition: A | Discharge status: Home / Self Care | Payer: Self-pay | Attending: Emergency Medicine | Admitting: Emergency Medicine

## 2023-10-29 ENCOUNTER — Other Ambulatory Visit (HOSPITAL_COMMUNITY): Payer: Self-pay

## 2023-10-29 DIAGNOSIS — Z794 Long term (current) use of insulin: Secondary | ICD-10-CM | POA: Insufficient documentation

## 2023-10-29 DIAGNOSIS — M79671 Pain in right foot: Secondary | ICD-10-CM

## 2023-10-29 DIAGNOSIS — E1122 Type 2 diabetes mellitus with diabetic chronic kidney disease: Secondary | ICD-10-CM | POA: Insufficient documentation

## 2023-10-29 DIAGNOSIS — I129 Hypertensive chronic kidney disease with stage 1 through stage 4 chronic kidney disease, or unspecified chronic kidney disease: Secondary | ICD-10-CM | POA: Insufficient documentation

## 2023-10-29 DIAGNOSIS — L03115 Cellulitis of right lower limb: Secondary | ICD-10-CM | POA: Insufficient documentation

## 2023-10-29 DIAGNOSIS — Z7982 Long term (current) use of aspirin: Secondary | ICD-10-CM | POA: Insufficient documentation

## 2023-10-29 DIAGNOSIS — N189 Chronic kidney disease, unspecified: Secondary | ICD-10-CM | POA: Insufficient documentation

## 2023-10-29 DIAGNOSIS — L03119 Cellulitis of unspecified part of limb: Secondary | ICD-10-CM

## 2023-10-29 MED ORDER — CEPHALEXIN 500 MG PO CAPS: 500.0000 mg | ORAL_CAPSULE | Freq: Two times a day (BID) | ORAL | 0 refills | Status: AC

## 2023-10-29 MED ORDER — HYDROCODONE-ACETAMINOPHEN 5-325 MG PO TABS: 1.0000 | ORAL_TABLET | ORAL | 0 refills | Status: AC | PRN

## 2023-10-29 MED ORDER — HYDROCODONE-ACETAMINOPHEN 5-325 MG PO TABS
1.0000 | ORAL_TABLET | Freq: Once | ORAL | Status: AC
  Administered 2023-10-29: 1 via ORAL
  Filled 2023-10-29: qty 1

## 2023-10-29 MED ORDER — PREDNISONE 20 MG PO TABS: 40.0000 mg | ORAL_TABLET | Freq: Every day | ORAL | 0 refills | Status: AC

## 2023-10-29 MED ORDER — CHLORTHALIDONE 25 MG PO TABS
25.0000 mg | ORAL_TABLET | Freq: Every day | ORAL | 2 refills | Status: AC
  Filled 2023-10-29: qty 90, 90d supply, fill #0
  Filled 2024-02-28: qty 90, 90d supply, fill #1

## 2023-10-29 NOTE — Telephone Encounter (Signed)
 Medication sent to pharmacy

## 2023-10-29 NOTE — Discharge Instructions (Signed)
 Your history, exam, and evaluation today seem consistent with either cellulitis or early gout of the right foot.  We had a shared decision-making conversation and agreed to hold on extensive lab testing but agree with getting the x-ray that did not show evidence of acute bone infection or other acute abnormality.  We feel you are safe for discharge home.  Please take the antibiotics for the next week to help for what I suspect is early cellulitis with the redness and tenderness but also consider taking the burst of steroids for the next 5 days starting tomorrow after talking with your primary team.  And we also use the pain medicine to help with your symptoms.  Please rest and stay hydrated.  We did not give you other anti-inflammatory medications such as Nostril anti-inflammatories given your history of kidney problems and agree with talking with your PCP as well.  If any symptoms change or worsen acutely, please return to the nearest emergency department.

## 2023-10-29 NOTE — ED Provider Notes (Signed)
 [facility] AT [facility] Provider Note   CSN: [id number] Arrival date & time: 10/29/23  9264     Patient presents with: Foot Pain   Ashley Long is a 63 y.o. female.   The history is provided by the patient and medical records. No language interpreter was used.  Foot Pain This is a new problem. The current episode started more than 2 days ago. The problem occurs constantly. The problem has been gradually worsening. Pertinent negatives include no chest pain, no abdominal pain, no headaches and no shortness of breath. The symptoms are aggravated by walking. Nothing relieves the symptoms. She has tried nothing for the symptoms. The treatment provided no relief.       Prior to Admission medications   Medication Sig Start Date End Date Taking? Authorizing Provider  acetaminophen  (TYLENOL ) 500 MG tablet Take 500 mg by mouth every 6 (six) hours as needed for moderate pain.    [provider]  amlodipine -olmesartan  (AZOR ) 10-20 MG tablet Take 1 tablet by mouth daily 08/31/23 08/30/24  Gomez-Caraballo, Maria, MD  ascorbic acid (VITAMIN C) 500 MG tablet Take 500 mg by mouth daily.    [provider]  ASPIRIN  LOW DOSE 81 MG tablet TAKE 1 Tablet BY MOUTH ONCE EVERY DAY 03/25/23   Elnora Ip, MD  atorvastatin  (LIPITOR) 40 MG tablet TAKE 1 Tablet BY MOUTH ONCE EVERY DAY 03/25/23   Elnora Ip, MD  Blood Glucose Monitoring Suppl (WAVESENSE PRESTO) W/DEVICE KIT 1 each by Does not apply route 2 (two) times daily. 10/17/10   Twanna Sora, MD  carvedilol  (COREG ) 25 MG tablet TAKE 1 Tablet  BY MOUTH TWICE DAILY 03/25/23   Elnora Ip, MD  chlorthalidone  (HYGROTON ) 25 MG tablet Take 1 tablet (25 mg total) by mouth daily 10/29/23   Gomez-Caraballo, Maria, MD  famotidine  (PEPCID ) 20 MG tablet TAKE 1 TABLET BY MOUTH AT BEDTIME 09/17/23   Elnora Ip, MD  glucose blood (WAVESENSE PRESTO) test strip Use to test blood sugar 3  times daily. 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 04/05/20   Arminda Daved HERO, MD  Insulin  Syringe-Needle U-100 31G X 15/64 0.3 ML MISC 1 each by Does not apply route 2 (two) times daily with a meal. 04/05/20   Arminda Daved HERO, MD  INVOKANA  100 MG TABS tablet Take 100 mg by mouth daily. 12/24/22   [provider]  Lancets 30G MISC 1 each by Does not apply route 2 (two) times daily. 10/17/10   Twanna Sora, MD  magnesium oxide (MAG-OX) 400 MG tablet Take 400 mg by mouth daily.    [provider]  metFORMIN  (GLUCOPHAGE ) 1000 MG tablet Take 1 tablet (1,000 mg total) by mouth 2 (two) times daily with a meal. 09/01/23   Jolaine Pac, DO  pantoprazole  (PROTONIX ) 40 MG tablet Take 1 tablet (40 mg total) by mouth 2 (two) times daily. 08/31/23   Gomez-Caraballo, Maria, MD  Polyethyl Glycol-Propyl Glycol (SYSTANE OP) Place 1 drop into both eyes daily as needed (irritation).    [provider]  polyethylene glycol (MIRALAX  / GLYCOLAX ) 17 g packet Take 17 g by mouth daily as needed for moderate constipation.    [provider]  valACYclovir (VALTREX) 500 MG tablet Take 500 mg by mouth 2 (two) times daily as needed (fever blisters).    [provider]    Allergies: Patient has no known allergies.    Review of Systems  Constitutional:  Negative for chills, fatigue and fever.  HENT:  Negative for congestion.   Eyes:  Negative for visual disturbance.  Respiratory:  Negative for cough, chest tightness and shortness of breath.    Cardiovascular:  Negative for chest pain.  Gastrointestinal:  Negative for abdominal pain, constipation, diarrhea, nausea and vomiting.  Genitourinary:  Negative for dysuria and flank pain.  Musculoskeletal:  Negative for back pain, neck pain and neck stiffness.  Skin:  Positive for rash (redness on foot). Negative for wound.  Neurological:  Negative for weakness, light-headedness, numbness and headaches.  Psychiatric/Behavioral:  Negative for agitation and confusion.   All other systems reviewed and are negative.   Updated Vital Signs BP 114/68   Pulse 76   Temp 97.6 F (36.4 C) (Oral)   Resp (!) 22   LMP 12/25/2015   SpO2 100%   Physical Exam Vitals and nursing note reviewed.  Constitutional:      General: She is not in acute distress.    Appearance: She is well-developed. She is not ill-appearing, toxic-appearing or diaphoretic.  HENT:     Head: Normocephalic and atraumatic.     Mouth/Throat:     Mouth: Mucous membranes are moist.  Eyes:     Conjunctiva/sclera: Conjunctivae normal.  Cardiovascular:     Rate and Rhythm: Normal rate and regular rhythm.     Heart sounds: No murmur heard. Pulmonary:     Effort: Pulmonary effort is normal. No respiratory distress.     Breath sounds: Normal breath sounds. No wheezing, rhonchi or rales.  Chest:     Chest wall: No tenderness.  Abdominal:     General: Abdomen is flat.     Palpations: Abdomen is soft.     Tenderness: There is no abdominal tenderness. There is no guarding or rebound.  Musculoskeletal:        General: Swelling and tenderness present.     Cervical back: Neck supple.     Right lower leg: No edema.     Left lower leg: No edema.       Feet:  Skin:    General: Skin is warm and dry.     Capillary Refill: Capillary refill takes less than 2 seconds.     Findings: Erythema present. No rash.  Neurological:     General: No focal deficit present.     Mental Status: She is alert.     Sensory: No sensory deficit.      Motor: No weakness.  Psychiatric:        Mood and Affect: Mood normal.     (all labs ordered are listed, but only abnormal results are displayed) Labs Reviewed - No data to display  EKG: None  Radiology: DG Foot Complete Right Result Date: 10/29/2023 CLINICAL DATA:  Acute right foot pain without reported injury. EXAM: RIGHT FOOT COMPLETE - 3+ VIEW COMPARISON:  None Available. FINDINGS: There is no evidence of fracture or dislocation. Vascular calcifications are noted. Severe degenerative changes seen involving the first tarsometatarsal joint with mild hallux valgus deformity. Probable mild degenerative changes are seen involving the  second and third tarsometatarsal joints. 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 No acute abnormality seen. Electronically Signed   By: Lynwood Landy Raddle M.D.   On: 10/29/2023 12:49     Procedures   Medications Ordered in the ED  HYDROcodone -acetaminophen  (NORCO/VICODIN) 5-325 MG per tablet 1 tablet (1 tablet Oral Given 10/29/23 1213)                                    Medical Decision Making Amount and/or Complexity of Data Reviewed Radiology: ordered.  Risk Prescription drug management.    Ashley Long is a 63 y.o. female with past medical history significant for hypertension, hyperlipidemia, diabetes, CKD, GERD, and tubal ligation who presents for right foot pain.  According to patient, over the last few days she has had pain develop in her right medial superior foot near the base of her right great toe.  She has no history of gout and had no trauma.  She has a bunion but did not note any laceration or skin injury overlying.  Denies any injury to it.  Reports the pain is moderate and hurts to walk.  No other ankle pain, calf pain, or leg pain.  No fevers, chills no leg swelling or other foot swelling.  No history of this.  No history of gout in other joints.    On exam,  lungs clear.  Chest nontender.  Abdomen nontender.  Hip and knee and ankle nontender.  She has redness on the dorsum of her right foot that goes towards the base of her toe.  She does have calluses on the medial side of both of her feet consistent with her chronic bunions.  There is tenderness but no crepitance.  No fluctuance.  Patient had intact sensation and strength in the toes and had good capillary refill.  There was erythema with the tenderness.  Clinically I suspect either gout versus early cellulitis.  Although there is no trauma, will get x-ray to look for subcutaneous gas or bony abnormality.  We had a shared decision making conversation on management and we agreed to hold on attempt at aspiration but will give her some pain medicine and get an x-ray.  We will also hold on labs after discussion with her.  If x-ray reassuring, and pain improving, anticipate discharge with with antibiotics for possible early cellulitis as well as pain medicine and steroids for possible gout and close follow-up.  Patient agrees with this plan, anticipate reassessment after workup.   X-ray did not show evidence of acute osteomyelitis or subcutaneous gas.  Arthritis seen.  We went all the findings together we feel she is safe for discharge home patient agrees.  Patient will call her PCP and kidney doctor tomorrow to discuss if she should take the steroids but agrees with some pain medicine and the antibiotics.  Patient had no other questions or concerns and we agreed to discharge for outpatient follow-up with suspected cellulitis with possibility of gout for outpatient follow-up.  Patient agrees to hold on further workup at this time and she was discharged in good condition.      Final diagnoses:  Cellulitis of foot  Right foot pain    ED Discharge Orders          Ordered    HYDROcodone -acetaminophen  (NORCO/VICODIN) 5-325 MG tablet  Every 4 hours PRN  10/29/23 1357    cephALEXin  (KEFLEX ) 500  MG capsule  2 times daily        10/29/23 1357    predniSONE  (DELTASONE ) 20 MG tablet  Daily        10/29/23 1357            Clinical Impression: 1. Cellulitis of foot   2. Right foot pain     Disposition: Discharge  Condition: Good  I have discussed the results, Dx and Tx plan with the pt(& family if present). He/she/they expressed understanding and agree(s) with the plan. Discharge instructions discussed at great length. Strict return precautions discussed and pt &/or family have verbalized understanding of the instructions. No further questions at time of discharge.    Discharge Medication List as of 10/29/2023  2:03 PM     START taking these medications   Details  cephALEXin  (KEFLEX ) 500 MG capsule Take 1 capsule (500 mg total) by mouth 2 (two) times daily for 7 days., Starting Thu 10/29/2023, Until Thu 11/05/2023, Normal    HYDROcodone -acetaminophen  (NORCO/VICODIN) 5-325 MG tablet Take 1 tablet by mouth every 4 (four) hours as needed., Starting Thu 10/29/2023, Normal    predniSONE  (DELTASONE ) 20 MG tablet Take 2 tablets (40 mg total) by mouth daily for 5 days., Starting Thu 10/29/2023, Until Tue 11/03/2023, Normal    chlorthalidone  (HYGROTON ) 25 MG tablet Take 1 tablet (25 mg total) by mouth daily, Starting Thu 10/29/2023, Normal        Follow Up: [facility] [address] [place] [address] [place]  [id number] [phone] Schedule an appointment as soon as possible for a visit       Dela Sweeny, Lonni PARAS, MD 10/29/23 1434

## 2023-10-29 NOTE — ED Triage Notes (Signed)
 PT comes in for swelling and pain to right foot since Monday.  Pt has bunion on the foot and the skin is rd around the bunion and up the dorsal aspect of foot. 8/10.  Very painful to walk on as well.

## 2023-10-29 NOTE — ED Notes (Signed)
 Patient transported to X-ray

## 2023-10-29 NOTE — ED Notes (Signed)
 Awaiting patient from lobby.

## 2023-11-05 ENCOUNTER — Telehealth: Payer: Self-pay | Admitting: Student

## 2023-11-05 NOTE — Telephone Encounter (Signed)
 I called pt to clarify which meds she's requesting a refill - no answer; left message.

## 2023-11-05 NOTE — Telephone Encounter (Signed)
 Return call from pt who stated she needs to reapply for the medication assistance program. So in the meantime, she's requesting a refill on Janumet  50-1000 mg in which she has not stopped taking. Stated she has not taken just the Metformin  which is on her med list. Send Janumet  to [facility]. Thanks

## 2023-11-05 NOTE — Telephone Encounter (Signed)
 Copied from CRM [id number]. Topic: Clinical - Prescription Issue >> Nov 05, 2023  8:39 AM Miquel SAILOR wrote: Reason for CRM: metFORMIN  (GLUCOPHAGE ) 1000 MG tablet [[id number]] with a combitnation -Patient requesting combination pill be filled due to she has to get paperwork and send back to get medication filled but in the mean time if PCP can give 1 refill due to she is out of medication while she goes thru the process to get it. Needs call back [phone]

## 2023-11-05 NOTE — Telephone Encounter (Signed)
 Pt requesting refill of Janumet  while awaiting application for pt assistance program. Unable to pend as not on current med list, last filled 05/25/23

## 2023-11-09 ENCOUNTER — Other Ambulatory Visit (HOSPITAL_COMMUNITY): Payer: Self-pay

## 2023-11-09 MED ORDER — JANUMET 50-1000 MG PO TABS
1.0000 | ORAL_TABLET | Freq: Two times a day (BID) | ORAL | 11 refills | Status: DC
  Filled 2023-11-09 – 2023-11-10 (×2): qty 60, 30d supply, fill #0

## 2023-11-09 NOTE — Telephone Encounter (Signed)
 Refilled Janumet  as patient originally taking this med and discontinued Metformin  monotherapy. Will need to have medication reconciliation at next OV in September with me, already scheduled.  This was sent tot he [facility] on [address]. Please let the patient know

## 2023-11-09 NOTE — Telephone Encounter (Signed)
 Pharmacy reached out to let me know patient is uninsured and this medication is no longer on the IM program.   It looks like I attempted assistance for her last year with [employer] however the application was never returned. On my end, she's never been enrolled.  I can get this mailed to her again. Change of therapy may be needed in the meantime.

## 2023-11-10 ENCOUNTER — Other Ambulatory Visit (HOSPITAL_COMMUNITY): Payer: Self-pay

## 2023-11-10 ENCOUNTER — Telehealth: Payer: Self-pay | Admitting: *Deleted

## 2023-11-10 NOTE — Telephone Encounter (Signed)
 RTC to patient call to pharmacy are awaiting insurance information from the patient as the medication is quite expensive.  Call to patient message left that pharmacy needs a call from her to indicate insurance  information/payment method. Copied from CRM #[id number]. Topic: Clinical - Medication Question >> Nov 10, 2023 11:25 AM Marda MATSU wrote: Attn Nurse Glenda   Pt. Dysert will like a call back from you regarding the status of the medication:  Janumet   Please advise

## 2023-11-10 NOTE — Telephone Encounter (Signed)
 Copied from CRM #[id number]. Topic: Clinical - Medication Question >> Nov 10, 2023 11:25 AM Marda MATSU wrote: Attn Nurse Glenda   Pt. Ashley Long will like a call back from you regarding the status of the medication:  Janumet   Please advise >> Nov 10, 2023 12:21 PM Carmell R wrote: Pt returning call to speak with Kenneth. It is now Lunch. Please contact her back when available.

## 2023-11-10 NOTE — Telephone Encounter (Signed)
 Call to patient cost medication is to high.  Does not currently have Insurance.  Patient only wanted to get with a discount Card.  Call to [facility] cost will be $349. Informed patient.  Patient has called to get Med Assistance.  Awaiting forms as she received meds before.  Has to complete forms and send back for patient assistance.  Wants to know if she can get something else until she can get the assistance .  Will forward to Dr. Kristy. OK to sent to the [facility].  Copied from CRM #[id number]. Topic: Clinical - Medication Question >> Nov 10, 2023 11:25 AM Marda MATSU wrote: Attn Nurse Glenda   Pt. Decoursey will like a call back from you regarding the status of the medication:  Janumet   Please advise >> Nov 10, 2023 12:21 PM Carmell R wrote: Pt returning call to speak with Kenneth. It is now Lunch. Please contact her back when available.

## 2023-11-11 ENCOUNTER — Telehealth: Payer: Self-pay

## 2023-11-11 NOTE — Telephone Encounter (Signed)
 Left message with patient to discuss patient assistance application.   Requested call back to [phone]

## 2023-11-11 NOTE — Progress Notes (Unsigned)
 Pharmacy Medication Assistance Program Note    11/12/2023  Patient ID: Ashley Long, female  DOB: 1960/12/29, 63 y.o.  MRN:  [id number]     11/11/2023  Outreach Medication Two  Manufacturer Medication Two [employer]  [employer] Janumet   Dose of Janumet  50-1000mg   Type of Pharmacist, community to [employer]  Date Application Submitted to Manufacturer 11/11/2023  Patient Assistance Determination Approved  Approval Start Date 11/11/2023     Approved until 11/10/24

## 2023-11-12 NOTE — Telephone Encounter (Signed)
 Call from patient.  Application for pPient Assistance was completed .  Patient wanted Dr. Kristy to be aware and thanked staff dor their assistance.

## 2023-11-13 ENCOUNTER — Other Ambulatory Visit: Payer: Self-pay | Admitting: Student

## 2023-11-13 DIAGNOSIS — K219 Gastro-esophageal reflux disease without esophagitis: Secondary | ICD-10-CM

## 2023-11-17 NOTE — Telephone Encounter (Signed)
 Medication sent to pharmacy

## 2023-11-24 ENCOUNTER — Other Ambulatory Visit: Payer: Self-pay

## 2023-11-26 ENCOUNTER — Other Ambulatory Visit (HOSPITAL_COMMUNITY): Payer: Self-pay

## 2023-12-02 ENCOUNTER — Telehealth: Payer: Self-pay

## 2023-12-02 ENCOUNTER — Ambulatory Visit: Payer: Self-pay | Admitting: Student

## 2023-12-02 VITALS — BP 152/83 | HR 70 | Temp 98.4°F | Ht 68.0 in | Wt 202.0 lb

## 2023-12-02 DIAGNOSIS — E669 Obesity, unspecified: Secondary | ICD-10-CM

## 2023-12-02 DIAGNOSIS — Z7984 Long term (current) use of oral hypoglycemic drugs: Secondary | ICD-10-CM

## 2023-12-02 DIAGNOSIS — N1832 Chronic kidney disease, stage 3b: Secondary | ICD-10-CM

## 2023-12-02 DIAGNOSIS — Z794 Long term (current) use of insulin: Secondary | ICD-10-CM

## 2023-12-02 DIAGNOSIS — Z23 Encounter for immunization: Secondary | ICD-10-CM

## 2023-12-02 DIAGNOSIS — Z833 Family history of diabetes mellitus: Secondary | ICD-10-CM

## 2023-12-02 DIAGNOSIS — I1A0 Resistant hypertension: Secondary | ICD-10-CM

## 2023-12-02 DIAGNOSIS — Z79899 Other long term (current) drug therapy: Secondary | ICD-10-CM

## 2023-12-02 DIAGNOSIS — Z87891 Personal history of nicotine dependence: Secondary | ICD-10-CM

## 2023-12-02 DIAGNOSIS — E1121 Type 2 diabetes mellitus with diabetic nephropathy: Secondary | ICD-10-CM

## 2023-12-02 DIAGNOSIS — E1122 Type 2 diabetes mellitus with diabetic chronic kidney disease: Secondary | ICD-10-CM

## 2023-12-02 DIAGNOSIS — Z6830 Body mass index (BMI) 30.0-30.9, adult: Secondary | ICD-10-CM

## 2023-12-02 LAB — POCT GLYCOSYLATED HEMOGLOBIN (HGB A1C): HbA1c, POC (controlled diabetic range): 7.4 % — AB (ref 0.0–7.0)

## 2023-12-02 LAB — GLUCOSE, CAPILLARY: Glucose-Capillary: 126 mg/dL — ABNORMAL HIGH (ref 70–99)

## 2023-12-02 NOTE — Telephone Encounter (Signed)
 In process of completing [employer] refill form for patients Ozempic  0.25/0.5mg  pens.   Never rec'd shipment from new enrollment earlier in the year, however patient previously did not one to continue on with an injection.  Patient would now like to start this medication. Once medication is taken and established by patient, she will discontinue Janumet  (Per Dr. Elnora).

## 2023-12-02 NOTE — Patient Instructions (Addendum)
 Thank you, Ms.Orlean JONETTA Kitty for allowing us  to provide your care today. Today we discussed   Hypertension: Continue amlodipine -olmesartan  to the 10-20 mg dose daily and the chlorthalidone   Check your blood pressure once a day after your medications in the morning. Please see the blood pressure tips below   Diabetes: - Decrease your Basaglar  (long acting, once daily) insulin  to 24 units daily - You have been approved for the injected Ozempic  medication 0.25 mg weekly  THE MEDICATION IS GOING TO COME TO CLINIC. ONCE WE RECEIVE IT AND GIVE IT TO YOU, PLEASE STOP THE JANUMET  AND SWITCH TO METFORMIN  (ALONE) 1000 MG DAILY   Please stay hydrated and eat smaller meals while you are on this medication. Monitor yourself for signs of: -Nausea, vomiting, bloating, diarrhea, or abdominal discomfort.  -Most of the effects should get better as you continue using this medication -If it does not improve in 2-3 days or you have poor oral intake, experience lightheadedness, or feel sick, please STOP the medication and call our office.   If you experience no side effects/tolerate this therapy, we may be able to increase your dose at the next follow up visit   You received the flu shot today  CONGRATULATIONS ON YOUR WEIGHT LOSS. Continue exercising daily. I would like you to increase weight training (weights or with jugs of milk or water). This will make your muscles and your bones stronger. Good for you!  Keep us  posted   I have ordered the following labs for you:   Lab Orders         Glucose, capillary         Lipid Profile         POC Hbg A1C      I will call if any are abnormal. All of your labs can be accessed through My Chart.   My Chart Access: [email or web address]  Please follow-up in: 3 months for chronic condition follow up   We look forward to seeing you next time. Please call our clinic at [phone] if you have any questions or  concerns.   Elnora Ip, MD 12/02/2023, 11:30 AM [facility]

## 2023-12-02 NOTE — Telephone Encounter (Signed)
 Faxed refill form to [employer].

## 2023-12-04 ENCOUNTER — Encounter: Payer: Self-pay | Admitting: Student

## 2023-12-04 NOTE — Progress Notes (Signed)
 Subjective:  CC: diabetes and blood pressure follow up  HPI:  Ashley Long is a 63 y.o. female with a past medical history stated below and presents today for diabetes and blood pressure follow up. Please see problem based assessment and plan for additional details.  Past Medical History:  Diagnosis Date   Chronic kidney disease (CKD), stage III (moderate) (HCC)    DIABETES MELLITUS, TYPE II 06/02/2006   Last HBA1C 8.7. , check 3 times daily   Epidermal inclusion cyst 03/23/2020   Hematoma of right breast 04/10/2021   Hydronephrosis    sees Dr Jerrye at [facility] ,   HYPERLIPIDEMIA 06/02/2006   Qualifier: Diagnosis of  By: Gust MD, Dorn     HYPERTENSION 06/02/2006   Filed Vitals:   01/21/11 1134  BP: 162/98  Pulse: 80  Temp: 97.4 F (36.3 C)  TempSrc: Oral  Resp: 20  Height: 5' 8 (1.727 m)  Weight: 261 lb (118.389 kg)      Persistent proteinuria 01/21/2018   Personal history of colonic polyps 11/09/2018    Current Outpatient Medications on File Prior to Visit  Medication Sig Dispense Refill   acetaminophen  (TYLENOL ) 500 MG tablet Take 500 mg by mouth every 6 (six) hours as needed for moderate pain.     amlodipine -olmesartan  (AZOR ) 10-20 MG tablet Take 1 tablet by mouth daily 90 tablet 3   ascorbic acid (VITAMIN C) 500 MG tablet Take 500 mg by mouth daily.     ASPIRIN  LOW DOSE 81 MG tablet TAKE 1 Tablet BY MOUTH ONCE EVERY DAY 90 tablet 3   atorvastatin  (LIPITOR) 40 MG tablet TAKE 1 Tablet BY MOUTH ONCE EVERY DAY 90 tablet 3   Blood Glucose Monitoring Suppl (WAVESENSE PRESTO) W/DEVICE KIT 1 each by Does not apply route 2 (two) times daily. 1 each 0   carvedilol  (COREG ) 25 MG tablet TAKE 1 Tablet  BY MOUTH TWICE DAILY 180 tablet 3   chlorthalidone  (HYGROTON ) 25 MG tablet Take 1 tablet (25 mg total) by mouth daily 90 tablet 2   famotidine  (PEPCID ) 20 MG tablet TAKE 1 TABLET BY MOUTH AT BEDTIME 60 tablet 0   glucose blood (WAVESENSE PRESTO) test strip Use to  test blood sugar 3 times daily. diag code E11.9. Insulin  dependent 100 each 11   HYDROcodone -acetaminophen  (NORCO/VICODIN) 5-325 MG tablet Take 1 tablet by mouth every 4 (four) hours as needed. 10 tablet 0   Insulin  Glargine (BASAGLAR  KWIKPEN) 100 UNIT/ML INJECT 26 UNITS UNDER THE SKIN EVERY NIGHT AT BEDTIME 15 mL 5   insulin  lispro (HUMALOG  KWIKPEN) 100 UNIT/ML KwikPen INJECT 5 UNITS UNDER THE SKIN BEFORE BREAKFAST AND LUNCH AND 4 UNITS BEFORE DINNER 15 mL 5   Insulin  Pen Needle (PEN NEEDLES) 31G X 5 MM MISC 4 Doses/Fill by Does not apply route daily. 100 each 11   Insulin  Syringe-Needle U-100 31G X 15/64 0.3 ML MISC 1 each by Does not apply route 2 (two) times daily with a meal. 100 each 11   INVOKANA  100 MG TABS tablet Take 100 mg by mouth daily.     Lancets 30G MISC 1 each by Does not apply route 2 (two) times daily. 100 each 12   magnesium oxide (MAG-OX) 400 MG tablet Take 400 mg by mouth daily.     pantoprazole  (PROTONIX ) 40 MG tablet Take 1 tablet (40 mg total) by mouth 2 (two) times daily. 60 tablet 11   Polyethyl Glycol-Propyl Glycol (SYSTANE OP) Place 1 drop into both  eyes daily as needed (irritation).     polyethylene glycol (MIRALAX  / GLYCOLAX ) 17 g packet Take 17 g by mouth daily as needed for moderate constipation.     sitaGLIPtin -metformin  (JANUMET ) 50-1000 MG tablet Take 1 tablet by mouth 2 (two) times daily with a meal. 60 tablet 11   valACYclovir (VALTREX) 500 MG tablet Take 500 mg by mouth 2 (two) times daily as needed (fever blisters).     No current facility-administered medications on file prior to visit.    Family History  Problem Relation Age of Onset   Diabetes Mother    Other Neg Hx    Breast cancer Neg Hx     Social History   Socioeconomic History   Marital status: Single    Spouse name: Not on file   Number of children: 1   Years of education: 12   Highest education level: High school graduate  Occupational History   Occupation: Lobbyist:  [employer]  Tobacco Use   Smoking status: Former    Current packs/day: 0.00    Types: Cigarettes    Quit date: 10/15/1982    Years since quitting: 41.1   Smokeless tobacco: Never  Vaping Use   Vaping status: Never Used  Substance and Sexual Activity   Alcohol use: No    Alcohol/week: 0.0 standard drinks of alcohol   Drug use: No   Sexual activity: Not Currently    Birth control/protection: Post-menopausal  Other Topics Concern   Not on file  Social History Narrative   Financial assistance approved for 100% discount at [facility] and has [facility] card per [employer]   02/12/10         Social Drivers of Health   Financial Resource Strain: Low Risk  (03/25/2022)   Overall Financial Resource Strain (CARDIA)    Difficulty of Paying Living Expenses: Not hard at all  Food Insecurity: No Food Insecurity (03/03/2023)   Hunger Vital Sign    Worried About Running Out of Food in the Last Year: Never true    Ran Out of Food in the Last Year: Never true  Transportation Needs: No Transportation Needs (03/03/2023)   PRAPARE - Administrator, Civil Service (Medical): No    Lack of Transportation (Non-Medical): No  Physical Activity: Not on file  Stress: Not on file  Social Connections: Moderately Isolated (03/25/2022)   Social Connection and Isolation Panel    Frequency of Communication with Friends and Family: Twice a week    Frequency of Social Gatherings with Friends and Family: Once a week    Attends Religious Services: 1 to 4 times per year    Active Member of [employer] or Organizations: No    Attends Banker Meetings: Never    Marital Status: Never married  Intimate Partner Violence: Not At Risk (12/01/2022)   Humiliation, Afraid, Rape, and Kick questionnaire    Fear of Current or Ex-Partner: No    Emotionally Abused: No    Physically Abused: No    Sexually Abused: No    Review of Systems: ROS negative except for what is noted on the assessment and  plan.  Objective:   Vitals:   12/02/23 1042 12/02/23 1126  BP: (!) 156/81 (!) 152/83  Pulse: 71 70  Temp: 98.4 F (36.9 C)   TempSrc: Oral   SpO2: 100%   Weight: 202 lb (91.6 kg)   Height: 5' 8 (1.727 m)     Physical Exam:  Constitutional: well-appearing woman sitting in chair, in no acute distress HENT: normocephalic atraumatic, mucous membranes moist Eyes: conjunctiva non-erythematous Neck: supple Cardiovascular: regular rate and rhythm, no m/r/g, no LE edema Pulmonary/Chest: normal work of breathing on room air, lungs clear to auscultation bilaterally Abdominal: soft, non-tender, non-distended Neurological: alert & oriented x 3, normal gait Skin: warm and dry Psych: Pleasant mood and affect     Assessment & Plan:   Diabetes mellitus with diabetic nephropathy (HCC) Lab Results  Component Value Date   HGBA1C 7.4 (A) 12/02/2023   HGBA1C 8.0 (A) 09/01/2023   HGBA1C 7.7 (A) 05/26/2023   Improved today. Reviewed cBG data with persistent low cBGs in the AM to 80 with some symptomatic hypoglycemia. Currently on Janumet  50-1000 mg BID (thorough assistance program, delivered to the house) and Invokana  100 mg daily. Lantus  26 units daily and Humalog  5 BF and lunch, and 4 with dinner.  She would like to try GLP-1RA which she has been approved for according to [facility]. As such, our plan is: - Decrease Lantus  to 24 units, continue Humalog  as is - Start Ozempic  0.25 mg weekly; once this gets delivered to the clinic -> she picks it up, then she will discontinue Janumet  and start Metformin  1000 mg daily  Until then, continue Janumet  BID - Will communicate about side effect profile. She will be seen by Dr. Jerrye in ~ 1 month - RTC in 3 months unless increase in hypoglycemic episodes or Ozempic  side effects  Resistant hypertension Well controlled previously. She brought her home readings and her BP cuff. Compared to clinic with similar readings today (152/83 ours, 154/89  hers). Given this reassured that her home readings are SBPs 119 -125 / DBP 79- 82. She has also made significant lifestyle changes and lost weight in the past 6 months to support this.   Will check labs with Dr. Jerrye in the next few weeks - No changes today  CKD stage 3b, GFR 30-44 ml/min (HCC) Last BMP 08/2023 Cr 1.18 and GFR 53. Will see Dr. Jerrye soon for follow up. On ARB, SGLT2i, and soon to start on GLP1RA. Euvolemic today. On statin.  Obesity with serious comorbidity 14lb of intentional wright loss in the past 6 months with increase walking, decreasing sweets and snacking. Encouraged strength training and increase in protein and fiber    Return in about 6 months (around 05/31/2024) for Diabetes follow up in 3 months( with me if possible)..  Patient discussed with Dr. Trudy Ip Kristy Ahr, MD [facility]

## 2023-12-04 NOTE — Assessment & Plan Note (Signed)
 14lb of intentional wright loss in the past 6 months with increase walking, decreasing sweets and snacking. Encouraged strength training and increase in protein and fiber

## 2023-12-04 NOTE — Assessment & Plan Note (Signed)
 Last BMP 08/2023 Cr 1.18 and GFR 53. Will see Dr. Jerrye soon for follow up. On ARB, SGLT2i, and soon to start on GLP1RA. Euvolemic today. On statin.

## 2023-12-04 NOTE — Telephone Encounter (Signed)
   Will have refill form re-signed once back in office. Will need to have Dr. Trudy sign.

## 2023-12-04 NOTE — Assessment & Plan Note (Signed)
 Lab Results  Component Value Date   HGBA1C 7.4 (A) 12/02/2023   HGBA1C 8.0 (A) 09/01/2023   HGBA1C 7.7 (A) 05/26/2023   Improved today. Reviewed cBG data with persistent low cBGs in the AM to 80 with some symptomatic hypoglycemia. Currently on Janumet  50-1000 mg BID (thorough assistance program, delivered to the house) and Invokana  100 mg daily. Lantus  26 units daily and Humalog  5 BF and lunch, and 4 with dinner.  She would like to try GLP-1RA which she has been approved for according to [facility]. As such, our plan is: - Decrease Lantus  to 24 units, continue Humalog  as is - Start Ozempic  0.25 mg weekly; once this gets delivered to the clinic -> she picks it up, then she will discontinue Janumet  and start Metformin  1000 mg daily  Until then, continue Janumet  BID - Will communicate about side effect profile. She will be seen by Dr. Jerrye in ~ 1 month - RTC in 3 months unless increase in hypoglycemic episodes or Ozempic  side effects

## 2023-12-04 NOTE — Assessment & Plan Note (Signed)
 Well controlled previously. She brought her home readings and her BP cuff. Compared to clinic with similar readings today (152/83 ours, 154/89 hers). Given this reassured that her home readings are SBPs 119 -125 / DBP 79- 82. She has also made significant lifestyle changes and lost weight in the past 6 months to support this.   Will check labs with Dr. Jerrye in the next few weeks - No changes today

## 2023-12-09 ENCOUNTER — Other Ambulatory Visit: Payer: Self-pay | Admitting: Student

## 2023-12-09 DIAGNOSIS — I10 Essential (primary) hypertension: Secondary | ICD-10-CM

## 2023-12-09 NOTE — Telephone Encounter (Signed)
 App placed in Dr. Trudy box for signature.

## 2023-12-12 NOTE — Progress Notes (Signed)
 Internal Medicine Clinic Attending  Case discussed with the resident at the time of the visit.  We reviewed the resident's history and exam and pertinent patient test results.  I agree with the assessment, diagnosis, and plan of care documented in the resident's note.

## 2023-12-12 NOTE — Progress Notes (Signed)
.  att

## 2023-12-14 ENCOUNTER — Other Ambulatory Visit: Payer: Self-pay | Admitting: Gastroenterology

## 2023-12-16 NOTE — Telephone Encounter (Signed)
 Faxed completed refill form signed by Dr. Trudy to [facility].

## 2023-12-25 ENCOUNTER — Telehealth: Payer: Self-pay | Admitting: *Deleted

## 2023-12-25 NOTE — Telephone Encounter (Signed)
 Copied from CRM [id number]. Topic: Clinical - Medication Question >> Dec 25, 2023 10:57 AM Mercer PEDLAR wrote: Reason for CRM: Patient is calling to check status of her Ozempic  prescription, she stated that she was told at her appointment on 12/02/23 that she will get a call when the medication arrives to clinic and is ready for pick up but patient hs not heard back yet. Patient also stated that she is having issues with her acid reflex medication that she would like to discuss.

## 2023-12-25 NOTE — Telephone Encounter (Signed)
 Called pt - no answer, left message on vm we have not received her Ozempic  . And to call back about the reflux medication.

## 2023-12-25 NOTE — Telephone Encounter (Signed)
 Patient enrolled until 05/08/24 but will need to be notified of the following changes:  Uninsured patients will still have access to Ozempic ; however, their total household income must be at or below 200% of the federal poverty level.  Uninsured patients must provide proof of a Medicaid denial prior to enrollment if the patient's total household income meets their state federal poverty limit thresholds.

## 2023-12-28 ENCOUNTER — Other Ambulatory Visit (HOSPITAL_COMMUNITY): Payer: Self-pay

## 2023-12-30 NOTE — Telephone Encounter (Signed)
 Rec'd 4 boxes of Ozempic  0.25/0.5mg   (Inject 0.25mg  once weekly for four weeks, then increase to 0.5mg  once weekly).  Please add to patients med list.

## 2023-12-31 ENCOUNTER — Other Ambulatory Visit (HOSPITAL_COMMUNITY): Payer: Self-pay

## 2024-01-04 ENCOUNTER — Telehealth: Payer: Self-pay

## 2024-01-04 DIAGNOSIS — Z794 Long term (current) use of insulin: Secondary | ICD-10-CM

## 2024-01-04 NOTE — Telephone Encounter (Signed)
 Attempted to contact patient to inform her that her Ozempic  has arrived at [facility] for pick-up. Per Dr. Kristy, patient should also be scheduled for a pharmacy clinic appointment when she comes to pick up her medication to help facilitate multiple medication changes that were made at appt on 12/02/23. LVM with call back number.  Lorain Baseman, PharmD [facility] [phone]

## 2024-01-05 ENCOUNTER — Encounter (HOSPITAL_COMMUNITY): Payer: Self-pay | Admitting: Gastroenterology

## 2024-01-05 NOTE — Progress Notes (Signed)
 Attempted to obtain medical history for pre op call via telephone, unable to reach at this time. HIPAA compliant voicemail message left requesting return call to pre surgical testing department.

## 2024-01-05 NOTE — Telephone Encounter (Signed)
 Re-attempted outreach. LVM with call back number. Will also route to IMP admin team.   Lorain Baseman, PharmD [facility] [phone]

## 2024-01-11 NOTE — Anesthesia Preprocedure Evaluation (Signed)
 Anesthesia Evaluation  Patient identified by MRN, date of birth, ID band Patient awake    Reviewed: Allergy & Precautions, NPO status , Patient's Chart, lab work & pertinent test results  Airway Mallampati: II  TM Distance: >3 FB Neck ROM: Full    Dental no notable dental hx.    Pulmonary former smoker   Pulmonary exam normal breath sounds clear to auscultation       Cardiovascular hypertension, Pt. on home beta blockers + Peripheral Vascular Disease  Normal cardiovascular exam Rhythm:Regular Rate:Normal     Neuro/Psych negative neurological ROS  negative psych ROS   GI/Hepatic Neg liver ROS,GERD  ,,  Endo/Other  diabetes, Insulin  Dependent    Renal/GU Renal InsufficiencyRenal disease     Musculoskeletal negative musculoskeletal ROS (+)    Abdominal  (+) + obese  Peds  Hematology  (+) Blood dyscrasia, anemia   Anesthesia Other Findings   Reproductive/Obstetrics negative OB ROS                              Anesthesia Physical Anesthesia Plan  ASA: 3  Anesthesia Plan: MAC   Post-op Pain Management: Minimal or no pain anticipated   Induction: Intravenous  PONV Risk Score and Plan: 2 and Propofol  infusion, Treatment may vary due to age or medical condition and TIVA  Airway Management Planned: Simple Face Mask  Additional Equipment:   Intra-op Plan:   Post-operative Plan:   Informed Consent: I have reviewed the patients History and Physical, chart, labs and discussed the procedure including the risks, benefits and alternatives for the proposed anesthesia with the patient or authorized representative who has indicated his/her understanding and acceptance.     Dental advisory given  Plan Discussed with: CRNA  Anesthesia Plan Comments:          Anesthesia Quick Evaluation

## 2024-01-12 ENCOUNTER — Encounter (HOSPITAL_COMMUNITY): Payer: Self-pay | Admitting: Gastroenterology

## 2024-01-12 ENCOUNTER — Encounter (HOSPITAL_COMMUNITY)
Admission: RE | Disposition: A | Discharge status: Home / Self Care | Payer: Self-pay | Source: Home / Self Care | Attending: Gastroenterology

## 2024-01-12 ENCOUNTER — Ambulatory Visit (HOSPITAL_COMMUNITY)
Admission: RE | Admit: 2024-01-12 | Discharge: 2024-01-12 | Disposition: A | Discharge status: Home / Self Care | Payer: Self-pay | Attending: Gastroenterology | Admitting: Gastroenterology

## 2024-01-12 ENCOUNTER — Ambulatory Visit (HOSPITAL_BASED_OUTPATIENT_CLINIC_OR_DEPARTMENT_OTHER): Payer: Self-pay | Admitting: Anesthesiology

## 2024-01-12 ENCOUNTER — Other Ambulatory Visit: Payer: Self-pay

## 2024-01-12 ENCOUNTER — Encounter (HOSPITAL_COMMUNITY): Payer: Self-pay | Admitting: Anesthesiology

## 2024-01-12 DIAGNOSIS — I10 Essential (primary) hypertension: Secondary | ICD-10-CM

## 2024-01-12 DIAGNOSIS — Z794 Long term (current) use of insulin: Secondary | ICD-10-CM

## 2024-01-12 DIAGNOSIS — Z7984 Long term (current) use of oral hypoglycemic drugs: Secondary | ICD-10-CM | POA: Insufficient documentation

## 2024-01-12 DIAGNOSIS — E1122 Type 2 diabetes mellitus with diabetic chronic kidney disease: Secondary | ICD-10-CM | POA: Insufficient documentation

## 2024-01-12 DIAGNOSIS — Z1211 Encounter for screening for malignant neoplasm of colon: Secondary | ICD-10-CM | POA: Insufficient documentation

## 2024-01-12 DIAGNOSIS — K640 First degree hemorrhoids: Secondary | ICD-10-CM | POA: Insufficient documentation

## 2024-01-12 DIAGNOSIS — K635 Polyp of colon: Secondary | ICD-10-CM | POA: Insufficient documentation

## 2024-01-12 DIAGNOSIS — E119 Type 2 diabetes mellitus without complications: Secondary | ICD-10-CM

## 2024-01-12 DIAGNOSIS — Z87891 Personal history of nicotine dependence: Secondary | ICD-10-CM | POA: Insufficient documentation

## 2024-01-12 DIAGNOSIS — D122 Benign neoplasm of ascending colon: Secondary | ICD-10-CM

## 2024-01-12 DIAGNOSIS — I739 Peripheral vascular disease, unspecified: Secondary | ICD-10-CM | POA: Insufficient documentation

## 2024-01-12 DIAGNOSIS — N182 Chronic kidney disease, stage 2 (mild): Secondary | ICD-10-CM | POA: Insufficient documentation

## 2024-01-12 DIAGNOSIS — K5130 Ulcerative (chronic) rectosigmoiditis without complications: Secondary | ICD-10-CM | POA: Insufficient documentation

## 2024-01-12 DIAGNOSIS — I129 Hypertensive chronic kidney disease with stage 1 through stage 4 chronic kidney disease, or unspecified chronic kidney disease: Secondary | ICD-10-CM | POA: Insufficient documentation

## 2024-01-12 DIAGNOSIS — Z79899 Other long term (current) drug therapy: Secondary | ICD-10-CM | POA: Insufficient documentation

## 2024-01-12 DIAGNOSIS — K219 Gastro-esophageal reflux disease without esophagitis: Secondary | ICD-10-CM | POA: Insufficient documentation

## 2024-01-12 HISTORY — PX: COLONOSCOPY: SHX5424

## 2024-01-12 LAB — GLUCOSE, CAPILLARY: Glucose-Capillary: 174 mg/dL — ABNORMAL HIGH (ref 70–99)

## 2024-01-12 SURGERY — COLONOSCOPY
Anesthesia: Monitor Anesthesia Care

## 2024-01-12 MED ORDER — SODIUM CHLORIDE 0.9 % IV SOLN: INTRAVENOUS | Status: DC | PRN

## 2024-01-12 MED ORDER — SODIUM CHLORIDE 0.9 % IV SOLN
INTRAVENOUS | Status: DC
Start: 2024-01-12 — End: 2024-01-12

## 2024-01-12 MED ORDER — LIDOCAINE 2% (20 MG/ML) 5 ML SYRINGE
INTRAMUSCULAR | Status: DC | PRN
  Administered 2024-01-12: 100 mg via INTRAVENOUS

## 2024-01-12 MED ORDER — PROPOFOL 10 MG/ML IV BOLUS
INTRAVENOUS | Status: DC | PRN
  Administered 2024-01-12: 50 mg via INTRAVENOUS
  Administered 2024-01-12: 100 ug/kg/min via INTRAVENOUS

## 2024-01-12 MED ORDER — PROPOFOL 1000 MG/100ML IV EMUL
INTRAVENOUS | Status: AC
  Filled 2024-01-12: qty 100

## 2024-01-12 NOTE — Interval H&P Note (Signed)
 History and Physical Interval Note:  01/12/2024 10:59 AM  Ashley Long  has presented today for surgery, with the diagnosis of Ulcerative colitis.  The various methods of treatment have been discussed with the patient and family. After consideration of risks, benefits and other options for treatment, the patient has consented to  Procedure(s): COLONOSCOPY (N/A) as a surgical intervention.  The patient's history has been reviewed, patient examined, no change in status, stable for surgery.  I have reviewed the patient's chart and labs.  Questions were answered to the patient's satisfaction.     Jerrell JAYSON Sol

## 2024-01-12 NOTE — Anesthesia Procedure Notes (Signed)
 Procedure Name: MAC Date/Time: 01/12/2024 11:30 AM  Performed by: Obadiah Reyes BROCKS, CRNAPre-anesthesia Checklist: Patient identified, Emergency Drugs available, Suction available, Timeout performed and Patient being monitored Patient Re-evaluated:Patient Re-evaluated prior to induction Oxygen Delivery Method: Simple face mask Preoxygenation: Pre-oxygenation with 100% oxygen Induction Type: IV induction

## 2024-01-12 NOTE — Op Note (Signed)
 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 Estimated Blood Loss:     Estimated blood loss was minimal. Procedure:                Pre-Anesthesia Assessment:                           - Prior to the procedure, a History and Physical                            was performed, and patient medications and                            allergies were reviewed. The patient's tolerance of                            previous anesthesia was also reviewed. The risks                            and benefits of the procedure and the sedation                            options and risks were discussed with the patient.                            All questions were answered, and informed consent                            was obtained. Prior Anticoagulants: The patient has                            taken no anticoagulant or antiplatelet agents. ASA                            Grade Assessment: III - A patient with severe  systemic disease. After reviewing the risks and                            benefits, the patient was deemed in satisfactory                            condition to undergo the procedure.                           After obtaining informed consent, the colonoscope                            was passed under direct  vision. Throughout the                            procedure, the patient's blood pressure, pulse, and                            oxygen saturations were monitored continuously. The                            [id number]-[id number] ([id number]) Olympus colonscope was                            introduced through the anus and advanced to the the                            cecum, identified by appendiceal orifice and                            ileocecal valve. The terminal ileum, ileocecal                            valve, appendiceal orifice, and rectum were                            photographed. Scope In: 11:16:15 AM Scope Out: 11:34:12 AM Scope Withdrawal Time: 0 hours 15 minutes 23 seconds  Total Procedure Duration: 0 hours 17 minutes 57 seconds  Findings:      The perianal and digital rectal examinations were normal.      A patchy area of mildly congested, erythematous, granular and inflamed       mucosa was found in the rectum, in the recto-sigmoid colon and in the       sigmoid colon. Biopsies were taken with a cold forceps for histology.       Estimated blood loss was minimal.      The area from descending colon to cecum appeared normal. Biopsies were       taken with a cold forceps for histology. Estimated blood loss was       minimal.      A 2 mm polyp was found in the ascending colon. The polyp was       semi-sessile. Biopsies were taken with a cold forceps for histology.       Estimated blood loss was minimal.      Internal hemorrhoids were found  during retroflexion. The hemorrhoids       were large and Grade I (internal hemorrhoids that do not prolapse).      The terminal ileum appeared normal. Impression:               - Congested, erythematous, granular and inflamed                            mucosa in the rectum, in the recto-sigmoid colon                            and in the sigmoid colon. Biopsied.                           - The descending colon to cecum is normal. Biopsied.                            - One 2 mm polyp in the ascending colon. Biopsied.                           - Internal hemorrhoids.                           - The examined portion of the ileum was normal. Moderate Sedation:      N/A - MAC procedure Recommendation:           - Patient has a contact number available for                            emergencies. The signs and symptoms of potential                            delayed complications were discussed with the                            patient. Return to normal activities tomorrow.                            Written discharge instructions were provided to the                            patient.                           - Advance diet as tolerated.                           - Await pathology results.                           - Repeat colonoscopy for surveillance based on                            pathology results. 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 All rights reserved. The codes documented in this report are preliminary and upon coder review may  be revised to meet current compliance requirements. Jerrell JAYSON Sol, MD 01/12/2024 11:46:52 AM This report has been signed electronically. Number of Addenda: 0

## 2024-01-12 NOTE — H&P (Signed)
 Date of Initial H&P: 01/06/24  History reviewed, patient examined, no change in status, stable for surgery.

## 2024-01-12 NOTE — Discharge Instructions (Addendum)

## 2024-01-12 NOTE — Transfer of Care (Signed)
 Immediate Anesthesia Transfer of Care Note  Patient: Ashley Long  Procedure(s) Performed: COLONOSCOPY  Patient Location: Endoscopy Unit  Anesthesia Type:MAC  Level of Consciousness: awake  Airway & Oxygen Therapy: Patient Spontanous Breathing and Patient connected to face mask oxygen  Post-op Assessment: Report given to RN and Post -op Vital signs reviewed and unstable, Anesthesiologist notified  Post vital signs: Reviewed and stable  Last Vitals:  Vitals Value Taken Time  BP    Temp    Pulse 71 01/12/24 11:44  Resp 16 01/12/24 11:44  SpO2 99 % 01/12/24 11:44  Vitals shown include unfiled device data.  Last Pain:  Vitals:   01/12/24 0938  TempSrc: Temporal  PainSc: 0-No pain         Complications: No notable events documented.

## 2024-01-13 ENCOUNTER — Encounter (HOSPITAL_COMMUNITY): Payer: Self-pay | Admitting: Gastroenterology

## 2024-01-13 LAB — SURGICAL PATHOLOGY

## 2024-01-13 NOTE — Telephone Encounter (Signed)
 Has appt 03/30/23.

## 2024-01-13 NOTE — Anesthesia Postprocedure Evaluation (Signed)
 Anesthesia Post Note  Patient: Ashley Long  Procedure(s) Performed: COLONOSCOPY     Patient location during evaluation: PACU Anesthesia Type: MAC Level of consciousness: awake and alert Pain management: pain level controlled Vital Signs Assessment: post-procedure vital signs reviewed and stable Respiratory status: spontaneous breathing Cardiovascular status: stable Anesthetic complications: no   No notable events documented.  Last Vitals:  Vitals:   01/12/24 1150 01/12/24 1200  BP: (!) 151/86 (!) 160/80  Pulse: 66 62  Resp: 16 13  Temp:    SpO2: 97% 98%    Last Pain:  Vitals:   01/12/24 1200  TempSrc:   PainSc: 0-No pain                 Norleen Pope

## 2024-01-18 ENCOUNTER — Ambulatory Visit (INDEPENDENT_AMBULATORY_CARE_PROVIDER_SITE_OTHER): Payer: Self-pay

## 2024-01-18 ENCOUNTER — Ambulatory Visit: Payer: Self-pay | Admitting: Student

## 2024-01-18 ENCOUNTER — Ambulatory Visit: Payer: Self-pay

## 2024-01-18 VITALS — BP 153/85 | HR 75 | Temp 98.3°F | Ht 68.0 in | Wt 199.6 lb

## 2024-01-18 DIAGNOSIS — E1121 Type 2 diabetes mellitus with diabetic nephropathy: Secondary | ICD-10-CM

## 2024-01-18 DIAGNOSIS — Z5181 Encounter for therapeutic drug level monitoring: Secondary | ICD-10-CM

## 2024-01-18 DIAGNOSIS — Z794 Long term (current) use of insulin: Secondary | ICD-10-CM

## 2024-01-18 DIAGNOSIS — R300 Dysuria: Secondary | ICD-10-CM

## 2024-01-18 DIAGNOSIS — Z7984 Long term (current) use of oral hypoglycemic drugs: Secondary | ICD-10-CM

## 2024-01-18 DIAGNOSIS — R2231 Localized swelling, mass and lump, right upper limb: Secondary | ICD-10-CM

## 2024-01-18 DIAGNOSIS — M109 Gout, unspecified: Secondary | ICD-10-CM

## 2024-01-18 MED ORDER — METFORMIN HCL 500 MG PO TABS: 500.0000 mg | ORAL_TABLET | Freq: Two times a day (BID) | ORAL | Status: DC

## 2024-01-18 MED ORDER — COLCHICINE 0.6 MG PO TABS: ORAL_TABLET | ORAL | 0 refills | Status: AC

## 2024-01-18 NOTE — Patient Instructions (Signed)
 Thank you so much for coming to the clinic today!   I have sent in an anti-inflammatory medication to help with your symptoms. Take two tablets today, followed by one tablet an hour later. Then take one tablet twice a day until symptoms improve.  If your symptoms don't improve, or you're noticing fevers, chills, worsening in the redness, please come back to the clinic or send me a message on MyChart  If you have any questions please feel free to the call the clinic at anytime at [phone]. It was a pleasure seeing you!  Best, Dr. Wofford Stratton

## 2024-01-18 NOTE — Telephone Encounter (Signed)
 Walk-in Pt is here at the office requesting to be seen for right pinky swollen and pressure w/urination. Aptt given w/Dr Nooruddin @ 1515PM.

## 2024-01-18 NOTE — Telephone Encounter (Signed)
 Called pt - no answer; left message on vm to call the office  and schedule an appt if one is needed.

## 2024-01-18 NOTE — Patient Instructions (Addendum)
 It was nice to see you today!  Your goal blood sugar is 80-130 mg/dL before eating and less than 180 mg/dL after eating. Monitor blood sugars at home and keep a log (glucometer or piece of paper) to bring with you to your next visit.  Medication Changes (for Diabetes):  STOP Janumet   START Ozempic  0.25 mg once weekly (scroll until you see the number 0.25 mg) for 4 weeks. Then I will call you and we will decide about increasing to 0.5 mg weekly.  Start metformin  IR 500 mg TWICE daily - this is a slightly reduced dose for your kidneys.  Decrease Lantus  to 22 units daily.   Continue Humalog  2-4 units with each meal  Continue Invokana  100 mg daily  Continue all other medication the same.   If you experience symptoms of a low blood sugar (dizzy, shaky, sweaty) check your blood sugar. If it is less than 70 mg/dL, you should eat or drink 15 grams of fast-acting carbohydrates like 4 glucose tablets, 1/2 cup of regular soda, or 1/2 cup of juice. Recheck your blood sugar after 15 minutes. If the level is still below 70 mg/dL repeat the process. If this occurs frequently, you should notify your healthcare provider.   Lifestyle Recommendations:  Aim for 150 minutes of moderate intensity exercise every week. This is any activity that elevates your heart rate and breathing rate, but still allows you to carry on a conversation. Exercising after meals can help prevent spikes in your blood sugar.  Diet Recommendations:   Try to eat 3 real meals using the healthy plate method and 1-2 snacks per day. Never go more than 4-5 hours while awake without eating. Eat breakfast within the first hour of getting up.     Carbohydrates include starch, sugar, and fiber. Sugar and starch raise blood glucose.  Starchy foods include bread, rice, pasta, potatoes, corn, cereal, grits, crackers, bagels, muffins, all baked foods Some fruits are higher in sugar, like grapes, watermelon, oranges, and most tropical fruits.  Limit your serving size to 1/2 cup at a time.  Protein foods include meat, fish, poultry, eggs, dairy, and beans (although beans also provide carbohydrates).  Non-starchy vegetables do not impact your blood sugar very much. These include greens, broccoli, asparagus, carrots, cauliflower, cucumber, mushrooms, peppers, yellow squash, zucchini squash, tomato, to name a few!  Avoid all sugary beverages. Stay hydrated with water throughout the day. Sparkling/flavored water, unsweet tea, black coffee, and zero-sugar or diet drinks will not impact your blood sugar, but they should not be used as your only source of hydration!  You can find more information at https://diabetes.org/food-nutrition/eating-healthy   Lorain Baseman, PharmD [facility] [phone]

## 2024-01-18 NOTE — Progress Notes (Signed)
 01/18/2024 Name: Ashley Long MRN: [id number] DOB: 07-25-1960  Chief Complaint  Patient presents with   Diabetes    Ashley Long is a 63 y.o. year old female who was referred for medication management by their primary care provider, Elnora Ip, MD. They presented for a face to face visit today.   They were referred to the pharmacist by their PCP for assistance in managing diabetes . PMH includes HTN, PAD, GERD, T2DM, CKD3, HLD.    Subjective: Patient was last seen by PCP, Dr. Kristy on 12/02/23. At last visit, assisted patient in obtaining Ozempic  via PAP. Instructed patient to decrease Lantus  to 24 units daily. Once Ozempic  arrived, patient was to stop Janumet  and replace with metformin . Patient was scheduled with pharmacy to review changes once Ozempic  arrived.  Today, patient presents in  good spirits and presents without any assistance. She is also needing to see the physician for an acute gout flare. She brings her glucometer and med list for review. She reports she has seen her kidney doctor recently who has suggested decreasing metformin  to 500 mg BID when she stops Janumet . She also continues to have s/sx of acid reflux and frequent belching. Reports she has not started esomeprazole yet because it says esomeprazole magnesium and she wanted to check with neprhology to make sure she can take this with her magnesium supplement.   Care Team: Primary Care Provider: Elnora Ip, MD ; Next Scheduled Visit: 03/09/24  Medication Access/Adherence  Current Pharmacy:  [facility] [place], [place] - [address] [place] [address] [place] [address] Phone: [phone] Fax: [phone]  [facility] [address], [address] [place] [address] Phone: [phone] Fax: [phone]  [facility] [address] ([place]), [place] - [address] [place] ([place]) [place] [address] Phone: [phone] Fax: [phone]  [facility] - [facility] [address] [place] [address] Phone: [phone] Fax: [phone]  [facility] [place], [place] - [address] [place] [address] Phone: [phone] Fax: [phone]   Patient reports affordability concerns with their medications: Yes  - uninsured, uses [facility]. [employer] approved through Feb 2026. Patient reports access/transportation concerns to their pharmacy: No  Patient reports adherence concerns with their medications:  No     Diabetes:  Current medications: Lantus  24 units daily (at bedtime), Humalog  2-3 units with breakfast: 2 units with lunch - 1-2 units with dinner, Janumet  50-1000 mg BID, Invokana  100 mg daily  Current glucose readings:  01/18/24 - AM 122, 12PM 139 mg/dL 88/7/74: 5PM 871 mg/dL, 87JF 88, 6AM 91 88/8/74: 4PM 129 mg/dL, 88JF 806, 5AM 82 mg/dL > 856 mg/dL 89/68: 4PM 893, 1PM 860 mg/dL, 885 mg/dL, 5AM 99, 5AM 85 89/69: 4PM 123, 11AM 208, 5AM 135,  10/29: 4PM 180, 11AM 167, 5AM 209 10/28: 4PM 228, 11AM 153 mg/dL, 3AM 858,  89/72: 10 PM 160, 3PM 173, 1:40: 173 mg/dL ----------------------- 10/20: 5:10AM - 69 mg/dL  Patient reports hypoglycemic s/sx including just feeling a little off - denies being shaky or sweaty.  Patient denies hyperglycemic symptoms including polyuria, polydipsia, polyphagia, nocturia, neuropathy, blurred vision.  Current meal patterns: 3 meals/day - tries to eat protein with each  - Drinks: water throughout the day  Current physical activity: did not discuss today  Macrovascular and Microvascular Risk Reduction:  Statin? yes (atorvastatin  40 mg  daily); ACEi/ARB? yes (amlodipine -olmesartan  10-20 mg daily) Last urinary albumin/creatinine ratio:  Lab Results  Component Value Date   MICRALBCREAT 52 (H) 09/01/2023   MICRALBCREAT 69 (H) 08/13/2022    MICRALBCREAT 44 (H) 01/29/2021   MICRALBCREAT 33.2 (H) 01/21/2018   MICRALBCREAT 39.3 (H) 09/09/2017   MICRALBCREAT 322.1 (H) 07/19/2014   MICRALBCREAT 59.5 (H) 11/10/2011   MICRALBCREAT 65.1 (H) 03/06/2009   MICRALBCREAT 203.2 (H) 01/05/2008   Last eye exam:  Lab Results  Component Value Date   HMDIABEYEEXA Retinopathy (A) 05/13/2021   Last foot exam: 05/26/2023 Tobacco Use:  Tobacco Use: Medium Risk (01/12/2024)   Patient History    Smoking Tobacco Use: Former    Smokeless Tobacco Use: Never    Passive Exposure: Not on file     Objective:  BP Readings from Last 3 Encounters:  01/18/24 (!) 153/85  01/12/24 (!) 160/80  12/02/23 (!) 152/83    Lab Results  Component Value Date   HGBA1C 7.4 (A) 12/02/2023   HGBA1C 8.0 (A) 09/01/2023   HGBA1C 7.7 (A) 05/26/2023       Latest Ref Rng & Units 09/01/2023   11:07 AM 02/25/2023   11:01 AM 09/11/2022   10:53 AM  BMP  Glucose 70 - 99 mg/dL 94  886  859   BUN 8 - 27 mg/dL 19  21  23    Creatinine 0.57 - 1.00 mg/dL 8.81  8.87  8.62   BUN/Creat Ratio 12 - 28 16  19  17    Sodium 134 - 144 mmol/L 140  136  138   Potassium 3.5 - 5.2 mmol/L 4.1  4.3  4.2   Chloride 96 - 106 mmol/L 100  98  102   CO2 20 - 29 mmol/L 18  21  19    Calcium  8.7 - 10.3 mg/dL 89.9  9.7  9.9     Lab Results  Component Value Date   CHOL 86 (L) 12/01/2022   HDL 39 (L) 12/01/2022   LDLCALC 29 12/01/2022   TRIG 93 12/01/2022   CHOLHDL 2.2 12/01/2022    Medications Reviewed Today     Reviewed by Brinda Lorain SQUIBB, RPH (Pharmacist) on 01/18/24 at 1732  Med List Status: <None>   Medication Order Taking? Sig Documenting Provider Last Dose Status Informant  acetaminophen  (TYLENOL ) 500 MG tablet [id number]  Take 500 mg by mouth every 6 (six) hours as needed for moderate pain. [provider]  Active Self  amlodipine -olmesartan  (AZOR ) 10-20 MG tablet [id number] Yes Take 1 tablet by mouth daily Gomez-Caraballo, Maria, MD  Active   ascorbic acid  (VITAMIN C) 500 MG tablet [id number] Yes Take 500 mg by mouth daily. [provider]  Active Self  ASPIRIN  EC ADULT LOW DOSE 81 MG tablet [id number] Yes TAKE 1 Tablet BY MOUTH ONCE DAILY Elnora Ip, MD  Active   atorvastatin  (LIPITOR) 40 MG tablet [id number] Yes TAKE 1 Tablet BY MOUTH ONCE DAILY Gomez-Caraballo, Maria, MD  Active   Blood Glucose Monitoring Suppl (WAVESENSE PRESTO) W/DEVICE KIT [id number]  1 each by Does not apply route 2 (two) times daily. Twanna Sora, MD  Active Self  carvedilol  (COREG ) 25 MG tablet [id number] Yes TAKE 1 Tablet  BY MOUTH TWICE DAILY Elnora Ip, MD  Active   chlorthalidone  (HYGROTON ) 25 MG tablet [id number] Yes Take 1 tablet (25 mg total) by mouth daily Gomez-Caraballo, Maria, MD  Active   colchicine 0.6 MG tablet [id number]  Take 2  tablets initially, then 0.6 mg orally one hour later  on day 1; then 0.6 mg orally once or twice daily starting on day 2, for a total of 7 to 10 days or until symptoms improve Nooruddin, Saad, MD  Active   famotidine  (PEPCID ) 20 MG tablet [id number] Yes TAKE 1 TABLET BY MOUTH AT BEDTIME Gomez-Caraballo, Maria, MD  Active   glucose blood (WAVESENSE PRESTO) test strip [id number]  Use to test blood sugar 3 times daily. diag code E11.9. Insulin  dependent Burns, Alexa R, MD  Active Self  HYDROcodone -acetaminophen  (NORCO/VICODIN) 5-325 MG tablet [id number]  Take 1 tablet by mouth every 4 (four) hours as needed. Tegeler, Lonni PARAS, MD  Active   Insulin  Glargine (BASAGLAR  KWIKPEN) 100 UNIT/ML [id number] Yes INJECT 26 UNITS UNDER THE SKIN EVERY NIGHT AT BEDTIME Elnora Ip, MD  Active   insulin  lispro (HUMALOG  KWIKPEN) 100 UNIT/ML KwikPen [id number] Yes INJECT 5 UNITS UNDER THE SKIN BEFORE BREAKFAST AND LUNCH AND 4 UNITS BEFORE JOAQUIN Elnora Ip, MD  Active   Insulin  Pen Needle (PEN NEEDLES) 31G X 5 MM MISC [id number]  4 Doses/Fill by Does not apply route daily. Arminda Daved HERO, MD  Active Self   Insulin  Syringe-Needle U-100 31G X 15/64 0.3 ML MISC [id number]  1 each by Does not apply route 2 (two) times daily with a meal. Arminda Daved HERO, MD  Active Self  INVOKANA  100 MG TABS tablet [id number] Yes Take 100 mg by mouth daily. [provider]  Active   Lancets 30G MISC [id number]  1 each by Does not apply route 2 (two) times daily. Twanna Sora, MD  Active Self  magnesium oxide (MAG-OX) 400 MG tablet [id number] Yes Take 400 mg by mouth daily. [provider]  Active Self  metFORMIN  (GLUCOPHAGE ) 500 MG tablet [id number] Yes Take 1 tablet (500 mg total) by mouth 2 (two) times daily with a meal. Nooruddin, Saad, MD  Active   pantoprazole  (PROTONIX ) 40 MG tablet [id number] Yes Take 1 tablet (40 mg total) by mouth 2 (two) times daily. Gomez-Caraballo, Maria, MD  Active   Polyethyl Glycol-Propyl Glycol [facility] OP) [id number]  Place 1 drop into both eyes daily as needed (irritation). [provider]  Active Self  polyethylene glycol (MIRALAX  / GLYCOLAX ) 17 g packet [id number] Yes Take 17 g by mouth daily as needed for moderate constipation. [provider]  Active Self  Semaglutide ,0.25 or 0.5MG /DOS, (OZEMPIC , 0.25 OR 0.5 MG/DOSE,) 2 MG/3ML SOPN [id number] Yes Inject 0.25 mg into the skin once a week. Via [employer] Patient Assistance. [provider]  Active   valACYclovir (VALTREX) 500 MG tablet [id number] Yes Take 500 mg by mouth 2 (two) times daily as needed (fever blisters). [provider]  Active Self              Assessment/Plan:   Diabetes: - Currently uncontrolled with most recent A1C of 7.4% above goal <7%. Medication adherence appears appropriate. Patient reports some episodes of AM hypoglycemia. Will slightly reduce basal insulin  as she starts Ozempic . Encouraged her to monitor for worsening acid reflux or constipation with starting GLP-1RA. Advised her that it is ok to take esomeprazole with magnesium supplement, but she can wait for  response from nephrology. Encouraged her to eat small meals throughout the day and stay hydrated with water to avoid GI AE. Unable to see most recent BMP, but patient said her nephrologist recommended metformin  TDD of 1000 mg, so will dose-reduce today. Confirmed patient can cut metformin  IR 1000 mg tablets in half (says she has a 60 day supply at home). -  Last UACR June 2025 - 52 mg/g - Reviewed long term cardiovascular and renal outcomes of uncontrolled blood sugar - Reviewed goal A1c, goal fasting, and goal 2 hour post prandial glucose - Reviewed hypoglycemia management plan and the rule of 15 - Reviewed dietary modifications including  utilizing the healthy plate method, limiting portion size of carbohydrate foods, increasing intake of protein and non-starchy vegetables. Counseled patient to stay hydrated with water throughout the day. - Reviewed lifestyle modifications including: aiming for 150 minutes of moderate intensity exercise every week.  - Recommend to start Ozempic  0.25 mg weekly x 4 weeks then increase to 0.5 mg weekly.  - Recommend to stop Janumet  and start metformin  IR 500 mg BID - Recommend to continue Invokana  100 mg daily - Recommend to decrease Lantus  to 22 units daily. May decrease to 20 units daily if she has recurrent hypoglycemia.  - Recommend to continue Humalog  2-4 units TID with meals - Recommend to check glucose TID Counseled patient to bring glucometer or BG log to every appointment. - Next A1C due 03/02/24     Unable to address HTN today as patient was also seeing physician for acute gout flare. Plan to address at follow-up.   Written patient instructions provided. Patient verbalized understanding of treatment plan.   Follow Up Plan:  Pharmacist telephone 02/15/24 PCP clinic visit 03/09/24   Lorain Baseman, PharmD [facility] [phone]

## 2024-01-18 NOTE — Telephone Encounter (Signed)
 3 attempts unsuccessful to contact patient. Will forward to office for further follow up.

## 2024-01-18 NOTE — Telephone Encounter (Signed)
 Called pt - left message to return our call.      Summary: [phone] pain in right pinky and pressure when urinating   Reason for Triage: Patient states that her right pinky is hurting really bad no accident or hitting just extreme pain, also pressure in lower abdomen when urinating. [phone]

## 2024-01-19 DIAGNOSIS — R2231 Localized swelling, mass and lump, right upper limb: Secondary | ICD-10-CM | POA: Insufficient documentation

## 2024-01-19 DIAGNOSIS — R300 Dysuria: Secondary | ICD-10-CM | POA: Insufficient documentation

## 2024-01-19 NOTE — Assessment & Plan Note (Signed)
 Acute swelling and pain in the right pinky joint with redness suggests inflammation.  Given acute onset and worsening, favoring gout gout over inflammatory arthropathies, or cellulitis.  Will treat with colchicine for now, instructed patient that if symptoms do not improve, or erythema gets worse and extends up her and even more to return to clinic.  - Prescribed colchicine for inflammation. Instructed to take until symptoms improve, then stop. - Advised to return if symptoms do not improve or if redness worsens or spreads. - Sent prescription to [place] in the [facility].

## 2024-01-19 NOTE — Progress Notes (Signed)
 CC: Acute visit for right fifth digit swelling  HPI:  Ashley Long is a 63 y.o. female living with a history stated below and presents today for acute visit for right hand fifth digit swelling. Please see problem based assessment and plan for additional details.  Discussed the use of AI scribe software for clinical note transcription with the patient, who gave verbal consent to proceed.  History of Present Illness Ashley Long is a 64 year old female who presents with swelling and pain in her pinky finger and urinary symptoms.  She noticed soreness in her pinky finger on Saturday, which she initially ignored. By Sunday morning, the finger was swollen, and the pain is sometimes sharp, particularly with movement. There is no history of trauma to the area. The swelling has increased since yesterday, and redness is now present. She has not experienced similar symptoms in the past.  She has been experiencing pressure and pain during urination for the past three days, which she associates with a previous urinary tract infection. There is no fever or chills.  She has not been sick recently and denies anyone around her having similar symptoms.    Past Medical History:  Diagnosis Date   Chronic kidney disease (CKD), stage III (moderate) (HCC)    DIABETES MELLITUS, TYPE II 06/02/2006   Last HBA1C 8.7. , check 3 times daily   Epidermal inclusion cyst 03/23/2020   Hematoma of right breast 04/10/2021   Hydronephrosis    sees Dr Jerrye at [facility] ,   HYPERLIPIDEMIA 06/02/2006   Qualifier: Diagnosis of  By: Gust MD, Dorn     HYPERTENSION 06/02/2006   Filed Vitals:   01/21/11 1134  BP: 162/98  Pulse: 80  Temp: 97.4 F (36.3 C)  TempSrc: Oral  Resp: 20  Height: 5' 8 (1.727 m)  Weight: 261 lb (118.389 kg)      Persistent proteinuria 01/21/2018   Personal history of colonic polyps 11/09/2018    Current Outpatient Medications on File Prior to Visit  Medication Sig Dispense  Refill   acetaminophen  (TYLENOL ) 500 MG tablet Take 500 mg by mouth every 6 (six) hours as needed for moderate pain.     amlodipine -olmesartan  (AZOR ) 10-20 MG tablet Take 1 tablet by mouth daily 90 tablet 3   ascorbic acid (VITAMIN C) 500 MG tablet Take 500 mg by mouth daily.     ASPIRIN  EC ADULT LOW DOSE 81 MG tablet TAKE 1 Tablet BY MOUTH ONCE DAILY 90 tablet 3   atorvastatin  (LIPITOR) 40 MG tablet TAKE 1 Tablet BY MOUTH ONCE DAILY 90 tablet 3   Blood Glucose Monitoring Suppl (WAVESENSE PRESTO) W/DEVICE KIT 1 each by Does not apply route 2 (two) times daily. 1 each 0   carvedilol  (COREG ) 25 MG tablet TAKE 1 Tablet  BY MOUTH TWICE DAILY 180 tablet 3   chlorthalidone  (HYGROTON ) 25 MG tablet Take 1 tablet (25 mg total) by mouth daily 90 tablet 2   famotidine  (PEPCID ) 20 MG tablet TAKE 1 TABLET BY MOUTH AT BEDTIME 60 tablet 0   glucose blood (WAVESENSE PRESTO) test strip Use to test blood sugar 3 times daily. diag code E11.9. Insulin  dependent 100 each 11   HYDROcodone -acetaminophen  (NORCO/VICODIN) 5-325 MG tablet Take 1 tablet by mouth every 4 (four) hours as needed. 10 tablet 0   Insulin  Glargine (BASAGLAR  KWIKPEN) 100 UNIT/ML INJECT 26 UNITS UNDER THE SKIN EVERY NIGHT AT BEDTIME 15 mL 5   insulin  lispro (HUMALOG  KWIKPEN) 100 UNIT/ML KwikPen  INJECT 5 UNITS UNDER THE SKIN BEFORE BREAKFAST AND LUNCH AND 4 UNITS BEFORE DINNER 15 mL 5   Insulin  Pen Needle (PEN NEEDLES) 31G X 5 MM MISC 4 Doses/Fill by Does not apply route daily. 100 each 11   Insulin  Syringe-Needle U-100 31G X 15/64 0.3 ML MISC 1 each by Does not apply route 2 (two) times daily with a meal. 100 each 11   INVOKANA  100 MG TABS tablet Take 100 mg by mouth daily.     Lancets 30G MISC 1 each by Does not apply route 2 (two) times daily. 100 each 12   magnesium oxide (MAG-OX) 400 MG tablet Take 400 mg by mouth daily.     metFORMIN  (GLUCOPHAGE ) 500 MG tablet Take 1 tablet (500 mg total) by mouth 2 (two) times daily with a meal.      pantoprazole  (PROTONIX ) 40 MG tablet Take 1 tablet (40 mg total) by mouth 2 (two) times daily. 60 tablet 11   Polyethyl Glycol-Propyl Glycol (SYSTANE OP) Place 1 drop into both eyes daily as needed (irritation).     polyethylene glycol (MIRALAX  / GLYCOLAX ) 17 g packet Take 17 g by mouth daily as needed for moderate constipation.     Semaglutide ,0.25 or 0.5MG /DOS, (OZEMPIC , 0.25 OR 0.5 MG/DOSE,) 2 MG/3ML SOPN Inject 0.25 mg into the skin once a week. Via NovoCares Patient Assistance.     valACYclovir (VALTREX) 500 MG tablet Take 500 mg by mouth 2 (two) times daily as needed (fever blisters).     No current facility-administered medications on file prior to visit.    Family History  Problem Relation Age of Onset   Diabetes Mother    Other Neg Hx    Breast cancer Neg Hx     Social History   Socioeconomic History   Marital status: Single    Spouse name: Not on file   Number of children: 1   Years of education: 12   Highest education level: High school graduate  Occupational History   Occupation: Lobbyist: [employer]  Tobacco Use   Smoking status: Former    Current packs/day: 0.00    Types: Cigarettes    Quit date: 10/15/1982    Years since quitting: 41.2   Smokeless tobacco: Never  Vaping Use   Vaping status: Never Used  Substance and Sexual Activity   Alcohol use: No    Alcohol/week: 0.0 standard drinks of alcohol   Drug use: No   Sexual activity: Not Currently    Birth control/protection: Post-menopausal  Other Topics Concern   Not on file  Social History Narrative   Financial assistance approved for 100% discount at [facility] and has [facility] card per [employer]   02/12/10         Social Drivers of Health   Financial Resource Strain: Low Risk  (03/25/2022)   Overall Financial Resource Strain (CARDIA)    Difficulty of Paying Living Expenses: Not hard at all  Food Insecurity: No Food Insecurity (03/03/2023)   Hunger Vital Sign    Worried About Running  Out of Food in the Last Year: Never true    Ran Out of Food in the Last Year: Never true  Transportation Needs: No Transportation Needs (03/03/2023)   PRAPARE - Administrator, Civil Service (Medical): No    Lack of Transportation (Non-Medical): No  Physical Activity: Not on file  Stress: Not on file  Social Connections: Moderately Isolated (03/25/2022)   Social Connection and  Isolation Panel    Frequency of Communication with Friends and Family: Twice a week    Frequency of Social Gatherings with Friends and Family: Once a week    Attends Religious Services: 1 to 4 times per year    Active Member of [employer] or Organizations: No    Attends Banker Meetings: Never    Marital Status: Never married  Intimate Partner Violence: Not At Risk (12/01/2022)   Humiliation, Afraid, Rape, and Kick questionnaire    Fear of Current or Ex-Partner: No    Emotionally Abused: No    Physically Abused: No    Sexually Abused: No    Review of Systems: ROS negative except for what is noted on the assessment and plan.  Vitals:   01/18/24 1516  BP: (!) 153/85  Pulse: 75  Temp: 98.3 F (36.8 C)  TempSrc: Oral  SpO2: 100%  Weight: 199 lb 9.6 oz (90.5 kg)  Height: 5' 8 (1.727 m)    Physical Exam: Constitutional: well-appearing female in no acute distress MSK: Tenderness in right fifth digit and over PIP and MCP.  Some erythema present as well extending down hand.  Range of motion intact.  Swelling present    Assessment & Plan:   Localized swelling of finger of right hand Acute swelling and pain in the right pinky joint with redness suggests inflammation.  Given acute onset and worsening, favoring gout gout over inflammatory arthropathies, or cellulitis.  Will treat with colchicine for now, instructed patient that if symptoms do not improve, or erythema gets worse and extends up her and even more to return to clinic.  - Prescribed colchicine for inflammation. Instructed to  take until symptoms improve, then stop. - Advised to return if symptoms do not improve or if redness worsens or spreads. - Sent prescription to [facility] in the [facility].  Dysuria Painful urination ongoing for the last 3 days, she has felt this before and was diagnosed with a urinary tract infection.  Will check UA today.   Patient discussed with Dr. Karna Dirks Victoire Deans, M.D. [facility] Medicine, PGY-3 Pager: [phone] Date 01/19/2024 Time 8:05 AM

## 2024-01-19 NOTE — Assessment & Plan Note (Signed)
 Painful urination ongoing for the last 3 days, she has felt this before and was diagnosed with a urinary tract infection.  Will check UA today.

## 2024-01-20 LAB — LAB REPORT - SCANNED
Creatinine, POC: 243.1 mg/dL
EGFR: 30

## 2024-01-20 NOTE — Progress Notes (Signed)
 Internal Medicine Clinic Attending  Case discussed with the resident at the time of the visit.  We reviewed the resident's history and exam and pertinent patient test results.  I agree with the assessment, diagnosis, and plan of care documented in the resident's note.  No trauma or injury to the area to suggest infection. No systemic symptoms reported. ED visit earlier this year for similar acute redness and pain in the right forefoot/first toe which was treated with prednisone  for possible gout and antibiotics for possible cellulitis. Today's location is somewhat unusual but suspect gout based on clinical picture and history. Return precautions discussed by Dr. Nooruddin.

## 2024-01-21 ENCOUNTER — Other Ambulatory Visit (HOSPITAL_COMMUNITY): Payer: Self-pay

## 2024-01-21 ENCOUNTER — Ambulatory Visit: Payer: Self-pay | Admitting: Student

## 2024-01-21 LAB — URINE CULTURE, REFLEX

## 2024-01-21 LAB — UA/M W/RFLX CULTURE, ROUTINE
Bilirubin, UA: NEGATIVE
Nitrite, UA: NEGATIVE
Specific Gravity, UA: 1.029 (ref 1.005–1.030)
Urobilinogen, Ur: 1 mg/dL (ref 0.2–1.0)
pH, UA: 5.5 (ref 5.0–7.5)

## 2024-01-21 LAB — MICROSCOPIC EXAMINATION
Casts: NONE SEEN /LPF
Epithelial Cells (non renal): >10 /HPF — AB (ref 0–10)
RBC, Urine: >30 /HPF — AB (ref 0–2)
WBC, UA: >30 /HPF — AB (ref 0–5)

## 2024-01-21 MED ORDER — NITROFURANTOIN MONOHYD MACRO 100 MG PO CAPS
100.0000 mg | ORAL_CAPSULE | Freq: Two times a day (BID) | ORAL | 0 refills | Status: AC
  Filled 2024-01-21: qty 10, 5d supply, fill #0

## 2024-01-25 ENCOUNTER — Other Ambulatory Visit (HOSPITAL_COMMUNITY): Payer: Self-pay | Admitting: Nephrology

## 2024-01-26 ENCOUNTER — Telehealth: Payer: Self-pay

## 2024-01-26 NOTE — Progress Notes (Signed)
 Patient left voicemail with question about Ozempic  injection. Returned patient's call. We reviewed administration and storage instructions for Ozempic  and changes in her other medications that she will make once she starts Ozempic . She plans to start this Friday 01/29/24. Reports she has not started yet because she has been nauseous due to having work done on her mouth recently.   All questions were answered. Will follow-up with patient as scheduled on 02/15/24.   Lorain Baseman, PharmD [facility] [phone]

## 2024-02-01 ENCOUNTER — Telehealth: Payer: Self-pay

## 2024-02-01 NOTE — Telephone Encounter (Signed)
 Patient telephoned and requested a mammogram scholarship application. Verified patient's address and mailed application. [facility]

## 2024-02-02 ENCOUNTER — Other Ambulatory Visit (HOSPITAL_COMMUNITY): Payer: Self-pay

## 2024-02-02 ENCOUNTER — Encounter (HOSPITAL_COMMUNITY): Payer: Self-pay | Admitting: Nephrology

## 2024-02-03 ENCOUNTER — Ambulatory Visit (HOSPITAL_COMMUNITY)
Admission: RE | Admit: 2024-02-03 | Discharge: 2024-02-03 | Disposition: A | Discharge status: Home / Self Care | Payer: Self-pay | Source: Ambulatory Visit | Attending: Nephrology | Admitting: Nephrology

## 2024-02-03 ENCOUNTER — Other Ambulatory Visit (HOSPITAL_COMMUNITY): Payer: Self-pay

## 2024-02-03 MED ORDER — MAGNESIUM SULFATE 2 GM/50ML IV SOLN
2.0000 g | Freq: Once | INTRAVENOUS | Status: DC
  Administered 2024-02-03: 2 g via INTRAVENOUS
  Filled 2024-02-03: qty 50

## 2024-02-04 ENCOUNTER — Other Ambulatory Visit (HOSPITAL_COMMUNITY): Payer: Self-pay

## 2024-02-09 ENCOUNTER — Other Ambulatory Visit: Payer: Self-pay

## 2024-02-09 ENCOUNTER — Telehealth: Payer: Self-pay

## 2024-02-09 ENCOUNTER — Telehealth: Payer: Self-pay | Admitting: *Deleted

## 2024-02-09 NOTE — Telephone Encounter (Signed)
 Copied from CRM [id number]. Topic: General - Other >> Feb 08, 2024  3:35 PM Debby BROCKS wrote: Reason for CRM: Patient stated that she would like to speak to [facility] when possible. Did elaborate as to what the call would be about as it was something personal that [place] was aware of

## 2024-02-09 NOTE — Telephone Encounter (Signed)
 Patient left a VM on my direct line requesting a call back.   Returned call today. Patient is concerned about starting Ozempic  because she is still having ongoing nausea and vomiting. Nurse at the nephrology office recommended to hold off on starting Ozempic  until n/v resolved. She recently had magnesium  infusion, which she anticipates will help her symptoms.  Discussed planning for Ozempic  start date on December 5th, as long as her GI symptoms have subsided. Will move follow-up telephone call to 02/29/24. Reports she has enough of her Janumet  to last her until this date.   Lorain Baseman, PharmD [facility] [phone]

## 2024-02-09 NOTE — Telephone Encounter (Signed)
 Returned call to pt No answer Left message on recorder for return call

## 2024-02-15 ENCOUNTER — Other Ambulatory Visit: Payer: Self-pay

## 2024-02-15 NOTE — Telephone Encounter (Addendum)
 This patient has no ins and has a [employer].  Referral sent to the following office to f/u with Referral Request   [facility] [place], [place]  Address: [address] [place], [place], [place] [address]  Copied from CRM #[id number]. Topic: Referral - Request for Referral >> Jan 29, 2024  9:59 AM Debby BROCKS wrote: Did the patient discuss referral with their provider in the last year? Yes (If No - schedule appointment) (If Yes - send message)  Appointment offered? No  Type of order/referral and detailed reason for visit: Referral for Eye Doctor  Preference of office, provider, location: Patient would prefer [facility]. [facility]  If referral order, have you been seen by this specialty before? No (If Yes, this issue or another issue? When? Where?  Can we respond through MyChart? Yes

## 2024-02-23 ENCOUNTER — Other Ambulatory Visit: Payer: Self-pay

## 2024-02-23 ENCOUNTER — Encounter: Payer: Self-pay | Admitting: Student

## 2024-02-23 DIAGNOSIS — Z1231 Encounter for screening mammogram for malignant neoplasm of breast: Secondary | ICD-10-CM

## 2024-02-29 ENCOUNTER — Telehealth: Payer: Self-pay

## 2024-02-29 ENCOUNTER — Other Ambulatory Visit (HOSPITAL_COMMUNITY): Payer: Self-pay

## 2024-02-29 ENCOUNTER — Other Ambulatory Visit: Payer: Self-pay

## 2024-02-29 NOTE — Telephone Encounter (Signed)
 Patient returned my call and left a VM during lunch around 12:40PM. Returned patient's call at 1PM without success. LVM. Patient did state in her message that she has not started Ozempic  due to ongoing nausea and hypomagnesemia. States that her neprhologist also recommended holding off on starting the medication.   Will continue to outreach patient. She is scheduled to see PCP on 03/09/24.   Lorain Baseman, PharmD [facility] [phone]

## 2024-02-29 NOTE — Telephone Encounter (Signed)
 Attempted to contact patient for scheduled appointment for medication management. Left HIPAA compliant message for patient to return my call at their convenience.   Lorain Baseman, PharmD [facility] [phone]

## 2024-02-29 NOTE — Progress Notes (Deleted)
 02/29/2024 Name: ABBEGAIL MATUSKA MRN: [id number] DOB: Oct 13, 1960  No chief complaint on file.   BROCK MOKRY is a 63 y.o. year old female who was referred for medication management by their primary care provider, Elnora Ip, MD. They presented for a face to face visit today.   They were referred to the pharmacist by their PCP for assistance in managing diabetes . PMH includes HTN, PAD, GERD, T2DM, CKD3, HLD.    Subjective: Patient was last seen by PCP, Dr. Kristy on 12/02/23. At last visit, assisted patient in obtaining Ozempic  via PAP. Instructed patient to decrease Lantus  to 24 units daily. Once Ozempic  arrived, patient was to stop Janumet  and replace with metformin . Patient was scheduled with pharmacy to review changes once Ozempic  arrived. At pharmacy appt on 01/18/24, patient was given her Ozempic  and we reviewed instructions for medication changes. Patient has contacted me multiple times since then to discuss that she has not started Ozempic  yet because she has ongoing nausea.   Today, ***  Today, patient presents in  good spirits and presents without any assistance. She is also needing to see the physician for an acute gout flare. She brings her glucometer and med list for review. She reports she has seen her kidney doctor recently who has suggested decreasing metformin  to 500 mg BID when she stops Janumet . She also continues to have s/sx of acid reflux and frequent belching. Reports she has not started esomeprazole yet because it says esomeprazole magnesium  and she wanted to check with neprhology to make sure she can take this with her magnesium  supplement.   Care Team: Primary Care Provider: Elnora Ip, MD ; Next Scheduled Visit: 03/09/24  Medication Access/Adherence  Current Pharmacy:  [facility] [place], [place] - [address] [place] [address] [place] [address] Phone: [phone] Fax:  [phone]  [place] [facility] [address] [place] [address] Phone: [phone] Fax: [phone]  [facility] [address] ([place]), [place] - [address] [place]) [place] [address] Phone: [phone] Fax: [phone]  [facility] [address] [place] [address] Phone: [phone] Fax: [phone]  [facility] [place], [place] - [address] [place] [address] Phone: [phone] Fax: [phone]   Patient reports affordability concerns with their medications: Yes  - uninsured, uses [facility] and [facility]. NovoCares approved through Feb 2026. Patient reports access/transportation concerns to their pharmacy: No  Patient reports adherence concerns with their medications:  No     Diabetes:  Current medications: Lantus  24 units daily (at bedtime), Humalog  2-3 units with breakfast: 2 units with lunch - 1-2 units with dinner, Janumet  50-1000 mg BID, Invokana  100 mg daily  Current glucose readings:  01/18/24 - AM 122, 12PM 139 mg/dL 88/7/74: 5PM 871 mg/dL, 87JF 88, 6AM 91 88/8/74: 4PM 129 mg/dL, 88JF 806, 5AM 82 mg/dL > 856 mg/dL 89/68: 4PM 893, 1PM 860 mg/dL, 885 mg/dL, 5AM 99, 5AM 85 89/69: 4PM 123, 11AM 208, 5AM 135,  10/29: 4PM 180, 11AM 167, 5AM 209 10/28: 4PM 228, 11AM 153 mg/dL, 3AM 858,  89/72: 10 PM 160, 3PM 173, 1:40: 173 mg/dL ----------------------- 10/20: 5:10AM - 69 mg/dL  Patient reports hypoglycemic s/sx including just feeling a little off - denies being shaky or sweaty.  Patient denies hyperglycemic symptoms including polyuria, polydipsia, polyphagia, nocturia, neuropathy, blurred vision.  Current  meal patterns: 3 meals/day - tries to eat protein with each  - Drinks: water throughout the day  Current physical activity: did not discuss today  Macrovascular and Microvascular Risk  Reduction:  Statin? yes (atorvastatin  40 mg daily); ACEi/ARB? yes (amlodipine -olmesartan  10-20 mg daily) Last urinary albumin/creatinine ratio:  Lab Results  Component Value Date   MICRALBCREAT 52 (H) 09/01/2023   MICRALBCREAT 69 (H) 08/13/2022   MICRALBCREAT 44 (H) 01/29/2021   MICRALBCREAT 33.2 (H) 01/21/2018   MICRALBCREAT 39.3 (H) 09/09/2017   MICRALBCREAT 322.1 (H) 07/19/2014   MICRALBCREAT 59.5 (H) 11/10/2011   MICRALBCREAT 65.1 (H) 03/06/2009   MICRALBCREAT 203.2 (H) 01/05/2008   Last eye exam:  Lab Results  Component Value Date   HMDIABEYEEXA Retinopathy (A) 05/13/2021   Last foot exam: 05/26/2023 Tobacco Use:  Tobacco Use: Medium Risk (01/12/2024)   Patient History    Smoking Tobacco Use: Former    Smokeless Tobacco Use: Never    Passive Exposure: Not on file     Objective:  BP Readings from Last 3 Encounters:  02/03/24 (!) 147/86  01/18/24 (!) 153/85  01/12/24 (!) 160/80    Lab Results  Component Value Date   HGBA1C 7.4 (A) 12/02/2023   HGBA1C 8.0 (A) 09/01/2023   HGBA1C 7.7 (A) 05/26/2023       Latest Ref Rng & Units 09/01/2023   11:07 AM 02/25/2023   11:01 AM 09/11/2022   10:53 AM  BMP  Glucose 70 - 99 mg/dL 94  886  859   BUN 8 - 27 mg/dL 19  21  23    Creatinine 0.57 - 1.00 mg/dL 8.81  8.87  8.62   BUN/Creat Ratio 12 - 28 16  19  17    Sodium 134 - 144 mmol/L 140  136  138   Potassium 3.5 - 5.2 mmol/L 4.1  4.3  4.2   Chloride 96 - 106 mmol/L 100  98  102   CO2 20 - 29 mmol/L 18  21  19    Calcium  8.7 - 10.3 mg/dL 89.9  9.7  9.9     Lab Results  Component Value Date   CHOL 86 (L) 12/01/2022   HDL 39 (L) 12/01/2022   LDLCALC 29 12/01/2022   TRIG 93 12/01/2022   CHOLHDL 2.2 12/01/2022    Medications Reviewed Today   Medications were not reviewed in this encounter       Assessment/Plan:   Diabetes: - Currently uncontrolled with most recent A1C of 7.4% above goal <7%. Medication adherence appears appropriate. Patient reports some  episodes of AM hypoglycemia. Will slightly reduce basal insulin  as she starts Ozempic . Encouraged her to monitor for worsening acid reflux or constipation with starting GLP-1RA. Advised her that it is ok to take esomeprazole with magnesium  supplement, but she can wait for response from nephrology. Encouraged her to eat small meals throughout the day and stay hydrated with water to avoid GI AE. Unable to see most recent BMP, but patient said her nephrologist recommended metformin  TDD of 1000 mg, so will dose-reduce today. Confirmed patient can cut metformin  IR 1000 mg tablets in half (says she has a 60 day supply at home). - Last UACR June 2025 - 52 mg/g - Reviewed long term cardiovascular and renal outcomes of uncontrolled blood sugar - Reviewed goal A1c, goal fasting, and goal 2 hour post prandial glucose - Reviewed hypoglycemia management plan and the rule of 15 - Reviewed dietary modifications including  utilizing the healthy plate method, limiting portion size of  carbohydrate foods, increasing intake of protein and non-starchy vegetables. Counseled patient to stay hydrated with water throughout the day. - Reviewed lifestyle modifications including: aiming for 150 minutes of moderate intensity exercise every week.  - Recommend to start Ozempic  0.25 mg weekly x 4 weeks then increase to 0.5 mg weekly.  - Recommend to stop Janumet  and start metformin  IR 500 mg BID - Recommend to continue Invokana  100 mg daily - Recommend to decrease Lantus  to 22 units daily. May decrease to 20 units daily if she has recurrent hypoglycemia.  - Recommend to continue Humalog  2-4 units TID with meals - Recommend to check glucose TID Counseled patient to bring glucometer or BG log to every appointment. - Next A1C due 03/02/24     Unable to address HTN today as patient was also seeing physician for acute gout flare. Plan to address at follow-up.   Written patient instructions provided. Patient verbalized understanding  of treatment plan.   Follow Up Plan:  Pharmacist telephone 02/15/24 PCP clinic visit 03/09/24   Lorain Baseman, PharmD [facility] [phone]

## 2024-03-03 ENCOUNTER — Telehealth: Payer: Self-pay | Admitting: *Deleted

## 2024-03-03 NOTE — Telephone Encounter (Signed)
 Copied from CRM #[id number]. Topic: General - Other >> Mar 03, 2024  3:42 PM Miquel SAILOR wrote: Reason for CRM: Patient only wants to speak with offcie or nurse. Will not give any nfo on what the call is for  . She states Personal. Called and transferred to [facility]

## 2024-03-03 NOTE — Telephone Encounter (Signed)
 Called pt - no answer; left message of office's return call. Unsure if call was transferred to [facility] as stated in CRM's note.

## 2024-03-03 NOTE — Telephone Encounter (Signed)
 I spoke with pt, she following up on referral to eye doctor. Forwarding message to [place].

## 2024-03-09 ENCOUNTER — Encounter: Payer: Self-pay | Admitting: Student

## 2024-03-09 ENCOUNTER — Ambulatory Visit: Payer: Self-pay | Admitting: Student

## 2024-03-09 ENCOUNTER — Other Ambulatory Visit: Payer: Self-pay

## 2024-03-09 VITALS — BP 147/74 | HR 72 | Temp 98.4°F | Ht 68.0 in | Wt 199.4 lb

## 2024-03-09 DIAGNOSIS — N1832 Chronic kidney disease, stage 3b: Secondary | ICD-10-CM

## 2024-03-09 DIAGNOSIS — I1A0 Resistant hypertension: Secondary | ICD-10-CM

## 2024-03-09 DIAGNOSIS — E1121 Type 2 diabetes mellitus with diabetic nephropathy: Secondary | ICD-10-CM

## 2024-03-09 DIAGNOSIS — E1122 Type 2 diabetes mellitus with diabetic chronic kidney disease: Secondary | ICD-10-CM

## 2024-03-09 DIAGNOSIS — K5130 Ulcerative (chronic) rectosigmoiditis without complications: Secondary | ICD-10-CM

## 2024-03-09 DIAGNOSIS — K219 Gastro-esophageal reflux disease without esophagitis: Secondary | ICD-10-CM

## 2024-03-09 DIAGNOSIS — E119 Type 2 diabetes mellitus without complications: Secondary | ICD-10-CM

## 2024-03-09 DIAGNOSIS — Z794 Long term (current) use of insulin: Secondary | ICD-10-CM

## 2024-03-09 LAB — POCT GLYCOSYLATED HEMOGLOBIN (HGB A1C): HbA1c, POC (controlled diabetic range): 7.2 % — AB (ref 0.0–7.0)

## 2024-03-09 LAB — GLUCOSE, CAPILLARY: Glucose-Capillary: 137 mg/dL — ABNORMAL HIGH (ref 70–99)

## 2024-03-09 MED ORDER — BASAGLAR KWIKPEN 100 UNIT/ML ~~LOC~~ SOPN: 22.0000 [IU] | PEN_INJECTOR | Freq: Every day | SUBCUTANEOUS | 5 refills | Status: AC

## 2024-03-09 MED ORDER — INSULIN LISPRO (1 UNIT DIAL) 100 UNIT/ML (KWIKPEN): PEN_INJECTOR | SUBCUTANEOUS | 5 refills | Status: AC

## 2024-03-09 NOTE — Assessment & Plan Note (Addendum)
 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 Patient discussed results with [facility]; no plans for mesalamine or other medication. No currently on flare or with active symptoms. Next colonoscopy in 3 years unless new or worsening symptoms

## 2024-03-09 NOTE — Patient Instructions (Addendum)
 Thank you, Ashley Long for allowing us  to provide your care today. Today we discussed   Low magnesium    - we are checking your labs - Continue with the magnesium  Oxide 400 mg twice daily  Ulcerative colitis - Mild inflammation on the colonoscopy. Let us  know if you have worsening diarrhea, blood in the stool or abdominal pain. You should also call your GI doctor if this happens  GERD symptoms - You are now on esomeprazole - it is a cousin  of the Protonix . I am glad that the symptoms are better but this may also be decreasing your Magnesium . So we need to continue monitoring  Kidney disease _ follow up with Dr. Jerrye   Diabetes and GLP1 use - I will let Dr. Brinda know that you are going to continue with the Janumet . I will ask her to follow up on your Magnesium  next week. If it is normal with the pills, I will ask her to guide you through the transition the GLP1 pen.  Blood pressure: Good on your home readings, mildly elevated here. No changes to your medication      I have ordered the following labs for you:   Lab Orders         Comprehensive metabolic panel with GFR         Magnesium          POC Hbg A1C      I will call if any are abnormal. All of your labs can be accessed through My Chart.   My Chart Access: [email or web address]  Please follow-up in: 8 weeks unless there are abnormalities in your lab work. Please follow up with Dr. Brinda in our clinic to see if we can start the GLP1 (injected, once weekly medication)    We look forward to seeing you next time. Please call our clinic at [phone] if you have any questions or concerns. The best time to call is Monday-Friday from 9am-4pm, but there is someone available 24/7. If after hours or the weekend, call the main hospital number and ask for the Internal Medicine Resident On-Call. If you need medication refills, please notify your pharmacy one week in advance and they  will send us  a request.   Thank you for letting us  take part in your care. Wishing you the best!  Elnora Ip, MD 03/09/2024, 10:04 AM [facility]

## 2024-03-09 NOTE — Assessment & Plan Note (Signed)
 Previously uncontrolled, improved with transition to esomeprazole BID and discontinuation of protonix  and famotidine . Followed by [facility].

## 2024-03-09 NOTE — Assessment & Plan Note (Signed)
 Monitoring with Dr. Katheryn Saba at [facility]

## 2024-03-09 NOTE — Progress Notes (Signed)
 "   Subjective:  CC: Follow up  HPI:  [name] is a 63 y.o. female with a past medical history stated below and presents today for follow up. Please see problem based assessment and plan for additional details.  Past Medical History:  Diagnosis Date   Chronic kidney disease (CKD), stage III (moderate) (HCC)    DIABETES MELLITUS, TYPE II 06/02/2006   Last HBA1C 8.7. , check 3 times daily   Epidermal inclusion cyst 03/23/2020   Hematoma of right breast 04/10/2021   Hydronephrosis    sees Dr Jerrye at [place] [facility] ,   HYPERLIPIDEMIA 06/02/2006   Qualifier: Diagnosis of  By: Gust MD, Dorn     HYPERTENSION 06/02/2006   Filed Vitals:   01/21/11 1134  BP: 162/98  Pulse: 80  Temp: 97.4 F (36.3 C)  TempSrc: Oral  Resp: 20  Height: 5' 8 (1.727 m)  Weight: 261 lb (118.389 kg)      Persistent proteinuria 01/21/2018   Personal history of colonic polyps 11/09/2018    Medications Ordered Prior to Encounter[1]  Family History  Problem Relation Age of Onset   Diabetes Mother    Other Neg Hx    Breast cancer Neg Hx     Social History   Socioeconomic History   Marital status: Single    Spouse name: Not on file   Number of children: 1   Years of education: 12   Highest education level: High school graduate  Occupational History   Occupation: Lobbyist: [employer]  Tobacco Use   Smoking status: Former    Current packs/day: 0.00    Types: Cigarettes    Quit date: 10/15/1982    Years since quitting: 41.4   Smokeless tobacco: Never  Vaping Use   Vaping status: Never Used  Substance and Sexual Activity   Alcohol use: No    Alcohol/week: 0.0 standard drinks of alcohol   Drug use: No   Sexual activity: Not Currently    Birth control/protection: Post-menopausal  Other Topics Concern   Not on file  Social History Narrative   Financial assistance approved for 100% discount at [facility] and has [facility] card per [employer]   02/12/10          Social Drivers of Health   Tobacco Use: Medium Risk (03/09/2024)   Patient History    Smoking Tobacco Use: Former    Smokeless Tobacco Use: Never    Passive Exposure: Not on Actuary Strain: Low Risk (03/25/2022)   Overall Financial Resource Strain (CARDIA)    Difficulty of Paying Living Expenses: Not hard at all  Food Insecurity: No Food Insecurity (03/03/2023)   Hunger Vital Sign    Worried About Running Out of Food in the Last Year: Never true    Ran Out of Food in the Last Year: Never true  Transportation Needs: No Transportation Needs (03/03/2023)   PRAPARE - Administrator, Civil Service (Medical): No    Lack of Transportation (Non-Medical): No  Physical Activity: Not on file  Stress: Not on file  Social Connections: Moderately Isolated (03/25/2022)   Social Connection and Isolation Panel    Frequency of Communication with Friends and Family: Twice a week    Frequency of Social Gatherings with Friends and Family: Once a week    Attends Religious Services: 1 to 4 times per year    Active Member of Clubs or Organizations: No    Attends  Club or Organization Meetings: Never    Marital Status: Never married  Intimate Partner Violence: Not At Risk (12/01/2022)   Humiliation, Afraid, Rape, and Kick questionnaire    Fear of Current or Ex-Partner: No    Emotionally Abused: No    Physically Abused: No    Sexually Abused: No  Depression (PHQ2-9): Low Risk (03/09/2024)   Depression (PHQ2-9)    PHQ-2 Score: 0  Alcohol Screen: Not on file  Housing: Low Risk (12/01/2022)   Housing    Last Housing Risk Score: 0  Utilities: Not At Risk (03/25/2022)   AHC Utilities    Threatened with loss of utilities: No  Health Literacy: Not on file    Review of Systems: ROS negative except for what is noted on the assessment and plan.  Objective:   Vitals:   03/09/24 1004 03/09/24 1009  BP: (!) 148/78 (!) 147/74  Pulse: 78 72  Temp: 98.4 F (36.9 C)   TempSrc:  Oral   SpO2: 99%   Weight: 199 lb 6.4 oz (90.4 kg)   Height: 5' 8 (1.727 m)     Physical Exam: Constitutional: well-appearing woman sitting in chair, in no acute distress HENT: normocephalic atraumatic, mucous membranes moist Eyes: conjunctiva non-erythematous Cardiovascular: regular rate and rhythm, no m/r/g Pulmonary/Chest: normal work of breathing on room air, lungs clear to auscultation bilaterally Abdominal: soft, non-tender, non-distended, normal bowel sounds MSK: normal bulk and tone Neurological: alert & oriented x 3, normal gait Skin: warm and dry Psych: Pleasant mood and affect       03/09/2024   10:08 AM  Depression screen PHQ 2/9  Decreased Interest 0  Down, Depressed, Hopeless 0  PHQ - 2 Score 0    Assessment & Plan:   Ulcerative (chronic) proctosigmoiditis (HCC) 2025 Colonoscopy Sigmoid and rectum: Mildly active chronic colitis consistent with inflammatory bowel disease. Patient discussed results with [facility]; no plans for mesalamine or other medication. No currently on flare or with active symptoms. Next colonoscopy in 3 years unless new or worsening symptoms  Resistant hypertension Midly elevated today in office, also seen on home cuff. Home readings with systolic 120s and diastolic BP below 80. Will continue current, up to date regimen.  - Follow up K given recent history of hypomagnesemia  Hypomagnesemia Recent significant hypomagnesemia s/p IV infusion and continued 400 mg BID supplementation. No recheck in the system. No n/v/diarrhea recently. Etiology likely prolonged use of PPI in setting of severe GERD. Unfortunately, unable to stop given she is quite symptomatic.  - Will check lab and continue the oral Magnesium .   GERD (gastroesophageal reflux disease) Previously uncontrolled, improved with transition to esomeprazole BID and discontinuation of protonix  and famotidine . Followed by [facility].   Diabetes mellitus with diabetic nephropathy (HCC) Lab  Results  Component Value Date   HGBA1C 7.2 (A) 03/09/2024   HGBA1C 7.4 (A) 12/02/2023   HGBA1C 8.0 (A) 09/01/2023   Slowly improving. Currently on Janumet , Invokana , Insulin  glargine 22 units daily, short acting 3 units with breakfast and lunch, and 1-2 dinner. She has not started the GLP-1 medication as she had been having nausea and vomiting thought to be in setting of hypomagnesemia. Currently asymptomatic.  We will check the serum Mg and go from there to assess transition to GLP1.  I will route note to Dr. Brinda since I won't be here in the clinic. If the Mg is >2, I will ask her to help patient transition to GLP1. I have asked the patient to  be seen in clinic in ~ 8 weeks.    CKD stage 3b, GFR 30-44 ml/min [facility]) Monitoring with Dr. [facility]atheryn [facility] at [facility]    Return in about 8 weeks (around 05/04/2024) for Chronic medical conditions.  Patient discussed with Dr. CHARLENA Rosan Hadassah Kristy Rosario, MD [facility]  03/09/2024, 11:48 AM         [1]  Current Outpatient Medications on File Prior to Visit  Medication Sig Dispense Refill   acetaminophen  (TYLENOL ) 500 MG tablet Take 500 mg by mouth every 6 (six) hours as needed for moderate pain.     amlodipine -olmesartan  (AZOR ) 10-20 MG tablet Take 1 tablet by mouth daily 90 tablet 3   ascorbic acid (VITAMIN C) 500 MG tablet Take 500 mg by mouth daily.     ASPIRIN  EC ADULT LOW DOSE 81 MG tablet TAKE 1 Tablet BY MOUTH ONCE DAILY 90 tablet 3   atorvastatin  (LIPITOR) 40 MG tablet TAKE 1 Tablet BY MOUTH ONCE DAILY 90 tablet 3   Blood Glucose Monitoring Suppl (WAVESENSE PRESTO) W/DEVICE KIT 1 each by Does not apply route 2 (two) times daily. 1 each 0   carvedilol  (COREG ) 25 MG tablet TAKE 1 Tablet  BY MOUTH TWICE DAILY 180 tablet 3   chlorthalidone  (HYGROTON ) 25 MG tablet Take 1 tablet (25 mg total) by mouth daily 90 tablet 2   colchicine  0.6 MG tablet Take 2  tablets initially, then 0.6 mg orally one hour  later on day 1; then 0.6 mg orally once or twice daily starting on day 2, for a total of 7 to 10 days or until symptoms improve 30 tablet 0   glucose blood (WAVESENSE PRESTO) test strip Use to test blood sugar 3 times daily. diag code E11.9. Insulin  dependent 100 each 11   HYDROcodone -acetaminophen  (NORCO/VICODIN) 5-325 MG tablet Take 1 tablet by mouth every 4 (four) hours as needed. 10 tablet 0   Insulin  Pen Needle (PEN NEEDLES) 31G X 5 MM MISC 4 Doses/Fill by Does not apply route daily. 100 each 11   Insulin  Syringe-Needle U-100 31G X 15/64 0.3 ML MISC 1 each by Does not apply route 2 (two) times daily with a meal. 100 each 11   INVOKANA  100 MG TABS tablet Take 100 mg by mouth daily.     Lancets 30G MISC 1 each by Does not apply route 2 (two) times daily. 100 each 12   magnesium  oxide (MAG-OX) 400 MG tablet Take 400 mg by mouth daily.     metFORMIN  (GLUCOPHAGE ) 500 MG tablet Take 1 tablet (500 mg total) by mouth 2 (two) times daily with a meal.     Polyethyl Glycol-Propyl Glycol (SYSTANE OP) Place 1 drop into both eyes daily as needed (irritation).     polyethylene glycol (MIRALAX  / GLYCOLAX ) 17 g packet Take 17 g by mouth daily as needed for moderate constipation.     Semaglutide ,0.25 or 0.5MG /DOS, (OZEMPIC , 0.25 OR 0.5 MG/DOSE,) 2 MG/3ML SOPN Inject 0.25 mg into the skin once a week. Via [employer] Patient Assistance. (Patient not taking: Reported on 01/26/2024)     valACYclovir (VALTREX) 500 MG tablet Take 500 mg by mouth 2 (two) times daily as needed (fever blisters).     No current facility-administered medications on file prior to visit.   "

## 2024-03-09 NOTE — Assessment & Plan Note (Signed)
 Recent significant hypomagnesemia s/p IV infusion and continued 400 mg BID supplementation. No recheck in the system. No n/v/diarrhea recently. Etiology likely prolonged use of PPI in setting of severe GERD. Unfortunately, unable to stop given she is quite symptomatic.  - Will check lab and continue the oral Magnesium .

## 2024-03-09 NOTE — Assessment & Plan Note (Signed)
 Lab Results  Component Value Date   HGBA1C 7.2 (A) 03/09/2024   HGBA1C 7.4 (A) 12/02/2023   HGBA1C 8.0 (A) 09/01/2023   Slowly improving. Currently on Janumet , Invokana , Insulin  glargine 22 units daily, short acting 3 units with breakfast and lunch, and 1-2 dinner. She has not started the GLP-1 medication as she had been having nausea and vomiting thought to be in setting of hypomagnesemia. Currently asymptomatic.  We will check the serum Mg and go from there to assess transition to GLP1.  I will route note to Dr. Brinda since I won't be here in the clinic. If the Mg is >2, I will ask her to help patient transition to GLP1. I have asked the patient to be seen in clinic in ~ 8 weeks.

## 2024-03-09 NOTE — Assessment & Plan Note (Signed)
 Midly elevated today in office, also seen on home cuff. Home readings with systolic 120s and diastolic BP below 80. Will continue current, up to date regimen.  - Follow up K given recent history of hypomagnesemia

## 2024-03-10 LAB — COMPREHENSIVE METABOLIC PANEL WITH GFR
ALT: 59 IU/L — ABNORMAL HIGH (ref 0–32)
AST: 63 IU/L — ABNORMAL HIGH (ref 0–40)
Albumin: 3.9 g/dL (ref 3.9–4.9)
Alkaline Phosphatase: 109 IU/L (ref 49–135)
BUN/Creatinine Ratio: 11 — ABNORMAL LOW (ref 12–28)
BUN: 13 mg/dL (ref 8–27)
Bilirubin Total: 0.3 mg/dL (ref 0.0–1.2)
CO2: 22 mmol/L (ref 20–29)
Calcium: 10 mg/dL (ref 8.7–10.3)
Chloride: 101 mmol/L (ref 96–106)
Creatinine, Ser: 1.2 mg/dL — ABNORMAL HIGH (ref 0.57–1.00)
Globulin, Total: 2.8 g/dL (ref 1.5–4.5)
Glucose: 145 mg/dL — ABNORMAL HIGH (ref 70–99)
Potassium: 4.6 mmol/L (ref 3.5–5.2)
Sodium: 138 mmol/L (ref 134–144)
Total Protein: 6.7 g/dL (ref 6.0–8.5)
eGFR: 51 mL/min/1.73 — ABNORMAL LOW

## 2024-03-10 LAB — MAGNESIUM: Magnesium: 1.6 mg/dL (ref 1.6–2.3)

## 2024-03-14 ENCOUNTER — Other Ambulatory Visit (HOSPITAL_COMMUNITY): Payer: Self-pay

## 2024-03-15 ENCOUNTER — Telehealth: Payer: Self-pay

## 2024-03-15 ENCOUNTER — Telehealth: Payer: Self-pay | Admitting: *Deleted

## 2024-03-15 NOTE — Progress Notes (Signed)
 Patient saw Dr. Kristy on 03/09/24. She reported nausea/vomiting had resolved. Dr. Kristy rechecked CMP and Mg level, with plan to transition to GLP-1RA, Ozempic , if Mg > 2 mg/dL. Patient had recently received Mg infusion (02/03/24) and has continued oral magnesium  supplement 400 mg twice daily. Mg level was still 1.6 mg/dL. Potassium was WNL and kidney function was stable.   Discussed lab results with patient. Requested most recent labs (Nov 2025) from [facility] to be faxed to [facility] for comparison. If there was improvement in her Mg level compared to Nov and she remains asymptomatic, could consider transitioning to Ozempic . Will discuss with PCP. Advised patient to keep in-person pharmacy appt on 03/31/24. Risks of initiating Ozempic  include developing side effect of vomiting which could contribute to hypomagnesemia.    Patient verbalized understanding. Will follow-up lab records.   Lorain Baseman, PharmD [facility] [phone]

## 2024-03-15 NOTE — Telephone Encounter (Signed)
 Call from pt stating its time to get her eyes checked Pt states she goes to [facility] through the orange card program Requesting [facility] place a referral CMA unfamiliar with the referral process and will forward message to the appropriate staff.  Pt requesting a call back and to leave message on recorder as she is currently at work.

## 2024-03-21 NOTE — Telephone Encounter (Signed)
 LMOM for the patient to call back in reference to any Eye Referral Request as her Referral will be sent out for the month of Jan.  Copied from CRM #[id number]. Topic: General - Other >> Mar 21, 2024  3:43 PM Debby BROCKS wrote: Reason for CRM: Patient would like [place] or [facility] to call her back. She states she spoke with [place] last week so she knows that the phone call will be about and did not want to divulge any information.

## 2024-03-24 ENCOUNTER — Ambulatory Visit
Admission: RE | Admit: 2024-03-24 | Discharge: 2024-03-24 | Disposition: A | Discharge status: Home / Self Care | Payer: Self-pay | Source: Ambulatory Visit | Attending: Internal Medicine | Admitting: Internal Medicine

## 2024-03-24 DIAGNOSIS — Z1231 Encounter for screening mammogram for malignant neoplasm of breast: Secondary | ICD-10-CM

## 2024-03-29 ENCOUNTER — Other Ambulatory Visit (HOSPITAL_COMMUNITY): Payer: Self-pay

## 2024-03-30 ENCOUNTER — Other Ambulatory Visit: Payer: Self-pay

## 2024-03-30 ENCOUNTER — Other Ambulatory Visit: Payer: Self-pay | Admitting: *Deleted

## 2024-03-30 ENCOUNTER — Ambulatory Visit: Payer: Self-pay

## 2024-03-30 ENCOUNTER — Other Ambulatory Visit (HOSPITAL_COMMUNITY): Payer: Self-pay

## 2024-03-30 VITALS — BP 132/82 | HR 68 | Temp 98.4°F | Ht 68.0 in | Wt 195.6 lb

## 2024-03-30 DIAGNOSIS — N950 Postmenopausal bleeding: Secondary | ICD-10-CM

## 2024-03-30 DIAGNOSIS — E1121 Type 2 diabetes mellitus with diabetic nephropathy: Secondary | ICD-10-CM

## 2024-03-30 NOTE — Progress Notes (Signed)
 "  Established Patient Office Visit  Subjective   Patient ID: Ashley Long, female    DOB: Mar 10, 1961  Age: 64 y.o. MRN: [id number]   Patient presents with new vaginal bleeding that started on Saturday and ended on Sunday.  It was enough bleeding to fill a pad.  She had mild suprapubic pain associated with the bleeding that was similar to period Cramps.  She has never experienced anything like this in the past.  She has been in menopause for several years.  She has a history of UC last colonoscopy was a couple months ago, she is not on therapy and says she is not currently in a flare.  She has not had any change with bowel movements, has a remote history of treated external hemorrhoids but does not think that this was where the bleeding came from.  She has some bumps on the outside of her vagina not around the vaginal canal or in her labia.  Has not had any bleeding since Sunday. She has not started or stopped any medications recently, she has been sexually inactive for the last 4 years.  She has not had a hysterectomy or her ovaries removed.  She denies any recent trauma to the genital area and denies changes in urination.  She has been in her otherwise normal state of health until the bleeding episode.  She has lost a lot of weight but has been trying to for her diabetes.   Vaginal Bleeding    Past Medical History:  Diagnosis Date   Chronic kidney disease (CKD), stage III (moderate) (HCC)    DIABETES MELLITUS, TYPE II 06/02/2006   Last HBA1C 8.7. , check 3 times daily   Epidermal inclusion cyst 03/23/2020   Hematoma of right breast 04/10/2021   Hydronephrosis    sees Dr Jerrye at [place] [facility] ,   HYPERLIPIDEMIA 06/02/2006   Qualifier: Diagnosis of  By: Gust MD, Dorn     HYPERTENSION 06/02/2006   Filed Vitals:   01/21/11 1134  BP: 162/98  Pulse: 80  Temp: 97.4 F (36.3 C)  TempSrc: Oral  Resp: 20  Height: 5' 8 (1.727 m)  Weight: 261 lb (118.389 kg)      Persistent  proteinuria 01/21/2018   Personal history of colonic polyps 11/09/2018        Objective:     BP 132/82 (BP Location: Right Arm, Patient Position: Sitting, Cuff Size: Normal)   Pulse 68   Temp 98.4 F (36.9 C) (Oral)   Ht 5' 8 (1.727 m)   Wt 195 lb 9.6 oz (88.7 kg)   LMP 12/25/2015   SpO2 97%   BMI 29.74 kg/m  BP Readings from Last 3 Encounters:  03/30/24 132/82  03/09/24 (!) 147/74  02/03/24 (!) 147/86   Wt Readings from Last 3 Encounters:  03/30/24 195 lb 9.6 oz (88.7 kg)  03/09/24 199 lb 6.4 oz (90.4 kg)  01/18/24 199 lb 9.6 oz (90.5 kg)      Physical Exam Vitals reviewed.  Constitutional:      Appearance: Normal appearance.  HENT:     Head: Normocephalic and atraumatic.     Nose: Nose normal.     Mouth/Throat:     Mouth: Mucous membranes are moist.     Pharynx: Oropharynx is clear.  Eyes:     Conjunctiva/sclera: Conjunctivae normal.  Pulmonary:     Effort: Pulmonary effort is normal.  Abdominal:     General: Bowel sounds are normal. There is no distension.  Palpations: Abdomen is soft. There is no mass.     Tenderness: There is no abdominal tenderness. There is no guarding.     Hernia: No hernia is present.  Genitourinary:    General: Normal vulva.     Exam position: Lithotomy position.     Pubic Area: No rash or pubic lice.      Labia:        Right: No rash, lesion or injury.        Left: No rash, lesion or injury.      Urethra: No urethral swelling or urethral lesion.     Comments: Few erythematous papules associated with hair follicles, mild Musculoskeletal:        General: No swelling.  Skin:    General: Skin is warm.     Findings: No bruising or rash.  Neurological:     General: No focal deficit present.     Mental Status: She is alert and oriented to person, place, and time.  Psychiatric:        Mood and Affect: Mood normal.        Behavior: Behavior normal.      Results for orders placed or performed in visit on 03/30/24  CBC  with Diff  Result Value Ref Range   WBC 7.7 3.4 - 10.8 x10E3/uL   RBC 4.12 3.77 - 5.28 x10E6/uL   Hemoglobin 11.2 11.1 - 15.9 g/dL   Hematocrit 64.2 65.9 - 46.6 %   MCV 87 79 - 97 fL   MCH 27.2 26.6 - 33.0 pg   MCHC 31.4 (L) 31.5 - 35.7 g/dL   RDW 86.1 88.2 - 84.5 %   Platelets 280 150 - 450 x10E3/uL   Neutrophils 48 Not Estab. %   Lymphs 35 Not Estab. %   Monocytes 9 Not Estab. %   Eos 7 Not Estab. %   Basos 1 Not Estab. %   Neutrophils Absolute 3.7 1.4 - 7.0 x10E3/uL   Lymphocytes Absolute 2.7 0.7 - 3.1 x10E3/uL   Monocytes Absolute 0.7 0.1 - 0.9 x10E3/uL   EOS (ABSOLUTE) 0.5 (H) 0.0 - 0.4 x10E3/uL   Basophils Absolute 0.1 0.0 - 0.2 x10E3/uL   Immature Granulocytes 0 Not Estab. %   Immature Grans (Abs) 0.0 0.0 - 0.1 x10E3/uL    Last CBC Lab Results  Component Value Date   WBC 7.7 03/30/2024   HGB 11.2 03/30/2024   HCT 35.7 03/30/2024   MCV 87 03/30/2024   MCH 27.2 03/30/2024   RDW 13.8 03/30/2024   PLT 280 03/30/2024   Last metabolic panel Lab Results  Component Value Date   GLUCOSE 145 (H) 03/09/2024   NA 138 03/09/2024   K 4.6 03/09/2024   CL 101 03/09/2024   CO2 22 03/09/2024   BUN 13 03/09/2024   CREATININE 1.20 (H) 03/09/2024   EGFR 51 (L) 03/09/2024   CALCIUM  10.0 03/09/2024   PHOS 3.6 02/25/2023   PROT 6.7 03/09/2024   ALBUMIN 3.9 03/09/2024   LABGLOB 2.8 03/09/2024   AGRATIO 1.4 06/23/2017   BILITOT 0.3 03/09/2024   ALKPHOS 109 03/09/2024   AST 63 (H) 03/09/2024   ALT 59 (H) 03/09/2024   ANIONGAP 11 01/13/2018   Last hemoglobin A1c Lab Results  Component Value Date   HGBA1C 7.2 (A) 03/09/2024      The ASCVD Risk score (Arnett DK, et al., 2019) failed to calculate for the following reasons:   The valid total cholesterol range is 130 to 320 mg/dL  Assessment & Plan:   Assessment & Plan Postmenopausal bleeding Last Pap smear in 2022 normal and HPV negative.  Due for next pap in 2027.  Does not appear to have a history of abnormal Pap  smears or a history of blood dyscrasias.  Had a transvaginal ultrasound in 2013 that was normal. Given her benign external genitalia exam and negative review of systems aside from an isolated episode of vaginal bleeding, differential includes structural causes as well as nonstructural causes. Patient has lost over 40 pounds in the last 4 to 5 years, she was previously 240lbs. It is possible that she has endometrial hyperplasia in the setting of excess estrogen.  She could also have a polyp or leiomyoma that is presenting itself with bleeding. Given menopausal status less likely ovulatory dysfunction.  With her benign exam and hemodynamic stability, less likely infection.  She has not had any recent procedures, less likely iatrogenic.  She had most labs done pretty recently but we will check a CBC today to ensure her hemoglobin is stable.  Will order transvaginal ultrasound.  She says that she does not have an OB/GYN and that all of her care is through the internal medicine clinic.  Depending on the results of the transvaginal ultrasound, may need to refer to OB/GYN.  Discussed possibilities with patient, she is agreeable and understands. Plan CBC Transvaginal ultrasound Orders:   CBC with Diff   US  Pelvic Complete With Transvaginal; Future   Return if symptoms worsen or fail to improve.    Viktoria King, DO "

## 2024-03-30 NOTE — Patient Instructions (Addendum)
 It was wonderful seeing you today!   Please remember to...  1) Be on the look out for someone to call you to schedule the ultrasound to further evaluate the bleeding   2) I will be in touch with you about the results of your blood work and ultrasound to discuss next steps  Congratulations on your weight loss and blood pressure improvements.   If you have any questions please feel free to the call the clinic at anytime at [phone].  Have a blessed day,  Dr. Charmayne

## 2024-03-30 NOTE — Progress Notes (Signed)
 Internal Medicine Clinic Attending  Case discussed with the resident at the time of the visit.  We reviewed the resident's history and exam and pertinent patient test results.  I agree with the assessment, diagnosis, and plan of care documented in the resident's note.

## 2024-03-31 ENCOUNTER — Other Ambulatory Visit: Payer: Self-pay

## 2024-03-31 ENCOUNTER — Ambulatory Visit: Payer: Self-pay

## 2024-03-31 DIAGNOSIS — Z7984 Long term (current) use of oral hypoglycemic drugs: Secondary | ICD-10-CM

## 2024-03-31 DIAGNOSIS — K219 Gastro-esophageal reflux disease without esophagitis: Secondary | ICD-10-CM

## 2024-03-31 DIAGNOSIS — N950 Postmenopausal bleeding: Secondary | ICD-10-CM

## 2024-03-31 DIAGNOSIS — E1121 Type 2 diabetes mellitus with diabetic nephropathy: Secondary | ICD-10-CM

## 2024-03-31 DIAGNOSIS — E119 Type 2 diabetes mellitus without complications: Secondary | ICD-10-CM

## 2024-03-31 DIAGNOSIS — Z794 Long term (current) use of insulin: Secondary | ICD-10-CM

## 2024-03-31 LAB — CBC WITH DIFFERENTIAL/PLATELET
Basophils Absolute: 0.1 x10E3/uL (ref 0.0–0.2)
Basos: 1 %
EOS (ABSOLUTE): 0.5 x10E3/uL — ABNORMAL HIGH (ref 0.0–0.4)
Eos: 7 %
Hematocrit: 35.7 % (ref 34.0–46.6)
Hemoglobin: 11.2 g/dL (ref 11.1–15.9)
Immature Grans (Abs): 0 x10E3/uL (ref 0.0–0.1)
Immature Granulocytes: 0 %
Lymphocytes Absolute: 2.7 x10E3/uL (ref 0.7–3.1)
Lymphs: 35 %
MCH: 27.2 pg (ref 26.6–33.0)
MCHC: 31.4 g/dL — ABNORMAL LOW (ref 31.5–35.7)
MCV: 87 fL (ref 79–97)
Monocytes Absolute: 0.7 x10E3/uL (ref 0.1–0.9)
Monocytes: 9 %
Neutrophils Absolute: 3.7 x10E3/uL (ref 1.4–7.0)
Neutrophils: 48 %
Platelets: 280 x10E3/uL (ref 150–450)
RBC: 4.12 x10E6/uL (ref 3.77–5.28)
RDW: 13.8 % (ref 11.7–15.4)
WBC: 7.7 x10E3/uL (ref 3.4–10.8)

## 2024-03-31 NOTE — Progress Notes (Signed)
 "  04/01/2024 Name: Ashley Long MRN: [id number] DOB: 30-Aug-1960  Chief Complaint  Patient presents with   Diabetes    Ashley Long is a 64 y.o. year old female who was referred for medication management by their primary care provider, Elnora Ip, MD. They presented for a telephone visit today (originally scheduled as a face-to-face visit, but was switched by patient).   They were referred to the pharmacist by their PCP for assistance in managing diabetes . PMH includes HTN, PAD, GERD, T2DM, CKD3, HLD.    Subjective: Patient was seen by PCP, Dr. Kristy on 03/09/24. A1C improved from 7.4% to 7.2%. Through previous conversations, patient had held off on starting Ozempic  due to low magnesium  and ongoing nausea/vomiting. She continued Janumet , Invokana , and basal/bolus insulin . She had a magnesium  infusion in Nov 2025 and Mg improved from 1.2 mg/dL to 1.6 mg/dL. Dr. Kristy recommended starting Ozempic  if Mg was > 2.0 mg/dL. At pharmacy call on 03/15/24, patient reported she had not had any nausea/vomiting in about 3 weeks.  Today, patient reports doing well. States she did have nausea/vomiting 2 days ago. She continues to have symptoms of GERD with frequent throat clearing and belching apparent during conversation today. She provided her BG log for review. Patient feels fluctuation is due to diet, but majority of values are at goal. She is only taking 1 units of Humalog  with dinner. She states she has good supplies of Invokana  and Janumet  remaining and has not had issues continuing to use  [facility].   Care Team: Primary Care Provider: Elnora Ip, MD ; Next Scheduled Visit: needs to be scheduled ~06/07/24  Medication Access/Adherence  Current Pharmacy:  [facility] [place], [place] - [address] [place] [address] [place] [address] Phone: [phone] Fax: [phone]  [place] [facility] [address], [address] [place] [address] Phone: [phone] Fax: [phone]  [facility] [address] ([place]), [place] - [address] [place]) [place] [address] Phone: [phone] Fax: [phone]  [facility] - [facility] [address] [place] [address] Phone: [phone] Fax: [phone]  [facility] [place], [place] - [address] [place] [address] Phone: [phone] Fax: [phone]   Patient reports affordability concerns with their medications: Yes  - uninsured, uses [facility] and [facility]. [employer] approved through Feb 2026 (Ozempic ). Patient reports access/transportation concerns to their pharmacy: No  Patient reports adherence concerns with their medications:  No     Diabetes:  Current medications: Lantus  24 units daily (at bedtime), Humalog  2-3 units with breakfast: 2 units with lunch - 1-2 units with dinner, Janumet  50-1000 mg BID, Invokana  100 mg daily  Current glucose readings:       Patient reports hypoglycemic s/sx including just feeling a little off - denies being shaky or sweaty with some afternoon BG <70 mg/dL. This has not happened in ~3 weeks.  Patient denies hyperglycemic symptoms including polyuria, polydipsia, polyphagia, nocturia, neuropathy, blurred vision.  Current meal patterns: 3 meals/day - tries to eat protein with each. Appetite is pretty normal. Tries to avoid large servings of carbohydrates. - Drinks: water throughout the day - Dinner - last night had a tuna sandwich  Current physical activity: did not discuss today  Macrovascular and Microvascular Risk Reduction:  Statin? yes (atorvastatin  40 mg daily); ACEi/ARB? yes (amlodipine -olmesartan  10-20 mg daily) Last urinary albumin/creatinine  ratio:  Lab Results  Component Value Date   MICRALBCREAT 52 (H) 09/01/2023   MICRALBCREAT 69 (H) 08/13/2022    MICRALBCREAT 44 (H) 01/29/2021   MICRALBCREAT 33.2 (H) 01/21/2018   MICRALBCREAT 39.3 (H) 09/09/2017   MICRALBCREAT 322.1 (H) 07/19/2014   MICRALBCREAT 59.5 (H) 11/10/2011   MICRALBCREAT 65.1 (H) 03/06/2009   MICRALBCREAT 203.2 (H) 01/05/2008   Last eye exam:  Lab Results  Component Value Date   HMDIABEYEEXA Retinopathy (A) 05/13/2021   Last foot exam: 05/26/2023 Tobacco Use:  Tobacco Use: Medium Risk (03/09/2024)   Patient History    Smoking Tobacco Use: Former    Smokeless Tobacco Use: Never    Passive Exposure: Not on file     Objective:  BP Readings from Last 3 Encounters:  03/30/24 132/82  03/09/24 (!) 147/74  02/03/24 (!) 147/86    Lab Results  Component Value Date   HGBA1C 7.2 (A) 03/09/2024   HGBA1C 7.4 (A) 12/02/2023   HGBA1C 8.0 (A) 09/01/2023       Latest Ref Rng & Units 03/09/2024   10:05 AM 09/01/2023   11:07 AM 02/25/2023   11:01 AM  BMP  Glucose 70 - 99 mg/dL 854  94  886   BUN 8 - 27 mg/dL 13  19  21    Creatinine 0.57 - 1.00 mg/dL 8.79  8.81  8.87   BUN/Creat Ratio 12 - 28 11  16  19    Sodium 134 - 144 mmol/L 138  140  136   Potassium 3.5 - 5.2 mmol/L 4.6  4.1  4.3   Chloride 96 - 106 mmol/L 101  100  98   CO2 20 - 29 mmol/L 22  18  21    Calcium  8.7 - 10.3 mg/dL 89.9  89.9  9.7     Lab Results  Component Value Date   CHOL 86 (L) 12/01/2022   HDL 39 (L) 12/01/2022   LDLCALC 29 12/01/2022   TRIG 93 12/01/2022   CHOLHDL 2.2 12/01/2022    Medications Reviewed Today     Reviewed by Brinda Lorain SQUIBB, RPH-CPP (Pharmacist) on 04/01/24 at 1511  Med List Status: <None>   Medication Order Taking? Sig Documenting Provider Last Dose Status Informant  acetaminophen  (TYLENOL ) 500 MG tablet [id number]  Take 500 mg by mouth every 6 (six) hours as needed for moderate pain. [provider]  Active Self  amlodipine -olmesartan  (AZOR ) 10-20 MG tablet [id number] Yes Take 1 tablet by mouth daily Gomez-Caraballo, Maria, MD  Active   ascorbic acid  (VITAMIN C) 500 MG tablet [id number]  Take 500 mg by mouth daily. [provider]  Active Self  ASPIRIN  EC ADULT LOW DOSE 81 MG tablet [id number] Yes TAKE 1 Tablet BY MOUTH ONCE DAILY Elnora Ip, MD  Active   atorvastatin  (LIPITOR) 40 MG tablet [id number] Yes TAKE 1 Tablet BY MOUTH ONCE DAILY Gomez-Caraballo, Maria, MD  Active   Blood Glucose Monitoring Suppl (WAVESENSE PRESTO) W/DEVICE KIT [id number]  1 each by Does not apply route 2 (two) times daily. Twanna Sora, MD  Active Self  canagliflozin  (INVOKANA ) 100 MG TABS tablet [id number] Yes Take 100 mg by mouth daily before breakfast. [provider]  Active   carvedilol  (COREG ) 25 MG tablet [id number] Yes TAKE 1 Tablet  BY MOUTH TWICE DAILY Elnora Ip, MD  Active   chlorthalidone  (HYGROTON ) 25 MG tablet [id number] Yes Take 1 tablet (25 mg total) by mouth daily Gomez-Caraballo, Maria, MD  Active   colchicine  0.6 MG tablet [id number]  Take 2  tablets initially, then 0.6 mg orally one hour later on day 1; then 0.6 mg orally once or twice daily starting on day 2, for a total of 7 to 10 days or until symptoms improve Nooruddin, Saad, MD  Active   glucose blood (WAVESENSE PRESTO) test strip [id number]  Use to test blood sugar 3 times daily. diag code E11.9. Insulin  dependent Burns, Alexa R, MD  Active Self  HYDROcodone -acetaminophen  (NORCO/VICODIN) 5-325 MG tablet [id number]  Take 1 tablet by mouth every 4 (four) hours as needed. Tegeler, Lonni PARAS, MD  Active   Insulin  Glargine (BASAGLAR  [facility]) 100 UNIT/ML [id number] Yes Inject 22 Units into the skin daily. Gomez-Caraballo, Maria, MD  Active   insulin  lispro (HUMALOG  KWIKPEN) 100 UNIT/ML KwikPen [id number] Yes INJECT 3 UNITS UNDER THE SKIN BEFORE BREAKFAST AND LUNCH AND 2 UNITS BEFORE JOAQUIN Elnora Ip, MD  Active   Insulin  Pen Needle (PEN NEEDLES) 31G X 5 MM MISC [id number]  4 Doses/Fill by Does not apply route daily. Arminda Daved HERO, MD  Active Self   Insulin  Syringe-Needle U-100 31G X 15/64 0.3 ML MISC [id number]  1 each by Does not apply route 2 (two) times daily with a meal. Arminda Daved HERO, MD  Active Self    Discontinued 04/01/24 0901 (Change in therapy)   Lancets 30G MISC [id number]  1 each by Does not apply route 2 (two) times daily. Twanna Sora, MD  Active Self  magnesium  oxide (MAG-OX) 400 MG tablet [id number]  Take 400 mg by mouth daily. [provider]  Active Self    Discontinued 04/01/24 0901 (Change in therapy)   Polyethyl Glycol-Propyl Glycol (SYSTANE OP) [id number]  Place 1 drop into both eyes daily as needed (irritation). [provider]  Active Self  polyethylene glycol (MIRALAX  / GLYCOLAX ) 17 g packet [id number]  Take 17 g by mouth daily as needed for moderate constipation. [provider]  Active Self   Patient not taking:   Discontinued 04/01/24 0901 (Change in therapy)   sitaGLIPtin -metformin  (JANUMET ) 50-1000 MG tablet [id number] Yes Take 1 tablet by mouth 2 (two) times daily with a meal. [provider]  Active   valACYclovir (VALTREX) 500 MG tablet [id number]  Take 500 mg by mouth 2 (two) times daily as needed (fever blisters). [provider]  Active Self              Assessment/Plan:   Diabetes: - Currently uncontrolled with most recent A1C of 7.2% above goal <7%, but slightly improved from previous. Given ongoing symptomatic GERD, episode of n/v 2 days ago, and Mg < 2.0 mg/dL, do not feel patient is a good candidate for GLP-1RA at this time. Majority of PPG are at goal on current regimen, with occasional increased FBG. Patient is only taking 1 unit of insulin  with dinner. Discuss that she could increase to 2 units with dinner when she is having a meal that has more carbohydrates (bread, rice, pasta, potatoes, etc). She is in agreement with this plan. Most recent eGFR was 51 mL/min. If drops < 45 mL/min, will need to reduce dose of Janumet  (sitagliptin -metformin ) to  50-1000 mg daily for dose-adjustments for metformin  and sitagliptin . - Last UACR June 2025 - 52 mg/g - Reviewed long term cardiovascular and renal outcomes of uncontrolled blood sugar - Reviewed goal A1c, goal fasting, and goal 2 hour post prandial glucose - Reviewed hypoglycemia management plan and the rule of 15 - Reviewed dietary modifications including  utilizing the healthy plate method, limiting  portion size of carbohydrate foods, increasing intake of protein and non-starchy vegetables. Counseled patient to stay hydrated with water throughout the day. - Reviewed lifestyle modifications including: aiming for 150 minutes of moderate intensity exercise every week.  - Recommend to continue Janumet  50-1000 mg BID  - Recommend to continue Invokana  100 mg daily. Consider increasing to 300 mg daily in the future. This is prescribed by nephrology. - Recommend to continue Lantus  to 22 units daily. May decrease to 20 units daily if she has recurrent hypoglycemia.  - Recommend to continue Humalog  3 units with breakfast, 2-3 units with lunch. Advised to increase to 2 units with dinner if she is having a meal higher in carbohydrates. - Recommend to check glucose TID Counseled patient to bring glucometer or BG log to every appointment. - Next A1C due 06/07/24   Written patient instructions provided. Patient verbalized understanding of treatment plan.   Follow Up Plan:  Pharmacist telephone 02/15/24 PCP clinic visit 03/09/24   Lorain Baseman, PharmD [facility] [phone]   "

## 2024-04-07 ENCOUNTER — Other Ambulatory Visit: Payer: Self-pay

## 2024-04-07 ENCOUNTER — Telehealth: Payer: Self-pay | Admitting: *Deleted

## 2024-04-07 MED ORDER — TRUEPLUS LANCETS 30G MISC
3 refills | Status: AC
  Filled 2024-04-07: qty 100, 33d supply, fill #0

## 2024-04-07 MED ORDER — TRUE METRIX BLOOD GLUCOSE TEST VI STRP
ORAL_STRIP | 12 refills | Status: AC
  Filled 2024-04-07: qty 100, 33d supply, fill #0

## 2024-04-07 MED ORDER — TRUE METRIX METER W/DEVICE KIT
PACK | 0 refills | Status: AC
  Filled 2024-04-07: qty 1, 365d supply, fill #0

## 2024-04-07 MED ORDER — TRUEDRAW LANCING DEVICE MISC
0 refills | Status: AC
  Filled 2024-04-07: qty 1, 1d supply, fill #0

## 2024-04-07 NOTE — Telephone Encounter (Signed)
 Called PT LVM TO CALL BACK.  Copied from CRM #[id number]. Topic: Appointments - Scheduling Inquiry for Clinic >> Apr 07, 2024  8:35 AM Alfonso ORN wrote: Reason for CRM:  patient request to would like to  speak with [place] regarding an appt.  Patient would not give agent details ,agent offer to assist with appointment needs   ----------------------------------------------------------------------- From previous Reason for Contact - Other: Reason for CRM:

## 2024-04-07 NOTE — Telephone Encounter (Signed)
 Returned call to patient No answer Message left on recorder

## 2024-04-07 NOTE — Telephone Encounter (Signed)
 Call to patient .  Message left that Clinics had returned her call. Copied from CRM [id number]. Topic: Clinical - Medication Question >> Apr 07, 2024  8:34 AM Al[place]so ORN wrote: Reason for CRM: patient ask to speak with [place] regarding a meter

## 2024-04-08 ENCOUNTER — Telehealth: Payer: Self-pay

## 2024-04-08 NOTE — Telephone Encounter (Signed)
-----   Message from Hadassah Ala sent at 04/07/2024 10:59 AM EST ----- Perfect. Thank you for letting us  know.   I am cc'ing Ms. Everette as we will need to cancel the assistance for Ozempic  and continue with the rest of her assistance programs.   Thank you all! ----- Message ----- From: Brinda Lorain SQUIBB, RPH-CPP Sent: 04/01/2024   3:13 PM EST To: [place] JAYSON Eastern, DO; Hadassah Ala, MD  Spoke with patient yesterday (she switched from in person to telephone visit). Determined she is likely not a good candidate for Ozempic  at this time, as she continues to have n/v (last episode 2 days ago) and symptomatic GERD with frequent throat clearing and belching. Her BG are fairly well controlled on current regimen. We discussed increasing dinner time Humalog  from 1 to 2 units if she has a meal higher in carbohydrates. I will follow-up with her in about 1 mo before next PCP appt. Let me know if you have questions/concerns.   Thank you!  Lorain Brinda, PharmD [facility] [phone]

## 2024-04-08 NOTE — Progress Notes (Signed)
 Internal Medicine Clinic Attending  Case discussed with the resident at the time of the visit.  We reviewed the resident's history and exam and pertinent patient test results.  I agree with the assessment, diagnosis, and plan of care documented in the resident's note.

## 2024-04-08 NOTE — Telephone Encounter (Signed)
 No further assistance or refills needed Ozempic .  Current enrollment ends 05/08/24.

## 2024-04-11 ENCOUNTER — Other Ambulatory Visit

## 2024-04-12 ENCOUNTER — Other Ambulatory Visit: Payer: Self-pay

## 2024-04-13 ENCOUNTER — Other Ambulatory Visit: Payer: Self-pay

## 2024-04-15 ENCOUNTER — Ambulatory Visit: Admission: RE | Admit: 2024-04-15 | Discharge: 2024-04-15 | Disposition: A | Source: Ambulatory Visit

## 2024-04-15 DIAGNOSIS — N950 Postmenopausal bleeding: Secondary | ICD-10-CM

## 2024-05-05 ENCOUNTER — Other Ambulatory Visit: Payer: Self-pay

## 2024-06-16 ENCOUNTER — Encounter: Payer: Self-pay | Admitting: Obstetrics and Gynecology

## 2024-07-18 ENCOUNTER — Ambulatory Visit: Payer: Self-pay | Admitting: Student
